# Patient Record
Sex: Female | Born: 1955
Health system: Southern US, Community
[De-identification: ages and names within clinical notes are randomized; demographics above are authoritative.]

## PROBLEM LIST (undated history)

## (undated) DIAGNOSIS — M329 Systemic lupus erythematosus, unspecified: Secondary | ICD-10-CM

## (undated) DIAGNOSIS — K219 Gastro-esophageal reflux disease without esophagitis: Secondary | ICD-10-CM

## (undated) DIAGNOSIS — Z8669 Personal history of other diseases of the nervous system and sense organs: Secondary | ICD-10-CM

## (undated) DIAGNOSIS — T753XXA Motion sickness, initial encounter: Secondary | ICD-10-CM

## (undated) DIAGNOSIS — K754 Autoimmune hepatitis: Secondary | ICD-10-CM

## (undated) DIAGNOSIS — E559 Vitamin D deficiency, unspecified: Secondary | ICD-10-CM

## (undated) DIAGNOSIS — M1990 Unspecified osteoarthritis, unspecified site: Secondary | ICD-10-CM

## (undated) DIAGNOSIS — N938 Other specified abnormal uterine and vaginal bleeding: Secondary | ICD-10-CM

## (undated) DIAGNOSIS — J45909 Unspecified asthma, uncomplicated: Secondary | ICD-10-CM

## (undated) DIAGNOSIS — R001 Bradycardia, unspecified: Secondary | ICD-10-CM

## (undated) DIAGNOSIS — F32A Depression, unspecified: Secondary | ICD-10-CM

## (undated) DIAGNOSIS — E119 Type 2 diabetes mellitus without complications: Secondary | ICD-10-CM

## (undated) DIAGNOSIS — K8020 Calculus of gallbladder without cholecystitis without obstruction: Secondary | ICD-10-CM

## (undated) DIAGNOSIS — Z8619 Personal history of other infectious and parasitic diseases: Secondary | ICD-10-CM

## (undated) DIAGNOSIS — K769 Liver disease, unspecified: Secondary | ICD-10-CM

## (undated) DIAGNOSIS — Z201 Contact with and (suspected) exposure to tuberculosis: Secondary | ICD-10-CM

## (undated) DIAGNOSIS — F329 Major depressive disorder, single episode, unspecified: Secondary | ICD-10-CM

## (undated) DIAGNOSIS — I420 Dilated cardiomyopathy: Secondary | ICD-10-CM

## (undated) DIAGNOSIS — K259 Gastric ulcer, unspecified as acute or chronic, without hemorrhage or perforation: Secondary | ICD-10-CM

## (undated) DIAGNOSIS — I5020 Unspecified systolic (congestive) heart failure: Secondary | ICD-10-CM

## (undated) DIAGNOSIS — I4891 Unspecified atrial fibrillation: Secondary | ICD-10-CM

## (undated) HISTORY — DX: Dilated cardiomyopathy: I42.0

## (undated) HISTORY — DX: Major depressive disorder, single episode, unspecified: F32.9

## (undated) HISTORY — DX: Unspecified asthma, uncomplicated: J45.909

## (undated) HISTORY — DX: Unspecified osteoarthritis, unspecified site: M19.90

## (undated) HISTORY — DX: Unspecified systolic (congestive) heart failure: I50.20

## (undated) HISTORY — DX: Depression, unspecified: F32.A

## (undated) HISTORY — DX: Bradycardia, unspecified: R00.1

## (undated) HISTORY — DX: Gastro-esophageal reflux disease without esophagitis: K21.9

## (undated) HISTORY — DX: Personal history of other infectious and parasitic diseases: Z86.19

## (undated) HISTORY — DX: Type 2 diabetes mellitus without complications: E11.9

## (undated) HISTORY — DX: Liver disease, unspecified: K76.9

## (undated) HISTORY — DX: Calculus of gallbladder without cholecystitis without obstruction: K80.20

## (undated) HISTORY — DX: Unspecified atrial fibrillation: I48.91

## (undated) HISTORY — PX: BACK SURGERY: SHX140

## (undated) HISTORY — DX: Contact with and (suspected) exposure to tuberculosis: Z20.1

## (undated) HISTORY — DX: Personal history of other diseases of the nervous system and sense organs: Z86.69

## (undated) HISTORY — DX: Vitamin D deficiency, unspecified: E55.9

## (undated) HISTORY — DX: Other specified abnormal uterine and vaginal bleeding: N93.8

## (undated) HISTORY — DX: Gastric ulcer, unspecified as acute or chronic, without hemorrhage or perforation: K25.9

## (undated) HISTORY — DX: Autoimmune hepatitis: K75.4

## (undated) HISTORY — DX: Systemic lupus erythematosus, unspecified: M32.9

## (undated) HISTORY — PX: ABDOMINAL HYSTERECTOMY: SHX81

## (undated) HISTORY — PX: LAPAROSCOPIC HYSTERECTOMY: SHX1926

## (undated) HISTORY — PX: TUBAL LIGATION: SHX77

---

## 2003-05-13 DIAGNOSIS — Z201 Contact with and (suspected) exposure to tuberculosis: Secondary | ICD-10-CM

## 2003-05-13 HISTORY — DX: Contact with and (suspected) exposure to tuberculosis: Z20.1

## 2008-10-31 ENCOUNTER — Ambulatory Visit: Payer: Self-pay | Admitting: Gastroenterology

## 2009-05-26 ENCOUNTER — Ambulatory Visit: Payer: Self-pay | Admitting: Internal Medicine

## 2009-06-21 ENCOUNTER — Ambulatory Visit: Payer: Self-pay | Admitting: Internal Medicine

## 2009-11-22 ENCOUNTER — Encounter: Payer: Self-pay | Admitting: Family Medicine

## 2009-12-10 ENCOUNTER — Encounter: Payer: Self-pay | Admitting: Family Medicine

## 2010-03-19 DIAGNOSIS — K754 Autoimmune hepatitis: Secondary | ICD-10-CM | POA: Insufficient documentation

## 2010-03-19 LAB — HM HEPATITIS C SCREENING LAB: HM Hepatitis Screen: NEGATIVE

## 2010-05-06 ENCOUNTER — Emergency Department: Payer: Self-pay | Admitting: Emergency Medicine

## 2010-05-07 DIAGNOSIS — K7682 Hepatic encephalopathy: Secondary | ICD-10-CM | POA: Insufficient documentation

## 2010-05-22 ENCOUNTER — Ambulatory Visit: Admit: 2010-05-22 | Payer: Self-pay | Admitting: Family Medicine

## 2010-05-31 ENCOUNTER — Ambulatory Visit
Admission: RE | Admit: 2010-05-31 | Discharge: 2010-05-31 | Payer: Self-pay | Source: Home / Self Care | Attending: Family Medicine | Admitting: Family Medicine

## 2010-05-31 ENCOUNTER — Encounter: Payer: Self-pay | Admitting: Family Medicine

## 2010-05-31 DIAGNOSIS — K754 Autoimmune hepatitis: Secondary | ICD-10-CM | POA: Insufficient documentation

## 2010-05-31 DIAGNOSIS — E169 Disorder of pancreatic internal secretion, unspecified: Secondary | ICD-10-CM | POA: Insufficient documentation

## 2010-05-31 DIAGNOSIS — M329 Systemic lupus erythematosus, unspecified: Secondary | ICD-10-CM | POA: Insufficient documentation

## 2010-05-31 DIAGNOSIS — E559 Vitamin D deficiency, unspecified: Secondary | ICD-10-CM | POA: Insufficient documentation

## 2010-05-31 DIAGNOSIS — E099 Drug or chemical induced diabetes mellitus without complications: Secondary | ICD-10-CM | POA: Insufficient documentation

## 2010-05-31 DIAGNOSIS — T380X5A Adverse effect of glucocorticoids and synthetic analogues, initial encounter: Secondary | ICD-10-CM

## 2010-05-31 HISTORY — DX: Systemic lupus erythematosus, unspecified: M32.9

## 2010-06-03 LAB — CONVERTED CEMR LAB
ALT: 32 units/L (ref 0–35)
AST: 35 units/L (ref 0–37)
Albumin: 4 g/dL (ref 3.5–5.2)
Alkaline Phosphatase: 59 units/L (ref 39–117)
BUN: 14 mg/dL (ref 6–23)
Bilirubin, Direct: 0.1 mg/dL (ref 0.0–0.3)
CO2: 25 meq/L (ref 19–32)
Calcium: 9.5 mg/dL (ref 8.4–10.5)
Chloride: 103 meq/L (ref 96–112)
Creatinine, Ser: 0.69 mg/dL (ref 0.40–1.20)
Glucose, Bld: 123 mg/dL — ABNORMAL HIGH (ref 70–99)
Hgb A1c MFr Bld: 10.9 % — ABNORMAL HIGH (ref <5.7)
Indirect Bilirubin: 0.4 mg/dL (ref 0.0–0.9)
Potassium: 3.8 meq/L (ref 3.5–5.3)
Sodium: 139 meq/L (ref 135–145)
Total Bilirubin: 0.5 mg/dL (ref 0.3–1.2)
Total Protein: 7.8 g/dL (ref 6.0–8.3)
Vit D, 25-Hydroxy: 27 ng/mL — ABNORMAL LOW (ref 30–89)

## 2010-06-07 ENCOUNTER — Encounter (INDEPENDENT_AMBULATORY_CARE_PROVIDER_SITE_OTHER): Payer: Self-pay | Admitting: *Deleted

## 2010-06-13 NOTE — Miscellaneous (Signed)
Summary: med list update- test strips  Clinical Lists Changes  Medications: Added new medication of BAYER CONTOUR TEST  STRP (GLUCOSE BLOOD) check blood sugar twice a day     Prior Medications: IMURAN 50 MG TABS (AZATHIOPRINE) 200mg  daily ALDACTONE 50 MG TABS (SPIRONOLACTONE) take one tablet by mouth daily NEOMYCIN SULFATE 500 MG TABS (NEOMYCIN SULFATE) take one tablet by mouth daily LACTULOSE 20 GM/30ML SOLN (LACTULOSE) take four times daily NEXIUM 20 MG CPDR (ESOMEPRAZOLE MAGNESIUM) take one tablet by mouth daily LANTUS 100 UNIT/ML SOLN (INSULIN GLARGINE) 22 units every morning sliding scale  150-200     0 units 201-250     2 units 251-300     6 units 301-350     8 units 351-400     10 units 401 and above    Call MD PREDNISONE 10 MG TABS (PREDNISONE) 1 tab by mouth daily. VITAMIN D3 50000 UNIT CAPS (CHOLECALCIFEROL) take one tablet by mouth once a week for six weeks BAYER CONTOUR TEST  STRP (GLUCOSE BLOOD) check blood sugar twice a day Current Allergies: No known allergies

## 2010-06-13 NOTE — Assessment & Plan Note (Signed)
Summary: NEW PT TO BE ESTABLISHED/JRR   Vital Signs:  Patient profile:   55 year old female Height:      66 inches Weight:      194.50 pounds BMI:     31.51 Temp:     99.2 degrees F oral Pulse rate:   78 / minute Pulse rhythm:   regular BP sitting:   110 / 70  (right arm) Cuff size:   regular  Vitals Entered By: Linde Gillis CMA Duncan Dull) (May 31, 2010 2:48 PM) CC: new patient, establish care   History of Present Illness: 55 yo with complicated medical history here to establish care.  1.  Autoimmune hepatitis- diagnosed 12 years ago, followed by [facility] liver clinic.  On transplant list. Per pt, liver function has been deteriorating so her dose of Imuran was recently increased to 200 mg daily, also taking Aldoactone 50 mg daily, Lactulose and now on Prednisone 10 mg daily which has caused steroid induced DM.  2.  Several weeks ago was at work, increased urination, thrush in her mouth and increased confusion.  CBG was greater than 500.  Went to [facility] hospitals and diagnosed with steroid induced DM. Currently on 22 units of Lantus every morning and SSI. CBGs have been under 150 for past several days and she feels much better.  3.  Vitamin d defiency- recently completed one month of high dose Vit D, feels like energy prior to the DM was better on Vitamin D.  Due for labs.  Preventive Screening-Counseling & Management  Alcohol-Tobacco     Smoking Status: current      Drug Use:  no.    Current Medications (verified): 1)  Imuran 50 Mg Tabs (Azathioprine) .... 200mg  Daily 2)  Aldactone 50 Mg Tabs (Spironolactone) .... Take One Tablet By Mouth Daily 3)  Neomycin Sulfate 500 Mg Tabs (Neomycin Sulfate) .... Take One Tablet By Mouth Daily 4)  Lactulose 20 Gm/109ml Soln (Lactulose) .... Take Four Times Daily 5)  Nexium 20 Mg Cpdr (Esomeprazole Magnesium) .... Take One Tablet By Mouth Daily 6)  Lantus 100 Unit/ml Soln (Insulin Glargine) .... 22 Units Every Morning Sliding Scale  150-200      0 Units 201-250     2 Units 251-300     6 Units 301-350     8 Units 351-400     10 Units 401 and Above    Call Md 7)  Prednisone 10 Mg Tabs (Prednisone) .Marland Kitchen.. 1 Tab By Mouth Daily.  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 Brooke Dare at [facility] is hepatologist)- on transplant list. 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 Eyes:  Denies blurring. ENT:  Denies difficulty swallowing. CV:  Denies chest pain or discomfort. Resp:  Denies shortness of breath. GI:  Denies abdominal pain and change in bowel habits. GU:  Complains of urinary frequency. MS:  Complains of joint pain. Derm:  Denies rash. Neuro:  Denies headaches and seizures. Psych:  Denies anxiety and depression. Endo:  Denies cold intolerance and heat intolerance. Heme:  Denies abnormal bruising and bleeding.   Impression & Recommendations:  Problem # 1:  DIABETES MELLITUS, STEROID-INDUCED (ICD-251.8) Assessment New CBGs appear under better control.  Will check an a1c today. Orders: T- Hemoglobin A1C ([id number])  Problem # 2:  AUTOIMMUNE HEPATITIS (ICD-571.42) Assessment: Deteriorated Very complicated situation.  We would ultimately like to decrease her IMuran and get her off the prednisone so she can reverse her DM. Will order labs per pt request and send to her hepatologist.  Also awaiting records from  them. Orders: Venipuncture (21308) Specimen Handling (65784) T-Comprehensive Metabolic Panel ([id number]) T-Hepatic Function (32440-10272)  Problem # 3:  VITAMIN D DEFICIENCY (ICD-268.9) Assessment: Unchanged recheck Vit D today. Orders: T- * Misc. Laboratory test ([phone])  Complete Medication List: 1)  Imuran 50 Mg Tabs (Azathioprine) .... 200mg  daily 2)  Aldactone 50 Mg Tabs (Spironolactone) .... Take one tablet by mouth daily 3)  Neomycin Sulfate 500 Mg Tabs (Neomycin sulfate) .... Take one tablet by mouth daily 4)  Lactulose 20 Gm/12ml Soln (Lactulose) .... Take four times daily 5)  Nexium 20 Mg Cpdr (Esomeprazole magnesium) .... Take one tablet by mouth daily 6)  Lantus 100 Unit/ml Soln (Insulin glargine) .... 22 units every morning sliding scale  150-200     0 units 201-250     2 units 251-300     6 units 301-350     8 units 351-400     10 units 401 and above    call md 7)  Prednisone 10 Mg Tabs (Prednisone) .Marland Kitchen.. 1 tab by mouth daily.   Orders Added: 1)  Venipuncture [40347] 2)  Specimen Handling [99000] 3)  T-Comprehensive Metabolic Panel [[id number]] 4)  T-Hepatic Function [80076-22960] 5)  T- Hemoglobin A1C [83036-23375] 6)  T- * Misc. Laboratory test [phone] 7)  New Patient Level IV [99204]    Current Allergies (reviewed today): No known allergies   TD Result Date:  05/16/2003 TD Result:  historical Flex Sig Next Due:  Not Indicated Colonoscopy Result Date:  05/23/2008 Colonoscopy Result:  historical Hemoccult Next Due:  Not Indicated

## 2010-07-03 ENCOUNTER — Ambulatory Visit (INDEPENDENT_AMBULATORY_CARE_PROVIDER_SITE_OTHER): Payer: PRIVATE HEALTH INSURANCE | Admitting: Family Medicine

## 2010-07-03 ENCOUNTER — Encounter: Payer: Self-pay | Admitting: Family Medicine

## 2010-07-03 DIAGNOSIS — J0110 Acute frontal sinusitis, unspecified: Secondary | ICD-10-CM | POA: Insufficient documentation

## 2010-07-09 NOTE — Assessment & Plan Note (Signed)
Summary: ? SINUS INFECTION   Vital Signs:  Patient profile:   55 year old female Height:      66 inches Weight:      196.75 pounds BMI:     31.87 Temp:     98.9 degrees F oral Pulse rate:   85 / minute Pulse rhythm:   regular BP sitting:   120 / 80  (right arm) Cuff size:   regular  Vitals Entered By: Linde Gillis CMA Duncan Dull) (July 03, 2010 11:37 AM) CC: ? sinus infection   History of Present Illness: 55 yo with complicated medical history here for ?sinus infection.  H/o Autoimmune hepatitis- diagnosed 12 years ago, followed by [facility] liver clinic.  On transplant list. Per pt, liver function has been deteriorating so her dose of Imuran was recently increased to 200 mg daily, also taking Aldoactone 50 mg daily, Lactulose and prednisone recently decreased to 7.5 mg daily.    Steroid induced DM, has not need insulin in 5 days.  For past week, increasing URI symptoms.  Started with runny nose, dry cough.  Taking OTC decongestants.  Now has sinus pressure, chills, malaise. No fever.  Current Medications (verified): 1)  Imuran 50 Mg Tabs (Azathioprine) .... 200mg  Daily 2)  Aldactone 50 Mg Tabs (Spironolactone) .... Take One Tablet By Mouth Daily 3)  Neomycin Sulfate 500 Mg Tabs (Neomycin Sulfate) .... Take One Tablet By Mouth Daily 4)  Lactulose 20 Gm/75ml Soln (Lactulose) .... Take Four Times Daily 5)  Lantus 100 Unit/ml Soln (Insulin Glargine) .... 22 Units Every Morning Sliding Scale  150-200     0 Units 201-250     2 Units 251-300     6 Units 301-350     8 Units 351-400     10 Units 401 and Above    Call Md 6)  Prednisone 5 Mg Tabs (Prednisone) .... Take 7.5mg  Daily 7)  Vitamin D3 50000 Unit Caps (Cholecalciferol) .... Take One Tablet By Mouth Once A Week For Six Weeks 8)  Bayer Contour Test  Strp (Glucose Blood) .... Check Blood Sugar Twice A Day 9)  Prednisone 2.5 Mg Tabs (Prednisone) .... Take 7.5mg  Daily 10)  Ativan 0.5 Mg Tabs (Lorazepam) .... 1/2 Tab By Mouth Three Times  A Day As Needed Anxiety 11)  Azithromycin 250 Mg  Tabs (Azithromycin) .... 2 By  Mouth Today and Then 1 Daily For 4 Days  Allergies (verified): No Known Drug Allergies  Past History:  Past Medical History: Last updated: 05/31/2010 autoimmune hepatitis ( Dr. Brooke [facility]are at [facility] is hepatologist)- on transplant list. 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 Eyes:  Denies blurring. ENT:  Complains of nasal congestion, sinus pressure, and sore throat. CV:  Denies chest pain or discomfort. Resp:  Complains of cough; denies shortness of breath, sputum productive, and wheezing.  Physical Exam  General:  alert, well-developed, and well-nourished.   VSS non toxic appearing Nose:  mucosal erythema.    Mouth:  pharyngeal erythema.   +PND Lungs:  normal respiratory effort, no intercostal retractions, no accessory muscle use, and normal breath sounds.   Heart:  normal rate and regular  rhythm.   Psych:  Cognition and judgment appear intact. Alert and cooperative with normal attention span and concentration. No apparent delusions, illusions, hallucinations   Impression & Recommendations:  Problem # 1:  ACUTE FRONTAL SINUSITIS (ICD-461.1) Assessment New Given duration and progression of symptoms, will treat with Zpack for bacterial sinusitis. Discussed supportive care, limited by her liver disease. Pt knows to avoid tylenol. Her updated medication list for this problem includes:    Neomycin Sulfate 500 Mg Tabs (Neomycin sulfate) .Marland Kitchen... Take one tablet by mouth daily    Azithromycin 250 Mg  Tabs (Azithromycin) .Marland Kitchen... 2 by  mouth today and then 1 daily for 4 days  Complete Medication List: 1)  Imuran 50 Mg Tabs (Azathioprine) .... 200mg  daily 2)  Aldactone 50 Mg Tabs (Spironolactone) .... Take one tablet by mouth daily 3)  Neomycin Sulfate 500 Mg Tabs (Neomycin sulfate) .... Take one tablet by mouth daily 4)  Lactulose 20 Gm/55ml Soln (Lactulose) .... Take four times daily 5)  Lantus 100 Unit/ml Soln (Insulin glargine) .... 22 units every morning sliding scale  150-200     0 units 201-250     2 units 251-300     6 units 301-350     8 units 351-400     10 units 401 and above    call md 6)  Prednisone 5 Mg Tabs (Prednisone) .... Take 7.5mg  daily 7)  Vitamin D3 50000 Unit Caps (Cholecalciferol) .... Take one tablet by mouth once a week for six weeks 8)  Bayer Contour Test Strp (Glucose blood) .... Check blood sugar twice a day 9)  Prednisone 2.5 Mg Tabs (Prednisone) .... Take 7.5mg  daily 10)  Ativan 0.5 Mg Tabs (Lorazepam) .... 1/2 tab by mouth three times a day as needed anxiety 11)  Azithromycin 250 Mg Tabs (Azithromycin) .... 2 by  mouth today and then 1 daily for 4 days Prescriptions: AZITHROMYCIN 250 MG  TABS (AZITHROMYCIN) 2 by  mouth today and then 1 daily for 4 days  #6 x 0   Entered and Authorized by:   Ruthe Mannan MD   Signed by:   Ruthe Mannan MD on 07/03/2010   Method used:   Print then Give to Patient   RxID:   [id number] ATIVAN 0.5 MG TABS (LORAZEPAM) 1/2 tab by mouth three times a day as needed anxiety  #60 x 0   Entered and Authorized by:   Ruthe Mannan MD   Signed by:   Ruthe Mannan MD on 07/03/2010   Method used:   Print then Give to Patient   RxID:   [id number]    Orders Added: 1)  Est. Patient Level IV [01027]    Current Allergies (reviewed today): No known allergies

## 2010-08-14 ENCOUNTER — Encounter: Payer: Self-pay | Admitting: Family Medicine

## 2010-11-04 ENCOUNTER — Ambulatory Visit (INDEPENDENT_AMBULATORY_CARE_PROVIDER_SITE_OTHER): Payer: Medicaid Other | Admitting: Family Medicine

## 2010-11-04 VITALS — BP 110/72 | HR 67 | Temp 98.5°F | Wt 203.5 lb

## 2010-11-04 DIAGNOSIS — L819 Disorder of pigmentation, unspecified: Secondary | ICD-10-CM

## 2010-11-04 DIAGNOSIS — F4320 Adjustment disorder, unspecified: Secondary | ICD-10-CM | POA: Insufficient documentation

## 2010-11-04 DIAGNOSIS — K754 Autoimmune hepatitis: Secondary | ICD-10-CM

## 2010-11-04 DIAGNOSIS — Z23 Encounter for immunization: Secondary | ICD-10-CM

## 2010-11-04 HISTORY — DX: Adjustment disorder, unspecified: F43.20

## 2010-11-04 LAB — HEPATIC FUNCTION PANEL
ALT: 23 U/L (ref 0–35)
AST: 27 U/L (ref 0–37)
Albumin: 4 g/dL (ref 3.5–5.2)
Alkaline Phosphatase: 65 U/L (ref 39–117)
Bilirubin, Direct: 0.1 mg/dL (ref 0.0–0.3)
Total Bilirubin: 0.4 mg/dL (ref 0.3–1.2)
Total Protein: 7.4 g/dL (ref 6.0–8.3)

## 2010-11-04 MED ORDER — NORTRIPTYLINE HCL 10 MG PO CAPS: ORAL_CAPSULE | ORAL | Status: DC

## 2010-11-04 MED ORDER — PANTOPRAZOLE SODIUM 40 MG PO TBEC: 40.0000 mg | DELAYED_RELEASE_TABLET | Freq: Every day | ORAL | Status: DC

## 2010-11-04 MED ORDER — NORTRIPTYLINE HCL 10 MG PO CAPS: 50.0000 mg | ORAL_CAPSULE | Freq: Every day | ORAL | Status: DC

## 2010-11-04 NOTE — Progress Notes (Signed)
55 yo with complicated medical history here for:  1.  Depression- since she quit her job, she has been a little more depressed.  No SI or HI but feels low. Having problems sleeping, going through menopause. Can fall asleep but wakes up multiple times at night. Years ago, took nortriptyline, and felt it helped tremendously.  2.  Autoimmune hepatitis- diagnosed 12 years ago, followed by [facility] liver clinic.  On transplant list. Per pt, liver function has been deteriorating so her dose of Imuran was recently increased to 200 mg daily, also taking Aldoactone 50 mg daily, Lactulose and now on Prednisone 10 mg daily which has caused steroid induced DM. Now taking Prednisone 5 mg daily instead of 10 mg and liver function has been improving. Notes reviewed from [facility] hepatology clinic.   Review of Systems       See HPI Patient Active Problem List  Diagnoses  . DIABETES MELLITUS, STEROID-INDUCED  . VITAMIN D DEFICIENCY  . AUTOIMMUNE HEPATITIS  . SLE  . ACUTE FRONTAL SINUSITIS  . Hyperpigmentation   Physical exam: BP 110/72  Pulse 67  Temp(Src) 98.5 F (36.9 C) (Oral)  Wt 203 lb 8 oz (92.307 kg) Gen:  Alert, very pleasant, NAD Psych:  Good eye contact, not outward signs of anxiety or depression.  1. Autoimmune hepatitis   Stable, followed by [facility] hepatology clinic. Recheck LFTs today  2. Adjustment disorder  Discussed treatment options. She would prefer to retry nortriptyline again.  Advised that we need to watch her LFTs. The patient indicates understanding of these issues and agrees with the plan.

## 2010-11-04 NOTE — Patient Instructions (Signed)
Great to see you. Please stop by to see Cynthia on your way out.   

## 2011-01-20 ENCOUNTER — Telehealth: Payer: Self-pay | Admitting: *Deleted

## 2011-01-20 MED ORDER — AZITHROMYCIN 250 MG PO TABS: ORAL_TABLET | ORAL | Status: AC

## 2011-01-20 NOTE — Telephone Encounter (Signed)
Pt is requesting that you call her.  She says it's a personal matter and wouldn't give me any details.

## 2011-01-20 NOTE — Telephone Encounter (Signed)
URI- fever tmax 101.9. 10 days of symptoms. On pred and Imuran for autoimmune liver disease. Will start Zpack immediately. If symptoms do not improve or worsen, pt to come see me. 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

## 2011-01-31 ENCOUNTER — Ambulatory Visit: Payer: Self-pay | Admitting: Internal Medicine

## 2011-03-11 ENCOUNTER — Encounter: Payer: Self-pay | Admitting: Family Medicine

## 2011-03-11 ENCOUNTER — Ambulatory Visit (INDEPENDENT_AMBULATORY_CARE_PROVIDER_SITE_OTHER): Payer: Medicare Other | Admitting: Family Medicine

## 2011-03-11 VITALS — BP 120/82 | HR 74 | Temp 98.5°F | Ht 66.0 in | Wt 204.8 lb

## 2011-03-11 DIAGNOSIS — E168 Other specified disorders of pancreatic internal secretion: Secondary | ICD-10-CM

## 2011-03-11 DIAGNOSIS — K754 Autoimmune hepatitis: Secondary | ICD-10-CM

## 2011-03-11 DIAGNOSIS — M329 Systemic lupus erythematosus, unspecified: Secondary | ICD-10-CM

## 2011-03-11 DIAGNOSIS — Z23 Encounter for immunization: Secondary | ICD-10-CM

## 2011-03-11 DIAGNOSIS — E559 Vitamin D deficiency, unspecified: Secondary | ICD-10-CM

## 2011-03-11 LAB — BASIC METABOLIC PANEL
BUN: 12 mg/dL (ref 6–23)
CO2: 27 mEq/L (ref 19–32)
Calcium: 9.5 mg/dL (ref 8.4–10.5)
Chloride: 106 mEq/L (ref 96–112)
Creatinine, Ser: 0.8 mg/dL (ref 0.4–1.2)
GFR: 100.88 mL/min (ref >60.00)
Glucose, Bld: 95 mg/dL (ref 70–99)
Potassium: 3.8 mEq/L (ref 3.5–5.1)
Sodium: 140 mEq/L (ref 135–145)

## 2011-03-11 MED ORDER — IBUPROFEN 800 MG PO TABS: 800.0000 mg | ORAL_TABLET | Freq: Three times a day (TID) | ORAL | Status: AC | PRN

## 2011-03-11 NOTE — Progress Notes (Signed)
55 yo with complicated medical history here for:  1.  Vit D deficiency- h/o vit d deficiency in past requiring higher dose supplementation. Has been more fatigued lately with increased joint/muscle aches.  2.  Autoimmune hepatitis- diagnosed 12 years ago, followed by [facility] liver clinic.  On transplant list.  Liver function has been stable.  On Imuran  200 mg daily, also taking Aldoactone 50 mg daily, Lactulose and now on Prednisone 10 mg daily which has caused steroid induced DM. Now taking Prednisone 5 mg daily instead of 10 mg and liver function has been improving. She feels however that her confusion and encephalopathy are worsening.  Very spiritual person- ok with God's plan.  ROS: See HPI  Patient Active Problem List  Diagnoses  . DIABETES MELLITUS, STEROID-INDUCED  . VITAMIN D DEFICIENCY  . AUTOIMMUNE HEPATITIS  . SLE  . ACUTE FRONTAL SINUSITIS  . Hyperpigmentation  . Adjustment disorder   No past medical history on file. No past surgical history on file. History  Substance Use Topics  . Smoking status: Current Everyday Smoker  . Smokeless tobacco: Not on file  . Alcohol Use: Not on file   No family history on file. No Known Allergies Current Outpatient Prescriptions on File Prior to Visit  Medication Sig Dispense Refill  . azaTHIOprine (IMURAN) 50 MG tablet Take four tablets by mouth daily, 200mg        . LORazepam (ATIVAN) 0.5 MG tablet Take 1/2 tablet by mouth three times a day as needed       . neomycin (MYCIFRADIN) 500 MG tablet Take 500 mg by mouth 2 (two) times daily.        . nortriptyline (PAMELOR) 10 MG capsule Take 1 capsule at bedtime  30 capsule  3  . pantoprazole (PROTONIX) 40 MG tablet Take 1 tablet (40 mg total) by mouth daily.  30 tablet  1  . spironolactone (ALDACTONE) 50 MG tablet Take 50 mg by mouth daily.         The PMH, PSH, Social History, Family History, Medications, and allergies have been reviewed in [facility], and have been updated if  relevant.  Physical exam: BP 120/82  Pulse 74  Temp(Src) 98.5 F (36.9 C) (Oral)  Ht 5\' 6"  (1.676 m)  Wt 204 lb 12 oz (92.874 kg)  BMI 33.05 kg/m2  General:  Well-developed,well-nourished,in no acute distress; alert,appropriate and cooperative throughout examination Head:  normocephalic and atraumatic.   Eyes:  vision grossly intact, pupils equal, pupils round, and pupils reactive to light.   Ears:  R ear normal and L ear normal.   Nose:  no external deformity.   Mouth:  good dentition.   Neck:  No deformities, masses, or tenderness noted. Lungs:  Normal respiratory effort, chest expands symmetrically. Lungs are clear to auscultation, no crackles or wheezes. Heart:  Normal rate and regular rhythm. S1 and S2 normal without gallop, murmur, click, rub or other extra sounds. Abdomen:  Bowel sounds positive,abdomen soft and non-tender without masses, organomegaly or hernias noted. Msk:  No deformity or scoliosis noted of thoracic or lumbar spine.   Extremities:  No clubbing, cyanosis, edema, or deformity noted with normal full range of motion of all joints.   Neurologic:  alert & oriented X3 and gait normal.   Skin:  Intact without suspicious lesions or rashes Psych:  Cognition and judgment appear intact. Alert and cooperative with normal attention span and concentration. No apparent delusions, illusions, hallucinations  Assessment and Plan: 1. VITAMIN D DEFICIENCY  Recheck labs today. Vitamin D (25 hydroxy), Basic Metabolic Panel (BMET)  2. Autoimmune hepatitis  Liver function stable but per pt, feels encephalopathy worsening. Continue lactulose, on transplant list.    3. DIABETES MELLITUS, STEROID-INDUCED  Improved.

## 2011-03-11 NOTE — Patient Instructions (Signed)
Good to see you. Please come back for your pneumovax (nurse visit)

## 2011-03-12 LAB — VITAMIN D 25 HYDROXY (VIT D DEFICIENCY, FRACTURES): Vit D, 25-Hydroxy: 32 ng/mL (ref 30–89)

## 2011-04-12 ENCOUNTER — Other Ambulatory Visit: Payer: Self-pay | Admitting: Family Medicine

## 2011-10-08 ENCOUNTER — Ambulatory Visit (INDEPENDENT_AMBULATORY_CARE_PROVIDER_SITE_OTHER): Payer: Medicare Other | Admitting: Family Medicine

## 2011-10-08 ENCOUNTER — Encounter: Payer: Self-pay | Admitting: Family Medicine

## 2011-10-08 VITALS — BP 102/70 | HR 72 | Temp 98.1°F | Wt 207.0 lb

## 2011-10-08 DIAGNOSIS — E559 Vitamin D deficiency, unspecified: Secondary | ICD-10-CM

## 2011-10-08 DIAGNOSIS — B379 Candidiasis, unspecified: Secondary | ICD-10-CM | POA: Insufficient documentation

## 2011-10-08 DIAGNOSIS — B370 Candidal stomatitis: Secondary | ICD-10-CM

## 2011-10-08 DIAGNOSIS — K754 Autoimmune hepatitis: Secondary | ICD-10-CM

## 2011-10-08 LAB — COMPREHENSIVE METABOLIC PANEL
ALT: 23 U/L (ref 0–35)
AST: 31 U/L (ref 0–37)
Albumin: 3.9 g/dL (ref 3.5–5.2)
Alkaline Phosphatase: 66 U/L (ref 39–117)
BUN: 14 mg/dL (ref 6–23)
CO2: 29 mEq/L (ref 19–32)
Calcium: 9.5 mg/dL (ref 8.4–10.5)
Chloride: 105 mEq/L (ref 96–112)
Creatinine, Ser: 0.7 mg/dL (ref 0.4–1.2)
GFR: 120.26 mL/min (ref >60.00)
Glucose, Bld: 97 mg/dL (ref 70–99)
Potassium: 4.1 mEq/L (ref 3.5–5.1)
Sodium: 141 mEq/L (ref 135–145)
Total Bilirubin: 0.3 mg/dL (ref 0.3–1.2)
Total Protein: 7.8 g/dL (ref 6.0–8.3)

## 2011-10-08 MED ORDER — CELECOXIB 50 MG PO CAPS: 50.0000 mg | ORAL_CAPSULE | Freq: Two times a day (BID) | ORAL | Status: DC

## 2011-10-08 MED ORDER — NYSTATIN 100000 UNIT/ML MT SUSP: 500000.0000 [IU] | Freq: Four times a day (QID) | OROMUCOSAL | Status: AC

## 2011-10-08 MED ORDER — CLINDAMYCIN PHOS-BENZOYL PEROX 1-5 % EX GEL
Freq: Two times a day (BID) | CUTANEOUS | Status: DC
Start: 2011-10-08 — End: 2012-03-26

## 2011-10-08 NOTE — Patient Instructions (Addendum)
Good to see you. Use suspension as directed. Keep me posted with your symptoms. We are adding Celebrex- use with caution, given liver disease.

## 2011-10-08 NOTE — Progress Notes (Signed)
56 yo with h/o autoimmune hepatitis here for:  Sore throat. Finishing course of Zpack for bronchitis.  URI symptoms improving but now mouth and throat very sore, hurts to swallow.  No fevers or chills. No runny nose.  Otherwise, feels she is doing well.  ROS: See HPI  Patient Active Problem List  Diagnoses  . DIABETES MELLITUS, STEROID-INDUCED  . VITAMIN D DEFICIENCY  . AUTOIMMUNE HEPATITIS  . SLE  . ACUTE FRONTAL SINUSITIS  . Hyperpigmentation  . Adjustment disorder   No past medical history on file. No past surgical history on file. History  Substance Use Topics  . Smoking status: Current Everyday Smoker  . Smokeless tobacco: Not on file  . Alcohol Use: Not on file   No family history on file. No Known Allergies Current Outpatient Prescriptions on File Prior to Visit  Medication Sig Dispense Refill  . azaTHIOprine (IMURAN) 50 MG tablet Take four tablets by mouth daily, 200mg        . lactulose (CHRONULAC) 10 GM/15ML solution Take 30cc three to four times daily      . LORazepam (ATIVAN) 0.5 MG tablet Take 1/2 tablet by mouth three times a day as needed       . neomycin (MYCIFRADIN) 500 MG tablet Take 500 mg by mouth 2 (two) times daily.        . nortriptyline (PAMELOR) 10 MG capsule Take 1 capsule at bedtime  30 capsule  3  . pantoprazole (PROTONIX) 40 MG tablet TAKE 1 TABLET BY MOUTH DAILY  30 tablet  11  . predniSONE (DELTASONE) 2.5 MG tablet Take 2.5 mg by mouth daily.        Marland Kitchen spironolactone (ALDACTONE) 50 MG tablet Take 50 mg by mouth daily.         The PMH, PSH, Social History, Family History, Medications, and allergies have been reviewed in [facility], and have been updated if relevant.  Physical exam: BP 102/70  Pulse 72  Temp 98.1 F (36.7 C)  Wt 207 lb (93.895 kg)  General:  Well-developed,well-nourished,in no acute distress; alert,appropriate and cooperative throughout examination Head:  normocephalic and atraumatic.   Eyes:  vision grossly intact, pupils  equal, pupils round, and pupils reactive to light.   Ears:  R ear normal and L ear normal.   Nose:  no external deformity.   Mouth:  good dentition, white patches on tongue and bucal mucosa Neck:  No deformities, masses, or tenderness noted. Lungs:  Normal respiratory effort, chest expands symmetrically. Lungs are clear to auscultation, no crackles or wheezes. Heart:  Normal rate and regular rhythm. S1 and S2 normal without gallop, murmur, click, rub or other extra sounds. Abdomen:  Bowel sounds positive,abdomen soft and non-tender without masses, organomegaly or hernias noted. Msk:  No deformity or scoliosis noted of thoracic or lumbar spine.   Extremities:  No clubbing, cyanosis, edema, or deformity noted with normal full range of motion of all joints.   Neurologic:  alert & oriented X3 and gait normal.   Skin:  Intact without suspicious lesions or rashes Psych:  Cognition and judgment appear intact. Alert and cooperative with normal attention span and concentration. No apparent delusions, illusions, hallucinations  Assessment and Plan: 1. Thrush     New- s/p abx. Will treat with oral nystatin rinse since she does have hepatitis. The patient indicates understanding of these issues and agrees with the plan.

## 2011-10-09 ENCOUNTER — Telehealth: Payer: Self-pay | Admitting: Family Medicine

## 2011-10-09 LAB — VITAMIN D 25 HYDROXY (VIT D DEFICIENCY, FRACTURES): Vit D, 25-Hydroxy: 27 ng/mL — ABNORMAL LOW (ref 30–89)

## 2011-10-09 NOTE — Telephone Encounter (Signed)
Caller: Naveya/Patient; PCP: Ruthe Mannan (Nestor Ramp); Call regarding Returning Call To Dr. Clifton Custard s RN @ 1005-10/09/11; PLEASE GIVE HER A CALL BACK  CB#: [phone];

## 2011-10-09 NOTE — Telephone Encounter (Signed)
Called pt back, advised her regarding vitamin D intake.

## 2011-10-09 NOTE — Telephone Encounter (Signed)
Laurie, please see below.

## 2011-10-20 ENCOUNTER — Telehealth: Payer: Self-pay

## 2011-10-20 MED ORDER — CLOTRIMAZOLE 10 MG MT TROC: 10.0000 mg | Freq: Every day | OROMUCOSAL | Status: AC

## 2011-10-20 MED ORDER — NYSTATIN 100000 UNIT/ML MT SUSP: 500000.0000 [IU] | Freq: Four times a day (QID) | OROMUCOSAL | Status: AC

## 2011-10-20 NOTE — Telephone Encounter (Signed)
Clotrimazole lozenges sent to her pharmacy. These are first line, along with nystatin rinse for topical therapy of thrush.

## 2011-10-20 NOTE — Telephone Encounter (Signed)
Pt does want a refill on the nystatin.

## 2011-10-20 NOTE — Telephone Encounter (Signed)
We can refill the nystatin swish if she would like.  I am unaware of anything else as effective that can be swished. Topical lozenges actually work quite well. There are oral solutions and tablets that are swallowed.

## 2011-10-20 NOTE — Telephone Encounter (Signed)
Rx sent 

## 2011-10-20 NOTE — Telephone Encounter (Signed)
Advised pt.  She is asking if she can also have something that she can swish and swallow or spit out.  She says the amount of nystatin last time was only about 2 ounces, so, she says, it wasn't enough to do any good.

## 2011-10-20 NOTE — Telephone Encounter (Signed)
Pt seen 10/08/11, still lots of fluid in throat, S/T and spots in mouth, no fever. Pt finished oral Nystatin; while on med symptoms improved but after finished med symptoms returned. Pt wants different med to swish and spit out. [facility] [place].

## 2011-11-28 ENCOUNTER — Telehealth: Payer: Self-pay

## 2011-11-28 DIAGNOSIS — M549 Dorsalgia, unspecified: Secondary | ICD-10-CM

## 2011-11-28 NOTE — Telephone Encounter (Signed)
Pt left v/m pt had seen doctor [facility] in past for steroid injections for back problem; pt needs to go again and was told needed referral from PCP. Wants referral to [facility] practice Dr Lenora Boys fax # for referral [phone].Please advise.

## 2011-11-28 NOTE — Telephone Encounter (Signed)
Shirlee Limerick, what type of referral would this be? Sports med?

## 2011-12-01 NOTE — Telephone Encounter (Signed)
Dr Dayton Martes, Yes it will be a Sports Medicine referral. They need the diagnosis on your referral for what you are referring her for. We fax the form to Dr West Carbo at [phone], I will then call the patient and then she can call herself to schedule the appt.

## 2011-12-02 NOTE — Telephone Encounter (Signed)
Referral placed.

## 2011-12-21 ENCOUNTER — Other Ambulatory Visit: Payer: Self-pay | Admitting: Family Medicine

## 2011-12-22 IMAGING — CR DG CHEST 2V
1 series · 2 of 2 positions shown · non-contrast
Comparison: none

REASON FOR EXAM: cough
COMMENTS:   LMP: Post Hysterectomy

PROCEDURE:     MDR - MDR CHEST PA(OR AP) AND LATERAL  - January 31, 2011  [DATE]
RESULT:     The lungs are clear. The heart and pulmonary vessels are normal.
The bony and mediastinal structures are unremarkable. There is no effusion.
There is no pneumothorax or evidence of congestive failure.

[Series 1: view not recorded · 0.17mm/px · 2 of 2 slices shown]
[im 1/2]
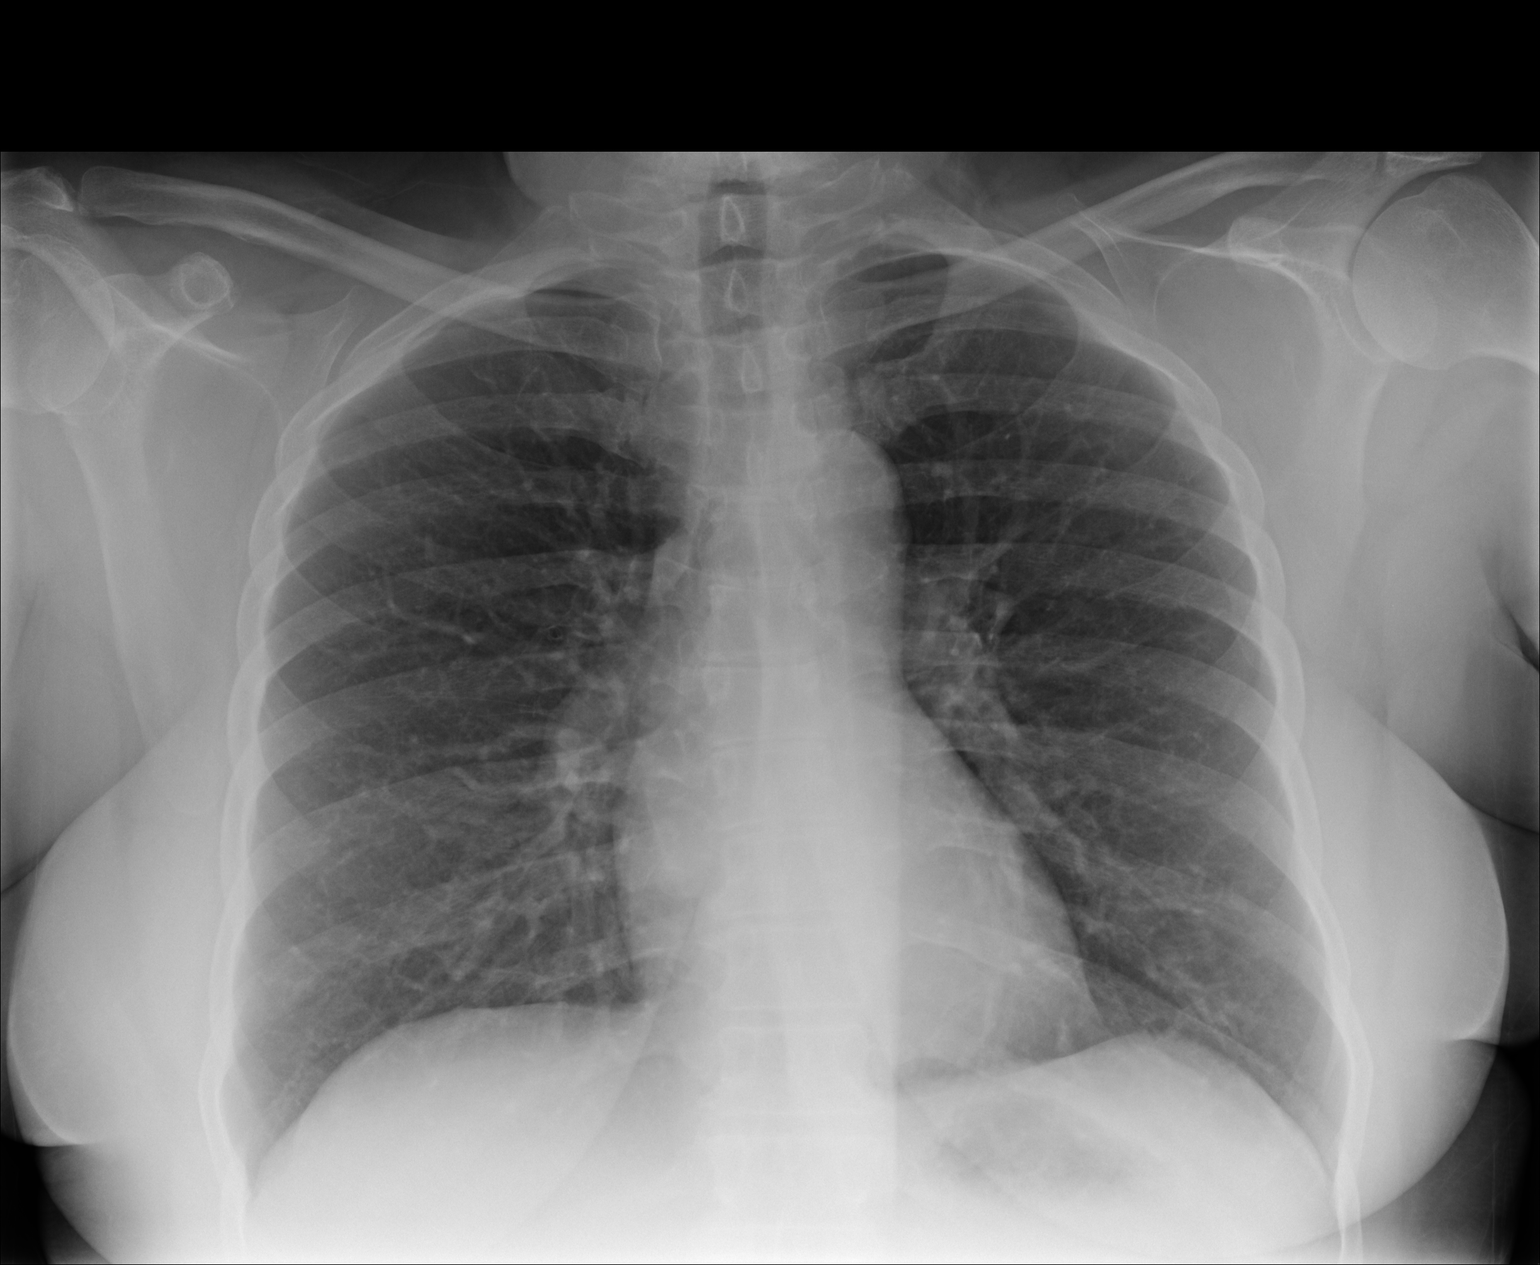
[im 2/2]
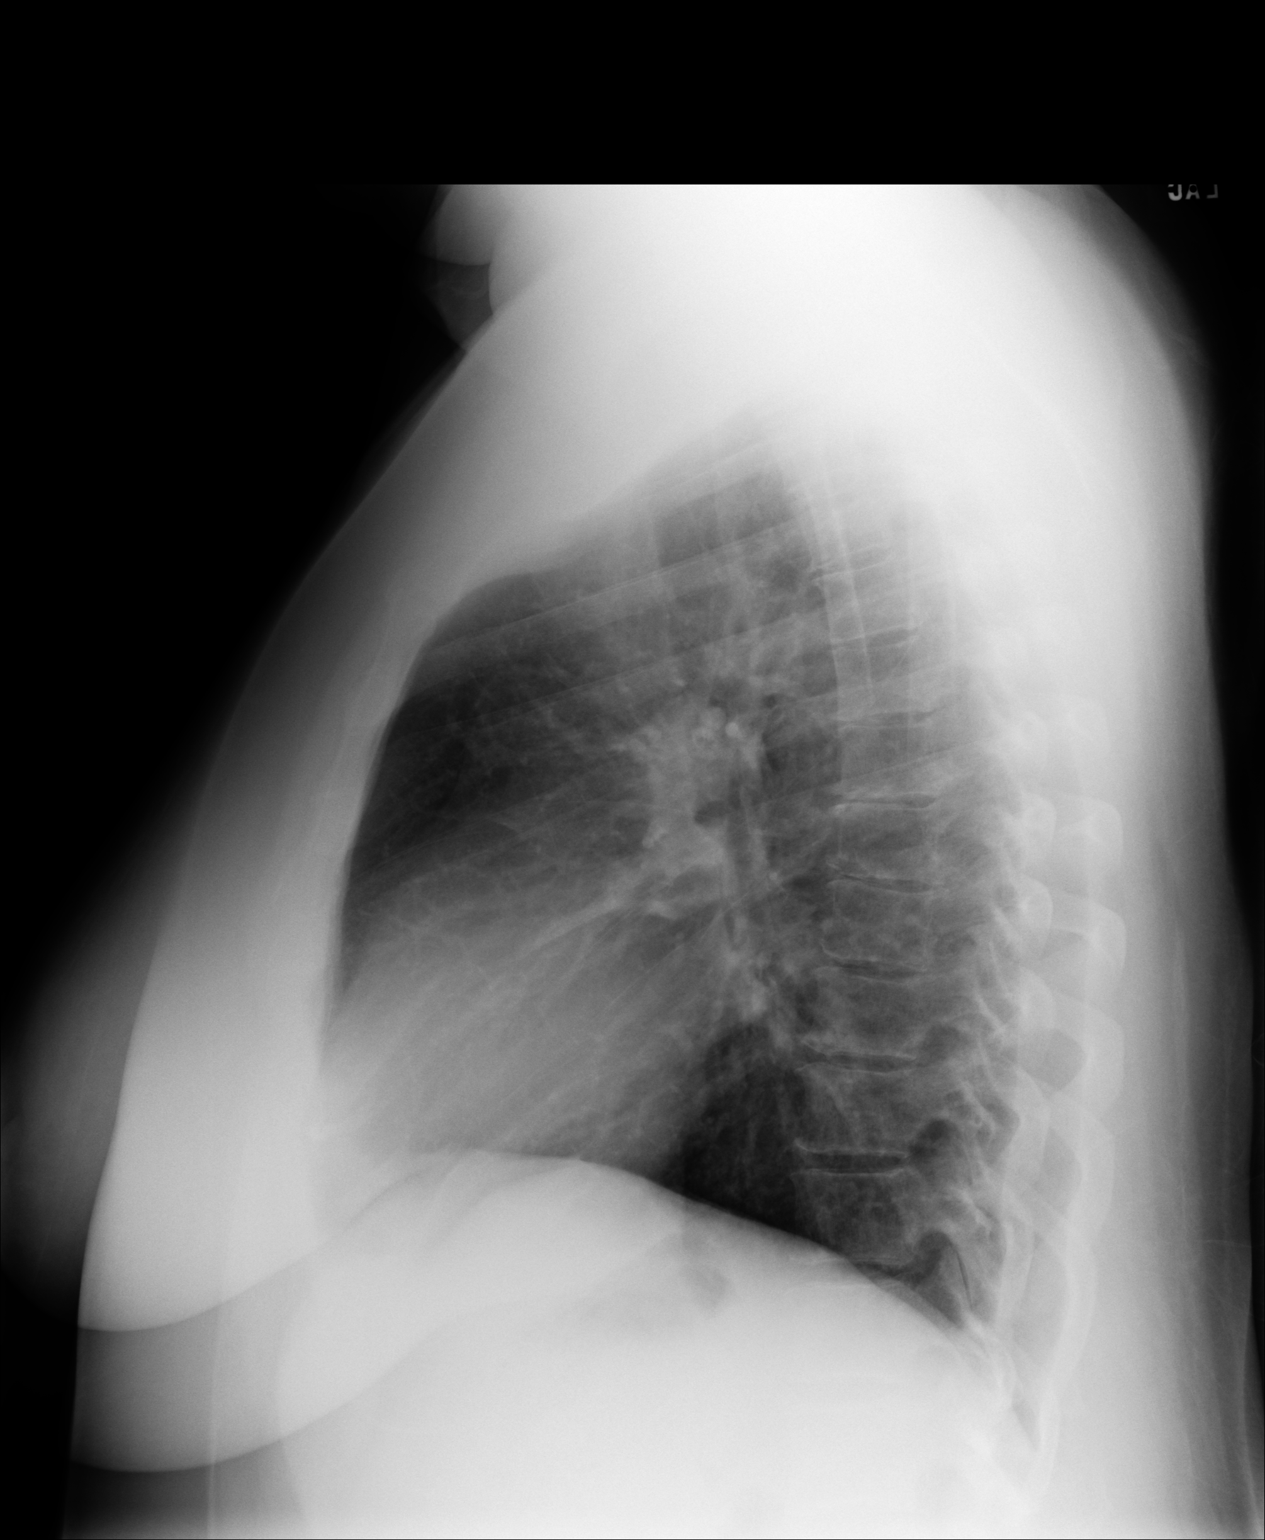

[2 of 2 positions shown; findings below may reference images not displayed]

IMPRESSION: No acute cardiopulmonary disease.

## 2012-03-05 ENCOUNTER — Other Ambulatory Visit: Payer: Self-pay | Admitting: Family Medicine

## 2012-03-05 DIAGNOSIS — M5414 Radiculopathy, thoracic region: Secondary | ICD-10-CM | POA: Insufficient documentation

## 2012-03-05 HISTORY — DX: Radiculopathy, thoracic region: M54.14

## 2012-03-26 ENCOUNTER — Ambulatory Visit (INDEPENDENT_AMBULATORY_CARE_PROVIDER_SITE_OTHER): Payer: Medicare Other | Admitting: Family Medicine

## 2012-03-26 ENCOUNTER — Encounter: Payer: Self-pay | Admitting: Family Medicine

## 2012-03-26 VITALS — BP 102/88 | HR 76 | Temp 98.1°F | Wt 208.0 lb

## 2012-03-26 DIAGNOSIS — M5417 Radiculopathy, lumbosacral region: Secondary | ICD-10-CM | POA: Insufficient documentation

## 2012-03-26 DIAGNOSIS — Z23 Encounter for immunization: Secondary | ICD-10-CM

## 2012-03-26 DIAGNOSIS — E559 Vitamin D deficiency, unspecified: Secondary | ICD-10-CM

## 2012-03-26 DIAGNOSIS — IMO0002 Reserved for concepts with insufficient information to code with codable children: Secondary | ICD-10-CM

## 2012-03-26 DIAGNOSIS — K754 Autoimmune hepatitis: Secondary | ICD-10-CM

## 2012-03-26 DIAGNOSIS — M1710 Unilateral primary osteoarthritis, unspecified knee: Secondary | ICD-10-CM

## 2012-03-26 DIAGNOSIS — M179 Osteoarthritis of knee, unspecified: Secondary | ICD-10-CM | POA: Insufficient documentation

## 2012-03-26 MED ORDER — CLINDAMYCIN PHOS-BENZOYL PEROX 1-5 % EX GEL: Freq: Two times a day (BID) | CUTANEOUS | Status: DC

## 2012-03-26 MED ORDER — IBUPROFEN 800 MG PO TABS: 800.0000 mg | ORAL_TABLET | Freq: Every day | ORAL | Status: DC

## 2012-03-26 NOTE — Progress Notes (Signed)
56 yo very pleasant female with a h/o autoimmune hepatitis followed by hepatic clinic here to discuss several issues.  Was seen at [facility] for L5 radicular pain.  Note from 03/10/12 reviewed.  Continues working with PT.  Epidural injection on 10/31- only mild improvement.  End stage OA in left knee- TKR was recommended.  She wants to avoid this for as long as possible.  Has tried injections.  Wears a knee brace.  Also has bursitis in her left hip.  Celebrex no longer helping.  Low dose Ibuprofen (800 mg per day) has been more helpful. She cannot take chronic pain medications due to her liver failure, although LFTs have been quite good.   Lab Results  Component Value Date   ALT 23 10/08/2011   AST 31 10/08/2011   ALKPHOS 66 10/08/2011   BILITOT 0.3 10/08/2011   Follow up scheduled with [facility] sports medicine on Monday but she has been disappointed with her results.  Patient Active Problem List  Diagnosis  . DIABETES MELLITUS, STEROID-INDUCED  . VITAMIN D DEFICIENCY  . AUTOIMMUNE HEPATITIS  . SLE  . ACUTE FRONTAL SINUSITIS  . Hyperpigmentation  . Adjustment disorder  . Thrush  . Lumbosacral radiculopathy at L5   No past medical history on file. No past surgical history on file. History  Substance Use Topics  . Smoking status: Current Every Day Smoker  . Smokeless tobacco: Not on file  . Alcohol Use: Not on file   No family history on file. No Known Allergies Current Outpatient Prescriptions on File Prior to Visit  Medication Sig Dispense Refill  . azaTHIOprine (IMURAN) 50 MG tablet Take four tablets by mouth daily, 200mg        . CELEBREX 50 MG capsule TAKE 1 TO 2 CAPSULES BY MOUTH ONCE DAILY  60 capsule  0  . clindamycin-benzoyl peroxide (BENZACLIN) gel Apply topically 2 (two) times daily.  25 g  0  . lactulose (CHRONULAC) 10 GM/15ML solution Take 30cc three to four times daily      . LORazepam (ATIVAN) 0.5 MG tablet Take 1/2 tablet by mouth three times a day as needed       .  neomycin (MYCIFRADIN) 500 MG tablet Take 500 mg by mouth 2 (two) times daily.        . nortriptyline (PAMELOR) 10 MG capsule Take 1 capsule at bedtime  30 capsule  3  . pantoprazole (PROTONIX) 40 MG tablet TAKE 1 TABLET BY MOUTH DAILY  30 tablet  11  . predniSONE (DELTASONE) 2.5 MG tablet Take 2.5 mg by mouth daily.        Marland Kitchen PROVENTIL HFA 108 (90 BASE) MCG/ACT inhaler INHALE 2 PUFFS AS DIRECTED 60 SECONDS APART  6.7 each  6  . spironolactone (ALDACTONE) 50 MG tablet Take 50 mg by mouth daily.         The PMH, PSH, Social History, Family History, Medications, and allergies have been reviewed in [facility], and have been updated if relevant.  Physical exam: BP 102/88  Pulse 76  Temp 98.1 F (36.7 C)  Wt 208 lb (94.348 kg)  General:  Well-developed,well-nourished,in no acute distress; alert,appropriate and cooperative throughout examination Head:  normocephalic and atraumatic.   Eyes:  vision grossly intact, pupils equal, pupils round, and pupils reactive to light.   Ears:  R ear normal and L ear normal.   Nose:  no external deformity.   Mouth:  good dentition.   Neck:  No deformities, masses, or tenderness noted. Lungs:  Normal respiratory effort, chest expands symmetrically. Lungs are clear to auscultation, no crackles or wheezes. Heart:  Normal rate and regular rhythm. S1 and S2 normal without gallop, murmur, click, rub or other extra sounds. Abdomen:  Bowel sounds positive,abdomen soft and non-tender without masses, organomegaly or hernias noted. Msk:  Knee brace on left knee, ataxic gait, very TTP over left trochanteric bursa and SI joint. Extremities:  No clubbing, cyanosis, edema, or deformity noted with normal full range of motion of all joints.   Neurologic:  alert & oriented X3 and gait normal.   Skin:  Intact without suspicious lesions or rashes Psych:  Cognition and judgment appear intact. Alert and cooperative with normal attention span and concentration. No apparent delusions,  illusions, hallucinations  Assessment and Plan:  1. Lumbosacral radiculopathy at L5   Has significant pain in hip, back and knee that is moderately relieved by Ibuprofen. I did recommend that she come see Dr. Patsy Lager for evaluation and treatment, particularly of her hip and knee.  The patient indicates understanding of these issues and agrees with the plan. She will continue to see the phyisican she has been seeing for epidural injections.   2. Autoimmune hepatitis  Has been stable. Recheck LFTs today. Hepatic function panel, Basic Metabolic Panel  3. Vitamin D deficiency  Vitamin D, 25-hydroxy  4. OA (osteoarthritis) of knee  See above.

## 2012-03-26 NOTE — Patient Instructions (Addendum)
Great to see you. Hang in there. Please make an appointment with Dr. Patsy Lager on your way out.

## 2012-03-26 NOTE — Addendum Note (Signed)
Addended by: Eliezer Bottom on: 03/26/2012 04:25 PM   Modules accepted: Orders

## 2012-03-27 LAB — HEPATIC FUNCTION PANEL
ALT: 18 U/L (ref 0–35)
AST: 26 U/L (ref 0–37)
Albumin: 4.3 g/dL (ref 3.5–5.2)
Alkaline Phosphatase: 72 U/L (ref 39–117)
Bilirubin, Direct: 0.1 mg/dL (ref 0.0–0.3)
Indirect Bilirubin: 0.3 mg/dL (ref 0.0–0.9)
Total Bilirubin: 0.4 mg/dL (ref 0.3–1.2)
Total Protein: 7.5 g/dL (ref 6.0–8.3)

## 2012-03-27 LAB — VITAMIN D 25 HYDROXY (VIT D DEFICIENCY, FRACTURES): Vit D, 25-Hydroxy: 34 ng/mL (ref 30–89)

## 2012-03-27 LAB — BASIC METABOLIC PANEL
BUN: 11 mg/dL (ref 6–23)
CO2: 26 mEq/L (ref 19–32)
Calcium: 9.7 mg/dL (ref 8.4–10.5)
Chloride: 105 mEq/L (ref 96–112)
Creat: 0.83 mg/dL (ref 0.50–1.10)
Glucose, Bld: 81 mg/dL (ref 70–99)
Potassium: 4.2 mEq/L (ref 3.5–5.3)
Sodium: 140 mEq/L (ref 135–145)

## 2012-03-29 ENCOUNTER — Encounter: Payer: Self-pay | Admitting: *Deleted

## 2012-03-31 ENCOUNTER — Ambulatory Visit (INDEPENDENT_AMBULATORY_CARE_PROVIDER_SITE_OTHER): Payer: Medicare Other | Admitting: Family Medicine

## 2012-03-31 ENCOUNTER — Encounter: Payer: Self-pay | Admitting: Family Medicine

## 2012-03-31 VITALS — BP 126/80 | HR 85 | Temp 98.2°F | Ht 66.0 in | Wt 210.8 lb

## 2012-03-31 DIAGNOSIS — M5416 Radiculopathy, lumbar region: Secondary | ICD-10-CM

## 2012-03-31 DIAGNOSIS — IMO0002 Reserved for concepts with insufficient information to code with codable children: Secondary | ICD-10-CM

## 2012-03-31 DIAGNOSIS — M5126 Other intervertebral disc displacement, lumbar region: Secondary | ICD-10-CM

## 2012-03-31 DIAGNOSIS — M76899 Other specified enthesopathies of unspecified lower limb, excluding foot: Secondary | ICD-10-CM

## 2012-03-31 DIAGNOSIS — M25569 Pain in unspecified knee: Secondary | ICD-10-CM

## 2012-03-31 DIAGNOSIS — M5116 Intervertebral disc disorders with radiculopathy, lumbar region: Secondary | ICD-10-CM

## 2012-03-31 DIAGNOSIS — M2390 Unspecified internal derangement of unspecified knee: Secondary | ICD-10-CM

## 2012-03-31 DIAGNOSIS — M7062 Trochanteric bursitis, left hip: Secondary | ICD-10-CM

## 2012-03-31 NOTE — Patient Instructions (Signed)
REFERRAL: GO THE THE FRONT ROOM AT THE ENTRANCE OF OUR CLINIC, NEAR CHECK IN. ASK FOR MARION. SHE WILL HELP YOU SET UP YOUR REFERRAL. DATE: TIME:    F/u 4-6 weeks

## 2012-03-31 NOTE — Progress Notes (Signed)
Nature conservation officer at [facility] [address] [place] [address] Phone: [phone] Fax: [phone]  Date:  03/31/2012   Name:  Shannon [name]   DOB:  1956/04/04   MRN:  [id number] Gender: female Age: 56 y.o.  PCP:  Ruthe Mannan, MD  Evaluating MD: Hannah Beat, MD   Chief Complaint: Knee Pain, Back Pain and Hip Pain   History of Present Illness:  Shannon [name] is a 56 y.o. pleasant patient who presents with the following:  Complex medical patient with a history of autoimmune hepatitis and liver failure on the transplant list at [facility], followed by [facility] hepatology. Most recent liver function studies are normal and the patient does not have altered coagulopathy.   She has been seeing [facility] orth[facility]ics for approximately 18 months to 2 years, and initially saw Dr. Scotty Court from [facility] orthopedics and sports medicine, and subsequently has seen some of the orthopedic surgical attendings as well as interventional spine practitioners for evaluation of chronic back pain and significant left-sided radicular symptoms. She also recently was given Synvisc 3, but it sounds as if the timing of those injections was off,  And she had her 2nd injection 3 weeks after her initial injection, and she was lost to followup for her 3rd injection. She had about 90% improved and after her 1st Synvisc injection.  Had an epidural injection 2 1/2 weeks ago. Now pain running down the back of leg. H/o L5-S1 decompression 20 years ago. Multiple bilging disks. Spondyloarthropathy. She has had an MRI of her lumbar spine with reportedly multiple bulging disc, but this is not available for my review. Records are pending I reviewed from [facility].  She also had a MRI of her LEFT knee. She tells me that she had some bursitis and some ligamentous disruption, but I do not have this for my own review.  Recent epidural steroid injection was effectively not helpful. She reports several years ago having what sounds  to be a combination of epidural steroid injection as well as facet injections which provided relief for approaching 2 years.   LEFT knee is having some intermittent swelling as well as restriction of motion and pain with with walking. Pain with rotational movements and bending. No mechanical buckling.  LEFT lateral hip at the trochanter is quite tender to palpation. She is not have any groin pain.  We will obtain records from the patient's prior physicians.   She has been diligent with PT and HEP  Patient Active Problem List  Diagnosis  . DIABETES MELLITUS, STEROID-INDUCED  . VITAMIN D DEFICIENCY  . AUTOIMMUNE HEPATITIS  . SLE  . ACUTE FRONTAL SINUSITIS  . Hyperpigmentation  . Adjustment disorder  . Thrush  . Lumbosacral radiculopathy at L5  . OA (osteoarthritis) of knee    No past medical history on file.  No past surgical history on file.  History  Substance Use Topics  . Smoking status: Current Every Day Smoker  . Smokeless tobacco: Not on file  . Alcohol Use: Not on file    No family history on file.  No Known Allergies  Medication list has been reviewed and updated.  Outpatient Prescriptions Prior to Visit  Medication Sig Dispense Refill  . azaTHIOprine (IMURAN) 50 MG tablet Take four tablets by mouth daily, 200mg        . clindamycin-benzoyl peroxide (BENZACLIN) gel Apply topically 2 (two) times daily.  25 g  0  . ibuprofen (ADVIL,MOTRIN) 800 MG tablet Take 1 tablet (800 mg  total) by mouth daily.  30 tablet  6  . lactulose (CHRONULAC) 10 GM/15ML solution Take 30cc three to four times daily      . LORazepam (ATIVAN) 0.5 MG tablet Take 1/2 tablet by mouth three times a day as needed       . neomycin (MYCIFRADIN) 500 MG tablet Take 500 mg by mouth 2 (two) times daily.        . pantoprazole (PROTONIX) 40 MG tablet TAKE 1 TABLET BY MOUTH DAILY  30 tablet  11  . spironolactone (ALDACTONE) 50 MG tablet Take 50 mg by mouth daily.        . predniSONE (DELTASONE) 2.5 MG  tablet Take 2.5 mg by mouth daily.         Last reviewed on 03/31/2012 11:07 AM by Consuello Masse, CMA  Review of Systems:   GEN: No fevers, chills. Nontoxic. Primarily MSK c/o today. MSK: Detailed in the HPI GI: tolerating PO intake without difficulty Neuro: as above Otherwise the pertinent positives of the ROS are noted above.    Physical Examination: Filed Vitals:   03/31/12 1107  BP: 126/80  Pulse: 85  Temp: 98.2 F (36.8 C)  TempSrc: Oral  Height: 5\' 6"  (1.676 m)  Weight: 210 lb 12.8 oz (95.618 kg)  SpO2: 99%    Body mass index is 34.02 kg/(m^2). Ideal Body Weight: Weight in (lb) to have BMI = 25: 154.6    GEN: WDWN, NAD, Non-toxic, Alert & Oriented x 3 HEENT: Atraumatic, Normocephalic.  Ears and Nose: No external deformity. EXTR: No clubbing/cyanosis/edema NEURO: antalgic gait  PSYCH: Normally interactive. Conversant. Not depressed or anxious appearing.  Calm demeanor.   Knee:  L ROM: 0-115 Effusion: mild Echymosis or edema: none Patellar tendon NT Painful PLICA: neg Patellar grind: pos Medial and lateral patellar facet loading: mildly TTP medial and lateral joint lines: TTP Mcmurray's pain Flexion-pinch pos Varus and valgus stress: stable Lachman: neg Ant and Post drawer: neg Hip abduction, IR, ER: WNL Hip flexion str: 5/5 Hip abd: 5/5 Quad: 4+/5 VMO atrophy: mod Hamstring concentric and eccentric: 5/5   HIP EXAM: SIDE: L ROM: Abduction, Flexion, Internal and External range of motion: full Pain with terminal IROM and EROM: none GTB: Marked L GTB SLR: NEG FABER: back pain REVERSE FABER: NT, neg Str: flexion: 5/5 abduction: 5/5 adduction: 5/5 Strength testing non-tender   Xrays, L knee: Not available for my independent review, but with review of you and see orthopedics notes, they noticed a mild tricompartmental changes only.  Assessment and Plan:  1. Trochanteric bursitis of left hip    2. Lumbar radiculopathy  Ambulatory referral  to Physical Medicine Rehab  3. Lumbar disc disease with radiculopathy  Ambulatory referral to Physical Medicine Rehab  4. Knee pain    5. Internal derangement of knee     1st, we need to obtain full records from [facility], and records from all imaging studies.  On my examination, all ligaments are stable. Clinical concern for potential meniscal tear. If she does have a meniscal tear, she has failed conservative management. Her liver function is stable, and I think that she would be a good arthroscopy candidate. Will review the MRI.  For now, to assist with acute pain management, will inject the knee with corticosteroid as well as trochanteric bursa injection. Hip strength is actually quite good. Suspect pain from the hip and back are all interrelated with gait abnormality playing some role.  Suspect true nerve impingement with failure of  one epidural steroid injection. Good response from what sounds to be prior epidural steroids and facet injections. The patient would like another opinion, and I think that that is a very reasonable good idea. We are going to ask Dr. Maurice Small his opinion about her back.  Recheck 4-6 weeks  Knee Injection, L Patient verbally consented to procedure. Risks (including potential rare risk of infection), benefits, and alternatives explained. Sterilely prepped with Chloraprep. Ethyl cholride used for anesthesia. 8 cc Lidocaine 1% mixed with 2 cc of Depo-Medrol 40 mg injected using the anterolateral approach without difficulty. No complications with procedure and tolerated well. Patient had decreased pain post-injection.   Trochanteric Bursitis Injection, L Verbal consent obtained. Risks (including infection, potential atrophy), benefits, and alternatives reviewed. Greater trochanter sterilely prepped with Chloraprep. Ethyl Chloride used for anesthesia. 8 cc of Lidocaine 1% injected with 2 cc of 40 mg Depo-Medrol into trochanteric bursa at area of maximal tenderness at greater  trochanter. Needle taken to bone to troch bursa, flows easily. Bursa massaged. No bleeding and no complications. Decreased pain after injection. Needle: 22 gauge spinal needle   Orders Today:  Orders Placed This Encounter  Procedures  . Ambulatory referral to Physical Medicine Rehab    Referral Priority:  Routine    Referral Type:  Rehabilitation    Referral Reason:  Specialty Services Required    Referred to Provider:  Romero Belling, MD    Requested Specialty:  Physical Medicine and Rehabilitation    Number of Visits Requested:  1    Updated Medication List: (Includes new medications, updates to list, dose adjustments) No orders of the defined types were placed in this encounter.    Medications Discontinued: There are no discontinued medications.   Hannah Beat, MD

## 2012-05-17 ENCOUNTER — Ambulatory Visit (INDEPENDENT_AMBULATORY_CARE_PROVIDER_SITE_OTHER): Payer: Medicare Other | Admitting: Family Medicine

## 2012-05-17 ENCOUNTER — Encounter: Payer: Self-pay | Admitting: Family Medicine

## 2012-05-17 VITALS — BP 130/80 | HR 88 | Temp 98.1°F | Wt 208.0 lb

## 2012-05-17 DIAGNOSIS — K754 Autoimmune hepatitis: Secondary | ICD-10-CM

## 2012-05-17 DIAGNOSIS — M1710 Unilateral primary osteoarthritis, unspecified knee: Secondary | ICD-10-CM

## 2012-05-17 DIAGNOSIS — M5417 Radiculopathy, lumbosacral region: Secondary | ICD-10-CM

## 2012-05-17 DIAGNOSIS — IMO0002 Reserved for concepts with insufficient information to code with codable children: Secondary | ICD-10-CM

## 2012-05-17 DIAGNOSIS — M2550 Pain in unspecified joint: Secondary | ICD-10-CM

## 2012-05-17 LAB — SEDIMENTATION RATE: Sed Rate: 37 mm/hr — ABNORMAL HIGH (ref 0–22)

## 2012-05-17 LAB — C-REACTIVE PROTEIN: CRP: <0.5 mg/dL (ref 0.5–20.0)

## 2012-05-17 MED ORDER — TRAMADOL HCL 50 MG PO TABS: 50.0000 mg | ORAL_TABLET | Freq: Three times a day (TID) | ORAL | Status: DC | PRN

## 2012-05-17 NOTE — Patient Instructions (Signed)
Good to see you. Let's check some blood work today. I will call you with your results.

## 2012-05-17 NOTE — Progress Notes (Signed)
57 yo very pleasant female with a h/o autoimmune hepatitis followed by hepatic clinic on transplant list here to discuss her chronic pain and disability paperwork.  Has chronic pain- knee back, now feels like "all of her joints are swollen," mainly bilateral wrists, elbows knees, hips.  Sister had RA.  Mainly describes joint pain but upon further questioning, having some myalgias as well. Hard to sit for long periods of time- makes pain much worse.  Although her liver function has been stable, she does still have some symptoms of encephalopathy- confusion, difficulty concentrating.  Taking lactulose now but could not take it while working.  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  She is concerned about administering medications to patients in her current state of health.  She would like me to include these findings today in her disability paperwork.    Patient Active Problem List  Diagnosis  . DIABETES MELLITUS, STEROID-INDUCED  . VITAMIN D DEFICIENCY  . AUTOIMMUNE HEPATITIS  . SLE  . ACUTE FRONTAL SINUSITIS  . Hyperpigmentation  . Adjustment disorder  . Thrush  . Lumbosacral radiculopathy at L5  . OA (osteoarthritis) of knee   No past medical history on file. No past surgical history on file. History  Substance Use Topics  . Smoking status: Current Every Day Smoker  . Smokeless tobacco: Not on file  . Alcohol Use: Not on file   No family history on file. No Known Allergies Current Outpatient Prescriptions on File Prior to Visit  Medication Sig Dispense Refill  . azaTHIOprine (IMURAN) 50 MG tablet Take four tablets by mouth daily, 200mg        . clindamycin-benzoyl peroxide (BENZACLIN) gel Apply topically 2 (two) times daily.  25 g  0  . ibuprofen (ADVIL,MOTRIN) 800 MG tablet Take 1 tablet (800 mg total) by mouth daily.  30 tablet  6  . lactulose (CHRONULAC) 10 GM/15ML solution Take 30cc three to four  times daily      . LORazepam (ATIVAN) 0.5 MG tablet Take 1/2 tablet by mouth three times a day as needed       . neomycin (MYCIFRADIN) 500 MG tablet Take 1,000 mg by mouth 2 (two) times daily.       Marland Kitchen spironolactone (ALDACTONE) 50 MG tablet Take 50 mg by mouth daily.         The PMH, PSH, Social History, Family History, Medications, and allergies have been reviewed in [facility], and have been updated if relevant.  Physical exam: BP 130/80  Pulse 88  Temp 98.1 F (36.7 C)  Wt 208 lb (94.348 kg)  General:  Well-developed,well-nourished,in no acute distress; alert,appropriate and cooperative throughout examination Head:  normocephalic and atraumatic.   Eyes:  vision grossly intact, pupils equal, pupils round, and pupils reactive to light.   Ears:  R ear normal and L ear normal.   Nose:  no external deformity.   Mouth:  good dentition.   Neck:  No deformities, masses, or tenderness noted. Lungs:  Normal respiratory effort, chest expands symmetrically. Lungs are clear to auscultation, no crackles or wheezes. Heart:  Normal rate and regular rhythm. S1 and S2 normal without gallop, murmur, click, rub or other extra sounds. Abdomen:  Bowel sounds positive,abdomen soft and non-tender without masses, organomegaly or hernias noted. Neurologic:  alert & oriented X3 and gait normal.   Skin:  Intact without suspicious lesions or rashes MSK:  TTP over all four quadrants,  no deformities in hands or fingers. Psych:  Cognition and judgment appear intact. Alert and cooperative with normal attention span and concentration. No apparent delusions, illusions, hallucinations  Assessment and Plan: 1. Joint pain  I suspect OA with ? Fibromyalgia but given her h/o autoimmune disease, will check labs to rule out other rheumatological issues. The patient indicates understanding of these issues and agrees with the plan.  Rheumatoid Factor, C-reactive protein, Sedimentation Rate, ANA, Vitamin D 25 hydroxy  2.  Autoimmune hepatitis  >25 min spent with face to face with patient, >50% counseling and/or coordinating care Forms filled out and returned to patient stating what we talked about in HPI.

## 2012-05-18 LAB — ANA: Anti Nuclear Antibody(ANA): POSITIVE — AB

## 2012-05-18 LAB — RHEUMATOID FACTOR: Rhuematoid fact SerPl-aCnc: <10 IU/mL (ref <=14)

## 2012-05-18 LAB — ANTI-NUCLEAR AB-TITER (ANA TITER): ANA Titer 1: 1:40 {titer} — ABNORMAL HIGH

## 2012-05-18 LAB — VITAMIN D 25 HYDROXY (VIT D DEFICIENCY, FRACTURES): Vit D, 25-Hydroxy: 26 ng/mL — ABNORMAL LOW (ref 30–89)

## 2012-05-19 ENCOUNTER — Other Ambulatory Visit: Payer: Self-pay | Admitting: *Deleted

## 2012-05-19 ENCOUNTER — Telehealth: Payer: Self-pay | Admitting: Family Medicine

## 2012-05-19 ENCOUNTER — Other Ambulatory Visit: Payer: Self-pay | Admitting: Family Medicine

## 2012-05-19 DIAGNOSIS — R7689 Other specified abnormal immunological findings in serum: Secondary | ICD-10-CM

## 2012-05-19 DIAGNOSIS — K754 Autoimmune hepatitis: Secondary | ICD-10-CM

## 2012-05-19 DIAGNOSIS — M2550 Pain in unspecified joint: Secondary | ICD-10-CM

## 2012-05-19 DIAGNOSIS — R700 Elevated erythrocyte sedimentation rate: Secondary | ICD-10-CM

## 2012-05-19 DIAGNOSIS — R768 Other specified abnormal immunological findings in serum: Secondary | ICD-10-CM

## 2012-05-19 MED ORDER — ERGOCALCIFEROL 1.25 MG (50000 UT) PO CAPS: ORAL_CAPSULE | ORAL | Status: DC

## 2012-05-19 NOTE — Telephone Encounter (Signed)
I have never done those evaluations and would not feel comfortable doing so.  I typically defer to PT or her Sports Med doctor.

## 2012-05-19 NOTE — Telephone Encounter (Signed)
Patient called and said Disability wants a Functional Capacity Evaluation form filled out.  I let her know you don't do the Functional Capacity Evaluation and usually those are done by physical therapy.  She had spoken to her physical therapist Albin Felling at [facility] in [place] and she told patient that she doesn't do the FCE form, but the doctor does the form.  Patient is asking if you can order a Functional Capacity Evaluation.  The phone number for [facility] is [phone].

## 2012-05-21 NOTE — Telephone Encounter (Signed)
Left message for physical therapy dept asking that they call me back.

## 2012-05-25 ENCOUNTER — Telehealth: Payer: Self-pay | Admitting: Family Medicine

## 2012-05-25 NOTE — Telephone Encounter (Signed)
Caller: Wilmoth/Patient; Phone: [phone]; Reason for Call: Regarding disability forms: Was told by [employer] that the PCP should fill out the disability forms with generalized information; will save money by having it done by PCP.  Provided with office fax number.  Will fax forms to Dr. Dayton Martes on 05/26/12 for completion.  Please call patient when forms are completed.

## 2012-05-27 NOTE — Telephone Encounter (Signed)
Form faxed back to [facility], after Dr Dayton Martes completed.

## 2012-07-21 ENCOUNTER — Encounter: Payer: Self-pay | Admitting: Family Medicine

## 2012-07-21 ENCOUNTER — Ambulatory Visit: Payer: Medicare Other | Admitting: Family Medicine

## 2012-07-21 ENCOUNTER — Other Ambulatory Visit (INDEPENDENT_AMBULATORY_CARE_PROVIDER_SITE_OTHER): Payer: Medicare Other

## 2012-07-21 ENCOUNTER — Ambulatory Visit (INDEPENDENT_AMBULATORY_CARE_PROVIDER_SITE_OTHER): Payer: Medicare Other | Admitting: Family Medicine

## 2012-07-21 VITALS — BP 120/72 | HR 75 | Temp 98.0°F | Ht 66.0 in | Wt 207.0 lb

## 2012-07-21 DIAGNOSIS — K754 Autoimmune hepatitis: Secondary | ICD-10-CM

## 2012-07-21 DIAGNOSIS — E559 Vitamin D deficiency, unspecified: Secondary | ICD-10-CM

## 2012-07-21 DIAGNOSIS — IMO0002 Reserved for concepts with insufficient information to code with codable children: Secondary | ICD-10-CM

## 2012-07-21 DIAGNOSIS — M329 Systemic lupus erythematosus, unspecified: Secondary | ICD-10-CM

## 2012-07-21 DIAGNOSIS — M5417 Radiculopathy, lumbosacral region: Secondary | ICD-10-CM

## 2012-07-21 DIAGNOSIS — M25559 Pain in unspecified hip: Secondary | ICD-10-CM

## 2012-07-21 LAB — COMPREHENSIVE METABOLIC PANEL
ALT: 30 U/L (ref 0–35)
AST: 29 U/L (ref 0–37)
Albumin: 4 g/dL (ref 3.5–5.2)
Alkaline Phosphatase: 73 U/L (ref 39–117)
BUN: 11 mg/dL (ref 6–23)
CO2: 26 mEq/L (ref 19–32)
Calcium: 9.6 mg/dL (ref 8.4–10.5)
Chloride: 106 mEq/L (ref 96–112)
Creatinine, Ser: 0.9 mg/dL (ref 0.4–1.2)
GFR: 89.54 mL/min (ref >60.00)
Glucose, Bld: 123 mg/dL — ABNORMAL HIGH (ref 70–99)
Potassium: 3.7 mEq/L (ref 3.5–5.1)
Sodium: 139 mEq/L (ref 135–145)
Total Bilirubin: 0.5 mg/dL (ref 0.3–1.2)
Total Protein: 7.9 g/dL (ref 6.0–8.3)

## 2012-07-21 MED ORDER — NORTRIPTYLINE HCL 10 MG PO CAPS: 10.0000 mg | ORAL_CAPSULE | Freq: Every day | ORAL | Status: DC

## 2012-07-21 MED ORDER — OMEPRAZOLE 20 MG PO CPDR: 20.0000 mg | DELAYED_RELEASE_CAPSULE | Freq: Every day | ORAL | Status: DC

## 2012-07-21 MED ORDER — NORTRIPTYLINE HCL 10 MG PO CAPS: ORAL_CAPSULE | ORAL | Status: DC

## 2012-07-21 NOTE — Progress Notes (Signed)
Nature conservation officer at [facility] [address] [place] [address] Phone: [phone] Fax: [phone]  Date:  07/21/2012   Name:  Shannon [name]   DOB:  Feb 08, 1956   MRN:  [id number] Gender: female Age: 57 y.o.  Primary Physician:  Ruthe Mannan, MD  Evaluating MD: Hannah Beat, MD   Chief Complaint: Pain   History of Present Illness:  Shannon [name] is a 57 y.o. pleasant patient who presents with the following:  Complex patient with autoimmune hepatitis on Imuran, lactulose, also potentially with SLE or other connective tissue disorder, significant lumbar radiculopathy and had an ESI 8-9 days ago.   Pain, SLE?, elevated ESR and ANA: Recently saw Dr. Gavin Potters, notes reviewed. Low titer ANA 1:40 + and mildly elevated ESR at our office. Patient was told she had "lupus AB" at her recent OV, but we don't have those notes, labs, and unsure which of the various lupus labs this refers to.  She has also seen Dr. Kelby Aline from [facility], a Lupus scholar, but I do not have his notes, and she does not remember this consult. [facility] references this.)  Dr. Maurice Small, pain a lot on the right, difficulty with 2-3 minutes  She now has some continued pain laterally, but not focally at the GTB. No groin pain. She has had radiculopathy continued but had some success from [facility].  Patient Active Problem List  Diagnosis  . DIABETES MELLITUS, STEROID-INDUCED  . VITAMIN D DEFICIENCY  . AUTOIMMUNE HEPATITIS  . SLE  . ACUTE FRONTAL SINUSITIS  . Hyperpigmentation  . Adjustment disorder  . Thrush  . Lumbosacral radiculopathy at L5  . OA (osteoarthritis) of knee    No past medical history on file.  No past surgical history on file.  History   Social History  . Marital Status: Legally Separated    Spouse Name: N/A    Number of Children: N/A  . Years of Education: N/A   Occupational History  . Not on file.   Social History Main Topics  . Smoking status: Current  Every Day Smoker  . Smokeless tobacco: Not on file  . Alcohol Use: Not on file  . Drug Use: Not on file  . Sexually Active: Not on file   Other Topics Concern  . Not on file   Social History Narrative  . No narrative on file    No family history on file.  No Known Allergies  Medication list has been reviewed and updated.  Outpatient Prescriptions Prior to Visit  Medication Sig Dispense Refill  . azaTHIOprine (IMURAN) 50 MG tablet Take four tablets by mouth daily, 200mg        . ibuprofen (ADVIL,MOTRIN) 800 MG tablet Take 1 tablet (800 mg total) by mouth daily.  30 tablet  6  . lactulose (CHRONULAC) 10 GM/15ML solution Take 30cc three to four times daily      . neomycin (MYCIFRADIN) 500 MG tablet Take 1,000 mg by mouth 2 (two) times daily.       . nortriptyline (PAMELOR) 10 MG capsule Take 1 capsule at bedtime  30 capsule  3  . spironolactone (ALDACTONE) 50 MG tablet Take 50 mg by mouth daily.        . traMADol (ULTRAM) 50 MG tablet Take 1 tablet (50 mg total) by mouth every 8 (eight) hours as needed for pain.  30 tablet  0  . LORazepam (ATIVAN) 0.5 MG tablet Take 1/2 tablet by mouth three times a day as  needed       . clindamycin-benzoyl peroxide (BENZACLIN) gel Apply topically 2 (two) times daily.  25 g  0  . ergocalciferol (VITAMIN D2) 50000 UNITS capsule Take one by mouth once a week x 6 weeks.  6 capsule  0   No facility-administered medications prior to visit.    Review of Systems:   GEN: No fevers, chills. Nontoxic. Primarily MSK c/o today. MSK: Detailed in the HPI GI: tolerating PO intake without difficulty Neuro: No numbness, parasthesias, or tingling associated. Otherwise the pertinent positives of the ROS are noted above.    Physical Examination: BP 120/72  Pulse 75  Temp(Src) 98 F (36.7 C) (Oral)  Ht 5\' 6"  (1.676 m)  Wt 207 lb (93.895 kg)  BMI 33.43 kg/m2  SpO2 99%  Ideal Body Weight: Weight in (lb) to have BMI = 25: 154.6   GEN: WDWN, NAD,  Non-toxic, Alert & Oriented x 3 HEENT: Atraumatic, Normocephalic.  Ears and Nose: No external deformity. EXTR: No clubbing/cyanosis/edema NEURO: Normal gait.  PSYCH: Normally interactive. Conversant. Not depressed or anxious appearing.  Calm demeanor.   HIP EXAM: SIDE: B ROM: Abduction, Flexion, Internal and External range of motion: preserved Pain with terminal IROM and EROM: some with EROM only GTB: NT TTP posterior to this, near glut medius inferior insertion SLR: NEG Knees: No effusion FABER: NT REVERSE FABER: NT, neg Piriformis: NT at direct palpation Str: flexion: 5/5 abduction: 4+/5 adduction: 5/5 Strength testing non-tender     Assessment and Plan:  SLE  Autoimmune hepatitis  Lumbosacral radiculopathy at L5  Hip pain, unspecified laterality  Globally challenging case and certainly SLE and any autoimmune disease can contribute to chronic joint pains. (Potentially Autoimmune hepatitis?)   Hip pain is not intraarticular. Suspect secondary from spine with glute medius tendinopathy. Not GTB, but may have an accessory bursa at femoral neck that is painful. Nevertheless, I have advised rehab program from [place].  Also suggested started Pamelor, which could help with neuropathic / radicular symptoms. Typically, I titrate this up, but I would proceed cautiously with this patient.  Orders Today:  No orders of the defined types were placed in this encounter.    Updated Medication List: (Includes new medications, updates to list, dose adjustments) Meds ordered this encounter  Medications  . predniSONE (DELTASONE) 2.5 MG tablet    Sig: Take 2.5 mg by mouth daily.  Marland Kitchen omeprazole (PRILOSEC) 20 MG capsule    Sig: Take 1 capsule (20 mg total) by mouth daily.    Dispense:  30 capsule    Refill:  11  . nortriptyline (PAMELOR) 10 MG capsule    Sig: Take 1 capsule (10 mg total) by mouth at bedtime.    Dispense:  30 capsule    Refill:  2    Medications  Discontinued: Medications Discontinued During This Encounter  Medication Reason  . ergocalciferol (VITAMIN D2) 50000 UNITS capsule Error  . clindamycin-benzoyl peroxide (BENZACLIN) gel Error      Signed, Spencer T. Copland, MD 07/21/2012 11:08 AM

## 2012-07-22 ENCOUNTER — Encounter: Payer: Self-pay | Admitting: *Deleted

## 2012-07-22 LAB — VITAMIN D 25 HYDROXY (VIT D DEFICIENCY, FRACTURES): Vit D, 25-Hydroxy: 31 ng/mL (ref 30–89)

## 2012-08-05 ENCOUNTER — Other Ambulatory Visit: Payer: Medicare Other

## 2012-08-16 ENCOUNTER — Telehealth: Payer: Self-pay

## 2012-08-16 MED ORDER — AMOXICILLIN 500 MG PO CAPS: ORAL_CAPSULE | ORAL | Status: DC

## 2012-08-16 NOTE — Telephone Encounter (Signed)
Pt said has taken penicillin prior to every dental appt except dental cleaning appts. Pt said when had tooth pulled was given antibiotic to take prior to appt.; pt does not remember exact antibiotic or mg or how many pills she took prior to appt. Pt said the liver dr has never been asked about antibiotics prior to dental appt and she needs med for 08/17/12 and would not get answer that quickly from liver dr.[facility].

## 2012-08-16 NOTE — Telephone Encounter (Signed)
Spoke with pt.  No h/o heart valve replacement or heart disease.  No prothetitic devices.  Will send in rx for Amoxicillin to take prior but will request records from her previous doctor for further details about prophylaxis.  The patient indicates understanding of these issues and agrees with the plan.

## 2012-08-16 NOTE — Telephone Encounter (Signed)
Please call pt to ask her if she has needed antibiotics for dental procedure in past.  I don't know of any indication that she would need to take them unless her liver doctor has recommended this.

## 2012-08-16 NOTE — Telephone Encounter (Signed)
Shannon [name] called back requesting order info faxed to him for his instructor and their records. Pt will need to sign record release and fax to our office to release info in writing. Shannon [name] voiced understanding.

## 2012-08-16 NOTE — Telephone Encounter (Signed)
Shon Hale 3rd year dental student left v/m; pt is scheduled for root canal on front teeth 08/17/12; does pt need prophylactic antibiotic prior to appt.Please advise.

## 2012-09-22 ENCOUNTER — Encounter: Payer: Self-pay | Admitting: Family Medicine

## 2012-09-22 ENCOUNTER — Ambulatory Visit (INDEPENDENT_AMBULATORY_CARE_PROVIDER_SITE_OTHER): Payer: Medicare Other | Admitting: Family Medicine

## 2012-09-22 VITALS — BP 120/78 | HR 98 | Temp 98.5°F | Ht 66.0 in | Wt 207.0 lb

## 2012-09-22 DIAGNOSIS — IMO0002 Reserved for concepts with insufficient information to code with codable children: Secondary | ICD-10-CM

## 2012-09-22 DIAGNOSIS — K754 Autoimmune hepatitis: Secondary | ICD-10-CM

## 2012-09-22 DIAGNOSIS — M25562 Pain in left knee: Secondary | ICD-10-CM

## 2012-09-22 DIAGNOSIS — M1710 Unilateral primary osteoarthritis, unspecified knee: Secondary | ICD-10-CM

## 2012-09-22 DIAGNOSIS — M25569 Pain in unspecified knee: Secondary | ICD-10-CM

## 2012-09-22 DIAGNOSIS — M5417 Radiculopathy, lumbosacral region: Secondary | ICD-10-CM

## 2012-09-22 MED ORDER — LIDOCAINE 5 % EX PTCH: 1.0000 | MEDICATED_PATCH | CUTANEOUS | Status: AC

## 2012-09-22 NOTE — Progress Notes (Signed)
Nature conservation officer at [facility] [address] [place] [address] Phone: [phone] Fax: [phone]  Date:  09/22/2012   Name:  Shannon [name]   DOB:  1956-02-21   MRN:  [id number] Gender: female Age: 57 y.o.  Primary Physician:  Ruthe Mannan, MD  Evaluating MD: Hannah Beat, MD   Chief Complaint: Knee Pain   History of Present Illness:  Shannon [name] is a 57 y.o. pleasant patient who presents with the following:  Left knee is bothering her a lot. "feels like something cutting her." I remember patient with history of auto-immune hepatitis. She feels like she "hurts everywhere."   elevated ESR and ANA: Recently saw Dr. Gavin Potters, notes reviewed. Low titer ANA 1:40 + and mildly elevated ESR at our office. Patient was told she had "lupus AB" at her recent OV, but we don't have those notes, labs, and unsure which of the various lupus labs this refers to.  She has also seen Dr. Kelby Aline from [facility], a Lupus scholar, but I do not have his notes, and she does not remember this consult. [facility] Orth[facility] references this.)  She feels frustrated. I started her on Pamelor, and her mood has improved, but no improvement with pain.  Patient Active Problem List   Diagnosis Date Noted  . Lumbosacral radiculopathy at L5 03/26/2012  . OA (osteoarthritis) of knee 03/26/2012  . Thrush 10/08/2011  . Hyperpigmentation 11/04/2010  . Adjustment disorder 11/04/2010  . ACUTE FRONTAL SINUSITIS 07/03/2010  . DIABETES MELLITUS, STEROID-INDUCED 05/31/2010  . VITAMIN D DEFICIENCY 05/31/2010  . AUTOIMMUNE HEPATITIS 05/31/2010  . SLE 05/31/2010    No past medical history on file.  No past surgical history on file.  History   Social History  . Marital Status: Legally Separated    Spouse Name: N/A    Number of Children: N/A  . Years of Education: N/A   Occupational History  . Not on file.   Social History Main Topics  . Smoking status: Current Every Day Smoker  .  Smokeless tobacco: Not on file  . Alcohol Use: Not on file  . Drug Use: Not on file  . Sexually Active: Not on file   Other Topics Concern  . Not on file   Social History Narrative  . No narrative on file    No family history on file.  No Known Allergies  Medication list has been reviewed and updated.  Outpatient Prescriptions Prior to Visit  Medication Sig Dispense Refill  . azaTHIOprine (IMURAN) 50 MG tablet Take four tablets by mouth daily, 200mg        . ibuprofen (ADVIL,MOTRIN) 800 MG tablet Take 1 tablet (800 mg total) by mouth daily.  30 tablet  6  . lactulose (CHRONULAC) 10 GM/15ML solution Take 30cc three to four times daily      . nortriptyline (PAMELOR) 10 MG capsule Take 1 capsule at bedtime  30 capsule  3  . omeprazole (PRILOSEC) 20 MG capsule Take 1 capsule (20 mg total) by mouth daily.  30 capsule  11  . predniSONE (DELTASONE) 2.5 MG tablet Take 2.5 mg by mouth daily.      Marland Kitchen spironolactone (ALDACTONE) 50 MG tablet Take 50 mg by mouth daily.        Marland Kitchen amoxicillin (AMOXIL) 500 MG capsule 4 tablets po x 1 30-60 minutes prior to dental procedure  4 capsule  0  . LORazepam (ATIVAN) 0.5 MG tablet Take 1/2 tablet by mouth three times a day  as needed       . traMADol (ULTRAM) 50 MG tablet Take 1 tablet (50 mg total) by mouth every 8 (eight) hours as needed for pain.  30 tablet  0  . neomycin (MYCIFRADIN) 500 MG tablet Take 1,000 mg by mouth 2 (two) times daily.       . nortriptyline (PAMELOR) 10 MG capsule Take 1 capsule (10 mg total) by mouth at bedtime.  30 capsule  2   No facility-administered medications prior to visit.    Review of Systems:   GEN: No fevers, chills. Nontoxic. Primarily MSK c/o today. MSK: Detailed in the HPI GI: tolerating PO intake without difficulty Neuro: detailed above Otherwise the pertinent positives of the ROS are noted above.    Physical Examination: BP 120/78  Pulse 98  Temp(Src) 98.5 F (36.9 C) (Oral)  Ht 5\' 6"  (1.676 m)  Wt 207  lb (93.895 kg)  BMI 33.43 kg/m2  SpO2 98%  Ideal Body Weight: Weight in (lb) to have BMI = 25: 154.6   GEN: WDWN, NAD, Non-toxic, Alert & Oriented x 3 HEENT: Atraumatic, Normocephalic.  Ears and Nose: No external deformity. EXTR: No clubbing/cyanosis/edema NEURO: Normal gait.  PSYCH: Normally interactive. Conversant. Not depressed or anxious appearing.  Calm demeanor.   Knee:  L Gait: Normal heel toe pattern ROM: 0-120 Effusion: minimal Echymosis or edema: none Patellar tendon NT Painful PLICA: neg Patellar grind: negative Medial and lateral patellar facet loading: mild pain medial and lateral joint lines: mild pain Mcmurray's neg Flexion-pinch neg Varus and valgus stress: stable Lachman: neg Ant and Post drawer: neg Hip abduction, IR, ER: WNL Hip flexion str: 5/5 Hip abd: 5/5 Quad: 5/5 VMO atrophy:No Hamstring concentric and eccentric: 5/5   Assessment and Plan:  Knee pain, acute, left  Lumbosacral radiculopathy at L5  OA (osteoarthritis) of knee  Autoimmune hepatitis  I do not have an answer for her manifold joint complaints. I would fully explore possibility of systemic rheumatological disease, which seems like this fits this picture.   Knee Injection, L Patient verbally consented to procedure. Risks (including potential rare risk of infection), benefits, and alternatives explained. Sterilely prepped with Chloraprep. Ethyl cholride used for anesthesia. 8 cc Lidocaine 1% mixed with 2 cc of Depo-Medrol 40 mg injected using the anterolateral approach without difficulty. No complications with procedure and tolerated well. Patient had decreased pain post-injection.   Orders Today:  No orders of the defined types were placed in this encounter.    Updated Medication List: (Includes new medications, updates to list, dose adjustments) Meds ordered this encounter  Medications  . lidocaine (LIDODERM) 5 %    Sig: Place 1 patch onto the skin daily. Remove & Discard  patch within 12 hours or as directed by MD    Dispense:  30 patch    Refill:  5    Medications Discontinued: Medications Discontinued During This Encounter  Medication Reason  . neomycin (MYCIFRADIN) 500 MG tablet Error  . nortriptyline (PAMELOR) 10 MG capsule Error      Signed, Aleni Andrus T. Jorge Amparo, MD 09/22/2012 11:33 AM

## 2012-11-05 ENCOUNTER — Telehealth: Payer: Self-pay | Admitting: Family Medicine

## 2012-11-05 NOTE — Telephone Encounter (Signed)
Lupita Leash from [employer] requesting information on patient to determine if she can return to any occupation in a sedentary capacity.  She can be reached at [phone].

## 2012-11-05 NOTE — Telephone Encounter (Signed)
Spoke with Lupita Leash at [employer].  Pt is actually out of work for memory loss and confusion and Lupita Leash is asking if pt is able to work in any capacity- a sedentary job with minimal lifting, etc.  I asked her to fax the paperwork for you to review and complete and advised  Lupita Leash that patient may need follow up visit to complete forms.  She said ok, but that will delay them from making a decision regarding her disability payments.  Please advise.

## 2012-11-05 NOTE — Telephone Encounter (Signed)
Left message advising Shannon [name].

## 2012-11-05 NOTE — Telephone Encounter (Signed)
Yes I know she has not been working due to memory loss but the issue is I am not sure if she could sit for long hours or lift due to her ortho issues.  I am happy to fill out what I can.

## 2012-11-05 NOTE — Telephone Encounter (Signed)
She has been managed by ortho for this and would need their recommendations.

## 2012-11-09 ENCOUNTER — Telehealth: Payer: Self-pay | Admitting: *Deleted

## 2012-11-09 NOTE — Telephone Encounter (Signed)
For regarding pt's disability claim is on your desk.  I have told them that they need to send one to pt's ortho as well.

## 2012-11-11 NOTE — Telephone Encounter (Signed)
On my desk.  We either need to call pt to get her to answer these questions or have her come in to fill out.

## 2012-11-11 NOTE — Telephone Encounter (Signed)
Appt scheduled for 7/7. 

## 2012-11-11 NOTE — Telephone Encounter (Signed)
Left message asking patient to call back

## 2012-11-15 ENCOUNTER — Encounter: Payer: Self-pay | Admitting: Family Medicine

## 2012-11-15 ENCOUNTER — Ambulatory Visit (INDEPENDENT_AMBULATORY_CARE_PROVIDER_SITE_OTHER): Payer: Medicare Other | Admitting: Family Medicine

## 2012-11-15 VITALS — BP 118/70 | HR 87 | Temp 98.7°F | Wt 207.0 lb

## 2012-11-15 DIAGNOSIS — M329 Systemic lupus erythematosus, unspecified: Secondary | ICD-10-CM

## 2012-11-15 DIAGNOSIS — K754 Autoimmune hepatitis: Secondary | ICD-10-CM

## 2012-11-15 NOTE — Progress Notes (Signed)
57 yo very pleasant female with a h/o autoimmune hepatitis followed by hepatic clinic on transplant list here to discuss her chronic pain and disability paperwork.  Has chronic pain- knee back, now feels like "all of her joints are swollen," mainly bilateral wrists, elbows knees, hips.  Sister had RA.  Mainly describes joint pain but upon further questioning, having some myalgias as well. Hard to sit for long periods of time- makes pain much worse.  ? Of SLE.  Followed by Dr. Gavin Potters. + SLE antibody.  Has also seen Dr. Patsy Lager ( last knee injection in 08/2012),  Dr. Kelby Aline from [place], and [facility] ortho.  Although her liver function has been stable, she does still have some symptoms of encephalopathy- confusion, difficulty concentrating.  Taking lactulose now but could not take it while working.  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  She is concerned about administering medications to patients in her current state of health.  She would like me to include these findings today in her disability paperwork.  Antidepressant has helped with mood but not with pain.  She is very frustrated.  Has not returned to ortho or rheum.    Patient Active Problem List   Diagnosis Date Noted  . Lumbosacral radiculopathy at L5 03/26/2012  . OA (osteoarthritis) of knee 03/26/2012  . Thrush 10/08/2011  . Hyperpigmentation 11/04/2010  . Adjustment disorder 11/04/2010  . ACUTE FRONTAL SINUSITIS 07/03/2010  . DIABETES MELLITUS, STEROID-INDUCED 05/31/2010  . VITAMIN D DEFICIENCY 05/31/2010  . AUTOIMMUNE HEPATITIS 05/31/2010  . SLE 05/31/2010   No past medical history on file. No past surgical history on file. History  Substance Use Topics  . Smoking status: Current Every Day Smoker  . Smokeless tobacco: Not on file  . Alcohol Use: Not on file   No family history on file. No Known Allergies Current Outpatient Prescriptions on File  Prior to Visit  Medication Sig Dispense Refill  . amoxicillin (AMOXIL) 500 MG capsule 4 tablets po x 1 30-60 minutes prior to dental procedure  4 capsule  0  . azaTHIOprine (IMURAN) 50 MG tablet Take four tablets by mouth daily, 200mg        . ibuprofen (ADVIL,MOTRIN) 800 MG tablet Take 1 tablet (800 mg total) by mouth daily.  30 tablet  6  . lactulose (CHRONULAC) 10 GM/15ML solution Take 30cc three to four times daily      . lidocaine (LIDODERM) 5 % Place 1 patch onto the skin daily. Remove & Discard patch within 12 hours or as directed by MD  30 patch  5  . LORazepam (ATIVAN) 0.5 MG tablet Take 1/2 tablet by mouth three times a day as needed       . nortriptyline (PAMELOR) 10 MG capsule Take 1 capsule at bedtime  30 capsule  3  . omeprazole (PRILOSEC) 20 MG capsule Take 1 capsule (20 mg total) by mouth daily.  30 capsule  11  . predniSONE (DELTASONE) 2.5 MG tablet Take 2.5 mg by mouth daily.      Marland Kitchen spironolactone (ALDACTONE) 50 MG tablet Take 50 mg by mouth daily.        . traMADol (ULTRAM) 50 MG tablet Take 1 tablet (50 mg total) by mouth every 8 (eight) hours as needed for pain.  30 tablet  0   No current facility-administered medications on file prior to visit.   The PMH, PSH, Social History, Family History,  Medications, and allergies have been reviewed in [facility], and have been updated if relevant.  Physical exam: BP 118/70  Pulse 87  Temp(Src) 98.7 F (37.1 C)  Wt 207 lb (93.895 kg)  BMI 33.43 kg/m2  SpO2 98%  General:  Well-developed,well-nourished,in no acute distress; alert,appropriate and cooperative throughout examination Head:  normocephalic and atraumatic.   Eyes:  vision grossly intact, pupils equal, pupils round, and pupils reactive to light.   Ears:  R ear normal and L ear normal.   Nose:  no external deformity.   Mouth:  good dentition.   Neck:  No deformities, masses, or tenderness noted. Lungs:  Normal respiratory effort, chest expands symmetrically. Lungs are clear  to auscultation, no crackles or wheezes. Heart:  Normal rate and regular rhythm. S1 and S2 normal without gallop, murmur, click, rub or other extra sounds. Abdomen:  Bowel sounds positive,abdomen soft and non-tender without masses, organomegaly or hernias noted. Neurologic:  alert & oriented X3 and gait normal.   Skin:  Intact without suspicious lesions or rashes Psych:  Cognition and judgment appear intact. Alert and cooperative with normal attention span and concentration. No apparent delusions, illusions, hallucinations  Assessment and Plan:   1. Autoimmune hepatitis Has been stable.  Follows up with Dr. Ian Malkin regularly.  2. Poly arthralgias- Really does present like a rheum issue.  I have strongly encouraged her to get a second opinion from rheumatologist at [facility]. Consider increasing prednisone for some improved quality of life. Paperwork filled out with pt and faxed.

## 2012-11-15 NOTE — Patient Instructions (Addendum)
It was good to see you. Please set up an appointment with [facility] rheumatology.  Hang in there.

## 2012-11-22 ENCOUNTER — Other Ambulatory Visit: Payer: Self-pay | Admitting: *Deleted

## 2012-11-22 MED ORDER — NORTRIPTYLINE HCL 10 MG PO CAPS: ORAL_CAPSULE | ORAL | Status: DC

## 2012-11-22 MED ORDER — OMEPRAZOLE 20 MG PO CPDR: 20.0000 mg | DELAYED_RELEASE_CAPSULE | Freq: Every day | ORAL | Status: DC

## 2012-11-22 MED ORDER — IBUPROFEN 800 MG PO TABS: 800.0000 mg | ORAL_TABLET | Freq: Every day | ORAL | Status: DC

## 2012-11-23 ENCOUNTER — Telehealth: Payer: Self-pay

## 2012-11-23 DIAGNOSIS — M2550 Pain in unspecified joint: Secondary | ICD-10-CM | POA: Insufficient documentation

## 2012-11-23 NOTE — Telephone Encounter (Signed)
Referral placed.  Please send my note and Dr. Cyndie Chime last note.

## 2012-11-23 NOTE — Telephone Encounter (Signed)
Pt left v/m pt has contacted [facility] rheumatology about appt; but needs referral with history sent from PCP before appt can be made to [facility] rheumatology dept fax # [phone] and Rheumatology phone # [phone]. Pt request cb.

## 2012-12-20 ENCOUNTER — Other Ambulatory Visit: Payer: Self-pay | Admitting: Family Medicine

## 2012-12-20 NOTE — Telephone Encounter (Signed)
Filled by Dr Georgina Quint in the past, pt told pharmacy to send request here.

## 2012-12-20 NOTE — Telephone Encounter (Signed)
Has this been filled by [place] in the past, or her liver doctor?

## 2012-12-20 NOTE — Telephone Encounter (Signed)
Advised patient

## 2012-12-20 NOTE — Telephone Encounter (Signed)
I will fill one month w/ 1 refill, but subsequent prednisone refills may need to come from GI specialist in the future per Dr. Elmer Sow preference.  Routed to Dr. Dayton Martes as Lorain Childes.

## 2012-12-23 ENCOUNTER — Ambulatory Visit (INDEPENDENT_AMBULATORY_CARE_PROVIDER_SITE_OTHER): Payer: Medicare Other | Admitting: Family Medicine

## 2012-12-23 ENCOUNTER — Ambulatory Visit (INDEPENDENT_AMBULATORY_CARE_PROVIDER_SITE_OTHER)
Admission: RE | Admit: 2012-12-23 | Discharge: 2012-12-23 | Disposition: A | Discharge status: Home / Self Care | Payer: Medicare Other | Source: Ambulatory Visit | Attending: Family Medicine | Admitting: Family Medicine

## 2012-12-23 ENCOUNTER — Encounter: Payer: Self-pay | Admitting: Family Medicine

## 2012-12-23 VITALS — BP 110/70 | HR 63 | Temp 98.3°F | Wt 208.0 lb

## 2012-12-23 DIAGNOSIS — R051 Acute cough: Secondary | ICD-10-CM | POA: Insufficient documentation

## 2012-12-23 DIAGNOSIS — R042 Hemoptysis: Secondary | ICD-10-CM

## 2012-12-23 DIAGNOSIS — R05 Cough: Secondary | ICD-10-CM

## 2012-12-23 DIAGNOSIS — B370 Candidal stomatitis: Secondary | ICD-10-CM

## 2012-12-23 DIAGNOSIS — E168 Other specified disorders of pancreatic internal secretion: Secondary | ICD-10-CM

## 2012-12-23 LAB — GLUCOSE, RANDOM: Glucose, Bld: 107 mg/dL — ABNORMAL HIGH (ref 70–99)

## 2012-12-23 LAB — HEMOGLOBIN A1C: Hgb A1c MFr Bld: 7.7 % — ABNORMAL HIGH (ref 4.6–6.5)

## 2012-12-23 MED ORDER — NYSTATIN 100000 UNIT/ML MT SUSP: 500000.0000 [IU] | Freq: Four times a day (QID) | OROMUCOSAL | Status: DC

## 2012-12-23 MED ORDER — AZITHROMYCIN 250 MG PO TABS: ORAL_TABLET | ORAL | Status: DC

## 2012-12-23 NOTE — Assessment & Plan Note (Addendum)
Encouraged exercise, weight loss, healthy eating habits.  Elevation may be secondary to infection. Will eval with A1C and fasting CBG.

## 2012-12-23 NOTE — Assessment & Plan Note (Signed)
Hx of this with antibiotics. Pt self treated but we will refill nystatin to use if symptoms return on antibiotics.

## 2012-12-23 NOTE — Progress Notes (Signed)
  Subjective:    Patient ID: Shannon [name], female    DOB: 09/23/1955, 57 y.o.   MRN: [id number]  HPI  57 year old female pt of Dr. Elmer Sow with history of  SLE, autoimmune hepatitis and steroid induced diabetes ( 2 years ago) presents with   She has been having fever, chest congestion, fatigue, sore throat cough ongoing for 11 days  Took old levaquin 750 mg x 5 days (finished this 4 days ago).  Minimal improvement. She felt she was getting thrush in throat.. Took nystatin swish, clotriamazole but not much.  She noted some improvement.  She has been having productive cough with bloody sputum yesterday.  No SOB, no wheeze.  Fluid in B ears. No sinus pressure, occ headache.  No recent steroid but 3 days ago random CBG 188, fasting 144. She has been more sleepy lately. She has had some increase in thirst and urination.   No recent steroids other than prednisolone maintenance that she has been on in last 6 months.     Review of Systems  Constitutional: Negative for fever and fatigue.  HENT: Positive for ear pain, congestion and postnasal drip. Negative for sneezing and sinus pressure.   Eyes: Negative for pain.  Respiratory: Negative for chest tightness and shortness of breath.   Cardiovascular: Positive for chest pain. Negative for palpitations and leg swelling.  Gastrointestinal: Negative for abdominal pain.  Genitourinary: Negative for dysuria.       Objective:   Physical Exam  Constitutional: Vital signs are normal. She appears well-developed and well-nourished. She is cooperative.  Non-toxic appearance. She does not appear ill. No distress.  HENT:  Head: Normocephalic.  Right Ear: Hearing, tympanic membrane, external ear and ear canal normal. Tympanic membrane is not erythematous, not retracted and not bulging. No middle ear effusion.  Left Ear: Hearing, tympanic membrane, external ear and ear canal normal. Tympanic membrane is not erythematous, not retracted and not  bulging.  No middle ear effusion.  Nose: Mucosal edema and rhinorrhea present. Right sinus exhibits no maxillary sinus tenderness and no frontal sinus tenderness. Left sinus exhibits no maxillary sinus tenderness and no frontal sinus tenderness.  Mouth/Throat: Uvula is midline and mucous membranes are normal. Posterior oropharyngeal erythema present. No oropharyngeal exudate or posterior oropharyngeal edema.  No thrush in mouth  Eyes: Conjunctivae, EOM and lids are normal. Pupils are equal, round, and reactive to light. Lids are everted and swept, no foreign bodies found.  Neck: Trachea normal and normal range of motion. Neck supple. Carotid bruit is not present. No mass and no thyromegaly present.  Cardiovascular: Normal rate, regular rhythm, S1 normal, S2 normal, normal heart sounds, intact distal pulses and normal pulses.  Exam reveals no gallop and no friction rub.   No murmur heard. Pulmonary/Chest: Effort normal and breath sounds normal. Not tachypneic. No respiratory distress. She has no decreased breath sounds. She has no wheezes. She has no rhonchi. She has no rales.  Constant cough  Neurological: She is alert.  Skin: Skin is warm, dry and intact. No rash noted.  Psychiatric: Her speech is normal and behavior is normal. Judgment normal. Her mood appears not anxious. Cognition and memory are normal. She does not exhibit a depressed mood.          Assessment & Plan:

## 2012-12-23 NOTE — Assessment & Plan Note (Signed)
Given minimal improvement with levaquin... Likely viral infection or atypical pneumonia.  Will eval with CXR given hemoptysis and immunocompromised status.  Pt reports she always trespond better to azithromycin, so we will have her start this for acute bronchitis but discussed how it may be viralk and just require time for resolution.

## 2012-12-23 NOTE — Addendum Note (Signed)
Addended by: Alvina Chou on: 12/23/2012 10:55 AM   Modules accepted: Orders

## 2012-12-23 NOTE — Patient Instructions (Addendum)
Stop by X-ray on way out.  Stop by lab. Start azithromycin. May use nystatin swish and swallow if needed.  Work on low carb, low sugar, no sweetened beverages diet, regular exercise and weight loss.

## 2012-12-27 ENCOUNTER — Other Ambulatory Visit: Payer: Self-pay | Admitting: Family Medicine

## 2012-12-27 DIAGNOSIS — E168 Other specified disorders of pancreatic internal secretion: Secondary | ICD-10-CM

## 2012-12-28 ENCOUNTER — Other Ambulatory Visit: Payer: Self-pay | Admitting: Family Medicine

## 2012-12-28 MED ORDER — METFORMIN HCL 500 MG PO TABS: 500.0000 mg | ORAL_TABLET | Freq: Every day | ORAL | Status: DC

## 2013-01-05 ENCOUNTER — Telehealth: Payer: Self-pay | Admitting: Family Medicine

## 2013-01-05 NOTE — Telephone Encounter (Signed)
Called the patient about [facility], she says she will go. Also she started the metformin 6 days ago and she has lost 8 lbs and she has cut out cabs in her diet. She wanted you to know these things.

## 2013-01-05 NOTE — Telephone Encounter (Signed)
Noted! Thank you

## 2013-01-25 ENCOUNTER — Ambulatory Visit: Payer: Self-pay | Admitting: Family Medicine

## 2013-02-09 ENCOUNTER — Ambulatory Visit: Payer: Self-pay | Admitting: Family Medicine

## 2013-02-20 ENCOUNTER — Other Ambulatory Visit: Payer: Self-pay | Admitting: Family Medicine

## 2013-02-20 NOTE — Telephone Encounter (Signed)
To pcp

## 2013-02-23 ENCOUNTER — Other Ambulatory Visit: Payer: Self-pay | Admitting: Family Medicine

## 2013-02-23 NOTE — Telephone Encounter (Signed)
To pcp

## 2013-03-03 ENCOUNTER — Other Ambulatory Visit: Payer: Self-pay | Admitting: Family Medicine

## 2013-03-24 ENCOUNTER — Other Ambulatory Visit: Payer: Self-pay | Admitting: Family Medicine

## 2013-03-24 DIAGNOSIS — E119 Type 2 diabetes mellitus without complications: Secondary | ICD-10-CM

## 2013-03-31 ENCOUNTER — Other Ambulatory Visit (INDEPENDENT_AMBULATORY_CARE_PROVIDER_SITE_OTHER): Payer: Medicare Other

## 2013-03-31 DIAGNOSIS — E119 Type 2 diabetes mellitus without complications: Secondary | ICD-10-CM

## 2013-03-31 LAB — BASIC METABOLIC PANEL
BUN: 15 mg/dL (ref 6–23)
CO2: 26 mEq/L (ref 19–32)
Calcium: 9.5 mg/dL (ref 8.4–10.5)
Chloride: 105 mEq/L (ref 96–112)
Creatinine, Ser: 0.9 mg/dL (ref 0.4–1.2)
GFR: 89.31 mL/min (ref >60.00)
Glucose, Bld: 93 mg/dL (ref 70–99)
Potassium: 4 mEq/L (ref 3.5–5.1)
Sodium: 138 mEq/L (ref 135–145)

## 2013-03-31 LAB — HEMOGLOBIN A1C: Hgb A1c MFr Bld: 6.8 % — ABNORMAL HIGH (ref 4.6–6.5)

## 2013-04-01 ENCOUNTER — Encounter: Payer: Self-pay | Admitting: Family Medicine

## 2013-04-12 ENCOUNTER — Telehealth: Payer: Self-pay

## 2013-04-12 NOTE — Telephone Encounter (Signed)
This was for [facility] closure for 2014. Noted on project list.

## 2013-04-12 NOTE — Telephone Encounter (Signed)
Pt returned Kim's call;pt has never had a mammogram and pt does not want to schedule a mammogram.

## 2013-05-10 ENCOUNTER — Other Ambulatory Visit: Payer: Self-pay | Admitting: Family Medicine

## 2013-05-10 NOTE — Telephone Encounter (Signed)
Pt requesting medication refill of abx gel. Last ov 12/23/2012. pls advise

## 2013-05-25 ENCOUNTER — Other Ambulatory Visit: Payer: Self-pay | Admitting: Family Medicine

## 2013-05-25 NOTE — Telephone Encounter (Signed)
Pt requesting medication refill. Last ov 12/2012 with no future appts scheduled. pls advise

## 2013-07-09 ENCOUNTER — Other Ambulatory Visit: Payer: Self-pay | Admitting: Family Medicine

## 2013-07-24 ENCOUNTER — Other Ambulatory Visit: Payer: Self-pay | Admitting: Family Medicine

## 2013-07-25 NOTE — Telephone Encounter (Signed)
Pt requesting medication refill. Last ov 11/2012 with no future appts scheduled. pls advise 

## 2013-08-09 ENCOUNTER — Other Ambulatory Visit: Payer: Self-pay | Admitting: Family Medicine

## 2013-08-10 ENCOUNTER — Other Ambulatory Visit: Payer: Self-pay | Admitting: Family Medicine

## 2013-08-10 NOTE — Telephone Encounter (Signed)
Her liver doctor should be prescribing this.

## 2013-08-10 NOTE — Telephone Encounter (Signed)
Pt requesting medication refill. Last ov 11/2012 with no future appts scheduled. pls advise 

## 2013-08-16 ENCOUNTER — Telehealth: Payer: Self-pay | Admitting: Family Medicine

## 2013-08-16 ENCOUNTER — Ambulatory Visit (INDEPENDENT_AMBULATORY_CARE_PROVIDER_SITE_OTHER): Payer: Medicare Other | Admitting: Family Medicine

## 2013-08-16 ENCOUNTER — Encounter: Payer: Self-pay | Admitting: Family Medicine

## 2013-08-16 VITALS — BP 120/76 | HR 59 | Temp 98.0°F | Ht 65.0 in | Wt 201.5 lb

## 2013-08-16 DIAGNOSIS — E139 Other specified diabetes mellitus without complications: Secondary | ICD-10-CM

## 2013-08-16 DIAGNOSIS — R143 Flatulence: Secondary | ICD-10-CM

## 2013-08-16 DIAGNOSIS — R140 Abdominal distension (gaseous): Secondary | ICD-10-CM

## 2013-08-16 DIAGNOSIS — R141 Gas pain: Secondary | ICD-10-CM

## 2013-08-16 DIAGNOSIS — M797 Fibromyalgia: Secondary | ICD-10-CM

## 2013-08-16 DIAGNOSIS — E559 Vitamin D deficiency, unspecified: Secondary | ICD-10-CM

## 2013-08-16 DIAGNOSIS — E168 Other specified disorders of pancreatic internal secretion: Secondary | ICD-10-CM

## 2013-08-16 DIAGNOSIS — R142 Eructation: Secondary | ICD-10-CM

## 2013-08-16 DIAGNOSIS — IMO0001 Reserved for inherently not codable concepts without codable children: Secondary | ICD-10-CM

## 2013-08-16 DIAGNOSIS — K754 Autoimmune hepatitis: Secondary | ICD-10-CM

## 2013-08-16 DIAGNOSIS — Z136 Encounter for screening for cardiovascular disorders: Secondary | ICD-10-CM

## 2013-08-16 DIAGNOSIS — M2550 Pain in unspecified joint: Secondary | ICD-10-CM

## 2013-08-16 HISTORY — DX: Abdominal distension (gaseous): R14.0

## 2013-08-16 LAB — CBC WITH DIFFERENTIAL/PLATELET
Basophils Absolute: 0 10*3/uL (ref 0.0–0.1)
Basophils Relative: 0.6 % (ref 0.0–3.0)
Eosinophils Absolute: 0.1 10*3/uL (ref 0.0–0.7)
Eosinophils Relative: 2.7 % (ref 0.0–5.0)
HCT: 42.6 % (ref 36.0–46.0)
Hemoglobin: 14.3 g/dL (ref 12.0–15.0)
Lymphocytes Relative: 40.2 % (ref 12.0–46.0)
Lymphs Abs: 2 10*3/uL (ref 0.7–4.0)
MCHC: 33.5 g/dL (ref 30.0–36.0)
MCV: 104.6 fl — ABNORMAL HIGH (ref 78.0–100.0)
Monocytes Absolute: 0.4 10*3/uL (ref 0.1–1.0)
Monocytes Relative: 8.4 % (ref 3.0–12.0)
Neutro Abs: 2.3 10*3/uL (ref 1.4–7.7)
Neutrophils Relative %: 48.1 % (ref 43.0–77.0)
Platelets: 178 10*3/uL (ref 150.0–400.0)
RBC: 4.07 Mil/uL (ref 3.87–5.11)
RDW: 15.6 % — ABNORMAL HIGH (ref 11.5–14.6)
WBC: 4.9 10*3/uL (ref 4.5–10.5)

## 2013-08-16 LAB — COMPREHENSIVE METABOLIC PANEL
ALT: 26 U/L (ref 0–35)
AST: 26 U/L (ref 0–37)
Albumin: 4.1 g/dL (ref 3.5–5.2)
Alkaline Phosphatase: 54 U/L (ref 39–117)
BUN: 13 mg/dL (ref 6–23)
CO2: 27 mEq/L (ref 19–32)
Calcium: 9.8 mg/dL (ref 8.4–10.5)
Chloride: 105 mEq/L (ref 96–112)
Creatinine, Ser: 0.8 mg/dL (ref 0.4–1.2)
GFR: 98.49 mL/min (ref >60.00)
Glucose, Bld: 91 mg/dL (ref 70–99)
Potassium: 3.7 mEq/L (ref 3.5–5.1)
Sodium: 139 mEq/L (ref 135–145)
Total Bilirubin: 0.5 mg/dL (ref 0.3–1.2)
Total Protein: 8.2 g/dL (ref 6.0–8.3)

## 2013-08-16 LAB — LIPID PANEL
Cholesterol: 196 mg/dL (ref 0–200)
HDL: 38.1 mg/dL — ABNORMAL LOW (ref >39.00)
LDL Cholesterol: 146 mg/dL — ABNORMAL HIGH (ref 0–99)
Total CHOL/HDL Ratio: 5
Triglycerides: 62 mg/dL (ref 0.0–149.0)
VLDL: 12.4 mg/dL (ref 0.0–40.0)

## 2013-08-16 LAB — TSH: TSH: 0.5 u[IU]/mL (ref 0.35–5.50)

## 2013-08-16 LAB — HEMOGLOBIN A1C: Hgb A1c MFr Bld: 7.1 % — ABNORMAL HIGH (ref 4.6–6.5)

## 2013-08-16 MED ORDER — METFORMIN HCL 500 MG PO TABS: ORAL_TABLET | ORAL | Status: DC

## 2013-08-16 MED ORDER — TRAMADOL HCL 50 MG PO TABS: 50.0000 mg | ORAL_TABLET | Freq: Three times a day (TID) | ORAL | Status: DC | PRN

## 2013-08-16 NOTE — Assessment & Plan Note (Signed)
?  fibromyalgia. Advised taking tramadol more frequently if possible. The patient indicates understanding of these issues and agrees with the plan.

## 2013-08-16 NOTE — Assessment & Plan Note (Signed)
Due for labs today. Orders Placed This Encounter  Procedures  . CBC with Differential  . Comprehensive metabolic panel  . Lipid panel  . TSH  . Vitamin D, 25-hydroxy  . Hemoglobin A1c

## 2013-08-16 NOTE — Progress Notes (Signed)
Pre visit review using our clinic review tool, if applicable. No additional management support is needed unless otherwise documented below in the visit note. 

## 2013-08-16 NOTE — Patient Instructions (Addendum)
Try Beano or gas x for gas discomfort. We may want to consider a probiotic like Align.  We will call you with your lab results.

## 2013-08-16 NOTE — Assessment & Plan Note (Signed)
a1c today 

## 2013-08-16 NOTE — Progress Notes (Signed)
58 yo very pleasant female with a h/o autoimmune hepatitis followed by hepatic clinic on transplant list here for "med refills."  Has chronic pain- went to [facility] rheumatology.  Last note reviewed from 03/04/13- recommended sleep study to rule out OSA and questioned if this is fibromyalgia.  She had sleep study and does NOT have significant OSA per pt. She has rx for tramadol but does not take regularly because it makes her sleepy.  She tries not to take many medications due in order to protect her liver.  Has had more abdominal bloating, gasiness and constipation recently as well. Denies nausea, vomiting or fevers.  Mainly post prandial but can occur anytime.    Patient Active Problem List   Diagnosis Date Noted  . Cough 12/23/2012  . Hemoptysis 12/23/2012  . Polyarthralgia 11/23/2012  . Lumbosacral radiculopathy at L5 03/26/2012  . OA (osteoarthritis) of knee 03/26/2012  . Thrush 10/08/2011  . Hyperpigmentation 11/04/2010  . Adjustment disorder 11/04/2010  . ACUTE FRONTAL SINUSITIS 07/03/2010  . DIABETES MELLITUS, STEROID-INDUCED 05/31/2010  . VITAMIN D DEFICIENCY 05/31/2010  . AUTOIMMUNE HEPATITIS 05/31/2010  . SLE 05/31/2010   No past medical history on file. No past surgical history on file. History  Substance Use Topics  . Smoking status: Current Every Day Smoker  . Smokeless tobacco: Never Used  . Alcohol Use: No   No family history on file. No Known Allergies Current Outpatient Prescriptions on File Prior to Visit  Medication Sig Dispense Refill  . azaTHIOprine (IMURAN) 50 MG tablet TAKE 4 TABLETS BY MOUTH EVERY DAY  120 tablet  5  . clindamycin-benzoyl peroxide (BENZACLIN) gel APPLY TOPICALLY 2 (TWO) TIMES DAILY.  25 g  0  . ibuprofen (ADVIL,MOTRIN) 800 MG tablet TAKE 1 TABLET (800 MG TOTAL) BY MOUTH DAILY.  30 tablet  0  . lactulose (CHRONULAC) 10 GM/15ML solution Take 30cc three to four times daily      . lidocaine (LIDODERM) 5 % Place 1 patch onto the skin daily.  Remove & Discard patch within 12 hours or as directed by MD  30 patch  5  . neomycin (MYCIFRADIN) 500 MG tablet Take 1,000 mg by mouth 2 (two) times daily.      Marland Kitchen omeprazole (PRILOSEC) 20 MG capsule TAKE 1 CAPSULE (20 MG TOTAL) BY MOUTH DAILY.  90 capsule  1  . prednisoLONE 5 MG TABS tablet Take 2.5 mg by mouth daily.       Marland Kitchen spironolactone (ALDACTONE) 50 MG tablet TAKE 1 TABLET BY MOUTH EVERY DAY  90 tablet  1  . traMADol (ULTRAM) 50 MG tablet Take 1 tablet (50 mg total) by mouth every 8 (eight) hours as needed for pain.  30 tablet  0   No current facility-administered medications on file prior to visit.   The PMH, PSH, Social History, Family History, Medications, and allergies have been reviewed in [facility], and have been updated if relevant.  Physical exam: BP 120/76  Pulse 59  Temp(Src) 98 F (36.7 C) (Oral)  Ht 5\' 5"  (1.651 m)  Wt 201 lb 8 oz (91.4 kg)  BMI 33.53 kg/m2  SpO2 99%  General:  Well-developed,well-nourished,in no acute distress; alert,appropriate and cooperative throughout examination Head:  normocephalic and atraumatic.   Eyes:  vision grossly intact, pupils equal, pupils round, and pupils reactive to light.   Ears:  R ear normal and L ear normal.   Nose:  no external deformity.   Mouth:  good dentition.   Neck:  No deformities,  masses, or tenderness noted. Lungs:  Normal respiratory effort, chest expands symmetrically. Lungs are clear to auscultation, no crackles or wheezes. Heart:  Normal rate and regular rhythm. S1 and S2 normal without gallop, murmur, click, rub or other extra sounds. Abdomen:  Bowel sounds positive,abdomen soft and non-tender without masses, organomegaly or hernias noted. Neurologic:  alert & oriented X3 and gait normal.   Skin:  Intact without suspicious lesions or rashes Psych:  Cognition and judgment appear intact. Alert and cooperative with normal attention span and concentration. No apparent delusions, illusions, hallucinations  Assessment  and Plan:

## 2013-08-16 NOTE — Assessment & Plan Note (Signed)
Advised beano, consider probiotic like align. Exam benign and reassuring. Call or return to clinic prn if these symptoms worsen or fail to improve as anticipated. The patient indicates understanding of these issues and agrees with the plan.

## 2013-08-16 NOTE — Telephone Encounter (Signed)
Relevant patient education assigned to patient using Emmi. ° °

## 2013-08-17 LAB — VITAMIN D 25 HYDROXY (VIT D DEFICIENCY, FRACTURES): Vit D, 25-Hydroxy: 42 ng/mL (ref 30–89)

## 2013-08-22 ENCOUNTER — Other Ambulatory Visit: Payer: Self-pay | Admitting: Family Medicine

## 2013-08-23 ENCOUNTER — Other Ambulatory Visit: Payer: Self-pay | Admitting: Family Medicine

## 2013-09-10 ENCOUNTER — Other Ambulatory Visit: Payer: Self-pay | Admitting: Family Medicine

## 2013-09-12 NOTE — Telephone Encounter (Signed)
Last office visit 08/26/2013.  Ok to refill?

## 2013-09-22 ENCOUNTER — Other Ambulatory Visit: Payer: Self-pay | Admitting: Family Medicine

## 2013-09-22 NOTE — Telephone Encounter (Signed)
Last office visit 08/16/2013.  Ok to refill?

## 2013-11-13 IMAGING — CR DG CHEST 2V
2 series · 2 of 2 positions shown · non-contrast
Comparison: None

CHEST - 2 VIEW
CLINICAL DATA: Hemoptysis, cough, fever, immunocompromised

[view not recorded (1 of 2)]
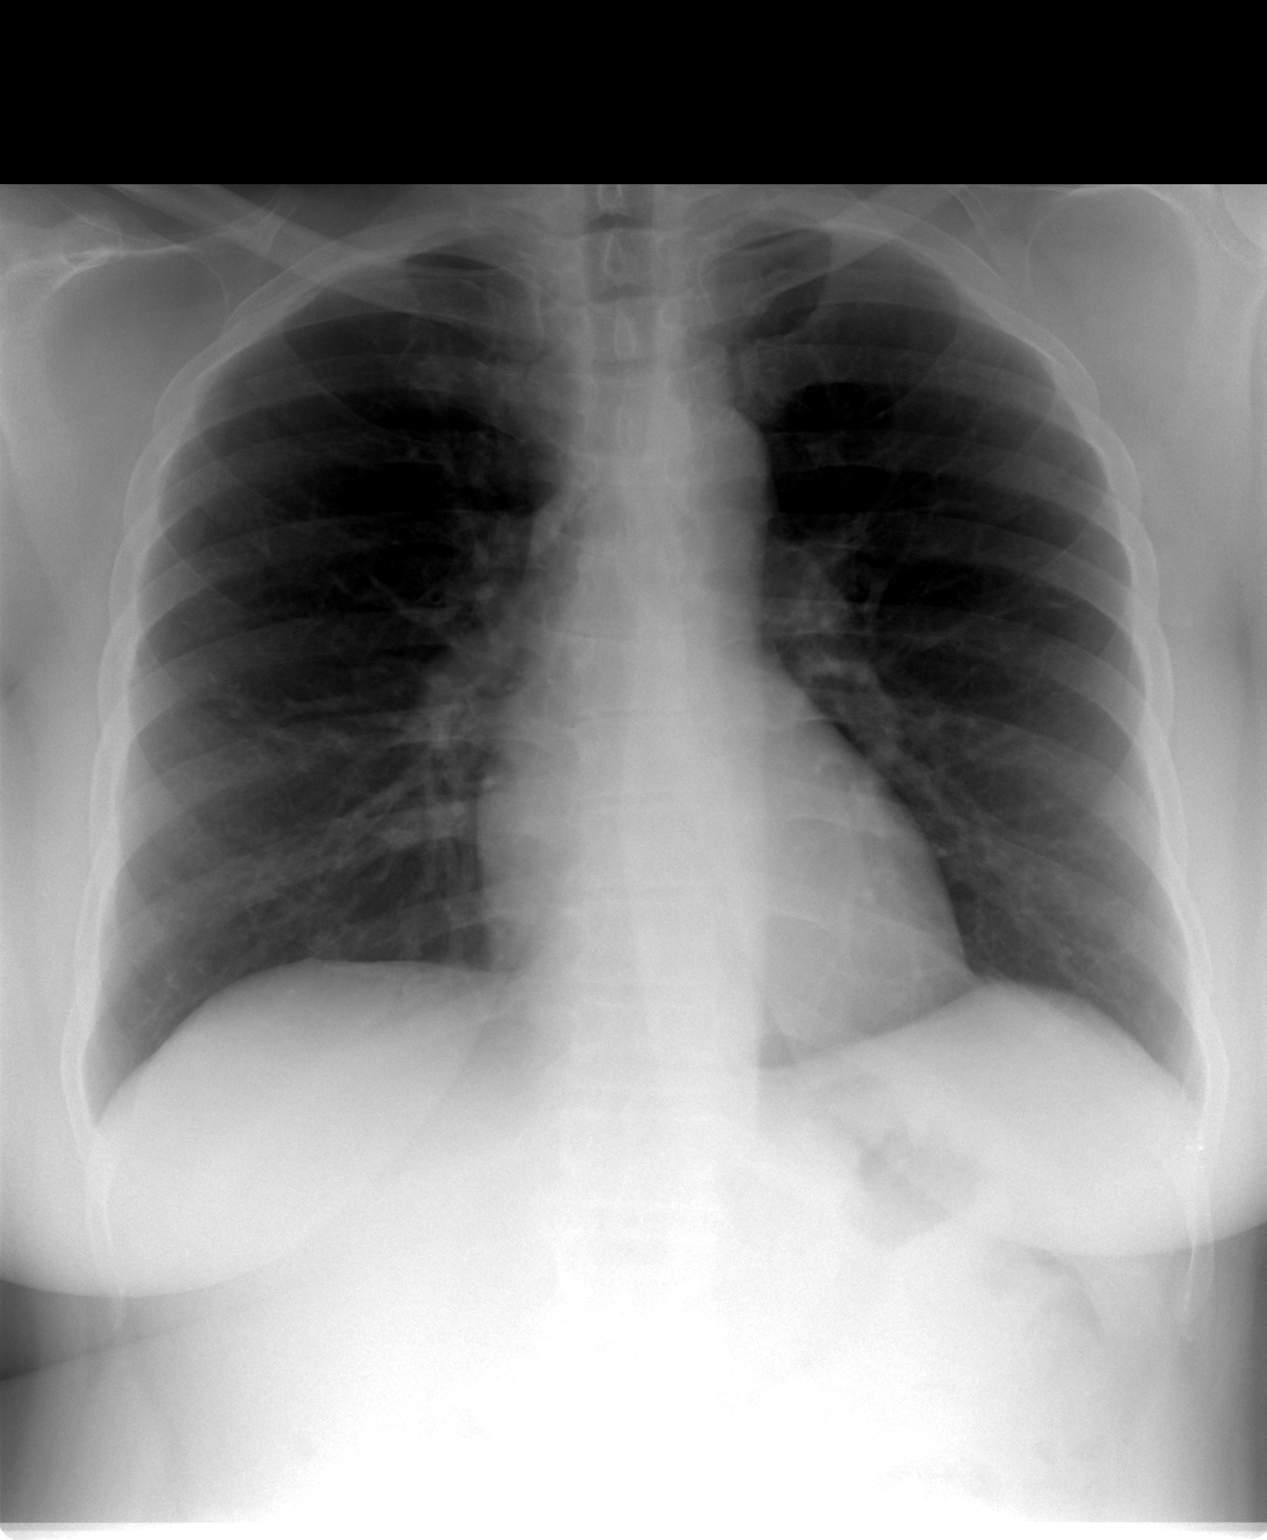

[view not recorded (2 of 2)]
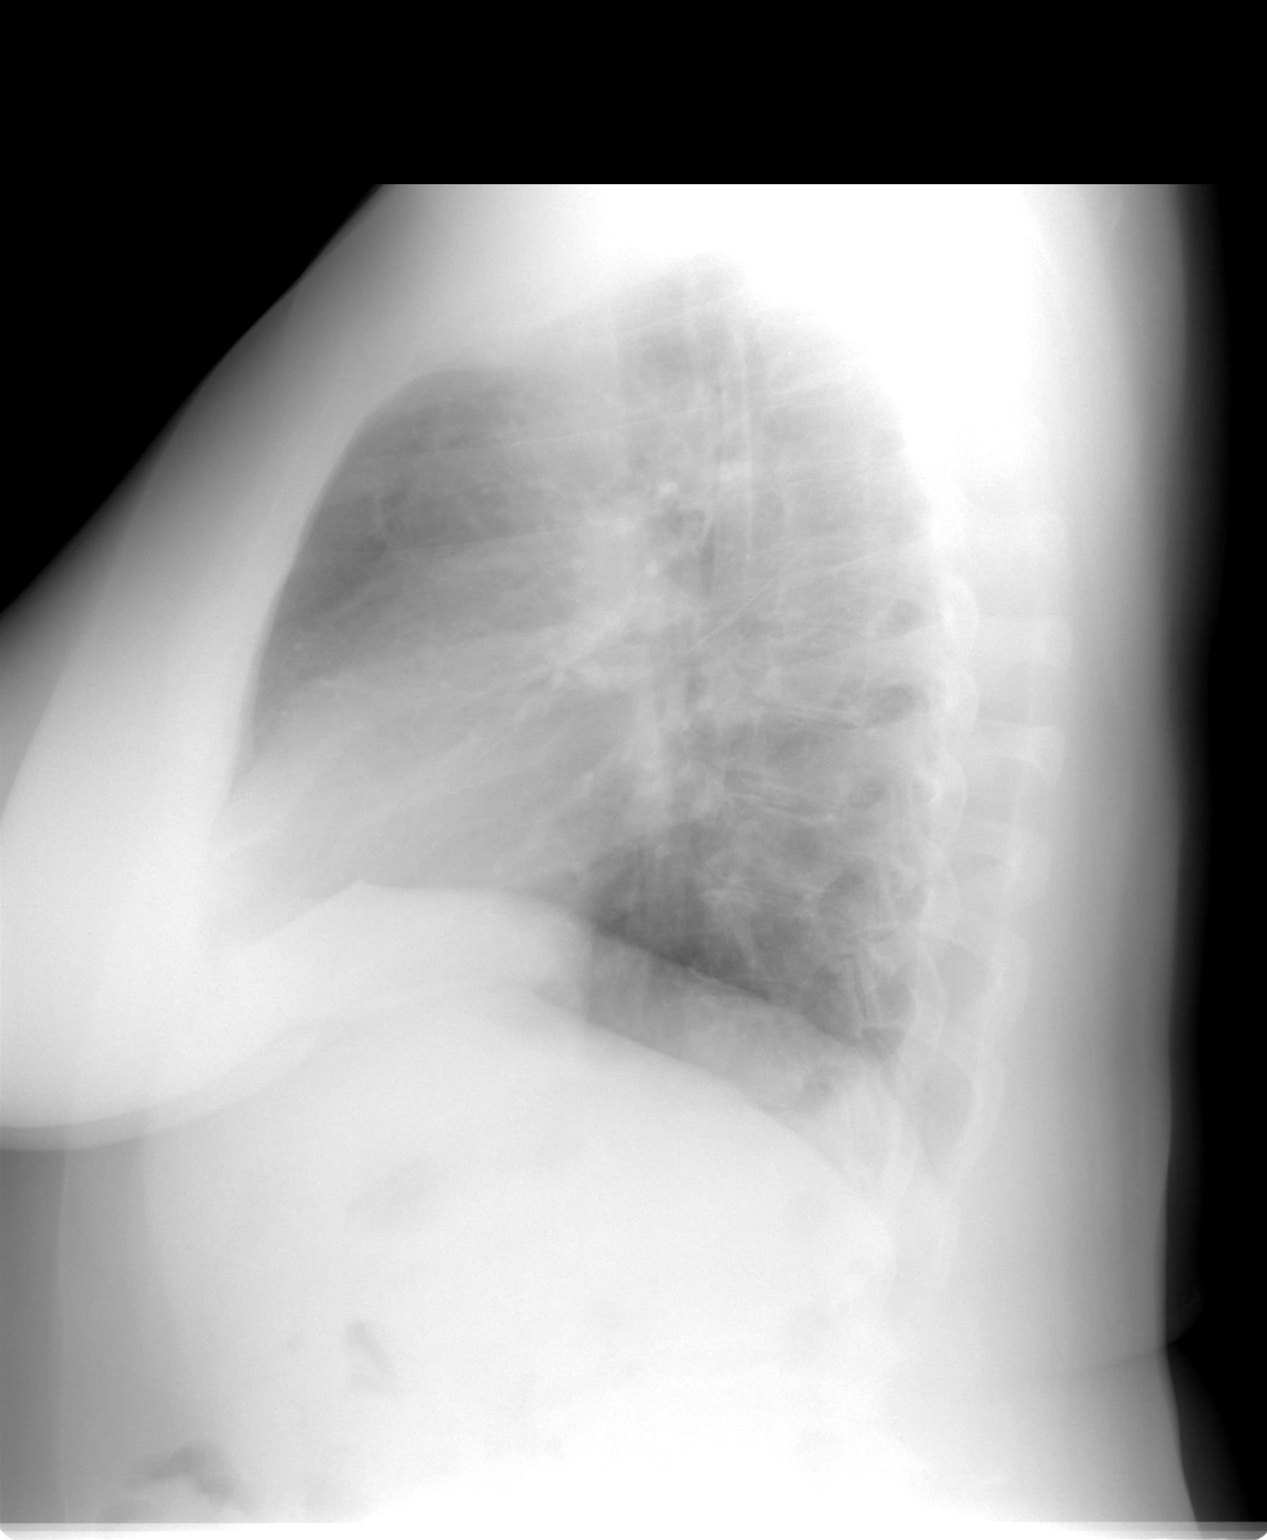

[2 of 2 positions shown; findings below may reference images not displayed]

FINDINGS: The heart, mediastinal, and hilar contours are normal.
The lungs are well-expanded and clear. Negative for pleural
abnormality or pneumothorax. No acute bony abnormality.
IMPRESSION: No acute cardiopulmonary disease.

## 2013-11-18 ENCOUNTER — Other Ambulatory Visit: Payer: Self-pay | Admitting: Family Medicine

## 2013-11-18 NOTE — Telephone Encounter (Signed)
Spoke to pt and informed her Rx has been called in to requested pharmacy 

## 2013-11-18 NOTE — Telephone Encounter (Signed)
Pt requesting medication refill. Last f/u appt 08/2013 with no future appts scheduled. pls advise

## 2013-11-23 ENCOUNTER — Ambulatory Visit (INDEPENDENT_AMBULATORY_CARE_PROVIDER_SITE_OTHER)
Admission: RE | Admit: 2013-11-23 | Discharge: 2013-11-23 | Disposition: A | Discharge status: Home / Self Care | Payer: Medicare Other | Source: Ambulatory Visit | Attending: Family Medicine | Admitting: Family Medicine

## 2013-11-23 ENCOUNTER — Encounter: Payer: Self-pay | Admitting: Family Medicine

## 2013-11-23 ENCOUNTER — Ambulatory Visit (INDEPENDENT_AMBULATORY_CARE_PROVIDER_SITE_OTHER): Payer: Medicare Other | Admitting: Family Medicine

## 2013-11-23 VITALS — BP 138/88 | HR 79 | Temp 98.2°F | Wt 195.5 lb

## 2013-11-23 DIAGNOSIS — R079 Chest pain, unspecified: Secondary | ICD-10-CM

## 2013-11-23 DIAGNOSIS — E168 Other specified disorders of pancreatic internal secretion: Secondary | ICD-10-CM

## 2013-11-23 DIAGNOSIS — F17200 Nicotine dependence, unspecified, uncomplicated: Secondary | ICD-10-CM

## 2013-11-23 DIAGNOSIS — Z720 Tobacco use: Secondary | ICD-10-CM | POA: Insufficient documentation

## 2013-11-23 DIAGNOSIS — E785 Hyperlipidemia, unspecified: Secondary | ICD-10-CM

## 2013-11-23 DIAGNOSIS — K754 Autoimmune hepatitis: Secondary | ICD-10-CM

## 2013-11-23 LAB — COMPREHENSIVE METABOLIC PANEL
ALT: 33 U/L (ref 0–35)
AST: 32 U/L (ref 0–37)
Albumin: 4.1 g/dL (ref 3.5–5.2)
Alkaline Phosphatase: 62 U/L (ref 39–117)
BUN: 12 mg/dL (ref 6–23)
CO2: 28 mEq/L (ref 19–32)
Calcium: 9.7 mg/dL (ref 8.4–10.5)
Chloride: 105 mEq/L (ref 96–112)
Creatinine, Ser: 0.8 mg/dL (ref 0.4–1.2)
GFR: 90.33 mL/min (ref >60.00)
Glucose, Bld: 93 mg/dL (ref 70–99)
Potassium: 3.6 mEq/L (ref 3.5–5.1)
Sodium: 139 mEq/L (ref 135–145)
Total Bilirubin: 0.8 mg/dL (ref 0.2–1.2)
Total Protein: 8.3 g/dL (ref 6.0–8.3)

## 2013-11-23 LAB — HEMOGLOBIN A1C: Hgb A1c MFr Bld: 6.7 % — ABNORMAL HIGH (ref 4.6–6.5)

## 2013-11-23 LAB — MICROALBUMIN / CREATININE URINE RATIO
Creatinine,U: 186.4 mg/dL
Microalb Creat Ratio: 1.2 mg/g (ref 0.0–30.0)
Microalb, Ur: 2.2 mg/dL — ABNORMAL HIGH (ref 0.0–1.9)

## 2013-11-23 NOTE — Progress Notes (Signed)
58 yo very pleasant female with a h/o autoimmune hepatitis followed by hepatic clinic on transplant list here for follow up with complaint of chest pain.  Chest pain- constant left sided chest pain for months. On 11/12/13- very sharp left sided chest pain-was doing house work.  That severe pain has resolved but still having constant dull left sided pain.  Not worsened by exertion or improved by rest.  She is a smoker.  Having more of a morning cough with thick sputum.  No hemoptysis.  Has chronic pain- has been to [facility] rheumatology. She has rx for tramadol but does not take regularly because it makes her sleepy.  She tries not to take many medications due in order to protect her liver.  Lab Results  Component Value Date   ALT 26 08/16/2013   AST 26 08/16/2013   ALKPHOS 54 08/16/2013   BILITOT 0.5 08/16/2013   Lab Results  Component Value Date   CREATININE 0.8 08/16/2013   Steroid induced DM- she is taking Metformin 500 mg daily.  Lab Results  Component Value Date   HGBA1C 7.1* 08/16/2013   Lab Results  Component Value Date   CHOL 196 08/16/2013   HDL 38.10* 08/16/2013   LDLCALC 146* 08/16/2013   TRIG 62.0 08/16/2013   CHOLHDL 5 08/16/2013      Patient Active Problem List   Diagnosis Date Noted  . Chest pain, unspecified 11/23/2013  . Fibromyalgia 08/16/2013  . Abdominal bloating 08/16/2013  . Cough 12/23/2012  . Hemoptysis 12/23/2012  . Polyarthralgia 11/23/2012  . Lumbosacral radiculopathy at L5 03/26/2012  . OA (osteoarthritis) of knee 03/26/2012  . Thrush 10/08/2011  . Hyperpigmentation 11/04/2010  . Adjustment disorder 11/04/2010  . DIABETES MELLITUS, STEROID-INDUCED 05/31/2010  . VITAMIN D DEFICIENCY 05/31/2010  . AUTOIMMUNE HEPATITIS 05/31/2010  . SLE 05/31/2010   No past medical history on file. No past surgical history on file. History  Substance Use Topics  . Smoking status: Current Every Day Smoker  . Smokeless tobacco: Never Used  . Alcohol Use: No   No family  history on file. No Known Allergies Current Outpatient Prescriptions on File Prior to Visit  Medication Sig Dispense Refill  . azaTHIOprine (IMURAN) 50 MG tablet TAKE 4 TABLETS BY MOUTH EVERY DAY  120 tablet  5  . calcium-vitamin D (OSCAL WITH D) 500-200 MG-UNIT per tablet Take 1 tablet by mouth.      . clindamycin-benzoyl peroxide (BENZACLIN) gel APPLY TOPICALLY 2 (TWO) TIMES DAILY.  25 g  0  . gabapentin (NEURONTIN) 300 MG capsule Take 300 mg by mouth 3 (three) times daily.      Marland Kitchen ibuprofen (ADVIL,MOTRIN) 800 MG tablet TAKE 1 TABLET (800 MG TOTAL) BY MOUTH DAILY.  30 tablet  0  . lactulose (CHRONULAC) 10 GM/15ML solution TAKE 30 ML BY MOUTH 4 TIMES DAILY AS NEEDED  42683 mL  4  . lidocaine (LIDODERM) 5 % Place 1 patch onto the skin daily. Remove & Discard patch within 12 hours or as directed by MD  30 patch  5  . metFORMIN (GLUCOPHAGE) 500 MG tablet Take 1 tablet (500 mg total) by mouth daily with breakfast.  90 tablet  0  . neomycin (MYCIFRADIN) 500 MG tablet Take 1,000 mg by mouth 2 (two) times daily.      Marland Kitchen omeprazole (PRILOSEC) 20 MG capsule TAKE 1 CAPSULE (20 MG TOTAL) BY MOUTH DAILY.  90 capsule  1  . predniSONE (DELTASONE) 5 MG tablet TAKE 1/2 TABLET ONCE DAILY  15 tablet  1  . PROAIR HFA 108 (90 BASE) MCG/ACT inhaler INHALE 2 PUFFS AS DIRECTED 60 SECONDS APART  8.5 each  2  . spironolactone (ALDACTONE) 50 MG tablet TAKE 1 TABLET BY MOUTH EVERY DAY  90 tablet  1  . traMADol (ULTRAM) 50 MG tablet TAKE 1 TABLET BY MOUTH EVERY 8 HOURS AS NEEDED  60 tablet  0   No current facility-administered medications on file prior to visit.   The PMH, PSH, Social History, Family History, Medications, and allergies have been reviewed in [facility], and have been updated if relevant.  Physical exam: BP 138/88  Pulse 79  Temp(Src) 98.2 F (36.8 C) (Oral)  Wt 195 lb 8 oz (88.678 kg)  SpO2 98%  General:  Well-developed,well-nourished,in no acute distress; alert,appropriate and cooperative throughout  examination Head:  normocephalic and atraumatic.   Eyes:  vision grossly intact, pupils equal, pupils round, and pupils reactive to light.   Ears:  R ear normal and L ear normal.   Nose:  no external deformity.   Mouth:  good dentition.   Neck:  No deformities, masses, or tenderness noted. Lungs:  Normal respiratory effort +prolonged exp phases, faint wheezes Heart:  Normal rate and regular rhythm. S1 and S2 normal without gallop, murmur, click, rub or other extra sounds. Abdomen:  Bowel sounds positive,abdomen soft and non-tender without masses, organomegaly or hernias noted. Neurologic:  alert & oriented X3 and gait normal.   Skin:  Intact without suspicious lesions or rashes Psych:  Cognition and judgment appear intact. Alert and cooperative with normal attention span and concentration. No apparent delusions, illusions, hallucinations  Assessment and Plan:

## 2013-11-23 NOTE — Patient Instructions (Addendum)
Good to see you. We will call you with your appointment.

## 2013-11-23 NOTE — Assessment & Plan Note (Signed)
Recheck a1c, urine micro today. She is NOT taking ACEI.

## 2013-11-23 NOTE — Assessment & Plan Note (Signed)
Atypical. Likely respiratory- she likely has some component of COPD. She is not ready to quit. Will get CXR. EKG reassuring- NSR with occasional ectopic ventricular beats. Given duration and severity of symptoms, will refer to cards for stress test/further evaluation. The patient indicates understanding of these issues and agrees with the plan.

## 2013-11-23 NOTE — Assessment & Plan Note (Signed)
Liver function has been stable. Will recheck labs today.

## 2013-11-23 NOTE — Progress Notes (Signed)
Pre visit review using our clinic review tool, if applicable. No additional management support is needed unless otherwise documented below in the visit note. 

## 2013-11-23 NOTE — Assessment & Plan Note (Signed)
Not at goal for diabetic. She is unwilling to take statin due to her liver disease. She will continue to work on diet.

## 2013-11-25 ENCOUNTER — Encounter: Payer: Self-pay | Admitting: *Deleted

## 2013-11-26 ENCOUNTER — Other Ambulatory Visit: Payer: Self-pay | Admitting: Family Medicine

## 2013-12-06 ENCOUNTER — Encounter: Payer: Self-pay | Admitting: Cardiovascular Disease

## 2013-12-06 ENCOUNTER — Ambulatory Visit (INDEPENDENT_AMBULATORY_CARE_PROVIDER_SITE_OTHER): Payer: Medicare Other | Admitting: Cardiovascular Disease

## 2013-12-06 VITALS — BP 118/88 | HR 64 | Ht 66.0 in | Wt 199.0 lb

## 2013-12-06 DIAGNOSIS — F17200 Nicotine dependence, unspecified, uncomplicated: Secondary | ICD-10-CM

## 2013-12-06 DIAGNOSIS — R079 Chest pain, unspecified: Secondary | ICD-10-CM

## 2013-12-06 NOTE — Patient Instructions (Signed)
Call or return to clinic prn if these symptoms worsen or fail to improve as anticipated.

## 2013-12-06 NOTE — Assessment & Plan Note (Signed)
I have recommended that she stop smoking

## 2013-12-06 NOTE — Assessment & Plan Note (Signed)
Patient presents with atypical CP.  She had a very brief episode of CP that lasted only 2-3 seconds, perhaps 5 seconds.  Sudden onset, abrupt resolution.  She has chest wall tenderness.    At this point I do not think that she has any symptoms that are consistent with unstable angina. Chest pain is very atypical. I've recommended that she stop smoking. I've recommended she try exercising on a regular basis. She has severe arthritis and perhaps cycling or using a stationary bike would be beneficial.  I will see  Her on an as needed basis.

## 2013-12-06 NOTE — Progress Notes (Signed)
Shannon [name] Date of Birth  04/30/1956       [facility] Office [address], [address], [place], [place]  [address]   [place], [place]  [address] [place]   Fax  [phone]     Fax [phone]  Problem List: 1. Chest pain 2. Autoimmune hepatitis. 3. dibetes mellitis ( primarily steroid induced)  4.   History of Present Illness:  Shannon [name] presents for evaluation of an episode of sudden, sharp chest pain.    She was cleaning her house.  She had a quick shooting CP , mid sternal cp.  Lasted for 3-5 seconds.  Has not recurred.   She is active around the house - she has severe osteoarthritis.  Does not exercise.   Current Outpatient Prescriptions on File Prior to Visit  Medication Sig Dispense Refill  . azaTHIOprine (IMURAN) 50 MG tablet TAKE 4 TABLETS BY MOUTH EVERY DAY  120 tablet  5  . calcium-vitamin D (OSCAL WITH D) 500-200 MG-UNIT per tablet Take 1 tablet by mouth.      . clindamycin-benzoyl peroxide (BENZACLIN) gel APPLY TOPICALLY 2 (TWO) TIMES DAILY.  25 g  0  . gabapentin (NEURONTIN) 300 MG capsule Take 300 mg by mouth 3 (three) times daily as needed.       . lactulose (CHRONULAC) 10 GM/15ML solution TAKE 30 ML BY MOUTH 4 TIMES DAILY AS NEEDED  01027 mL  4  . lidocaine (LIDODERM) 5 % Place 1 patch onto the skin daily. Remove & Discard patch within 12 hours or as directed by MD  30 patch  5  . metFORMIN (GLUCOPHAGE) 500 MG tablet Take 1 tablet (500 mg total) by mouth daily with breakfast.  90 tablet  0  . neomycin (MYCIFRADIN) 500 MG tablet Take 1,000 mg by mouth 2 (two) times daily.      Marland Kitchen omeprazole (PRILOSEC) 20 MG capsule TAKE 1 CAPSULE (20 MG TOTAL) BY MOUTH DAILY.  90 capsule  1  . predniSONE (DELTASONE) 5 MG tablet TAKE 1/2 TABLET ONCE DAILY  15 tablet  2  . PROAIR HFA 108 (90 BASE) MCG/ACT inhaler INHALE 2 PUFFS AS DIRECTED 60 SECONDS APART  8.5 each  2  . spironolactone (ALDACTONE)  50 MG tablet TAKE 1 TABLET BY MOUTH EVERY DAY  90 tablet  1  . traMADol (ULTRAM) 50 MG tablet TAKE 1 TABLET BY MOUTH EVERY 8 HOURS AS NEEDED  60 tablet  0   No current facility-administered medications on file prior to visit.    No Known Allergies  Past Medical History  Diagnosis Date  . Diabetes mellitus without complication   . GERD (gastroesophageal reflux disease)   . Liver disease   . Autoimmune hepatitis   . Osteoarthritis     Past Surgical History  Procedure Laterality Date  . Laparoscopic hysterectomy    . Tubal ligation    . Back surgery      History  Smoking status  . Current Every Day Smoker -- 0.25 packs/day for 20 years  . Types: Cigarettes  Smokeless tobacco  . Never Used    History  Alcohol Use No    Family History  Problem Relation Age of Onset  . Heart disease Father   . Hypertension Father   . Hypertension Sister     Reviw of Systems:  Reviewed in the HPI.  All other systems are negative.  Physical  Exam: Blood pressure 118/88, pulse 64, height 5\' 6"  (1.676 m), weight 199 lb (90.266 kg). Wt Readings from Last 3 Encounters:  12/06/13 199 lb (90.266 kg)  11/23/13 195 lb 8 oz (88.678 kg)  08/16/13 201 lb 8 oz (91.4 kg)     General: Well developed, well nourished, in no acute distress.  Head: Normocephalic, atraumatic, sclera non-icteric, mucus membranes are moist,   Neck: Supple. Carotids are 2 + without bruits. No JVD   Lungs: Clear   Heart: RR, normal S1S2  Abdomen: Soft, non-tender, non-distended with normal bowel sounds.  Msk:  Strength and tone are normal   Extremities: No clubbing or cyanosis. No edema.  Distal pedal pulses are 2+ and equal    Neuro: CN II - XII intact.  Alert and oriented X 3.   Psych:  Normal   ECG: NSR at 80,  Rare PVCs, no ECG changes.   Assessment / Plan:

## 2013-12-11 ENCOUNTER — Other Ambulatory Visit: Payer: Self-pay | Admitting: Family Medicine

## 2014-01-13 ENCOUNTER — Other Ambulatory Visit: Payer: Self-pay | Admitting: *Deleted

## 2014-01-13 MED ORDER — PREDNISONE 5 MG PO TABS: ORAL_TABLET | ORAL | Status: DC

## 2014-01-13 NOTE — Telephone Encounter (Signed)
Pharmacy request quantity increased to a 90 day supply. Is it ok to refill?

## 2014-02-15 ENCOUNTER — Other Ambulatory Visit: Payer: Self-pay | Admitting: *Deleted

## 2014-02-15 MED ORDER — METFORMIN HCL 500 MG PO TABS: 500.0000 mg | ORAL_TABLET | Freq: Every day | ORAL | Status: DC

## 2014-02-21 ENCOUNTER — Ambulatory Visit: Payer: Self-pay | Admitting: Emergency Medicine

## 2014-03-09 ENCOUNTER — Other Ambulatory Visit: Payer: Self-pay | Admitting: *Deleted

## 2014-03-09 MED ORDER — SPIRONOLACTONE 50 MG PO TABS: ORAL_TABLET | ORAL | Status: DC

## 2014-03-10 ENCOUNTER — Encounter: Payer: Self-pay | Admitting: Internal Medicine

## 2014-03-10 ENCOUNTER — Ambulatory Visit (INDEPENDENT_AMBULATORY_CARE_PROVIDER_SITE_OTHER): Payer: Medicare Other | Admitting: Internal Medicine

## 2014-03-10 VITALS — BP 116/78 | HR 72 | Temp 98.1°F | Wt 198.5 lb

## 2014-03-10 DIAGNOSIS — B370 Candidal stomatitis: Secondary | ICD-10-CM

## 2014-03-10 DIAGNOSIS — R05 Cough: Secondary | ICD-10-CM

## 2014-03-10 DIAGNOSIS — R059 Cough, unspecified: Secondary | ICD-10-CM

## 2014-03-10 MED ORDER — HYDROCODONE-HOMATROPINE 5-1.5 MG/5ML PO SYRP: 5.0000 mL | ORAL_SOLUTION | Freq: Three times a day (TID) | ORAL | Status: DC | PRN

## 2014-03-10 MED ORDER — NYSTATIN 100000 UNIT/ML MT SUSP: 5.0000 mL | Freq: Four times a day (QID) | OROMUCOSAL | Status: DC

## 2014-03-10 NOTE — Patient Instructions (Addendum)
Thrush, Adult  Thrush, also called oral candidiasis, is a fungal infection that develops in the mouth and throat and on the tongue. It causes white patches to form on the mouth and tongue. Thrush is most common in older adults, but it can occur at any age.  Many cases of thrush are mild, but this infection can also be more serious. Thrush can be a recurring problem for people who have chronic illnesses or who take medicines that limit the body's ability to fight infection. Because these people have difficulty fighting infections, the fungus that causes thrush can spread throughout the body. This can cause life-threatening blood or organ infections. CAUSES  Thrush is usually caused by a yeast called Candida albicans. This fungus is normally present in small amounts in the mouth and on other mucous membranes. It usually causes no harm. However, when conditions are present that allow the fungus to grow uncontrolled, it invades surrounding tissues and becomes an infection. Less often, other Candida species can also lead to thrush.  RISK FACTORS Thrush is more likely to develop in the following people:  People with an impaired ability to fight infection (weakened immune system).   Older adults.   People with HIV.   People with diabetes.   People with dry mouth (xerostomia).   Pregnant women.   People with poor dental care, especially those who have false teeth.   People who use antibiotic medicines.  SIGNS AND SYMPTOMS  Thrush can be a mild infection that causes no symptoms. If symptoms develop, they may include:   A burning feeling in the mouth and throat. This can occur at the start of a thrush infection.   White patches that adhere to the mouth and tongue. The tissue around the patches may be red, raw, and painful. If rubbed (during tooth brushing, for example), the patches and the tissue of the mouth may bleed easily.   A bad taste in the mouth or difficulty tasting foods.    Cottony feeling in the mouth.   Pain during eating and swallowing. DIAGNOSIS  Your health care provider can usually diagnose thrush by looking in your mouth and asking you questions about your health.  TREATMENT  Medicines that help prevent the growth of fungi (antifungals) are the standard treatment for thrush. These medicines are either applied directly to the affected area (topical) or swallowed (oral). The treatment will depend on the severity of the condition.  Mild Thrush Mild cases of thrush may clear up with the use of an antifungal mouth rinse or lozenges. Treatment usually lasts about 14 days.  Moderate to Severe Thrush  More severe thrush infections that have spread to the esophagus are treated with an oral antifungal medicine. A topical antifungal medicine may also be used.   For some severe infections, a treatment period longer than 14 days may be needed.   Oral antifungal medicines are almost never used during pregnancy because the fetus may be harmed. However, if a pregnant woman has a rare, severe thrush infection that has spread to her blood, oral antifungal medicines may be used. In this case, the risk of harm to the mother and fetus from the severe thrush infection may be greater than the risk posed by the use of antifungal medicines.  Persistent or Recurrent Thrush For cases of thrush that do not go away or keep coming back, treatment may involve the following:   Treatment may be needed twice as long as the symptoms last.   Treatment will   include both oral and topical antifungal medicines.   People with weakened immune systems can take an antifungal medicine on a continuous basis to prevent thrush infections.  It is important to treat conditions that make you more likely to get thrush, such as diabetes or HIV.  HOME CARE INSTRUCTIONS   Only take over-the-counter or prescription medicine as directed by your health care provider. Talk to your health care  provider about an over-the-counter medicine called gentian violet, which kills bacteria and fungi.   Eat plain, unflavored yogurt as directed by your health care provider. Check the label to make sure the yogurt contains live cultures. This yogurt can help healthy bacteria grow in the mouth that can stop the growth of the fungus that causes thrush.   Try these measures to help reduce the discomfort of thrush:   Drink cold liquids such as water or iced tea.   Try flavored ice treats or frozen juices.   Eat foods that are easy to swallow, such as gelatin, ice cream, or custard.   If the patches in your mouth are painful, try drinking from a straw.   Rinse your mouth several times a day with a warm saltwater rinse. You can make the saltwater mixture with 1 tsp (6 g) of salt in 8 fl oz (0.2 L) of warm water.   If you wear dentures, remove the dentures before going to bed, brush them vigorously, and soak them in a cleaning solution as directed by your health care provider.   Women who are breastfeeding should clean their nipples with an antifungal medicine as directed by their health care provider. Dry the nipples after breastfeeding. Applying lanolin-containing body lotion may help relieve nipple soreness.  SEEK MEDICAL CARE IF:  Your symptoms are getting worse or are not improving within 7 days of starting treatment.   You have symptoms of spreading infection, such as white patches on the skin outside of the mouth.   You are nursing and you have redness, burning, or pain in the nipples that is not relieved with treatment.  MAKE SURE YOU:  Understand these instructions.  Will watch your condition.  Will get help right away if you are not doing well or get worse. Document Released: 01/22/2004 Document Revised: 02/16/2013 Document Reviewed: 11/29/2012 Exit[employer] Patient Information 2015 [employer]. This information is not intended to replace advice given to you by your  health care provider. Make sure you discuss any questions you have with your health care provider.  

## 2014-03-10 NOTE — Progress Notes (Signed)
Pre visit review using our clinic review tool, if applicable. No additional management support is needed unless otherwise documented below in the visit note. 

## 2014-03-10 NOTE — Progress Notes (Signed)
Subjective:    Patient ID: Shannon [name], female    DOB: 09-Aug-1955, 58 y.o.   MRN: [id number]  HPI  Pt presents to the clinic today with c/o thrush in her mouth. She reports that she 02/27/14 at [facility] and was placed on antibiotics at that time for the treatment of bronchitis. She developed thrush about 10 days ago. She has had a cough as well productive of white mucous. She denies fever, chills or body aches. The cough seems to be worse when she lays down.  She has had thrush in the past and reports this is the same. She denies sick contacts. She does smoke daily.  Review of Systems      Past Medical History  Diagnosis Date  . Diabetes mellitus without complication   . GERD (gastroesophageal reflux disease)   . Liver disease   . Autoimmune hepatitis   . Osteoarthritis     Current Outpatient Prescriptions  Medication Sig Dispense Refill  . azaTHIOprine (IMURAN) 50 MG tablet TAKE 4 TABLETS BY MOUTH EVERY DAY  120 tablet  5  . calcium-vitamin D (OSCAL WITH D) 500-200 MG-UNIT per tablet Take 1 tablet by mouth.      . clindamycin-benzoyl peroxide (BENZACLIN) gel APPLY TOPICALLY 2 (TWO) TIMES DAILY.  25 g  0  . gabapentin (NEURONTIN) 300 MG capsule Take 300 mg by mouth 3 (three) times daily as needed.       Marland Kitchen ibuprofen (ADVIL,MOTRIN) 800 MG tablet TAKE 1 TABLET (800 MG TOTAL) BY MOUTH DAILY as needed      . lactulose (CHRONULAC) 10 GM/15ML solution TAKE 30 ML BY MOUTH 4 TIMES DAILY AS NEEDED  11572 mL  4  . lidocaine (LIDODERM) 5 % Place 1 patch onto the skin daily. Remove & Discard patch within 12 hours or as directed by MD  30 patch  5  . metFORMIN (GLUCOPHAGE) 500 MG tablet Take 1 tablet (500 mg total) by mouth daily with breakfast.  90 tablet  0  . neomycin (MYCIFRADIN) 500 MG tablet Take 1,000 mg by mouth 2 (two) times daily.      Marland Kitchen omeprazole (PRILOSEC) 20 MG capsule TAKE 1 CAPSULE (20 MG TOTAL) BY MOUTH DAILY.  90 capsule  1  . predniSONE (DELTASONE) 5 MG tablet TAKE 1/2  TABLET ONCE DAILY  30 tablet  0  . PROAIR HFA 108 (90 BASE) MCG/ACT inhaler INHALE 2 PUFFS AS DIRECTED 60 SECONDS APART  8.5 each  2  . spironolactone (ALDACTONE) 50 MG tablet TAKE 1 TABLET BY MOUTH EVERY DAY  90 tablet  1  . traMADol (ULTRAM) 50 MG tablet TAKE 1 TABLET BY MOUTH EVERY 8 HOURS AS NEEDED  60 tablet  0  . vitamin B-12 (CYANOCOBALAMIN) 100 MCG tablet Take 100 mcg by mouth daily.       No current facility-administered medications for this visit.    No Known Allergies  Family History  Problem Relation Age of Onset  . Heart disease Father   . Hypertension Father   . Hypertension Sister     History   Social History  . Marital Status: Legally Separated    Spouse Name: N/A    Number of Children: N/A  . Years of Education: N/A   Occupational History  . Not on file.   Social History Main Topics  . Smoking status: Current Every Day Smoker -- 0.25 packs/day for 20 years    Types: Cigarettes  . Smokeless tobacco: Never Used  . Alcohol  Use: No  . Drug Use: No  . Sexual Activity: Not on file   Other Topics Concern  . Not on file   Social History Narrative  . No narrative on file     Constitutional: Denies fever, malaise, fatigue, headache or abrupt weight changes.  HEENT: Pt reports coating on tongue. Denies eye pain, eye redness, ear pain, ringing in the ears, wax buildup, runny nose, nasal congestion, bloody nose, or sore throat. Respiratory: Pt reports cough. Denies difficulty breathing, shortness of breath,  or sputum production.   Cardiovascular: Denies chest pain, chest tightness, palpitations or swelling in the hands or feet.    No other specific complaints in a complete review of systems (except as listed in HPI above).   Objective:   Physical Exam  BP 116/78  Pulse 72  Temp(Src) 98.1 F (36.7 C) (Oral)  Wt 198 lb 8 oz (90.039 kg)  SpO2 97% Wt Readings from Last 3 Encounters:  03/10/14 198 lb 8 oz (90.039 kg)  12/06/13 199 lb (90.266 kg)    11/23/13 195 lb 8 oz (88.678 kg)    General: Appears her stated age, obese but well developed, well nourished in NAD. Skin: Warm, dry and intact. No rashes, lesions or ulcerations noted. HEENT: Head: normal shape and size; Ears: Tm's gray and intact, normal light reflex; Throat/Mouth: Teeth present, mucosa pink and moist, no exudate, lesions or ulcerations noted. White coating noted on tongue. Cardiovascular: Normal rate and rhythm. S1,S2 noted.  No murmur, rubs or gallops noted.  Pulmonary/Chest: Normal effort and positive vesicular breath sounds. No respiratory distress. No wheezes, rales or ronchi noted.    BMET    Component Value Date/Time   NA 139 11/23/2013 1408   K 3.6 11/23/2013 1408   CL 105 11/23/2013 1408   CO2 28 11/23/2013 1408   GLUCOSE 93 11/23/2013 1408   BUN 12 11/23/2013 1408   CREATININE 0.8 11/23/2013 1408   CREATININE 0.83 03/26/2012 1351   CALCIUM 9.7 11/23/2013 1408    Lipid Panel     Component Value Date/Time   CHOL 196 08/16/2013 1057   TRIG 62.0 08/16/2013 1057   HDL 38.10* 08/16/2013 1057   CHOLHDL 5 08/16/2013 1057   VLDL 12.4 08/16/2013 1057   LDLCALC 146* 08/16/2013 1057    CBC    Component Value Date/Time   WBC 4.9 08/16/2013 1057   RBC 4.07 08/16/2013 1057   HGB 14.3 08/16/2013 1057   HCT 42.6 08/16/2013 1057   PLT 178.0 08/16/2013 1057   MCV 104.6* 08/16/2013 1057   MCHC 33.5 08/16/2013 1057   RDW 15.6* 08/16/2013 1057   LYMPHSABS 2.0 08/16/2013 1057   MONOABS 0.4 08/16/2013 1057   EOSABS 0.1 08/16/2013 1057   BASOSABS 0.0 08/16/2013 1057    Hgb A1C Lab Results  Component Value Date   HGBA1C 6.7* 11/23/2013         Assessment & Plan:   Thrush due to antibiotic use:  eRx for nystatin swish and swallow 4 x day until resolved  Cough, secondary to resolving bronchitis:  Advised her to quit smoking! Lingering likely due to chronic smoker Will give RX for hycodan for cough suppressant  RTC as needed or if symptoms persist or worsen

## 2014-04-24 ENCOUNTER — Encounter: Payer: Self-pay | Admitting: Family Medicine

## 2014-04-24 ENCOUNTER — Ambulatory Visit (INDEPENDENT_AMBULATORY_CARE_PROVIDER_SITE_OTHER): Payer: Medicare Other | Admitting: Family Medicine

## 2014-04-24 VITALS — BP 124/82 | HR 82 | Temp 98.6°F | Wt 193.0 lb

## 2014-04-24 DIAGNOSIS — T380X5A Adverse effect of glucocorticoids and synthetic analogues, initial encounter: Secondary | ICD-10-CM

## 2014-04-24 DIAGNOSIS — Z23 Encounter for immunization: Secondary | ICD-10-CM

## 2014-04-24 DIAGNOSIS — E785 Hyperlipidemia, unspecified: Secondary | ICD-10-CM

## 2014-04-24 DIAGNOSIS — K754 Autoimmune hepatitis: Secondary | ICD-10-CM

## 2014-04-24 DIAGNOSIS — E099 Drug or chemical induced diabetes mellitus without complications: Secondary | ICD-10-CM

## 2014-04-24 LAB — COMPREHENSIVE METABOLIC PANEL
ALT: 34 U/L (ref 0–35)
AST: 27 U/L (ref 0–37)
Albumin: 4.2 g/dL (ref 3.5–5.2)
Alkaline Phosphatase: 73 U/L (ref 39–117)
BUN: 15 mg/dL (ref 6–23)
CO2: 25 mEq/L (ref 19–32)
Calcium: 9.6 mg/dL (ref 8.4–10.5)
Chloride: 105 mEq/L (ref 96–112)
Creatinine, Ser: 0.8 mg/dL (ref 0.4–1.2)
GFR: 99.72 mL/min (ref >60.00)
Glucose, Bld: 99 mg/dL (ref 70–99)
Potassium: 3.7 mEq/L (ref 3.5–5.1)
Sodium: 137 mEq/L (ref 135–145)
Total Bilirubin: 0.8 mg/dL (ref 0.2–1.2)
Total Protein: 8.3 g/dL (ref 6.0–8.3)

## 2014-04-24 LAB — LIPID PANEL
Cholesterol: 202 mg/dL — ABNORMAL HIGH (ref 0–200)
HDL: 42.3 mg/dL (ref >39.00)
LDL Cholesterol: 147 mg/dL — ABNORMAL HIGH (ref 0–99)
NonHDL: 159.7
Total CHOL/HDL Ratio: 5
Triglycerides: 62 mg/dL (ref 0.0–149.0)
VLDL: 12.4 mg/dL (ref 0.0–40.0)

## 2014-04-24 LAB — HEMOGLOBIN A1C: Hgb A1c MFr Bld: 6.7 % — ABNORMAL HIGH (ref 4.6–6.5)

## 2014-04-24 MED ORDER — GLUCOSE BLOOD VI STRP: ORAL_STRIP | Status: DC

## 2014-04-24 NOTE — Assessment & Plan Note (Signed)
Due for labs today.  Declining any further preventative measures other than prevnar 13.

## 2014-04-24 NOTE — Assessment & Plan Note (Signed)
Has not been to hepatologist in awhile- now on every 6 month schedule. Check labs today, route results to [facility]. Will give prevnar 13 today. The patient indicates understanding of these issues and agrees with the plan.

## 2014-04-24 NOTE — Patient Instructions (Signed)
Good to see you. I am so sorry for your family's loss.  I will call you with your lab results.

## 2014-04-24 NOTE — Progress Notes (Signed)
58 yo very pleasant female with a h/o autoimmune hepatitis followed by hepatic clinic here for follow up.   She tries not to take many medications due in order to protect her liver.  Lab Results  Component Value Date   ALT 33 11/23/2013   AST 32 11/23/2013   ALKPHOS 62 11/23/2013   BILITOT 0.8 11/23/2013   Lab Results  Component Value Date   CREATININE 0.8 11/23/2013   Steroid induced DM- she is taking Metformin 500 mg daily.  Needs new glucometer- hers broke and she is unsure how her sugars have been.  Wt Readings from Last 3 Encounters:  04/24/14 193 lb (87.544 kg)  03/10/14 198 lb 8 oz (90.039 kg)  12/06/13 199 lb (90.266 kg)      Lab Results  Component Value Date   HGBA1C 6.7* 11/23/2013   Lab Results  Component Value Date   CHOL 196 08/16/2013   HDL 38.10* 08/16/2013   LDLCALC 146* 08/16/2013   TRIG 62.0 08/16/2013   CHOLHDL 5 08/16/2013      Patient Active Problem List   Diagnosis Date Noted  . Chest pain, unspecified 11/23/2013  . HLD (hyperlipidemia) 11/23/2013  . Smoker 11/23/2013  . Fibromyalgia 08/16/2013  . Abdominal bloating 08/16/2013  . Cough 12/23/2012  . Hemoptysis 12/23/2012  . Polyarthralgia 11/23/2012  . Lumbosacral radiculopathy at L5 03/26/2012  . OA (osteoarthritis) of knee 03/26/2012  . Thrush 10/08/2011  . Hyperpigmentation 11/04/2010  . Adjustment disorder 11/04/2010  . DIABETES MELLITUS, STEROID-INDUCED 05/31/2010  . VITAMIN D DEFICIENCY 05/31/2010  . AUTOIMMUNE HEPATITIS 05/31/2010  . SLE 05/31/2010   Past Medical History  Diagnosis Date  . Diabetes mellitus without complication   . GERD (gastroesophageal reflux disease)   . Liver disease   . Autoimmune hepatitis   . Osteoarthritis    Past Surgical History  Procedure Laterality Date  . Laparoscopic hysterectomy    . Tubal ligation    . Back surgery     History  Substance Use Topics  . Smoking status: Current Every Day Smoker -- 0.25 packs/day for 20 years   Types: Cigarettes  . Smokeless tobacco: Never Used  . Alcohol Use: No   Family History  Problem Relation Age of Onset  . Heart disease Father   . Hypertension Father   . Hypertension Sister    No Known Allergies Current Outpatient Prescriptions on File Prior to Visit  Medication Sig Dispense Refill  . azaTHIOprine (IMURAN) 50 MG tablet TAKE 4 TABLETS BY MOUTH EVERY DAY 120 tablet 5  . calcium-vitamin D (OSCAL WITH D) 500-200 MG-UNIT per tablet Take 1 tablet by mouth.    . clindamycin-benzoyl peroxide (BENZACLIN) gel APPLY TOPICALLY 2 (TWO) TIMES DAILY. 25 g 0  . gabapentin (NEURONTIN) 300 MG capsule Take 300 mg by mouth 3 (three) times daily as needed.     Marland Kitchen HYDROcodone-homatropine (HYCODAN) 5-1.5 MG/5ML syrup Take 5 mLs by mouth every 8 (eight) hours as needed for cough. 120 mL 0  . ibuprofen (ADVIL,MOTRIN) 800 MG tablet TAKE 1 TABLET (800 MG TOTAL) BY MOUTH DAILY as needed    . lactulose (CHRONULAC) 10 GM/15ML solution TAKE 30 ML BY MOUTH 4 TIMES DAILY AS NEEDED 17616 mL 4  . lidocaine (LIDODERM) 5 % Place 1 patch onto the skin daily. Remove & Discard patch within 12 hours or as directed by MD 30 patch 5  . metFORMIN (GLUCOPHAGE) 500 MG tablet Take 1 tablet (500 mg total) by mouth daily with breakfast. 90 tablet  0  . neomycin (MYCIFRADIN) 500 MG tablet Take 1,000 mg by mouth 2 (two) times daily.    Marland Kitchen nystatin (MYCOSTATIN) 100000 UNIT/ML suspension Take 5 mLs (500,000 Units total) by mouth 4 (four) times daily. 180 mL 0  . omeprazole (PRILOSEC) 20 MG capsule TAKE 1 CAPSULE (20 MG TOTAL) BY MOUTH DAILY. 90 capsule 1  . PROAIR HFA 108 (90 BASE) MCG/ACT inhaler INHALE 2 PUFFS AS DIRECTED 60 SECONDS APART 8.5 each 2  . spironolactone (ALDACTONE) 50 MG tablet TAKE 1 TABLET BY MOUTH EVERY DAY 90 tablet 1  . traMADol (ULTRAM) 50 MG tablet TAKE 1 TABLET BY MOUTH EVERY 8 HOURS AS NEEDED 60 tablet 0  . vitamin B-12 (CYANOCOBALAMIN) 100 MCG tablet Take 100 mcg by mouth daily.     No current  facility-administered medications on file prior to visit.   The PMH, PSH, Social History, Family History, Medications, and allergies have been reviewed in [facility], and have been updated if relevant.  Physical exam: BP 124/82 mmHg  Pulse 82  Temp(Src) 98.6 F (37 C) (Oral)  Wt 193 lb (87.544 kg)  SpO2 97%  General:  Well-developed,well-nourished,in no acute distress; alert,appropriate and cooperative throughout examination Head:  normocephalic and atraumatic.   Eyes:  vision grossly intact, pupils equal, pupils round, and pupils reactive to light.   Ears:  R ear normal and L ear normal.   Nose:  no external deformity.   Mouth:  good dentition.   Neck:  No deformities, masses, or tenderness noted. Lungs:  Normal respiratory effort Heart:  Normal rate and regular rhythm. S1 and S2 normal without gallop, murmur, click, rub or other extra sounds. Abdomen:  Bowel sounds positive,abdomen soft and non-tender without masses, organomegaly or hernias noted. Neurologic:  alert & oriented X3 and gait normal.   Skin:  Intact without suspicious lesions or rashes Psych:  Cognition and judgment appear intact. Alert and cooperative with normal attention span and concentration. No apparent delusions, illusions, hallucinations

## 2014-04-24 NOTE — Addendum Note (Signed)
Addended by: Modena Nunnery on: 04/24/2014 09:40 AM   Modules accepted: Orders

## 2014-04-24 NOTE — Progress Notes (Signed)
Pre visit review using our clinic review tool, if applicable. No additional management support is needed unless otherwise documented below in the visit note. 

## 2014-04-25 ENCOUNTER — Encounter: Payer: Self-pay | Admitting: *Deleted

## 2014-04-28 ENCOUNTER — Telehealth: Payer: Self-pay

## 2014-04-28 MED ORDER — ONETOUCH ULTRA SYSTEM W/DEVICE KIT: PACK | Status: AC

## 2014-04-28 NOTE — Telephone Encounter (Signed)
Pt said test strips were sent to [facility] but not meter; spoke with [facility] and pt need one touch ultra meter; advised pt done.

## 2014-05-16 ENCOUNTER — Other Ambulatory Visit: Payer: Self-pay | Admitting: *Deleted

## 2014-05-16 MED ORDER — METFORMIN HCL 500 MG PO TABS: 500.0000 mg | ORAL_TABLET | Freq: Every day | ORAL | Status: DC

## 2014-05-18 ENCOUNTER — Other Ambulatory Visit: Payer: Self-pay | Admitting: *Deleted

## 2014-05-18 MED ORDER — OMEPRAZOLE 20 MG PO CPDR: DELAYED_RELEASE_CAPSULE | ORAL | Status: DC

## 2014-08-14 ENCOUNTER — Other Ambulatory Visit: Payer: Self-pay | Admitting: Family Medicine

## 2014-08-18 ENCOUNTER — Other Ambulatory Visit: Payer: Self-pay | Admitting: Family Medicine

## 2014-08-23 DIAGNOSIS — K754 Autoimmune hepatitis: Secondary | ICD-10-CM | POA: Diagnosis not present

## 2014-08-23 DIAGNOSIS — K7460 Unspecified cirrhosis of liver: Secondary | ICD-10-CM | POA: Diagnosis not present

## 2014-09-03 ENCOUNTER — Other Ambulatory Visit: Payer: Self-pay | Admitting: Family Medicine

## 2014-09-26 ENCOUNTER — Other Ambulatory Visit: Payer: Self-pay | Admitting: Family Medicine

## 2014-10-06 DIAGNOSIS — K8020 Calculus of gallbladder without cholecystitis without obstruction: Secondary | ICD-10-CM | POA: Diagnosis not present

## 2014-10-06 DIAGNOSIS — K754 Autoimmune hepatitis: Secondary | ICD-10-CM | POA: Diagnosis not present

## 2014-10-06 DIAGNOSIS — K7460 Unspecified cirrhosis of liver: Secondary | ICD-10-CM | POA: Diagnosis not present

## 2014-10-17 DIAGNOSIS — K3189 Other diseases of stomach and duodenum: Secondary | ICD-10-CM | POA: Diagnosis not present

## 2014-10-17 DIAGNOSIS — K754 Autoimmune hepatitis: Secondary | ICD-10-CM | POA: Diagnosis not present

## 2014-10-17 DIAGNOSIS — F17200 Nicotine dependence, unspecified, uncomplicated: Secondary | ICD-10-CM | POA: Diagnosis not present

## 2014-10-17 DIAGNOSIS — K8020 Calculus of gallbladder without cholecystitis without obstruction: Secondary | ICD-10-CM | POA: Diagnosis not present

## 2014-10-17 DIAGNOSIS — K7460 Unspecified cirrhosis of liver: Secondary | ICD-10-CM | POA: Diagnosis not present

## 2014-10-17 DIAGNOSIS — M1990 Unspecified osteoarthritis, unspecified site: Secondary | ICD-10-CM | POA: Diagnosis not present

## 2014-10-17 DIAGNOSIS — E119 Type 2 diabetes mellitus without complications: Secondary | ICD-10-CM | POA: Diagnosis not present

## 2014-10-17 DIAGNOSIS — K766 Portal hypertension: Secondary | ICD-10-CM | POA: Diagnosis not present

## 2014-10-17 DIAGNOSIS — J45909 Unspecified asthma, uncomplicated: Secondary | ICD-10-CM | POA: Diagnosis not present

## 2014-10-17 DIAGNOSIS — K219 Gastro-esophageal reflux disease without esophagitis: Secondary | ICD-10-CM | POA: Diagnosis not present

## 2014-10-18 ENCOUNTER — Telehealth: Payer: Self-pay | Admitting: Family Medicine

## 2014-10-18 ENCOUNTER — Other Ambulatory Visit: Payer: Self-pay | Admitting: Family Medicine

## 2014-10-18 NOTE — Telephone Encounter (Signed)
Pt had labs drawn yesterday at [facility], wanted you to know. Pt sated that you can access labs on epic system, thank you

## 2014-11-12 ENCOUNTER — Other Ambulatory Visit: Payer: Self-pay | Admitting: Family Medicine

## 2014-11-14 ENCOUNTER — Other Ambulatory Visit: Payer: Self-pay | Admitting: Family Medicine

## 2014-12-04 ENCOUNTER — Telehealth: Payer: Self-pay

## 2014-12-04 NOTE — Telephone Encounter (Signed)
Left a voicemail for patient to return my call, in regards to scheduling a Mammogram.  

## 2014-12-17 ENCOUNTER — Other Ambulatory Visit: Payer: Self-pay | Admitting: Family Medicine

## 2015-01-23 ENCOUNTER — Encounter: Payer: Self-pay | Admitting: Family Medicine

## 2015-01-23 ENCOUNTER — Ambulatory Visit (INDEPENDENT_AMBULATORY_CARE_PROVIDER_SITE_OTHER): Payer: Medicare Other | Admitting: Family Medicine

## 2015-01-23 VITALS — BP 124/82 | HR 80 | Temp 98.0°F | Wt 193.8 lb

## 2015-01-23 DIAGNOSIS — E785 Hyperlipidemia, unspecified: Secondary | ICD-10-CM

## 2015-01-23 DIAGNOSIS — K3189 Other diseases of stomach and duodenum: Secondary | ICD-10-CM

## 2015-01-23 DIAGNOSIS — R5383 Other fatigue: Secondary | ICD-10-CM

## 2015-01-23 DIAGNOSIS — K754 Autoimmune hepatitis: Secondary | ICD-10-CM | POA: Diagnosis not present

## 2015-01-23 DIAGNOSIS — E099 Drug or chemical induced diabetes mellitus without complications: Secondary | ICD-10-CM

## 2015-01-23 DIAGNOSIS — T380X5A Adverse effect of glucocorticoids and synthetic analogues, initial encounter: Secondary | ICD-10-CM | POA: Diagnosis not present

## 2015-01-23 DIAGNOSIS — F17200 Nicotine dependence, unspecified, uncomplicated: Secondary | ICD-10-CM

## 2015-01-23 DIAGNOSIS — Z720 Tobacco use: Secondary | ICD-10-CM

## 2015-01-23 DIAGNOSIS — K766 Portal hypertension: Secondary | ICD-10-CM

## 2015-01-23 HISTORY — DX: Other fatigue: R53.83

## 2015-01-23 LAB — CBC WITH DIFFERENTIAL/PLATELET
Basophils Absolute: 0 10*3/uL (ref 0.0–0.1)
Basophils Relative: 0.8 % (ref 0.0–3.0)
Eosinophils Absolute: 0.2 10*3/uL (ref 0.0–0.7)
Eosinophils Relative: 3.3 % (ref 0.0–5.0)
HCT: 43.1 % (ref 36.0–46.0)
Hemoglobin: 14.3 g/dL (ref 12.0–15.0)
Lymphocytes Relative: 41.8 % (ref 12.0–46.0)
Lymphs Abs: 2.3 10*3/uL (ref 0.7–4.0)
MCHC: 33.1 g/dL (ref 30.0–36.0)
MCV: 101.9 fl — ABNORMAL HIGH (ref 78.0–100.0)
Monocytes Absolute: 0.5 10*3/uL (ref 0.1–1.0)
Monocytes Relative: 9 % (ref 3.0–12.0)
Neutro Abs: 2.5 10*3/uL (ref 1.4–7.7)
Neutrophils Relative %: 45.1 % (ref 43.0–77.0)
Platelets: 198 10*3/uL (ref 150.0–400.0)
RBC: 4.23 Mil/uL (ref 3.87–5.11)
RDW: 15.9 % — ABNORMAL HIGH (ref 11.5–15.5)
WBC: 5.4 10*3/uL (ref 4.0–10.5)

## 2015-01-23 LAB — COMPREHENSIVE METABOLIC PANEL
ALT: 23 U/L (ref 0–35)
AST: 24 U/L (ref 0–37)
Albumin: 4.2 g/dL (ref 3.5–5.2)
Alkaline Phosphatase: 76 U/L (ref 39–117)
BUN: 12 mg/dL (ref 6–23)
CO2: 30 mEq/L (ref 19–32)
Calcium: 9.9 mg/dL (ref 8.4–10.5)
Chloride: 104 mEq/L (ref 96–112)
Creatinine, Ser: 0.8 mg/dL (ref 0.40–1.20)
GFR: 95.16 mL/min (ref >60.00)
Glucose, Bld: 89 mg/dL (ref 70–99)
Potassium: 3.7 mEq/L (ref 3.5–5.1)
Sodium: 140 mEq/L (ref 135–145)
Total Bilirubin: 0.3 mg/dL (ref 0.2–1.2)
Total Protein: 8.2 g/dL (ref 6.0–8.3)

## 2015-01-23 LAB — MICROALBUMIN / CREATININE URINE RATIO
Creatinine,U: 179.3 mg/dL
Microalb Creat Ratio: 0.9 mg/g (ref 0.0–30.0)
Microalb, Ur: 1.6 mg/dL (ref 0.0–1.9)

## 2015-01-23 LAB — LIPID PANEL
Cholesterol: 206 mg/dL — ABNORMAL HIGH (ref 0–200)
HDL: 38.3 mg/dL — ABNORMAL LOW (ref >39.00)
LDL Cholesterol: 142 mg/dL — ABNORMAL HIGH (ref 0–99)
NonHDL: 167.4
Total CHOL/HDL Ratio: 5
Triglycerides: 126 mg/dL (ref 0.0–149.0)
VLDL: 25.2 mg/dL (ref 0.0–40.0)

## 2015-01-23 LAB — TSH: TSH: 1.2 u[IU]/mL (ref 0.35–4.50)

## 2015-01-23 LAB — LIPASE: Lipase: 31 U/L (ref 11.0–59.0)

## 2015-01-23 LAB — VITAMIN B12: Vitamin B-12: 423 pg/mL (ref 211–911)

## 2015-01-23 LAB — HEMOGLOBIN A1C: Hgb A1c MFr Bld: 6.6 % — ABNORMAL HIGH (ref 4.6–6.5)

## 2015-01-23 NOTE — Assessment & Plan Note (Signed)
New diagnosis 

## 2015-01-23 NOTE — Assessment & Plan Note (Signed)
Due for a1c today. Continue current dose of Metformin.

## 2015-01-23 NOTE — Assessment & Plan Note (Signed)
Deteriorated, likely multifactorial and I do question possibility of recurrent encephalopathy.  Very alert and oriented today, appropriate and pleasant. Check labs as initial work up today. The patient indicates understanding of these issues and agrees with the plan.  Orders Placed This Encounter  Procedures  . Comprehensive metabolic panel  . Lipase  . Hemoglobin A1c  . Microalbumin / creatinine urine ratio  . Vitamin B12  . CBC with Differential/Platelet  . TSH  . Lipid panel

## 2015-01-23 NOTE — Assessment & Plan Note (Signed)
Still not ready to quit. Smoking cessation instruction/counseling given:  counseled patient on the dangers of tobacco use, advised patient to stop smoking, and reviewed strategies to maximize success

## 2015-01-23 NOTE — Progress Notes (Signed)
Pre visit review using our clinic review tool, if applicable. No additional management support is needed unless otherwise documented below in the visit note. 

## 2015-01-23 NOTE — Assessment & Plan Note (Signed)
Due for labs today. 

## 2015-01-23 NOTE — Progress Notes (Signed)
Subjective:   Patient ID: Shannon [name], female    DOB: February 15, 1956, 59 y.o.   MRN: [id number]  Shannon [name] is a pleasant 59 y.o. year old female who presents to clinic today with her daughter, how is an Therapist, sports, for  Follow-up; Mass; and Fatigue  on 01/23/2015  HPI:  She feels like her health is getting worse lately.  Saw her hepatologist, Dr. Patsy Baltimore in 10/2014- notes and studies reviewed. Had endoscopy done on 10/17/14- normal esophagus with poral hypertensive gastropathy.  More fatigued- sleeping sometimes 16 hours at a time and still feels tired.  Not wanting to take lactulose like she is supposed to because of her "dignity."  Daughter does not feels she has been more confused, just more lethargic and sleeping more.  Harriett Sine denies feeling depressed.  Has follow up scheduled with Dr. Patsy Baltimore on 02/26/15.  Stools have not been black or bloody.  Steroid induced DM- she is taking Metformin 500 mg daily  Still smoking. Not ready to quit. Lab Results  Component Value Date   HGBA1C 6.7* 04/24/2014   Lab Results  Component Value Date   ALT 34 04/24/2014   AST 27 04/24/2014   ALKPHOS 73 04/24/2014   BILITOT 0.8 04/24/2014   Lab Results  Component Value Date   WBC 4.9 08/16/2013   HGB 14.3 08/16/2013   HCT 42.6 08/16/2013   MCV 104.6* 08/16/2013   PLT 178.0 08/16/2013   Lab Results  Component Value Date   NA 137 04/24/2014   K 3.7 04/24/2014   CL 105 04/24/2014   CO2 25 04/24/2014   Current Outpatient Prescriptions on File Prior to Visit  Medication Sig Dispense Refill  . azaTHIOprine (IMURAN) 50 MG tablet TAKE 4 TABLETS BY MOUTH EVERY DAY 120 tablet 5  . Blood Glucose Monitoring Suppl (ONE TOUCH ULTRA SYSTEM KIT) W/DEVICE KIT Test blood sugar as directed Dx E09.9 1 each 0  . calcium-vitamin D (OSCAL WITH D) 500-200 MG-UNIT per tablet Take 1 tablet by mouth.    . gabapentin (NEURONTIN) 300 MG capsule Take 300 mg by mouth 3 (three) times daily as needed.     Marland Kitchen  glucose blood test strip Use as instructed 100 each 12  . ibuprofen (ADVIL,MOTRIN) 800 MG tablet TAKE 1 TABLET (800 MG TOTAL) BY MOUTH DAILY. 30 tablet 0  . lactulose (CHRONULAC) 10 GM/15ML solution TAKE 30 ML BY MOUTH 4 TIMES DAILY AS NEEDED 83729 mL 3  . lidocaine (LIDODERM) 5 % Place 1 patch onto the skin daily. Remove & Discard patch within 12 hours or as directed by MD 30 patch 5  . metFORMIN (GLUCOPHAGE) 500 MG tablet TAKE 1 TABLET (500 MG TOTAL) BY MOUTH DAILY WITH BREAKFAST. 90 tablet 1  . neomycin (MYCIFRADIN) 500 MG tablet Take 1,000 mg by mouth 2 (two) times daily.    Marland Kitchen omeprazole (PRILOSEC) 20 MG capsule TAKE 1 CAPSULE (20 MG TOTAL) BY MOUTH DAILY. 90 capsule 1  . predniSONE (DELTASONE) 5 MG tablet TAKE 1/2 TABLET ONCE DAILY 30 tablet 2  . PROAIR HFA 108 (90 BASE) MCG/ACT inhaler INHALE 2 PUFFS AS DIRECTED 60 SECONDS APART 8.5 each 2  . spironolactone (ALDACTONE) 50 MG tablet TAKE 1 TABLET BY MOUTH EVERY DAY 90 tablet 1  . traMADol (ULTRAM) 50 MG tablet TAKE 1 TABLET BY MOUTH EVERY 8 HOURS AS NEEDED 60 tablet 0  . vitamin B-12 (CYANOCOBALAMIN) 100 MCG tablet Take 100 mcg by mouth daily.     No current facility-administered  medications on file prior to visit.    No Known Allergies  Past Medical History  Diagnosis Date  . Diabetes mellitus without complication   . GERD (gastroesophageal reflux disease)   . Liver disease   . Autoimmune hepatitis   . Osteoarthritis     Past Surgical History  Procedure Laterality Date  . Laparoscopic hysterectomy    . Tubal ligation    . Back surgery      Family History  Problem Relation Age of Onset  . Heart disease Father   . Hypertension Father   . Hypertension Sister     Social History   Social History  . Marital Status: Legally Separated    Spouse Name: N/A  . Number of Children: N/A  . Years of Education: N/A   Occupational History  . Not on file.   Social History Main Topics  . Smoking status: Current Every Day  Smoker -- 0.25 packs/day for 20 years    Types: Cigarettes  . Smokeless tobacco: Never Used  . Alcohol Use: No  . Drug Use: No  . Sexual Activity: Not on file   Other Topics Concern  . Not on file   Social History Narrative   The PMH, PSH, Social History, Family History, Medications, and allergies have been reviewed in [facility], and have been updated if relevant.   Review of Systems  Constitutional: Positive for fatigue. Negative for fever and unexpected weight change.  HENT: Negative.   Eyes: Negative.   Cardiovascular: Negative.   Gastrointestinal: Positive for nausea and abdominal pain. Negative for vomiting, diarrhea, constipation, blood in stool, abdominal distention, anal bleeding and rectal pain.  Endocrine: Negative.   Genitourinary: Negative.   Musculoskeletal: Positive for back pain.  Skin: Negative.   Allergic/Immunologic: Negative.   Neurological: Negative.   Hematological: Negative.   Psychiatric/Behavioral: Positive for sleep disturbance. Negative for suicidal ideas, hallucinations, behavioral problems, confusion, self-injury, dysphoric mood and decreased concentration. The patient is not nervous/anxious and is not hyperactive.   All other systems reviewed and are negative.      Objective:    BP 124/82 mmHg  Pulse 80  Temp(Src) 98 F (36.7 C) (Oral)  Wt 193 lb 12 oz (87.884 kg)  SpO2 97%  Wt Readings from Last 3 Encounters:  01/23/15 193 lb 12 oz (87.884 kg)  04/24/14 193 lb (87.544 kg)  03/10/14 198 lb 8 oz (90.039 kg)     Physical Exam  Constitutional: She is oriented to person, place, and time. She appears well-developed and well-nourished. No distress.  HENT:  Head: Normocephalic and atraumatic.  Eyes: Conjunctivae are normal.  Neck: Normal range of motion.  Cardiovascular: Normal rate and regular rhythm.   Pulmonary/Chest: Effort normal.  Abdominal: Soft. She exhibits no distension and no mass. There is tenderness. There is no rebound and no  guarding.  Musculoskeletal: Normal range of motion. She exhibits no edema.  Neurological: She is alert and oriented to person, place, and time.  Skin: Skin is warm and dry.  Psychiatric: She has a normal mood and affect. Her behavior is normal. Judgment and thought content normal.  Nursing note and vitals reviewed.         Assessment & Plan:   HLD (hyperlipidemia)  Autoimmune hepatitis  Steroid-induced diabetes  Portal hypertensive gastropathy No Follow-up on file.

## 2015-01-24 ENCOUNTER — Encounter: Payer: Self-pay | Admitting: *Deleted

## 2015-01-30 ENCOUNTER — Other Ambulatory Visit: Payer: Self-pay | Admitting: Family Medicine

## 2015-02-08 ENCOUNTER — Encounter: Payer: Self-pay | Admitting: Family Medicine

## 2015-02-08 ENCOUNTER — Ambulatory Visit (INDEPENDENT_AMBULATORY_CARE_PROVIDER_SITE_OTHER): Payer: Medicare Other | Admitting: Family Medicine

## 2015-02-08 VITALS — BP 125/70 | HR 77 | Temp 99.0°F | Ht 66.0 in | Wt 193.2 lb

## 2015-02-08 DIAGNOSIS — K754 Autoimmune hepatitis: Secondary | ICD-10-CM | POA: Diagnosis not present

## 2015-02-08 DIAGNOSIS — M1712 Unilateral primary osteoarthritis, left knee: Secondary | ICD-10-CM

## 2015-02-08 MED ORDER — METHYLPREDNISOLONE ACETATE 40 MG/ML IJ SUSP
80.0000 mg | Freq: Once | INTRAMUSCULAR | Status: AC
  Administered 2015-02-08: 80 mg via INTRA_ARTICULAR

## 2015-02-08 NOTE — Addendum Note (Signed)
Addended by: Carter Kitten on: 02/08/2015 11:03 AM   Modules accepted: Orders

## 2015-02-08 NOTE — Progress Notes (Signed)
Dr. Frederico Hamman T. Copland, MD, [facility] [place], [address] Phone: [phone] Fax: [phone]  02/08/2015  Patient: Shannon [name], MRN: [id number], DOB: [birth year], 59 y.o.  Primary Physician:  Arnette Norris, MD  Chief Complaint: Knee Pain  Subjective:   Shannon [name] is a 59 y.o. very pleasant female patient who presents with the following:  L knee pain, known meniscal tear and known tricompartmental OA:  Knee bothering her quite a lot.  Still ongoing back issues, too.  She is on prednisone and Imuran for autoimmune hepatitis. Her chart also indicates that the patient has systemic lupus.  She is also complaining of left knee pain, and she previously has an MRI that I reviewed, and she does have a medial meniscal tear, and additionally she also has tricompartmental arthritis. Her knee has been hurting her and getting worse over the last year, she is limping quite a bit. No locking up. No mechanical symptoms.  Motion ok  Past Medical History, Surgical History, Social History, Family History, Problem List, Medications, and Allergies have been reviewed and updated if relevant.  Patient Active Problem List   Diagnosis Date Noted  . Portal hypertensive gastropathy 01/23/2015  . Fatigue 01/23/2015  . HLD (hyperlipidemia) 11/23/2013  . Smoker 11/23/2013  . Fibromyalgia 08/16/2013  . Abdominal bloating 08/16/2013  . Cough 12/23/2012  . Polyarthralgia 11/23/2012  . Lumbosacral radiculopathy at L5 03/26/2012  . OA (osteoarthritis) of knee 03/26/2012  . Thrush 10/08/2011  . Hyperpigmentation 11/04/2010  . Adjustment disorder 11/04/2010  . Steroid-induced diabetes 05/31/2010  . VITAMIN D DEFICIENCY 05/31/2010  . AUTOIMMUNE HEPATITIS 05/31/2010  . SLE 05/31/2010    Past Medical History  Diagnosis Date  . Diabetes mellitus without complication   . GERD (gastroesophageal reflux disease)   . Liver  disease   . Autoimmune hepatitis   . Osteoarthritis     Past Surgical History  Procedure Laterality Date  . Laparoscopic hysterectomy    . Tubal ligation    . Back surgery      Social History   Social History  . Marital Status: Legally Separated    Spouse Name: N/A  . Number of Children: N/A  . Years of Education: N/A   Occupational History  . Not on file.   Social History Main Topics  . Smoking status: Current Every Day Smoker -- 0.25 packs/day for 20 years    Types: Cigarettes  . Smokeless tobacco: Never Used  . Alcohol Use: No  . Drug Use: No  . Sexual Activity: Not on file   Other Topics Concern  . Not on file   Social History Narrative    Family History  Problem Relation Age of Onset  . Heart disease Father   . Hypertension Father   . Hypertension Sister     No Known Allergies  Medication list reviewed and updated in full in [facility].  GEN: No fevers, chills. Nontoxic. Primarily MSK c/o today. MSK: Detailed in the HPI GI: tolerating PO intake without difficulty Neuro: No numbness, parasthesias, or tingling associated. Otherwise the pertinent positives of the ROS are noted above.   Objective:   BP 125/70 mmHg  Pulse 77  Temp(Src) 99 F (37.2 C) (Oral)  Ht _0  (1.676 m)  Wt 193 lb 4 oz (87.658 kg)  BMI 31.21 kg/m2   GEN: WDWN, NAD, Non-toxic, Alert & Oriented x 3 HEENT: Atraumatic, Normocephalic.  Ears and Nose: No  external deformity. EXTR: No clubbing/cyanosis/edema NEURO: Normal gait.  PSYCH: Normally interactive. Conversant. Not depressed or anxious appearing.  Calm demeanor.   Knee:  R Gait: Normal heel toe pattern ROM: 0-125 Effusion: neg Echymosis or edema: none Patellar tendon NT Painful PLICA: neg Patellar grind: negative Medial and lateral patellar facet loading: mild TTP medial and lateral joint lines: medial > lateral pain Mcmurray's neg Flexion-pinch pain Varus and valgus stress: stable Lachman: neg Ant and  Post drawer: neg Hip abduction, IR, ER: WNL Hip flexion str: 5/5 Hip abd: 5/5 Quad: 5/5 VMO atrophy:No Hamstring concentric and eccentric: 5/5   Radiology: Reviewed  Assessment and Plan:   Primary osteoarthritis of left knee  Autoimmune hepatitis  She is doing a good job medically managing her osteoarthritis, and I can think of any other oral or topical things that we could add for her.  Think it is reasonable to give her a intra-articular injection of cortical sterilely for pain management and for pain control.  Knee Injection, L Patient verbally consented to procedure. Risks (including potential rare risk of infection), benefits, and alternatives explained. Sterilely prepped with Chloraprep. Ethyl cholride used for anesthesia. 8 cc Lidocaine 1% mixed with Depo-Medrol 80 mg injected using the anteromedial approach without difficulty. No complications with procedure and tolerated well. Patient had decreased pain post-injection.   Signed,  Maud Deed. Copland, MD   Patient's Medications  New Prescriptions   No medications on file  Previous Medications   AZATHIOPRINE (IMURAN) 50 MG TABLET    TAKE 4 TABLETS BY MOUTH EVERY DAY   BLOOD GLUCOSE MONITORING SUPPL (ONE TOUCH ULTRA SYSTEM KIT) W/DEVICE KIT    Test blood sugar as directed Dx E09.9   CALCIUM-VITAMIN D (OSCAL WITH D) 500-200 MG-UNIT PER TABLET    Take 1 tablet by mouth.   GABAPENTIN (NEURONTIN) 300 MG CAPSULE    Take 300 mg by mouth 3 (three) times daily as needed.    GLUCOSE BLOOD TEST STRIP    Use as instructed   IBUPROFEN (ADVIL,MOTRIN) 800 MG TABLET    TAKE 1 TABLET (800 MG TOTAL) BY MOUTH DAILY.   LACTULOSE (CHRONULAC) 10 GM/15ML SOLUTION    TAKE 30 ML BY MOUTH 4 TIMES DAILY AS NEEDED   LIDOCAINE (LIDODERM) 5 %    Place 1 [name] onto the skin daily. Remove & Discard [name] within 12 hours or as directed by MD   METFORMIN (GLUCOPHAGE) 500 MG TABLET    TAKE 1 TABLET (500 MG TOTAL) BY MOUTH DAILY WITH BREAKFAST.    NEOMYCIN (MYCIFRADIN) 500 MG TABLET    Take 1,000 mg by mouth 2 (two) times daily.   OMEPRAZOLE (PRILOSEC) 20 MG CAPSULE    TAKE 1 CAPSULE (20 MG TOTAL) BY MOUTH DAILY.   PREDNISONE (DELTASONE) 5 MG TABLET    TAKE 1/2 TABLET ONCE DAILY   PROAIR HFA 108 (90 BASE) MCG/ACT INHALER    INHALE 2 PUFFS AS DIRECTED 60 SECONDS APART   SPIRONOLACTONE (ALDACTONE) 50 MG TABLET    TAKE 1 TABLET BY MOUTH EVERY DAY   TRAMADOL (ULTRAM) 50 MG TABLET    TAKE 1 TABLET BY MOUTH EVERY 8 HOURS AS NEEDED   VITAMIN B-12 (CYANOCOBALAMIN) 100 MCG TABLET    Take 100 mcg by mouth daily.  Modified Medications   No medications on file  Discontinued Medications   No medications on file

## 2015-02-08 NOTE — Progress Notes (Signed)
Pre visit review using our clinic review tool, if applicable. No additional management support is needed unless otherwise documented below in the visit note. 

## 2015-02-14 ENCOUNTER — Other Ambulatory Visit: Payer: Self-pay | Admitting: Family Medicine

## 2015-02-20 ENCOUNTER — Other Ambulatory Visit: Payer: Self-pay | Admitting: Family Medicine

## 2015-02-26 DIAGNOSIS — K8020 Calculus of gallbladder without cholecystitis without obstruction: Secondary | ICD-10-CM | POA: Diagnosis not present

## 2015-02-26 DIAGNOSIS — K754 Autoimmune hepatitis: Secondary | ICD-10-CM | POA: Diagnosis not present

## 2015-02-26 DIAGNOSIS — K7469 Other cirrhosis of liver: Secondary | ICD-10-CM | POA: Diagnosis not present

## 2015-02-26 DIAGNOSIS — K7460 Unspecified cirrhosis of liver: Secondary | ICD-10-CM | POA: Diagnosis not present

## 2015-03-06 ENCOUNTER — Other Ambulatory Visit: Payer: Self-pay | Admitting: Family Medicine

## 2015-03-13 DIAGNOSIS — M533 Sacrococcygeal disorders, not elsewhere classified: Secondary | ICD-10-CM | POA: Diagnosis not present

## 2015-03-13 DIAGNOSIS — M545 Low back pain: Secondary | ICD-10-CM | POA: Diagnosis not present

## 2015-03-13 DIAGNOSIS — M5137 Other intervertebral disc degeneration, lumbosacral region: Secondary | ICD-10-CM | POA: Diagnosis not present

## 2015-03-13 DIAGNOSIS — G8929 Other chronic pain: Secondary | ICD-10-CM | POA: Diagnosis not present

## 2015-03-13 DIAGNOSIS — M47817 Spondylosis without myelopathy or radiculopathy, lumbosacral region: Secondary | ICD-10-CM | POA: Diagnosis not present

## 2015-03-28 DIAGNOSIS — K754 Autoimmune hepatitis: Secondary | ICD-10-CM | POA: Diagnosis not present

## 2015-03-28 DIAGNOSIS — Z7952 Long term (current) use of systemic steroids: Secondary | ICD-10-CM | POA: Diagnosis not present

## 2015-03-28 DIAGNOSIS — M25552 Pain in left hip: Secondary | ICD-10-CM | POA: Diagnosis not present

## 2015-03-28 DIAGNOSIS — K219 Gastro-esophageal reflux disease without esophagitis: Secondary | ICD-10-CM | POA: Diagnosis not present

## 2015-03-28 DIAGNOSIS — E119 Type 2 diabetes mellitus without complications: Secondary | ICD-10-CM | POA: Diagnosis not present

## 2015-03-28 DIAGNOSIS — J45909 Unspecified asthma, uncomplicated: Secondary | ICD-10-CM | POA: Diagnosis not present

## 2015-03-28 DIAGNOSIS — M1990 Unspecified osteoarthritis, unspecified site: Secondary | ICD-10-CM | POA: Diagnosis not present

## 2015-03-29 DIAGNOSIS — M533 Sacrococcygeal disorders, not elsewhere classified: Secondary | ICD-10-CM | POA: Diagnosis not present

## 2015-04-24 DIAGNOSIS — K7469 Other cirrhosis of liver: Secondary | ICD-10-CM | POA: Diagnosis not present

## 2015-04-24 DIAGNOSIS — K754 Autoimmune hepatitis: Secondary | ICD-10-CM | POA: Diagnosis not present

## 2015-05-04 ENCOUNTER — Other Ambulatory Visit: Payer: Self-pay | Admitting: Family Medicine

## 2015-05-10 ENCOUNTER — Encounter: Payer: Self-pay | Admitting: Emergency Medicine

## 2015-05-10 ENCOUNTER — Ambulatory Visit
Admission: EM | Admit: 2015-05-10 | Discharge: 2015-05-10 | Disposition: A | Discharge status: Home / Self Care | Payer: Medicare Other | Attending: Family Medicine | Admitting: Family Medicine

## 2015-05-10 DIAGNOSIS — J209 Acute bronchitis, unspecified: Secondary | ICD-10-CM

## 2015-05-10 MED ORDER — AZITHROMYCIN 250 MG PO TABS: 250.0000 mg | ORAL_TABLET | Freq: Every day | ORAL | Status: DC

## 2015-05-10 MED ORDER — NYSTATIN 100000 UNIT/ML MT SUSP: 500000.0000 [IU] | Freq: Four times a day (QID) | OROMUCOSAL | Status: DC

## 2015-05-10 NOTE — ED Provider Notes (Signed)
CSN: [id number]     Arrival date & time 05/10/15  1850 History   First MD Initiated Contact with Patient 05/10/15 2005     Chief Complaint  Patient presents with  . Cough   (Consider location/radiation/quality/duration/timing/severity/associated sxs/prior Treatment) HPI  59 year old female with accommodative past medical history including DM 2, auto immune hepatitis presents for an acute visit with complaints of cough and congestion.  Patient states that she's been sick for nearly 2 weeks. She been experiencing significant nasal congestion and marked cough. Cough is mildly productive. No exacerbating or relieving factors. She reports associated fatigue and not feeling well.    Past Medical History  Diagnosis Date  . Diabetes mellitus without complication ([facility])   . GERD (gastroesophageal reflux disease)   . Liver disease   . Autoimmune hepatitis ([facility])   . Osteoarthritis    Past Surgical History  Procedure Laterality Date  . Laparoscopic hysterectomy    . Tubal ligation    . Back surgery    . Abdominal hysterectomy     Family History  Problem Relation Age of Onset  . Heart disease Father   . Hypertension Father   . Hypertension Sister    Social History  Substance Use Topics  . Smoking status: Current Every Day Smoker -- 0.25 packs/day for 20 years    Types: Cigarettes  . Smokeless tobacco: Never Used  . Alcohol Use: No   OB History    No data available     Review of Systems  HENT: Positive for congestion and sinus pressure.   Respiratory: Positive for cough.    Allergies  Review of patient's allergies indicates no known allergies.  Home Medications   Prior to Admission medications   Medication Sig Start Date End Date Taking? Authorizing Provider  azaTHIOprine (IMURAN) 50 MG tablet TAKE 4 TABLETS BY MOUTH EVERY DAY 03/03/13   Lucille Passy, MD  azithromycin (ZITHROMAX) 250 MG tablet Take 1 tablet (250 mg total) by mouth daily. Take first 2 tablets together, then  1 every day until finished. 05/10/15   Coral Spikes, DO  Blood Glucose Monitoring Suppl (ONE TOUCH ULTRA SYSTEM KIT) W/DEVICE KIT Test blood sugar as directed Dx E09.9 04/28/14   Lucille Passy, MD  calcium-vitamin D (OSCAL WITH D) 500-200 MG-UNIT per tablet Take 1 tablet by mouth.    Historical Provider, MD  gabapentin (NEURONTIN) 300 MG capsule Take 300 mg by mouth 3 (three) times daily as needed.     Historical Provider, MD  glucose blood test strip Use as instructed 04/24/14   Lucille Passy, MD  ibuprofen (ADVIL,MOTRIN) 800 MG tablet TAKE 1 TABLET (800 MG TOTAL) BY MOUTH DAILY. 01/31/15   Lucille Passy, MD  lactulose (CHRONULAC) 10 GM/15ML solution TAKE 30 ML BY MOUTH 4 TIMES DAILY AS NEEDED 09/26/14   Lucille Passy, MD  lidocaine (LIDODERM) 5 % Place 1 patch onto the skin daily. Remove & Discard patch within 12 hours or as directed by MD 09/22/12   Owens Loffler, MD  metFORMIN (GLUCOPHAGE) 500 MG tablet TAKE 1 TABLET (500 MG TOTAL) BY MOUTH DAILY WITH BREAKFAST. 02/14/15   Lucille Passy, MD  neomycin (MYCIFRADIN) 500 MG tablet Take 1,000 mg by mouth 2 (two) times daily.    Historical Provider, MD  nystatin (MYCOSTATIN) 100000 UNIT/ML suspension Take 5 mLs (500,000 Units total) by mouth 4 (four) times daily. 05/10/15   Coral Spikes, DO  omeprazole (PRILOSEC) 20 MG capsule TAKE 1 CAPSULE (  20 MG TOTAL) BY MOUTH DAILY. 05/04/15   Lucille Passy, MD  predniSONE (DELTASONE) 5 MG tablet TAKE 1/2 TABLET ONCE DAILY 02/21/15   Lucille Passy, MD  PROAIR HFA 108 [phone] BASE) MCG/ACT inhaler INHALE 2 PUFFS AS DIRECTED 60 SECONDS APART 08/22/13   Lucille Passy, MD  spironolactone (ALDACTONE) 50 MG tablet TAKE 1 TABLET BY MOUTH EVERY DAY 03/06/15   Lucille Passy, MD  traMADol (ULTRAM) 50 MG tablet TAKE 1 TABLET BY MOUTH EVERY 8 HOURS AS NEEDED 11/18/13   Lucille Passy, MD  vitamin B-12 (CYANOCOBALAMIN) 100 MCG tablet Take 100 mcg by mouth daily.    Historical Provider, MD   Meds Ordered and Administered this Visit   Medications - No data to display  BP 131/92 mmHg  Pulse 76  Temp(Src) 98 F (36.7 C) (Oral)  Resp 16  Ht 5' 6"  (1.676 m)  Wt 192 lb (87.091 kg)  BMI 31.00 kg/m2  SpO2 98% No data found.  Physical Exam  Constitutional: She appears well-developed. No distress.  HENT:  Head: Normocephalic and atraumatic.  Mouth/Throat: No oropharyngeal exudate.  Normal TM's bilaterally.   Neck: Neck supple.  Cardiovascular: Normal rate and regular rhythm.   Pulmonary/Chest: Effort normal.  Scattered wheezing noted.  Lymphadenopathy:    She has no cervical adenopathy.  Neurological: She is alert.  Psychiatric: She has a normal mood and affect.  Vitals reviewed.   ED Course  Procedures (including critical care time)  Labs Review Labs Reviewed - No data to display  Imaging Review No results found.  MDM   1. Acute bronchitis, unspecified organism    59 year old female presents with findings consistent with acute bronchitis. Given immunosuppression, treating with Azithromycin. Patient has prednisone at home. I advised a taper starting at 50 mg (decreasing by 10 mg daily; once at 10 mg daily tapered down to 5 and then to 2.5 which is the patient's normal dose).  Nystatin given as patient is prone to thrush.    Coral Spikes, DO 05/10/15 2044

## 2015-05-10 NOTE — ED Notes (Signed)
Patient c/o cough and chest congestion for almost 2 weeks.

## 2015-05-10 NOTE — Discharge Instructions (Signed)
Take the azithromycin as prescribed.  Use the nystatin if needed.  Taper the prednisone as we discussed. 50, 40, 30, 20, 10, 5, then back to 2.5.  Follow up closely with PCP.  Take care  Dr. Lacinda Axon

## 2015-06-25 DIAGNOSIS — Z79899 Other long term (current) drug therapy: Secondary | ICD-10-CM | POA: Diagnosis not present

## 2015-06-25 DIAGNOSIS — M25842 Other specified joint disorders, left hand: Secondary | ICD-10-CM | POA: Diagnosis not present

## 2015-06-25 DIAGNOSIS — M5137 Other intervertebral disc degeneration, lumbosacral region: Secondary | ICD-10-CM | POA: Diagnosis not present

## 2015-06-25 DIAGNOSIS — M79641 Pain in right hand: Secondary | ICD-10-CM | POA: Diagnosis not present

## 2015-06-25 DIAGNOSIS — K7469 Other cirrhosis of liver: Secondary | ICD-10-CM | POA: Diagnosis not present

## 2015-06-25 DIAGNOSIS — Z7952 Long term (current) use of systemic steroids: Secondary | ICD-10-CM | POA: Diagnosis not present

## 2015-06-25 DIAGNOSIS — E559 Vitamin D deficiency, unspecified: Secondary | ICD-10-CM | POA: Diagnosis not present

## 2015-06-25 DIAGNOSIS — Z7984 Long term (current) use of oral hypoglycemic drugs: Secondary | ICD-10-CM | POA: Diagnosis not present

## 2015-06-25 DIAGNOSIS — M79642 Pain in left hand: Secondary | ICD-10-CM | POA: Diagnosis not present

## 2015-06-25 DIAGNOSIS — M1990 Unspecified osteoarthritis, unspecified site: Secondary | ICD-10-CM | POA: Diagnosis not present

## 2015-06-25 DIAGNOSIS — M47817 Spondylosis without myelopathy or radiculopathy, lumbosacral region: Secondary | ICD-10-CM | POA: Diagnosis not present

## 2015-06-25 DIAGNOSIS — M25841 Other specified joint disorders, right hand: Secondary | ICD-10-CM | POA: Diagnosis not present

## 2015-06-25 DIAGNOSIS — K754 Autoimmune hepatitis: Secondary | ICD-10-CM | POA: Diagnosis not present

## 2015-06-25 DIAGNOSIS — M064 Inflammatory polyarthropathy: Secondary | ICD-10-CM | POA: Diagnosis not present

## 2015-06-26 DIAGNOSIS — B079 Viral wart, unspecified: Secondary | ICD-10-CM | POA: Diagnosis not present

## 2015-07-19 DIAGNOSIS — M461 Sacroiliitis, not elsewhere classified: Secondary | ICD-10-CM | POA: Diagnosis not present

## 2015-07-24 DIAGNOSIS — B079 Viral wart, unspecified: Secondary | ICD-10-CM | POA: Diagnosis not present

## 2015-08-18 ENCOUNTER — Other Ambulatory Visit: Payer: Self-pay | Admitting: Family Medicine

## 2015-08-20 NOTE — Telephone Encounter (Signed)
Last f/u 01/2015 

## 2015-08-28 DIAGNOSIS — F17210 Nicotine dependence, cigarettes, uncomplicated: Secondary | ICD-10-CM | POA: Diagnosis not present

## 2015-08-28 DIAGNOSIS — K7589 Other specified inflammatory liver diseases: Secondary | ICD-10-CM | POA: Diagnosis not present

## 2015-08-28 DIAGNOSIS — E119 Type 2 diabetes mellitus without complications: Secondary | ICD-10-CM | POA: Diagnosis not present

## 2015-08-28 DIAGNOSIS — J069 Acute upper respiratory infection, unspecified: Secondary | ICD-10-CM | POA: Diagnosis not present

## 2015-08-28 DIAGNOSIS — R05 Cough: Secondary | ICD-10-CM | POA: Diagnosis not present

## 2015-08-28 DIAGNOSIS — M064 Inflammatory polyarthropathy: Secondary | ICD-10-CM | POA: Diagnosis not present

## 2015-08-29 DIAGNOSIS — Z7984 Long term (current) use of oral hypoglycemic drugs: Secondary | ICD-10-CM | POA: Diagnosis not present

## 2015-08-29 DIAGNOSIS — M329 Systemic lupus erythematosus, unspecified: Secondary | ICD-10-CM | POA: Diagnosis not present

## 2015-08-29 DIAGNOSIS — E119 Type 2 diabetes mellitus without complications: Secondary | ICD-10-CM | POA: Diagnosis not present

## 2015-08-29 DIAGNOSIS — K838 Other specified diseases of biliary tract: Secondary | ICD-10-CM | POA: Diagnosis not present

## 2015-08-29 DIAGNOSIS — R140 Abdominal distension (gaseous): Secondary | ICD-10-CM | POA: Diagnosis not present

## 2015-08-29 DIAGNOSIS — K7689 Other specified diseases of liver: Secondary | ICD-10-CM | POA: Diagnosis not present

## 2015-08-29 DIAGNOSIS — K7469 Other cirrhosis of liver: Secondary | ICD-10-CM | POA: Diagnosis not present

## 2015-08-29 DIAGNOSIS — K8020 Calculus of gallbladder without cholecystitis without obstruction: Secondary | ICD-10-CM | POA: Diagnosis not present

## 2015-08-29 DIAGNOSIS — Z7952 Long term (current) use of systemic steroids: Secondary | ICD-10-CM | POA: Diagnosis not present

## 2015-08-29 DIAGNOSIS — K754 Autoimmune hepatitis: Secondary | ICD-10-CM | POA: Diagnosis not present

## 2015-08-29 DIAGNOSIS — Z79899 Other long term (current) drug therapy: Secondary | ICD-10-CM | POA: Diagnosis not present

## 2015-09-04 DIAGNOSIS — D485 Neoplasm of uncertain behavior of skin: Secondary | ICD-10-CM | POA: Diagnosis not present

## 2015-09-06 ENCOUNTER — Encounter: Payer: Self-pay | Admitting: Family Medicine

## 2015-09-06 ENCOUNTER — Ambulatory Visit (INDEPENDENT_AMBULATORY_CARE_PROVIDER_SITE_OTHER): Payer: Medicare Other | Admitting: Family Medicine

## 2015-09-06 VITALS — BP 104/66 | HR 80 | Temp 98.4°F | Ht 66.0 in | Wt 199.0 lb

## 2015-09-06 DIAGNOSIS — M329 Systemic lupus erythematosus, unspecified: Secondary | ICD-10-CM | POA: Diagnosis not present

## 2015-09-06 DIAGNOSIS — M25562 Pain in left knee: Secondary | ICD-10-CM

## 2015-09-06 DIAGNOSIS — K754 Autoimmune hepatitis: Secondary | ICD-10-CM | POA: Diagnosis not present

## 2015-09-06 MED ORDER — METHYLPREDNISOLONE ACETATE 40 MG/ML IJ SUSP
80.0000 mg | Freq: Once | INTRAMUSCULAR | Status: AC
  Administered 2015-09-06: 80 mg via INTRA_ARTICULAR

## 2015-09-06 NOTE — Progress Notes (Signed)
Dr. Frederico Hamman T. Mya Suell, MD, [facility] [place], [address] Phone: [phone] Fax: [phone]  09/06/2015  Patient: Shannon [name], MRN: [id number], DOB: Nov 28, 1955, 59 y.o.  Primary Physician:  Arnette Norris, MD  Chief Complaint: Knee Pain  Subjective:   Shannon [name] is a 60 y.o. very pleasant female patient who presents with the following:  Recent SLE dx.   She has autoimmune hepatitis, and she is on Imuran for that. She also has systemic lupus, and she was just started on plaque when L. She has multiple polyarthralgia. Primarily, today she complains of left knee pain, and she has known tricompartmental arthritis as well as a medial meniscal tear seen on prior MRI.  02/08/2015 Last OV with Owens Loffler, MD  L knee pain, known meniscal tear and known tricompartmental OA:  Knee bothering her quite a lot.  Still ongoing back issues, too.  She is on prednisone and Imuran for autoimmune hepatitis. Her chart also indicates that the patient has systemic lupus.  She is also complaining of left knee pain, and she previously has an MRI that I reviewed, and she does have a medial meniscal tear, and additionally she also has tricompartmental arthritis. Her knee has been hurting her and getting worse over the last year, she is limping quite a bit. No locking up. No mechanical symptoms.  Motion ok  Past Medical History, Surgical History, Social History, Family History, Problem List, Medications, and Allergies have been reviewed and updated if relevant.  Patient Active Problem List   Diagnosis Date Noted  . Portal hypertensive gastropathy 01/23/2015  . Fatigue 01/23/2015  . HLD (hyperlipidemia) 11/23/2013  . Smoker 11/23/2013  . Fibromyalgia 08/16/2013  . Abdominal bloating 08/16/2013  . Cough 12/23/2012  . Polyarthralgia 11/23/2012  . Lumbosacral radiculopathy at L5 03/26/2012  . OA (osteoarthritis) of knee  03/26/2012  . Thrush 10/08/2011  . Hyperpigmentation 11/04/2010  . Adjustment disorder 11/04/2010  . Steroid-induced diabetes ([place]) 05/31/2010  . VITAMIN D DEFICIENCY 05/31/2010  . AUTOIMMUNE HEPATITIS 05/31/2010  . SLE 05/31/2010    Past Medical History  Diagnosis Date  . Diabetes mellitus without complication ([place])   . GERD (gastroesophageal reflux disease)   . Liver disease   . Autoimmune hepatitis ([place])   . Osteoarthritis     Past Surgical History  Procedure Laterality Date  . Laparoscopic hysterectomy    . Tubal ligation    . Back surgery    . Abdominal hysterectomy      Social History   Social History  . Marital Status: Legally Separated    Spouse Name: N/A  . Number of Children: N/A  . Years of Education: N/A   Occupational History  . Not on file.   Social History Main Topics  . Smoking status: Current Every Day Smoker -- 0.25 packs/day for 20 years    Types: Cigarettes  . Smokeless tobacco: Never Used  . Alcohol Use: No  . Drug Use: No  . Sexual Activity: Not on file   Other Topics Concern  . Not on file   Social History Narrative    Family History  Problem Relation Age of Onset  . Heart disease Father   . Hypertension Father   . Hypertension Sister     No Known Allergies  Medication list reviewed and updated in full in [facility].  GEN: No fevers, chills. Nontoxic. Primarily MSK c/o today. MSK: Detailed in the HPI GI:  tolerating PO intake without difficulty Neuro: No numbness, parasthesias, or tingling associated. Otherwise the pertinent positives of the ROS are noted above.   Objective:   BP 104/66 mmHg  Pulse 80  Temp(Src) 98.4 F (36.9 C) (Oral)  Ht _0  (1.676 m)  Wt 199 lb (90.266 kg)  BMI 32.13 kg/m2   GEN: WDWN, NAD, Non-toxic, Alert & Oriented x 3 HEENT: Atraumatic, Normocephalic.  Ears and Nose: No external deformity. EXTR: No clubbing/cyanosis/edema NEURO: Normal gait.  PSYCH: Normally interactive.  Conversant. Not depressed or anxious appearing.  Calm demeanor.   Knee:  L Gait: Normal heel toe pattern ROM: 0-125 Effusion: neg Echymosis or edema: none Patellar tendon NT Painful PLICA: neg Patellar grind: negative Medial and lateral patellar facet loading: mild TTP medial and lateral joint lines: medial > lateral pain Mcmurray's neg Flexion-pinch pain Varus and valgus stress: stable Lachman: neg Ant and Post drawer: neg Hip abduction, IR, ER: WNL Hip flexion str: 5/5 Hip abd: 5/5 Quad: 5/5 VMO atrophy:No Hamstring concentric and eccentric: 5/5   Radiology: Reviewed  Assessment and Plan:   Left knee pain - Plan: methylPREDNISolone acetate (DEPO-MEDROL) injection 80 mg  AUTOIMMUNE HEPATITIS  Systemic lupus erythematosus ([place])  Notes from [facility] reviewed.  Knee Injection, L Patient verbally consented to procedure. Risks (including potential rare risk of infection), benefits, and alternatives explained. Sterilely prepped with Chloraprep. Ethyl cholride used for anesthesia. 8 cc Lidocaine 1% mixed with 2 mL Depo-Medrol 40 mg injected using the anteromedial approach without difficulty. No complications with procedure and tolerated well. Patient had decreased pain post-injection.   Follow-up: prn  Signed,  Jaggar Benko T. Derrius Furtick, MD   Patient's Medications  New Prescriptions   No medications on file  Previous Medications   AZATHIOPRINE (IMURAN) 50 MG TABLET    TAKE 4 TABLETS BY MOUTH EVERY DAY   AZITHROMYCIN (ZITHROMAX) 250 MG TABLET    Take 1 tablet (250 mg total) by mouth daily. Take first 2 tablets together, then 1 every day until finished.   BLOOD GLUCOSE MONITORING SUPPL (ONE TOUCH ULTRA SYSTEM KIT) W/DEVICE KIT    Test blood sugar as directed Dx E09.9   CALCIUM-VITAMIN D (OSCAL WITH D) 500-200 MG-UNIT PER TABLET    Take 1 tablet by mouth.   GABAPENTIN (NEURONTIN) 300 MG CAPSULE    Take 300 mg by mouth 3 (three) times daily as needed.    GLUCOSE BLOOD TEST STRIP     Use as instructed   HYDROXYCHLOROQUINE (PLAQUENIL) 200 MG TABLET    TAKE 1 TABLET (200 MG TOTAL) BY MOUTH TWO (2) TIMES A DAY.   IBUPROFEN (ADVIL,MOTRIN) 800 MG TABLET    TAKE 1 TABLET (800 MG TOTAL) BY MOUTH DAILY.   LACTULOSE (CHRONULAC) 10 GM/15ML SOLUTION    TAKE 30 ML BY MOUTH 4 TIMES DAILY AS NEEDED   LIDOCAINE (LIDODERM) 5 %    Place 1 patch onto the skin daily. Remove & Discard patch within 12 hours or as directed by MD   METFORMIN (GLUCOPHAGE) 500 MG TABLET    TAKE 1 TABLET (500 MG TOTAL) BY MOUTH DAILY WITH BREAKFAST.   NEOMYCIN (MYCIFRADIN) 500 MG TABLET    Take 1,000 mg by mouth 2 (two) times daily.   NYSTATIN (MYCOSTATIN) 100000 UNIT/ML SUSPENSION    Take 5 mLs (500,000 Units total) by mouth 4 (four) times daily.   OMEPRAZOLE (PRILOSEC) 20 MG CAPSULE    TAKE 1 CAPSULE (20 MG TOTAL) BY MOUTH DAILY.   PREDNISONE (DELTASONE) 5 MG TABLET  TAKE 1/2 TABLET ONCE DAILY   PROAIR HFA 108 (90 BASE) MCG/ACT INHALER    INHALE 2 PUFFS AS DIRECTED 60 SECONDS APART   SPIRONOLACTONE (ALDACTONE) 50 MG TABLET    TAKE 1 TABLET BY MOUTH EVERY DAY   TRAMADOL (ULTRAM) 50 MG TABLET    TAKE 1 TABLET BY MOUTH EVERY 8 HOURS AS NEEDED   VITAMIN B-12 (CYANOCOBALAMIN) 100 MCG TABLET    Take 100 mcg by mouth daily.  Modified Medications   No medications on file  Discontinued Medications   No medications on file

## 2015-09-06 NOTE — Progress Notes (Signed)
Pre visit review using our clinic review tool, if applicable. No additional management support is needed unless otherwise documented below in the visit note. 

## 2015-09-27 DIAGNOSIS — M064 Inflammatory polyarthropathy: Secondary | ICD-10-CM | POA: Diagnosis not present

## 2015-09-27 DIAGNOSIS — Z79818 Long term (current) use of other agents affecting estrogen receptors and estrogen levels: Secondary | ICD-10-CM | POA: Diagnosis not present

## 2015-09-27 DIAGNOSIS — E119 Type 2 diabetes mellitus without complications: Secondary | ICD-10-CM | POA: Diagnosis not present

## 2015-09-27 DIAGNOSIS — K754 Autoimmune hepatitis: Secondary | ICD-10-CM | POA: Diagnosis not present

## 2015-10-01 DIAGNOSIS — M170 Bilateral primary osteoarthritis of knee: Secondary | ICD-10-CM | POA: Diagnosis not present

## 2015-10-01 DIAGNOSIS — R5383 Other fatigue: Secondary | ICD-10-CM | POA: Diagnosis not present

## 2015-10-01 DIAGNOSIS — Z79899 Other long term (current) drug therapy: Secondary | ICD-10-CM | POA: Diagnosis not present

## 2015-10-01 DIAGNOSIS — M329 Systemic lupus erythematosus, unspecified: Secondary | ICD-10-CM | POA: Diagnosis not present

## 2015-10-01 DIAGNOSIS — M461 Sacroiliitis, not elsewhere classified: Secondary | ICD-10-CM | POA: Diagnosis not present

## 2015-10-05 DIAGNOSIS — E2839 Other primary ovarian failure: Secondary | ICD-10-CM | POA: Diagnosis not present

## 2015-10-19 DIAGNOSIS — M461 Sacroiliitis, not elsewhere classified: Secondary | ICD-10-CM | POA: Diagnosis not present

## 2015-10-24 DIAGNOSIS — M329 Systemic lupus erythematosus, unspecified: Secondary | ICD-10-CM | POA: Diagnosis not present

## 2015-10-24 DIAGNOSIS — Z79899 Other long term (current) drug therapy: Secondary | ICD-10-CM | POA: Diagnosis not present

## 2016-01-12 ENCOUNTER — Other Ambulatory Visit: Payer: Self-pay | Admitting: Family Medicine

## 2016-02-04 DIAGNOSIS — M461 Sacroiliitis, not elsewhere classified: Secondary | ICD-10-CM | POA: Diagnosis not present

## 2016-02-04 DIAGNOSIS — M329 Systemic lupus erythematosus, unspecified: Secondary | ICD-10-CM | POA: Diagnosis not present

## 2016-02-04 DIAGNOSIS — M1712 Unilateral primary osteoarthritis, left knee: Secondary | ICD-10-CM | POA: Diagnosis not present

## 2016-02-04 DIAGNOSIS — M25559 Pain in unspecified hip: Secondary | ICD-10-CM | POA: Diagnosis not present

## 2016-02-04 DIAGNOSIS — M7060 Trochanteric bursitis, unspecified hip: Secondary | ICD-10-CM | POA: Diagnosis not present

## 2016-02-04 DIAGNOSIS — M179 Osteoarthritis of knee, unspecified: Secondary | ICD-10-CM | POA: Diagnosis not present

## 2016-02-04 DIAGNOSIS — K754 Autoimmune hepatitis: Secondary | ICD-10-CM | POA: Diagnosis not present

## 2016-02-04 DIAGNOSIS — M25562 Pain in left knee: Secondary | ICD-10-CM | POA: Diagnosis not present

## 2016-02-04 DIAGNOSIS — Z7984 Long term (current) use of oral hypoglycemic drugs: Secondary | ICD-10-CM | POA: Diagnosis not present

## 2016-02-04 DIAGNOSIS — Z7952 Long term (current) use of systemic steroids: Secondary | ICD-10-CM | POA: Diagnosis not present

## 2016-02-27 DIAGNOSIS — K754 Autoimmune hepatitis: Secondary | ICD-10-CM | POA: Diagnosis not present

## 2016-02-27 DIAGNOSIS — K7469 Other cirrhosis of liver: Secondary | ICD-10-CM | POA: Diagnosis not present

## 2016-03-03 DIAGNOSIS — R160 Hepatomegaly, not elsewhere classified: Secondary | ICD-10-CM | POA: Diagnosis not present

## 2016-03-03 DIAGNOSIS — K754 Autoimmune hepatitis: Secondary | ICD-10-CM | POA: Diagnosis not present

## 2016-03-03 DIAGNOSIS — K8020 Calculus of gallbladder without cholecystitis without obstruction: Secondary | ICD-10-CM | POA: Diagnosis not present

## 2016-03-03 DIAGNOSIS — K7469 Other cirrhosis of liver: Secondary | ICD-10-CM | POA: Diagnosis not present

## 2016-04-24 DIAGNOSIS — K7469 Other cirrhosis of liver: Secondary | ICD-10-CM | POA: Diagnosis not present

## 2016-04-24 DIAGNOSIS — K754 Autoimmune hepatitis: Secondary | ICD-10-CM | POA: Diagnosis not present

## 2016-06-05 DIAGNOSIS — J209 Acute bronchitis, unspecified: Secondary | ICD-10-CM | POA: Diagnosis not present

## 2016-06-05 DIAGNOSIS — R05 Cough: Secondary | ICD-10-CM | POA: Diagnosis not present

## 2016-06-05 DIAGNOSIS — E119 Type 2 diabetes mellitus without complications: Secondary | ICD-10-CM | POA: Diagnosis not present

## 2016-06-05 DIAGNOSIS — N390 Urinary tract infection, site not specified: Secondary | ICD-10-CM | POA: Diagnosis not present

## 2016-07-08 DIAGNOSIS — Z1211 Encounter for screening for malignant neoplasm of colon: Secondary | ICD-10-CM | POA: Diagnosis not present

## 2016-07-08 DIAGNOSIS — K635 Polyp of colon: Secondary | ICD-10-CM | POA: Diagnosis not present

## 2016-07-08 DIAGNOSIS — K219 Gastro-esophageal reflux disease without esophagitis: Secondary | ICD-10-CM | POA: Diagnosis not present

## 2016-07-08 DIAGNOSIS — Z7984 Long term (current) use of oral hypoglycemic drugs: Secondary | ICD-10-CM | POA: Diagnosis not present

## 2016-07-08 DIAGNOSIS — D122 Benign neoplasm of ascending colon: Secondary | ICD-10-CM | POA: Diagnosis not present

## 2016-07-08 DIAGNOSIS — K5730 Diverticulosis of large intestine without perforation or abscess without bleeding: Secondary | ICD-10-CM | POA: Diagnosis not present

## 2016-07-08 DIAGNOSIS — Z79899 Other long term (current) drug therapy: Secondary | ICD-10-CM | POA: Diagnosis not present

## 2016-07-08 DIAGNOSIS — L932 Other local lupus erythematosus: Secondary | ICD-10-CM | POA: Diagnosis not present

## 2016-07-08 DIAGNOSIS — K7460 Unspecified cirrhosis of liver: Secondary | ICD-10-CM | POA: Diagnosis not present

## 2016-07-08 DIAGNOSIS — J45909 Unspecified asthma, uncomplicated: Secondary | ICD-10-CM | POA: Diagnosis not present

## 2016-07-08 DIAGNOSIS — M1990 Unspecified osteoarthritis, unspecified site: Secondary | ICD-10-CM | POA: Diagnosis not present

## 2016-07-08 DIAGNOSIS — K754 Autoimmune hepatitis: Secondary | ICD-10-CM | POA: Diagnosis not present

## 2016-07-08 DIAGNOSIS — K648 Other hemorrhoids: Secondary | ICD-10-CM | POA: Diagnosis not present

## 2016-07-08 DIAGNOSIS — E119 Type 2 diabetes mellitus without complications: Secondary | ICD-10-CM | POA: Diagnosis not present

## 2016-07-08 DIAGNOSIS — D123 Benign neoplasm of transverse colon: Secondary | ICD-10-CM | POA: Diagnosis not present

## 2016-07-08 DIAGNOSIS — Z86010 Personal history of colonic polyps: Secondary | ICD-10-CM | POA: Diagnosis not present

## 2016-07-08 LAB — HM COLONOSCOPY

## 2016-08-20 DIAGNOSIS — M89351 Hypertrophy of bone, right femur: Secondary | ICD-10-CM | POA: Diagnosis not present

## 2016-08-20 DIAGNOSIS — M85851 Other specified disorders of bone density and structure, right thigh: Secondary | ICD-10-CM | POA: Diagnosis not present

## 2016-08-20 DIAGNOSIS — M25551 Pain in right hip: Secondary | ICD-10-CM | POA: Diagnosis not present

## 2016-08-20 DIAGNOSIS — M25851 Other specified joint disorders, right hip: Secondary | ICD-10-CM | POA: Diagnosis not present

## 2016-09-02 DIAGNOSIS — R932 Abnormal findings on diagnostic imaging of liver and biliary tract: Secondary | ICD-10-CM | POA: Diagnosis not present

## 2016-09-02 DIAGNOSIS — K7460 Unspecified cirrhosis of liver: Secondary | ICD-10-CM | POA: Diagnosis not present

## 2016-09-02 DIAGNOSIS — Z7984 Long term (current) use of oral hypoglycemic drugs: Secondary | ICD-10-CM | POA: Diagnosis not present

## 2016-09-02 DIAGNOSIS — Z79899 Other long term (current) drug therapy: Secondary | ICD-10-CM | POA: Diagnosis not present

## 2016-09-02 DIAGNOSIS — K8020 Calculus of gallbladder without cholecystitis without obstruction: Secondary | ICD-10-CM | POA: Diagnosis not present

## 2016-09-02 DIAGNOSIS — Z1289 Encounter for screening for malignant neoplasm of other sites: Secondary | ICD-10-CM | POA: Diagnosis not present

## 2016-09-02 DIAGNOSIS — K7689 Other specified diseases of liver: Secondary | ICD-10-CM | POA: Diagnosis not present

## 2016-09-02 DIAGNOSIS — R600 Localized edema: Secondary | ICD-10-CM | POA: Diagnosis not present

## 2016-09-02 DIAGNOSIS — K754 Autoimmune hepatitis: Secondary | ICD-10-CM | POA: Diagnosis not present

## 2016-09-02 DIAGNOSIS — M329 Systemic lupus erythematosus, unspecified: Secondary | ICD-10-CM | POA: Diagnosis not present

## 2016-09-02 DIAGNOSIS — Z7952 Long term (current) use of systemic steroids: Secondary | ICD-10-CM | POA: Diagnosis not present

## 2016-09-02 DIAGNOSIS — K7290 Hepatic failure, unspecified without coma: Secondary | ICD-10-CM | POA: Diagnosis not present

## 2016-09-02 DIAGNOSIS — E119 Type 2 diabetes mellitus without complications: Secondary | ICD-10-CM | POA: Diagnosis not present

## 2016-10-20 DIAGNOSIS — M545 Low back pain: Secondary | ICD-10-CM | POA: Diagnosis not present

## 2016-10-20 DIAGNOSIS — M3219 Other organ or system involvement in systemic lupus erythematosus: Secondary | ICD-10-CM | POA: Diagnosis not present

## 2016-10-20 DIAGNOSIS — E559 Vitamin D deficiency, unspecified: Secondary | ICD-10-CM | POA: Diagnosis not present

## 2016-10-20 DIAGNOSIS — K754 Autoimmune hepatitis: Secondary | ICD-10-CM | POA: Diagnosis not present

## 2016-11-24 DIAGNOSIS — E119 Type 2 diabetes mellitus without complications: Secondary | ICD-10-CM | POA: Diagnosis not present

## 2016-11-24 DIAGNOSIS — L729 Follicular cyst of the skin and subcutaneous tissue, unspecified: Secondary | ICD-10-CM | POA: Diagnosis not present

## 2016-11-24 DIAGNOSIS — K5900 Constipation, unspecified: Secondary | ICD-10-CM | POA: Diagnosis not present

## 2016-11-24 DIAGNOSIS — K754 Autoimmune hepatitis: Secondary | ICD-10-CM | POA: Diagnosis not present

## 2017-01-14 DIAGNOSIS — K7469 Other cirrhosis of liver: Secondary | ICD-10-CM | POA: Diagnosis not present

## 2017-01-14 DIAGNOSIS — K7460 Unspecified cirrhosis of liver: Secondary | ICD-10-CM | POA: Diagnosis not present

## 2017-01-14 DIAGNOSIS — K754 Autoimmune hepatitis: Secondary | ICD-10-CM | POA: Diagnosis not present

## 2017-03-04 DIAGNOSIS — K754 Autoimmune hepatitis: Secondary | ICD-10-CM | POA: Diagnosis not present

## 2017-03-04 DIAGNOSIS — K8020 Calculus of gallbladder without cholecystitis without obstruction: Secondary | ICD-10-CM | POA: Diagnosis not present

## 2017-03-04 DIAGNOSIS — K7689 Other specified diseases of liver: Secondary | ICD-10-CM | POA: Diagnosis not present

## 2017-03-04 DIAGNOSIS — Z79899 Other long term (current) drug therapy: Secondary | ICD-10-CM | POA: Diagnosis not present

## 2017-03-04 DIAGNOSIS — K7460 Unspecified cirrhosis of liver: Secondary | ICD-10-CM | POA: Diagnosis not present

## 2017-03-04 DIAGNOSIS — R932 Abnormal findings on diagnostic imaging of liver and biliary tract: Secondary | ICD-10-CM | POA: Diagnosis not present

## 2017-03-04 DIAGNOSIS — K7469 Other cirrhosis of liver: Secondary | ICD-10-CM | POA: Diagnosis not present

## 2017-03-09 DIAGNOSIS — K754 Autoimmune hepatitis: Secondary | ICD-10-CM | POA: Diagnosis not present

## 2017-03-09 DIAGNOSIS — Z9181 History of falling: Secondary | ICD-10-CM | POA: Diagnosis not present

## 2017-03-09 DIAGNOSIS — M25511 Pain in right shoulder: Secondary | ICD-10-CM | POA: Diagnosis not present

## 2017-03-09 DIAGNOSIS — M25819 Other specified joint disorders, unspecified shoulder: Secondary | ICD-10-CM | POA: Diagnosis not present

## 2017-03-09 DIAGNOSIS — M461 Sacroiliitis, not elsewhere classified: Secondary | ICD-10-CM | POA: Diagnosis not present

## 2017-03-09 DIAGNOSIS — M3219 Other organ or system involvement in systemic lupus erythematosus: Secondary | ICD-10-CM | POA: Diagnosis not present

## 2017-03-09 DIAGNOSIS — M19011 Primary osteoarthritis, right shoulder: Secondary | ICD-10-CM | POA: Diagnosis not present

## 2017-03-11 DIAGNOSIS — Z79899 Other long term (current) drug therapy: Secondary | ICD-10-CM | POA: Diagnosis not present

## 2017-03-11 DIAGNOSIS — Z23 Encounter for immunization: Secondary | ICD-10-CM | POA: Diagnosis not present

## 2017-03-11 DIAGNOSIS — M7581 Other shoulder lesions, right shoulder: Secondary | ICD-10-CM | POA: Diagnosis not present

## 2017-03-11 DIAGNOSIS — M75101 Unspecified rotator cuff tear or rupture of right shoulder, not specified as traumatic: Secondary | ICD-10-CM | POA: Diagnosis not present

## 2017-03-11 DIAGNOSIS — M25511 Pain in right shoulder: Secondary | ICD-10-CM | POA: Diagnosis not present

## 2017-03-16 DIAGNOSIS — M7581 Other shoulder lesions, right shoulder: Secondary | ICD-10-CM | POA: Diagnosis not present

## 2017-03-16 DIAGNOSIS — M25511 Pain in right shoulder: Secondary | ICD-10-CM | POA: Diagnosis not present

## 2017-04-13 DIAGNOSIS — M5416 Radiculopathy, lumbar region: Secondary | ICD-10-CM | POA: Diagnosis not present

## 2017-04-13 DIAGNOSIS — M461 Sacroiliitis, not elsewhere classified: Secondary | ICD-10-CM | POA: Diagnosis not present

## 2017-07-13 DIAGNOSIS — K7469 Other cirrhosis of liver: Secondary | ICD-10-CM | POA: Diagnosis not present

## 2017-07-13 DIAGNOSIS — K754 Autoimmune hepatitis: Secondary | ICD-10-CM | POA: Diagnosis not present

## 2017-07-13 DIAGNOSIS — M3219 Other organ or system involvement in systemic lupus erythematosus: Secondary | ICD-10-CM | POA: Diagnosis not present

## 2017-07-18 ENCOUNTER — Other Ambulatory Visit: Payer: Self-pay

## 2017-07-18 ENCOUNTER — Ambulatory Visit
Admission: EM | Admit: 2017-07-18 | Discharge: 2017-07-18 | Disposition: A | Discharge status: Home / Self Care | Payer: Medicare Other | Attending: Family Medicine | Admitting: Family Medicine

## 2017-07-18 DIAGNOSIS — H6123 Impacted cerumen, bilateral: Secondary | ICD-10-CM | POA: Diagnosis not present

## 2017-07-18 DIAGNOSIS — J069 Acute upper respiratory infection, unspecified: Secondary | ICD-10-CM

## 2017-07-18 MED ORDER — AZITHROMYCIN 250 MG PO TABS: 250.0000 mg | ORAL_TABLET | Freq: Every day | ORAL | 0 refills | Status: DC

## 2017-07-18 MED ORDER — BENZONATATE 200 MG PO CAPS: ORAL_CAPSULE | ORAL | 0 refills | Status: DC

## 2017-07-18 MED ORDER — HYDROCOD POLST-CPM POLST ER 10-8 MG/5ML PO SUER: 5.0000 mL | Freq: Two times a day (BID) | ORAL | 0 refills | Status: DC

## 2017-07-18 NOTE — ED Provider Notes (Signed)
[facility]    CSN: [id number] Arrival date & time: 07/18/17  0912     History   Chief Complaint Chief Complaint  Patient presents with  . Nasal Congestion    HPI Shannon [name] is a 62 y.o. female.   HPI  62 year old female presents with 3-day history of chills burning sensation in her throat discomfort cough and congestion.  She has numerous comorbidities with immunosuppression.  Was seen last year and treated for bronchitis and she states this is similar.  She was also concerned that this may represent influenza.  When questioned about her temperature she states that it was 98.4 but she always tends to run "low".  He has no shortness of breath.       Past Medical History:  Diagnosis Date  . Autoimmune hepatitis ([place])   . Diabetes mellitus without complication ([place])   . GERD (gastroesophageal reflux disease)   . Liver disease   . Osteoarthritis   . Systemic lupus erythematosus ([place]) 05/31/2010   Qualifier: Diagnosis of  By: Deborra Medina MD, Talia      Patient Active Problem List   Diagnosis Date Noted  . Portal hypertensive gastropathy ([place]) 01/23/2015  . Fatigue 01/23/2015  . HLD (hyperlipidemia) 11/23/2013  . Smoker 11/23/2013  . Fibromyalgia 08/16/2013  . Abdominal bloating 08/16/2013  . Lumbosacral radiculopathy at L5 03/26/2012  . OA (osteoarthritis) of knee 03/26/2012  . Hyperpigmentation 11/04/2010  . Adjustment disorder 11/04/2010  . Steroid-induced diabetes ([place]) 05/31/2010  . VITAMIN D DEFICIENCY 05/31/2010  . Autoimmune hepatitis ([place]) 05/31/2010  . Systemic lupus erythematosus ([place]) 05/31/2010    Past Surgical History:  Procedure Laterality Date  . ABDOMINAL HYSTERECTOMY    . BACK SURGERY    . LAPAROSCOPIC HYSTERECTOMY    . TUBAL LIGATION      OB History    No data available       Home Medications    Prior to Admission medications   Medication Sig Start Date End Date Taking? Authorizing Provider  Blood Glucose Monitoring  Suppl (ONE TOUCH ULTRA SYSTEM KIT) W/DEVICE KIT Test blood sugar as directed Dx E09.9 04/28/14  Yes Lucille Passy, MD  calcium-vitamin D (OSCAL WITH D) 500-200 MG-UNIT per tablet Take 1 tablet by mouth.   Yes [provider]  glucose blood test strip Use as instructed 04/24/14  Yes Lucille Passy, MD  hydroxychloroquine (PLAQUENIL) 200 MG tablet TAKE 1 TABLET (200 MG TOTAL) BY MOUTH TWO (2) TIMES A DAY. 08/02/15  Yes [provider]  ibuprofen (ADVIL,MOTRIN) 800 MG tablet TAKE 1 TABLET (800 MG TOTAL) BY MOUTH DAILY. 01/31/15  Yes Lucille Passy, MD  lactulose (CHRONULAC) 10 GM/15ML solution TAKE 30 ML BY MOUTH 4 TIMES DAILY AS NEEDED 09/26/14  Yes Lucille Passy, MD  metFORMIN (GLUCOPHAGE) 500 MG tablet TAKE 1 TABLET (500 MG TOTAL) BY MOUTH DAILY WITH BREAKFAST. 02/14/15  Yes Lucille Passy, MD  omeprazole (PRILOSEC) 20 MG capsule TAKE 1 CAPSULE (20 MG TOTAL) BY MOUTH DAILY. 01/15/16  Yes Lucille Passy, MD  predniSONE (DELTASONE) 5 MG tablet TAKE 1/2 TABLET ONCE DAILY 08/20/15  Yes Lucille Passy, MD  PROAIR HFA 108 [phone] BASE) MCG/ACT inhaler INHALE 2 PUFFS AS DIRECTED 60 SECONDS APART 08/22/13  Yes Lucille Passy, MD  rifaximin (XIFAXAN) 550 MG TABS tablet Take 550 mg by mouth 2 (two) times daily.   Yes [provider]  spironolactone (ALDACTONE) 50 MG tablet TAKE 1 TABLET BY MOUTH EVERY  DAY Patient taking differently: TAKE 1 TABLET BY MOUTH TWICE A WEEK 03/06/15  Yes Lucille Passy, MD  traMADol (ULTRAM) 50 MG tablet TAKE 1 TABLET BY MOUTH EVERY 8 HOURS AS NEEDED 11/18/13  Yes Lucille Passy, MD  vitamin B-12 (CYANOCOBALAMIN) 100 MCG tablet Take 100 mcg by mouth daily.   Yes [provider]  azithromycin (ZITHROMAX) 250 MG tablet Take 1 tablet (250 mg total) by mouth daily. Take first 2 tablets together, then 1 every day until finished. 07/18/17   Lorin Picket, PA-C  benzonatate (TESSALON) 200 MG capsule Take one cap TID PRN cough 07/18/17   Lorin Picket, PA-C    chlorpheniramine-HYDROcodone [facility] ER) 10-8 MG/5ML SUER Take 5 mLs by mouth 2 (two) times daily. 07/18/17   Lorin Picket, PA-C  gabapentin (NEURONTIN) 300 MG capsule Take 300 mg by mouth 3 (three) times daily as needed.     [provider]  lidocaine (LIDODERM) 5 % Place 1 patch onto the skin daily. Remove & Discard patch within 12 hours or as directed by MD 09/22/12   Copland, Frederico Hamman, MD  nystatin (MYCOSTATIN) 100000 UNIT/ML suspension Take 5 mLs (500,000 Units total) by mouth 4 (four) times daily. 05/10/15   Coral Spikes, DO    Family History Family History  Problem Relation Age of Onset  . Heart disease Father   . Hypertension Father   . Hypertension Sister     Social History Social History   Tobacco Use  . Smoking status: Current Every Day Smoker    Packs/day: 0.25    Years: 20.00    Pack years: 5.00    Types: Cigarettes  . Smokeless tobacco: Never Used  Substance Use Topics  . Alcohol use: No  . Drug use: No     Allergies   Patient has no known allergies.   Review of Systems Review of Systems  Constitutional: Positive for activity change, chills and fatigue. Negative for fever.  HENT: Positive for congestion, sinus pressure and sinus pain.   Respiratory: Positive for cough. Negative for shortness of breath.   All other systems reviewed and are negative.    Physical Exam Triage Vital Signs ED Triage Vitals  Enc Vitals Group     BP 07/18/17 0958 128/75     Pulse Rate 07/18/17 0958 81     Resp --      Temp 07/18/17 0958 98.4 F (36.9 C)     Temp Source 07/18/17 0958 Oral     SpO2 07/18/17 0958 100 %     Weight 07/18/17 0954 192 lb (87.1 kg)     Height 07/18/17 0954 _0  (1.676 m)     Head Circumference --      Peak Flow --      Pain Score 07/18/17 0954 4     Pain Loc --      Pain Edu? --      Excl. in [place]? --    No data found.  Updated Vital Signs BP 128/75 (BP Location: Left Arm)   Pulse 81   Temp 98.4 F (36.9 C)  (Oral)   Ht _1  (1.676 m)   Wt 192 lb (87.1 kg)   SpO2 100%   BMI 30.99 kg/m   Visual Acuity Right Eye Distance:   Left Eye Distance:   Bilateral Distance:    Right Eye Near:   Left Eye Near:    Bilateral Near:     Physical Exam  Constitutional:  She is oriented to person, place, and time. She appears well-developed and well-nourished. No distress.  HENT:  Head: Normocephalic.  Nose: Nose normal.  Mouth/Throat: Oropharynx is clear and moist. No oropharyngeal exudate.  Both ear canals are occluded with cerumen  Eyes: Pupils are equal, round, and reactive to light. Right eye exhibits no discharge. Left eye exhibits no discharge.  Neck: Normal range of motion.  Pulmonary/Chest: Effort normal and breath sounds normal.  Musculoskeletal: Normal range of motion.  Lymphadenopathy:    She has no cervical adenopathy.  Neurological: She is alert and oriented to person, place, and time.  Skin: Skin is warm and dry. She is not diaphoretic.  Psychiatric: She has a normal mood and affect. Her behavior is normal. Judgment and thought content normal.  Nursing note and vitals reviewed.    UC Treatments / Results  Labs (all labs ordered are listed, but only abnormal results are displayed) Labs Reviewed - No data to display  EKG  EKG Interpretation None       Radiology No results found.  Procedures Procedures (including critical care time)  Medications Ordered in UC Medications - No data to display   Initial Impression / Assessment and Plan / UC Course  I have reviewed the triage vital signs and the nursing notes.  Pertinent labs & imaging results that were available during my care of the patient were reviewed by me and considered in my medical decision making (see chart for details).     Plan: 1. Test/x-ray results and diagnosis reviewed with patient 2. rx as per orders; risks, benefits, potential side effects reviewed with patient 3. Recommend supportive treatment  with rest and fluids.  Use Tylenol or Motrin for body aches and fever.Treat  Her cough symptomatically.  I have provided her a prescription for azithromycin but have asked her not to take it for 5-7 days.  I felt that it would be beneficial for her to have the azithromycin because of her immunosuppression issues.  If she worsens or is not improving she should follow-up with a primary care physician.  She may always return to our clinic if necessary 4. F/u prn if symptoms worsen or don't improve   Final Clinical Impressions(s) / UC Diagnoses   Final diagnoses:  Upper respiratory tract infection, unspecified type    ED Discharge Orders        Ordered    benzonatate (TESSALON) 200 MG capsule     07/18/17 1044    chlorpheniramine-HYDROcodone (TUSSIONEX PENNKINETIC ER) 10-8 MG/5ML SUER  2 times daily     07/18/17 1044    azithromycin (ZITHROMAX) 250 MG tablet  Daily     07/18/17 1044       Controlled Substance Prescriptions [place] Controlled Substance Registry consulted? Not Applicable   Lorin Picket, PA-C 07/18/17 1051

## 2017-07-18 NOTE — ED Triage Notes (Signed)
Patient states two days ago she started feeling more cold, she noticed a burning sensation in her throat. Patient is also experiencing fever, chest discomfort, cough and congestion.

## 2017-07-31 ENCOUNTER — Encounter: Payer: Self-pay | Admitting: Nurse Practitioner

## 2017-07-31 ENCOUNTER — Ambulatory Visit: Payer: Medicare Other | Admitting: Nurse Practitioner

## 2017-07-31 ENCOUNTER — Other Ambulatory Visit: Payer: Self-pay

## 2017-07-31 VITALS — BP 132/86 | Temp 98.1°F | Resp 16 | Ht 66.0 in | Wt 202.0 lb

## 2017-07-31 DIAGNOSIS — J209 Acute bronchitis, unspecified: Secondary | ICD-10-CM

## 2017-07-31 DIAGNOSIS — B370 Candidal stomatitis: Secondary | ICD-10-CM | POA: Diagnosis not present

## 2017-07-31 DIAGNOSIS — B379 Candidiasis, unspecified: Secondary | ICD-10-CM | POA: Diagnosis not present

## 2017-07-31 DIAGNOSIS — F17200 Nicotine dependence, unspecified, uncomplicated: Secondary | ICD-10-CM | POA: Diagnosis not present

## 2017-07-31 MED ORDER — NYSTATIN 100000 UNIT/ML MT SUSP: 500000.0000 [IU] | Freq: Four times a day (QID) | OROMUCOSAL | 0 refills | Status: DC

## 2017-07-31 MED ORDER — VARENICLINE TARTRATE 1 MG PO TABS: 1.0000 mg | ORAL_TABLET | Freq: Two times a day (BID) | ORAL | 3 refills | Status: DC

## 2017-07-31 MED ORDER — PREDNISONE 10 MG (21) PO TBPK: ORAL_TABLET | ORAL | 0 refills | Status: DC

## 2017-07-31 MED ORDER — AMOXICILLIN-POT CLAVULANATE 875-125 MG PO TABS
1.0000 | ORAL_TABLET | Freq: Two times a day (BID) | ORAL | 0 refills | Status: DC
Start: 2017-07-31 — End: 2018-02-26

## 2017-07-31 MED ORDER — VARENICLINE TARTRATE 0.5 MG X 11 & 1 MG X 42 PO MISC: ORAL | 0 refills | Status: DC

## 2017-07-31 MED ORDER — FLUCONAZOLE 150 MG PO TABS: ORAL_TABLET | ORAL | 0 refills | Status: DC

## 2017-07-31 NOTE — Progress Notes (Signed)
[facility] [place], [place] [address]  Internal MEDICINE  Office Visit Note  Patient Name: Shannon [name]  [id number]  [id number]  Date of Service: 08/23/2017  Chief Complaint  Patient presents with  . Other    thrush     The patient is here for sick visit. Was recently treated at [facility] for bronchitis/pneumonia. Was put on z-pack and tessalon perls as needed for cough. She forgot to ask for medication to treat thrush, as she frequently develops this while taking antibiotics.  Still coughing and having excess mucus production. Does feel better than when she was seen at Urgent Care.  She does mention that she would like to try Chantix so she can quit smoking altogether. She has tried wellbutrin in the past and it made her extremely irritable and depressed. She has been able to cut her smoking from 1 pack per day to 1 pack every 3 days. Wants to quit completely.   Pt is here for a sick visit.     Current Medication:  Outpatient Encounter Medications as of 07/31/2017  Medication Sig Note  . azaTHIOprine (IMURAN) 50 MG tablet Take 50 mg by mouth daily.   . calcium-vitamin D (OSCAL WITH D) 500-200 MG-UNIT per tablet Take 1 tablet by mouth.   . Cholecalciferol (VITAMIN D3) 10000 units TABS Take by mouth.   Marland Kitchen glucose blood test strip Use as instructed   . hydroxychloroquine (PLAQUENIL) 200 MG tablet TAKE 1 TABLET (200 MG TOTAL) BY MOUTH TWO (2) TIMES A DAY. 09/06/2015: Received from: External Pharmacy  . ibuprofen (ADVIL,MOTRIN) 800 MG tablet TAKE 1 TABLET (800 MG TOTAL) BY MOUTH DAILY.   Marland Kitchen lactulose (CHRONULAC) 10 GM/15ML solution TAKE 30 ML BY MOUTH 4 TIMES DAILY AS NEEDED   . Magnesium Oxide 500 MG TABS Take by mouth.   . metFORMIN (GLUCOPHAGE) 500 MG tablet TAKE 1 TABLET (500 MG TOTAL) BY MOUTH DAILY WITH BREAKFAST.   Marland Kitchen omeprazole (PRILOSEC) 20 MG capsule TAKE 1 CAPSULE (20 MG TOTAL) BY MOUTH DAILY.   Marland Kitchen predniSONE (DELTASONE) 5 MG tablet TAKE 1/2  TABLET ONCE DAILY   . PROAIR HFA 108 (90 BASE) MCG/ACT inhaler INHALE 2 PUFFS AS DIRECTED 60 SECONDS APART   . rifaximin (XIFAXAN) 550 MG TABS tablet Take 550 mg by mouth 2 (two) times daily.   . silver sulfADIAZINE (SILVADENE) 1 % cream Apply 1 application topically daily.   Marland Kitchen spironolactone (ALDACTONE) 50 MG tablet TAKE 1 TABLET BY MOUTH EVERY DAY (Patient taking differently: TAKE 1 TABLET BY MOUTH TWICE A WEEK)   . traMADol (ULTRAM) 50 MG tablet TAKE 1 TABLET BY MOUTH EVERY 8 HOURS AS NEEDED   . vitamin B-12 (CYANOCOBALAMIN) 100 MCG tablet Take 100 mcg by mouth daily.   Marland Kitchen amoxicillin-clavulanate (AUGMENTIN) 875-125 MG tablet Take 1 tablet by mouth 2 (two) times daily.   . Blood Glucose Monitoring Suppl (ONE TOUCH ULTRA SYSTEM KIT) W/DEVICE KIT Test blood sugar as directed Dx E09.9   . chlorpheniramine-HYDROcodone (TUSSIONEX PENNKINETIC ER) 10-8 MG/5ML SUER Take 5 mLs by mouth 2 (two) times daily. (Patient not taking: Reported on 07/31/2017)   . fluconazole (DIFLUCAN) 150 MG tablet Take 1 tablet po once. May repeat dose in 3 days as needed for persistent symptoms.   . lidocaine (LIDODERM) 5 % Place 1 patch onto the skin daily. Remove & Discard patch within 12 hours or as directed by MD   . nystatin (MYCOSTATIN) 100000 UNIT/ML suspension Take 5 mLs (500,000  Units total) by mouth 4 (four) times daily.   . predniSONE (STERAPRED UNI-PAK 21 TAB) 10 MG (21) TBPK tablet 6 day taper - take by mouth as directed for 6 days   . varenicline (CHANTIX CONTINUING MONTH PAK) 1 MG tablet Take 1 tablet (1 mg total) by mouth 2 (two) times daily.   . varenicline (CHANTIX STARTING MONTH PAK) 0.5 MG X 11 & 1 MG X 42 tablet Take by mouth as directed for smoking cessation.   . [DISCONTINUED] azithromycin (ZITHROMAX) 250 MG tablet Take 1 tablet (250 mg total) by mouth daily. Take first 2 tablets together, then 1 every day until finished.   . [DISCONTINUED] benzonatate (TESSALON) 200 MG capsule Take one cap TID PRN cough  (Patient not taking: Reported on 07/31/2017)   . [DISCONTINUED] gabapentin (NEURONTIN) 300 MG capsule Take 300 mg by mouth 3 (three) times daily as needed.    . [DISCONTINUED] nystatin (MYCOSTATIN) 100000 UNIT/ML suspension Take 5 mLs (500,000 Units total) by mouth 4 (four) times daily.    No facility-administered encounter medications on file as of 07/31/2017.       Medical History: Past Medical History:  Diagnosis Date  . Autoimmune hepatitis ([place])   . Diabetes mellitus without complication ([place])   . GERD (gastroesophageal reflux disease)   . Liver disease   . Osteoarthritis   . Systemic lupus erythematosus ([place]) 05/31/2010   Qualifier: Diagnosis of  By: Deborra Medina MD, Talia       Vital Signs: BP 132/86 (BP Location: Right Arm, Patient Position: Sitting, Cuff Size: Large)   Temp 98.1 F (36.7 C)   Resp 16   Ht 5' 6" (1.676 m)   Wt 202 lb (91.6 kg)   SpO2 99%   BMI 32.60 kg/m    Review of Systems  Constitutional: Positive for chills and fatigue. Negative for fever.  HENT: Positive for congestion, mouth sores, postnasal drip, rhinorrhea, sinus pressure, sinus pain and sore throat.   Eyes: Negative.   Respiratory: Positive for cough and wheezing.   Cardiovascular: Negative for chest pain and palpitations.  Gastrointestinal: Negative for constipation, diarrhea, nausea and vomiting.  Endocrine: Negative for cold intolerance, heat intolerance, polydipsia, polyphagia and polyuria.  Genitourinary: Negative.   Musculoskeletal: Negative for arthralgias, back pain and myalgias.  Skin: Negative for rash.  Allergic/Immunologic: Positive for environmental allergies.  Neurological: Positive for headaches.  Hematological: Positive for adenopathy.  Psychiatric/Behavioral: Negative for behavioral problems, dysphoric mood and self-injury. The patient is not nervous/anxious.     Physical Exam  Constitutional: She is oriented to person, place, and time. She appears well-developed and  well-nourished. No distress.  HENT:  Head: Normocephalic and atraumatic.  Right Ear: Tympanic membrane is erythematous and bulging.  Left Ear: Tympanic membrane is erythematous and bulging.  Nose: Rhinorrhea present. Right sinus exhibits maxillary sinus tenderness and frontal sinus tenderness. Left sinus exhibits maxillary sinus tenderness and frontal sinus tenderness.  Mouth/Throat: Oropharyngeal exudate and posterior oropharyngeal erythema present.  Eyes: Pupils are equal, round, and reactive to light. EOM are normal.  Neck: Normal range of motion. Neck supple. No JVD present. No tracheal deviation present. No thyromegaly present.  Cardiovascular: Normal rate, regular rhythm and normal heart sounds. Exam reveals no gallop and no friction rub.  No murmur heard. Pulmonary/Chest: Effort normal. No respiratory distress. She has wheezes. She has no rales. She exhibits no tenderness.  Congested, non-productive cough present.   Abdominal: Soft. Bowel sounds are normal. There is no tenderness.  Musculoskeletal: Normal range of  motion.  Lymphadenopathy:    She has cervical adenopathy.  Neurological: She is alert and oriented to person, place, and time. No cranial nerve deficit.  Skin: Skin is warm and dry. She is not diaphoretic.  Psychiatric: She has a normal mood and affect. Her behavior is normal. Judgment and thought content normal.  Nursing note and vitals reviewed.   Assessment/Plan: 1. Acute bronchitis, unspecified organism Treat with augmentin 823m twice daily for 10 days. Add prednisone dose pack as directed for 6 days.  - amoxicillin-clavulanate (AUGMENTIN) 875-125 MG tablet; Take 1 tablet by mouth 2 (two) times daily.  Dispense: 20 tablet; Refill: 0 - predniSONE (STERAPRED UNI-PAK 21 TAB) 10 MG (21) TBPK tablet; 6 day taper - take by mouth as directed for 6 days  Dispense: 21 tablet; Refill: 0  2. Oral candidiasis - nystatin (MYCOSTATIN) 100000 UNIT/ML suspension; Take 5 mLs  (500,000 Units total) by mouth 4 (four) times daily.  Dispense: 400 mL; Refill: 0  3. Candidiasis - fluconazole (DIFLUCAN) 150 MG tablet; Take 1 tablet po once. May repeat dose in 3 days as needed for persistent symptoms.  Dispense: 10 tablet; Refill: 0  4. Smoker - varenicline (CHANTIX STARTING MONTH PAK) 0.5 MG X 11 & 1 MG X 42 tablet; Take by mouth as directed for smoking cessation.  Dispense: 53 tablet; Refill: 0 - varenicline (CHANTIX CONTINUING MONTH PAK) 1 MG tablet; Take 1 tablet (1 mg total) by mouth 2 (two) times daily.  Dispense: 60 tablet; Refill: 3  General Counseling: Bobbyjo verbalizes understanding of the findings of todays visit and agrees with plan of treatment. I have discussed any further diagnostic evaluation that may be needed or ordered today. We also reviewed her medications today. she has been encouraged to call the office with any questions or concerns that should arise related to todays visit.   Rest and increase fluids. Continue using OTC medication to control symptoms.   This patient was seen by HLeretha Pol FNP- C in Collaboration with Dr FLavera Guiseas a part of collaborative care agreement  Meds ordered this encounter  Medications  . amoxicillin-clavulanate (AUGMENTIN) 875-125 MG tablet    Sig: Take 1 tablet by mouth 2 (two) times daily.    Dispense:  20 tablet    Refill:  0    Order Specific Question:   Supervising Provider    Answer:   KLavera Guise[[[id number]] . predniSONE (STERAPRED UNI-PAK 21 TAB) 10 MG (21) TBPK tablet    Sig: 6 day taper - take by mouth as directed for 6 days    Dispense:  21 tablet    Refill:  0    Order Specific Question:   Supervising Provider    Answer:   KLavera Guise[[place] . fluconazole (DIFLUCAN) 150 MG tablet    Sig: Take 1 tablet po once. May repeat dose in 3 days as needed for persistent symptoms.    Dispense:  10 tablet    Refill:  0    Order Specific Question:   Supervising Provider    Answer:   KLavera Guise [[[id number]] . nystatin (MYCOSTATIN) 100000 UNIT/ML suspension    Sig: Take 5 mLs (500,000 Units total) by mouth 4 (four) times daily.    Dispense:  400 mL    Refill:  0    Order Specific Question:   Supervising Provider    Answer:   KLavera Guise[[[id number]] . varenicline (CHANTIX STARTING MONTH PAK) 0.5  MG X 11 & 1 MG X 42 tablet    Sig: Take by mouth as directed for smoking cessation.    Dispense:  53 tablet    Refill:  0    Order Specific Question:   Supervising Provider    Answer:   Lavera Guise [[id number]]  . varenicline (CHANTIX CONTINUING MONTH PAK) 1 MG tablet    Sig: Take 1 tablet (1 mg total) by mouth 2 (two) times daily.    Dispense:  60 tablet    Refill:  3    Order Specific Question:   Supervising Provider    Answer:   Lavera Guise [[id number]]    Time spent: 20 Minutes

## 2017-08-10 DIAGNOSIS — Z79899 Other long term (current) drug therapy: Secondary | ICD-10-CM | POA: Diagnosis not present

## 2017-08-10 DIAGNOSIS — K754 Autoimmune hepatitis: Secondary | ICD-10-CM | POA: Diagnosis not present

## 2017-08-10 DIAGNOSIS — I422 Other hypertrophic cardiomyopathy: Secondary | ICD-10-CM | POA: Diagnosis not present

## 2017-08-10 DIAGNOSIS — M3219 Other organ or system involvement in systemic lupus erythematosus: Secondary | ICD-10-CM | POA: Diagnosis not present

## 2017-08-10 DIAGNOSIS — M545 Low back pain: Secondary | ICD-10-CM | POA: Diagnosis not present

## 2017-08-10 DIAGNOSIS — M461 Sacroiliitis, not elsewhere classified: Secondary | ICD-10-CM | POA: Diagnosis not present

## 2017-08-10 DIAGNOSIS — E559 Vitamin D deficiency, unspecified: Secondary | ICD-10-CM | POA: Diagnosis not present

## 2017-08-10 DIAGNOSIS — G8911 Acute pain due to trauma: Secondary | ICD-10-CM | POA: Diagnosis not present

## 2017-08-10 DIAGNOSIS — M25511 Pain in right shoulder: Secondary | ICD-10-CM | POA: Diagnosis not present

## 2017-08-10 DIAGNOSIS — M329 Systemic lupus erythematosus, unspecified: Secondary | ICD-10-CM | POA: Diagnosis not present

## 2017-08-23 DIAGNOSIS — J40 Bronchitis, not specified as acute or chronic: Secondary | ICD-10-CM | POA: Insufficient documentation

## 2017-08-23 DIAGNOSIS — J209 Acute bronchitis, unspecified: Secondary | ICD-10-CM | POA: Insufficient documentation

## 2017-09-02 DIAGNOSIS — R609 Edema, unspecified: Secondary | ICD-10-CM | POA: Diagnosis not present

## 2017-09-02 DIAGNOSIS — M329 Systemic lupus erythematosus, unspecified: Secondary | ICD-10-CM | POA: Diagnosis not present

## 2017-09-02 DIAGNOSIS — M1710 Unilateral primary osteoarthritis, unspecified knee: Secondary | ICD-10-CM | POA: Diagnosis not present

## 2017-09-02 DIAGNOSIS — Z7952 Long term (current) use of systemic steroids: Secondary | ICD-10-CM | POA: Diagnosis not present

## 2017-09-02 DIAGNOSIS — K7689 Other specified diseases of liver: Secondary | ICD-10-CM | POA: Diagnosis not present

## 2017-09-02 DIAGNOSIS — K8020 Calculus of gallbladder without cholecystitis without obstruction: Secondary | ICD-10-CM | POA: Diagnosis not present

## 2017-09-02 DIAGNOSIS — E119 Type 2 diabetes mellitus without complications: Secondary | ICD-10-CM | POA: Diagnosis not present

## 2017-09-02 DIAGNOSIS — K7290 Hepatic failure, unspecified without coma: Secondary | ICD-10-CM | POA: Diagnosis not present

## 2017-09-02 DIAGNOSIS — K838 Other specified diseases of biliary tract: Secondary | ICD-10-CM | POA: Diagnosis not present

## 2017-09-02 DIAGNOSIS — K7460 Unspecified cirrhosis of liver: Secondary | ICD-10-CM | POA: Diagnosis not present

## 2017-09-28 DIAGNOSIS — Z7952 Long term (current) use of systemic steroids: Secondary | ICD-10-CM | POA: Diagnosis not present

## 2017-09-28 DIAGNOSIS — K7460 Unspecified cirrhosis of liver: Secondary | ICD-10-CM | POA: Diagnosis not present

## 2017-09-28 DIAGNOSIS — M8588 Other specified disorders of bone density and structure, other site: Secondary | ICD-10-CM | POA: Diagnosis not present

## 2017-09-28 DIAGNOSIS — Z780 Asymptomatic menopausal state: Secondary | ICD-10-CM | POA: Diagnosis not present

## 2017-10-02 ENCOUNTER — Other Ambulatory Visit: Payer: Self-pay | Admitting: Nurse Practitioner

## 2017-10-02 MED ORDER — METFORMIN HCL 500 MG PO TABS: ORAL_TABLET | ORAL | 1 refills | Status: DC

## 2017-10-30 DIAGNOSIS — E119 Type 2 diabetes mellitus without complications: Secondary | ICD-10-CM | POA: Diagnosis not present

## 2017-10-30 DIAGNOSIS — K3189 Other diseases of stomach and duodenum: Secondary | ICD-10-CM | POA: Diagnosis not present

## 2017-10-30 DIAGNOSIS — J45909 Unspecified asthma, uncomplicated: Secondary | ICD-10-CM | POA: Diagnosis not present

## 2017-10-30 DIAGNOSIS — K7460 Unspecified cirrhosis of liver: Secondary | ICD-10-CM | POA: Diagnosis not present

## 2017-10-30 DIAGNOSIS — K219 Gastro-esophageal reflux disease without esophagitis: Secondary | ICD-10-CM | POA: Diagnosis not present

## 2017-10-30 DIAGNOSIS — K766 Portal hypertension: Secondary | ICD-10-CM | POA: Diagnosis not present

## 2017-11-02 ENCOUNTER — Ambulatory Visit: Payer: Self-pay | Admitting: Nurse Practitioner

## 2017-11-16 ENCOUNTER — Encounter: Payer: Self-pay | Admitting: Nurse Practitioner

## 2017-11-24 ENCOUNTER — Telehealth: Payer: Self-pay

## 2017-11-24 ENCOUNTER — Ambulatory Visit: Payer: Self-pay | Admitting: Adult Health

## 2017-11-24 NOTE — Telephone Encounter (Signed)
MAILED [facility] DISCHARGE LETTER FOR NON COMPLIANCE WITH PHYSICIAN ORDERS AND APPT STATUS. BETH

## 2018-01-01 LAB — HM DIABETES EYE EXAM

## 2018-01-04 ENCOUNTER — Encounter: Payer: Self-pay | Admitting: Internal Medicine

## 2018-01-06 ENCOUNTER — Telehealth: Payer: Self-pay | Admitting: Internal Medicine

## 2018-01-06 NOTE — Telephone Encounter (Signed)
NP upcoming  Saw [facility] 01/04/18 rec f/u in 2 years on meds needs eye exam   [place]

## 2018-01-15 DIAGNOSIS — M79641 Pain in right hand: Secondary | ICD-10-CM | POA: Diagnosis not present

## 2018-01-15 DIAGNOSIS — E559 Vitamin D deficiency, unspecified: Secondary | ICD-10-CM | POA: Diagnosis not present

## 2018-01-15 DIAGNOSIS — M545 Low back pain: Secondary | ICD-10-CM | POA: Diagnosis not present

## 2018-01-15 DIAGNOSIS — M79642 Pain in left hand: Secondary | ICD-10-CM | POA: Diagnosis not present

## 2018-01-15 DIAGNOSIS — G8929 Other chronic pain: Secondary | ICD-10-CM | POA: Diagnosis not present

## 2018-01-15 DIAGNOSIS — M329 Systemic lupus erythematosus, unspecified: Secondary | ICD-10-CM | POA: Diagnosis not present

## 2018-01-15 DIAGNOSIS — Z791 Long term (current) use of non-steroidal anti-inflammatories (NSAID): Secondary | ICD-10-CM | POA: Diagnosis not present

## 2018-01-15 DIAGNOSIS — M3219 Other organ or system involvement in systemic lupus erythematosus: Secondary | ICD-10-CM | POA: Diagnosis not present

## 2018-01-15 DIAGNOSIS — M797 Fibromyalgia: Secondary | ICD-10-CM | POA: Diagnosis not present

## 2018-01-15 DIAGNOSIS — M461 Sacroiliitis, not elsewhere classified: Secondary | ICD-10-CM | POA: Diagnosis not present

## 2018-02-22 DIAGNOSIS — M869 Osteomyelitis, unspecified: Secondary | ICD-10-CM | POA: Diagnosis not present

## 2018-02-22 DIAGNOSIS — M3219 Other organ or system involvement in systemic lupus erythematosus: Secondary | ICD-10-CM | POA: Diagnosis not present

## 2018-02-22 DIAGNOSIS — M25551 Pain in right hip: Secondary | ICD-10-CM | POA: Diagnosis not present

## 2018-02-22 DIAGNOSIS — M461 Sacroiliitis, not elsewhere classified: Secondary | ICD-10-CM | POA: Diagnosis not present

## 2018-02-22 DIAGNOSIS — M25462 Effusion, left knee: Secondary | ICD-10-CM | POA: Diagnosis not present

## 2018-02-22 DIAGNOSIS — M89351 Hypertrophy of bone, right femur: Secondary | ICD-10-CM | POA: Diagnosis not present

## 2018-02-22 DIAGNOSIS — M89352 Hypertrophy of bone, left femur: Secondary | ICD-10-CM | POA: Diagnosis not present

## 2018-02-22 DIAGNOSIS — M25561 Pain in right knee: Secondary | ICD-10-CM | POA: Diagnosis not present

## 2018-02-22 DIAGNOSIS — M25461 Effusion, right knee: Secondary | ICD-10-CM | POA: Diagnosis not present

## 2018-02-22 DIAGNOSIS — M329 Systemic lupus erythematosus, unspecified: Secondary | ICD-10-CM | POA: Diagnosis not present

## 2018-02-26 ENCOUNTER — Ambulatory Visit (INDEPENDENT_AMBULATORY_CARE_PROVIDER_SITE_OTHER): Payer: Medicare Other | Admitting: Internal Medicine

## 2018-02-26 ENCOUNTER — Encounter: Payer: Self-pay | Admitting: Internal Medicine

## 2018-02-26 VITALS — BP 120/72 | HR 76 | Temp 98.5°F | Ht 66.0 in | Wt 199.0 lb

## 2018-02-26 DIAGNOSIS — Z8709 Personal history of other diseases of the respiratory system: Secondary | ICD-10-CM

## 2018-02-26 DIAGNOSIS — Z1159 Encounter for screening for other viral diseases: Secondary | ICD-10-CM | POA: Diagnosis not present

## 2018-02-26 DIAGNOSIS — Z1329 Encounter for screening for other suspected endocrine disorder: Secondary | ICD-10-CM

## 2018-02-26 DIAGNOSIS — K754 Autoimmune hepatitis: Secondary | ICD-10-CM

## 2018-02-26 DIAGNOSIS — E559 Vitamin D deficiency, unspecified: Secondary | ICD-10-CM | POA: Diagnosis not present

## 2018-02-26 DIAGNOSIS — G8929 Other chronic pain: Secondary | ICD-10-CM

## 2018-02-26 DIAGNOSIS — E785 Hyperlipidemia, unspecified: Secondary | ICD-10-CM

## 2018-02-26 DIAGNOSIS — R609 Edema, unspecified: Secondary | ICD-10-CM | POA: Diagnosis not present

## 2018-02-26 DIAGNOSIS — K7460 Unspecified cirrhosis of liver: Secondary | ICD-10-CM

## 2018-02-26 DIAGNOSIS — K766 Portal hypertension: Secondary | ICD-10-CM

## 2018-02-26 DIAGNOSIS — E099 Drug or chemical induced diabetes mellitus without complications: Secondary | ICD-10-CM

## 2018-02-26 DIAGNOSIS — G894 Chronic pain syndrome: Secondary | ICD-10-CM | POA: Insufficient documentation

## 2018-02-26 DIAGNOSIS — Z23 Encounter for immunization: Secondary | ICD-10-CM | POA: Diagnosis not present

## 2018-02-26 DIAGNOSIS — F17200 Nicotine dependence, unspecified, uncomplicated: Secondary | ICD-10-CM

## 2018-02-26 DIAGNOSIS — E119 Type 2 diabetes mellitus without complications: Secondary | ICD-10-CM | POA: Diagnosis not present

## 2018-02-26 DIAGNOSIS — M1990 Unspecified osteoarthritis, unspecified site: Secondary | ICD-10-CM

## 2018-02-26 DIAGNOSIS — M533 Sacrococcygeal disorders, not elsewhere classified: Secondary | ICD-10-CM | POA: Insufficient documentation

## 2018-02-26 DIAGNOSIS — M329 Systemic lupus erythematosus, unspecified: Secondary | ICD-10-CM | POA: Diagnosis not present

## 2018-02-26 DIAGNOSIS — M5417 Radiculopathy, lumbosacral region: Secondary | ICD-10-CM

## 2018-02-26 DIAGNOSIS — Z860100 Personal history of colon polyps, unspecified: Secondary | ICD-10-CM

## 2018-02-26 DIAGNOSIS — Z0184 Encounter for antibody response examination: Secondary | ICD-10-CM

## 2018-02-26 DIAGNOSIS — M3219 Other organ or system involvement in systemic lupus erythematosus: Secondary | ICD-10-CM

## 2018-02-26 DIAGNOSIS — M25551 Pain in right hip: Secondary | ICD-10-CM | POA: Diagnosis not present

## 2018-02-26 DIAGNOSIS — T380X5A Adverse effect of glucocorticoids and synthetic analogues, initial encounter: Secondary | ICD-10-CM

## 2018-02-26 DIAGNOSIS — K3189 Other diseases of stomach and duodenum: Secondary | ICD-10-CM

## 2018-02-26 HISTORY — DX: Personal history of other diseases of the respiratory system: Z87.09

## 2018-02-26 NOTE — Patient Instructions (Addendum)
D3 5000 IU daily   rec pap smear and mammogram    Xray right knee  FINDINGS:  No acute fractures. Alignment is normal. Redemonstrated mild joint space narrowing tricompartmentally. Mild tibial spine spurring, unchanged. Soft tissues are preserved. Small joint effusion.  IMPRESSION: No acute osseous abnormalities of the right knee.  Sacroiliac Joint Dysfunction Sacroiliac joint dysfunction is a condition that causes inflammation on one or both sides of the sacroiliac (SI) joint. The SI joint connects the lower part of the spine (sacrum) with the two upper portions of the pelvis (ilium). This condition causes deep aching or burning pain in the low back. In some cases, the pain may also spread into one or both buttocks or hips or spread down the legs. What are the causes? This condition may be caused by:  Pregnancy. During pregnancy, extra stress is put on the SI joints because the pelvis widens.  Injury, such as: ? Car accidents. ? Sport-related injuries. ? Work-related injuries.  Having one leg that is shorter than the other.  Conditions that affect the joints, such as: ? Rheumatoid arthritis. ? Gout. ? Psoriatic arthritis. ? Joint infection (septic arthritis).  Sometimes, the cause of SI joint dysfunction is not known. What are the signs or symptoms? Symptoms of this condition include:  Aching or burning pain in the lower back. The pain may also spread to other areas, such as: ? Buttocks. ? Groin. ? Thighs and legs.  Muscle spasms in or around the painful areas.  Increased pain when standing, walking, running, stair climbing, bending, or lifting.  How is this diagnosed? Your health care provider will do a physical exam and take your medical history. During the exam, the health care provider may move one or both of your legs to different positions to check for pain. Various tests may be done to help verify the diagnosis, including:  Imaging tests to look for other  causes of pain. These may include: ? MRI. ? CT scan. ? Bone scan.  Diagnostic injection. A numbing medicine is injected into the SI joint using a needle. If the pain is temporarily improved or stopped after the injection, this can indicate that SI joint dysfunction is the problem.  How is this treated? Treatment may vary depending on the cause and severity of your condition. Treatment options may include:  Applying ice or heat to the lower back area. This can help to reduce pain and muscle spasms.  Medicines to relieve pain or inflammation or to relax the muscles.  Wearing a back brace (sacroiliac brace) to help support the joint while your back is healing.  Physical therapy to increase muscle strength around the joint and flexibility at the joint. This may also involve learning proper body positions and ways of moving to relieve stress on the joint.  Direct manipulation of the SI joint.  Injections of steroid medicine into the joint in order to reduce pain and swelling.  Radiofrequency ablation to burn away nerves that are carrying pain messages from the joint.  Use of a device that provides electrical stimulation in order to reduce pain at the joint.  Surgery to put in screws and plates that limit or prevent joint motion. This is rare.  Follow these instructions at home:  Rest as needed. Limit your activities as directed by your health care provider.  Take medicines only as directed by your health care provider.  If directed, apply ice to the affected area: ? Put ice in a plastic bag. ?  Place a towel between your skin and the bag. ? Leave the ice on for 20 minutes, 2-3 times per day.  Use a heating pad or a moist heat pack as directed by your health care provider.  Exercise as directed by your health care provider or physical therapist.  Keep all follow-up visits as directed by your health care provider. This is important. Contact a health care provider if:  Your pain  is not controlled with medicine.  You have a fever.  You have increasingly severe pain. Get help right away if:  You have weakness, numbness, or tingling in your legs or feet.  You lose control of your bladder or bowel. This information is not intended to replace advice given to you by your health care provider. Make sure you discuss any questions you have with your health care provider. Document Released: 07/25/2008 Document Revised: 10/04/2015 Document Reviewed: 01/03/2014 Elsevier Interactive Patient Education  2018 [employer].  Systemic Lupus Erythematosus, Adult Systemic lupus erythematosus is a long-term (chronic) disease that can affect many parts of the body. It can damage the skin, joints, blood vessels, brain, kidneys, lungs, heart, and other internal organs. It causes pain, irritation, and inflammation. Systemic lupus erythematosus is an autoimmune disease. With this type of disease, the body's defense system (immune system) mistakenly attacks normal tissues instead of attacking germs or abnormal growths. What are the causes? The cause of this condition is not known. What increases the risk? This condition is more likely to develop in:  Females.  People of Asian descent.  People of African-American descent.  People who have a family history of the condition.  What are the signs or symptoms? General symptoms include:  Joint pain and swelling (common).  Fever.  Fatigue.  Unusual weight loss or weight gain.  Skin rashes, especially over the nose and cheeks (butterfly rash) and after sun exposure.  Sores inside the mouth or nose.  Other symptoms depend on which parts of the body are affected. They can include:  Shortness of breath.  Chest pain.  Frequent urination.  Blood in the urine.  Seizures.  Mental changes.  Hair loss.  Swollen and tender lymph nodes.  Swelling of the hands or feet.  Symptoms can come and go. A period of time when  symptoms get worse or come back is called a flare. A period of time with no symptoms is called a remission. How is this diagnosed? This condition is diagnosed based on symptoms, a medical history, and a physical exam. You may also have tests, including:  Blood tests.  Urine tests.  A chest X-ray.  A skin or kidney biopsy. For this test, a sample of tissue is taken from the skin or kidney and studied under a microscope.  You may be referred to an autoimmune disease specialist (rheumatologist). How is this treated? There is no cure for this condition, but treatment can keep the disease in remission, help to control symptoms, and prevent damage to the heart, lungs, kidneys, and other organs. Treatment may involve taking a combination of medicines over time. Follow these instructions at home: Medicines  Take medicines only as directed by your health care provider.  Do not take any medicines that contain estrogen without first checking with your health care provider. Estrogen can trigger flares and may increase your risk for blood clots. Lifestyle  Eat a heart-healthy diet.  Stay active as directed by your health care provider.  Do not smoke. If you need help quitting, ask  your health care provider.  Protect your skin from the sun by applying sunblock and wearing protective hats and clothing.  Learn as much as you can about your condition and have a good support system in place. Support may come from family, friends, or a lupus support group. General instructions  Keep all follow-up visits as directed by your health care provider. This is important.  Work closely with all of your health care providers to manage your condition.  Let your health care provider know right away if you become pregnant or if you plan to become pregnant. Pregnancy in women with this condition is considered high risk. Contact a health care provider if:  You have a fever.  Your symptoms flare.  You  develop new symptoms.  You develop swollen feet or hands.  You develop puffiness around your eyes.  Your medicines are not working.  You have bloody, foamy, or coffee-colored urine.  There are changes in your urination. For example, you urinate more often at night.  You think that you may be depressed or have anxiety. Get help right away if:  You have chest pain.  You have trouble breathing.  You have a seizure.  You suddenly get a very bad headache.  You suddenly develop facial or body weakness.  You cannot speak.  You cannot understand speech. This information is not intended to replace advice given to you by your health care provider. Make sure you discuss any questions you have with your health care provider. Document Released: 04/18/2002 Document Revised: 12/23/2015 Document Reviewed: 04/05/2014 Elsevier Interactive Patient Education  [employer].

## 2018-03-01 ENCOUNTER — Other Ambulatory Visit (INDEPENDENT_AMBULATORY_CARE_PROVIDER_SITE_OTHER): Payer: Medicare Other

## 2018-03-01 DIAGNOSIS — Z1159 Encounter for screening for other viral diseases: Secondary | ICD-10-CM | POA: Diagnosis not present

## 2018-03-01 DIAGNOSIS — E559 Vitamin D deficiency, unspecified: Secondary | ICD-10-CM

## 2018-03-01 DIAGNOSIS — Z1329 Encounter for screening for other suspected endocrine disorder: Secondary | ICD-10-CM

## 2018-03-01 DIAGNOSIS — Z0184 Encounter for antibody response examination: Secondary | ICD-10-CM

## 2018-03-01 DIAGNOSIS — K7460 Unspecified cirrhosis of liver: Secondary | ICD-10-CM | POA: Diagnosis not present

## 2018-03-01 DIAGNOSIS — E119 Type 2 diabetes mellitus without complications: Secondary | ICD-10-CM | POA: Diagnosis not present

## 2018-03-02 ENCOUNTER — Other Ambulatory Visit: Payer: Self-pay | Admitting: Internal Medicine

## 2018-03-02 DIAGNOSIS — K7460 Unspecified cirrhosis of liver: Secondary | ICD-10-CM

## 2018-03-03 ENCOUNTER — Encounter: Payer: Self-pay | Admitting: Internal Medicine

## 2018-03-03 DIAGNOSIS — Z86010 Personal history of colonic polyps: Secondary | ICD-10-CM | POA: Insufficient documentation

## 2018-03-03 NOTE — Progress Notes (Signed)
Chief Complaint  Patient presents with  . Establish Care    est care   1. DM2 A1C was 6.5 09/06/16. She has been off insulin x 7-8 years and thinks DM was steroid induced since 2000 and HLD. She is not on statin. She is on metformin 500 mg qd  She had eye exam and urine is utd   2. Liver cirrhosis since at least 03/1999 with mild portal HTN with chronic hepatic encephalopathy/coma, with h/o autoimmune hepatitis since early 2000s per pt she periodically has EGDs with [facility]. She is on lactulose though not taking 30 mg qid as they Rx she is taking 50-60 g 1x per day with spironolactone 50 mg qd   3. SLE (dsDNA +01/15/18) x ~2 years per pt, with chronic sacroilitis/chronic pain. F/u [facility] considering cosentyx she is currently on Imuran 100 mg bid and plaquenil 200 mg bid and prednisone 2.5 mg qd.  She has also been Rx Tramadol 100 mg qd prn as 50 mg qd prn did not help with pain She also has lidocaine patches   MRI 02/26/18  No MRI evidence for avascular necrosis.  Degenerative fraying of the superior right acetabular labrum.  Mild bilateral hip chondrosis.  Edema of the L5 pedicles, can be seen in stress reaction. Bilateral sacroiliitis, unchanged from prior MRI.  4. Chronic pain with sacroilitis and arthritis back and right shoulder with h/o calcium deposit per pt, right knee and left knee h/o torn ligament -with Xray low back 03/28/15 multilevel DDD and facet arthrosis L5-S1 worse   Review of Systems  Constitutional: Negative for weight loss.  HENT: Negative for hearing loss.   Eyes: Negative for blurred vision.  Respiratory: Negative for shortness of breath.   Cardiovascular: Negative for chest pain.  Gastrointestinal: Negative for abdominal pain.  Musculoskeletal: Positive for joint pain.  Skin: Negative for rash.  Neurological: Negative for headaches.       Per pt chronic hepatic encephalopathy    Psychiatric/Behavioral: Positive for depression.       Mood varies and  depressed at times due to multiple health issues     Past Medical History:  Diagnosis Date  . Autoimmune hepatitis ([place])   . Diabetes mellitus without complication ([place])   . GERD (gastroesophageal reflux disease)   . Liver disease   . Osteoarthritis   . Systemic lupus erythematosus ([place]) 05/31/2010   Qualifier: Diagnosis of  By: Deborra Medina MD, Talia     Past Surgical History:  Procedure Laterality Date  . ABDOMINAL HYSTERECTOMY    . BACK SURGERY    . LAPAROSCOPIC HYSTERECTOMY    . TUBAL LIGATION     Family History  Problem Relation Age of Onset  . Heart disease Father   . Hypertension Father   . Hypertension Sister    Social History   Socioeconomic History  . Marital status: Legally Separated    Spouse name: Not on file  . Number of children: Not on file  . Years of education: Not on file  . Highest education level: Not on file  Occupational History  . Not on file  Social Needs  . Financial resource strain: Not on file  . Food insecurity:    Worry: Not on file    Inability: Not on file  . Transportation needs:    Medical: Not on file    Non-medical: Not on file  Tobacco Use  . Smoking status: Current Every Day Smoker    Packs/day: 0.25  Years: 20.00    Pack years: 5.00    Types: Cigarettes  . Smokeless tobacco: Never Used  Substance and Sexual Activity  . Alcohol use: No  . Drug use: No  . Sexual activity: Not on file  Lifestyle  . Physical activity:    Days per week: Not on file    Minutes per session: Not on file  . Stress: Not on file  Relationships  . Social connections:    Talks on phone: Not on file    Gets together: Not on file    Attends religious service: Not on file    Active member of club or organization: Not on file    Attends meetings of clubs or organizations: Not on file    Relationship status: Not on file  . Intimate partner violence:    Fear of current or ex partner: Not on file    Emotionally abused: Not on file    Physically  abused: Not on file    Forced sexual activity: Not on file  Other Topics Concern  . Not on file  Social History Narrative  . Not on file   Current Meds  Medication Sig  . azaTHIOprine (IMURAN) 50 MG tablet Take 100 mg by mouth 2 (two) times daily.   . calcium-vitamin D (OSCAL WITH D) 500-200 MG-UNIT per tablet Take 1 tablet by mouth.  . Cholecalciferol (VITAMIN D3) 5000 units TABS Take by mouth.   . diclofenac sodium (VOLTAREN) 1 % GEL Apply topically 4 (four) times daily.  . hydroxychloroquine (PLAQUENIL) 200 MG tablet Take 200 mg by mouth 2 (two) times daily.   Marland Kitchen ibuprofen (ADVIL,MOTRIN) 800 MG tablet TAKE 1 TABLET (800 MG TOTAL) BY MOUTH DAILY.  Marland Kitchen lactulose (CHRONULAC) 10 GM/15ML solution TAKE 30 ML BY MOUTH 4 TIMES DAILY AS NEEDED (Patient taking differently: Take 30 g by mouth. 50-60 g 1x per day not 30 g qid)  . lidocaine (LIDODERM) 5 % Place 1 patch onto the skin daily. Remove & Discard patch within 12 hours or as directed by MD  . metFORMIN (GLUCOPHAGE) 500 MG tablet TAKE 1 TABLET (500 MG TOTAL) BY MOUTH DAILY WITH BREAKFAST.  Marland Kitchen nystatin (MYCOSTATIN) 100000 UNIT/ML suspension Take 5 mLs (500,000 Units total) by mouth 4 (four) times daily.  Marland Kitchen omeprazole (PRILOSEC) 20 MG capsule TAKE 1 CAPSULE (20 MG TOTAL) BY MOUTH DAILY.  Marland Kitchen predniSONE (DELTASONE) 5 MG tablet TAKE 1/2 TABLET ONCE DAILY (Patient taking differently: Take 5 mg by mouth daily with breakfast. )  . PROAIR HFA 108 (90 BASE) MCG/ACT inhaler INHALE 2 PUFFS AS DIRECTED 60 SECONDS APART  . spironolactone (ALDACTONE) 50 MG tablet TAKE 1 TABLET BY MOUTH EVERY DAY (Patient taking differently: Take 50 mg by mouth. Biweekly)  . traMADol (ULTRAM) 50 MG tablet TAKE 1 TABLET BY MOUTH EVERY 8 HOURS AS NEEDED (Patient taking differently: Take 100 mg by mouth daily as needed. )   Allergies  Allergen Reactions  . Hydrocodone Other (See Comments)    Sweating, lethargy  . Oxycodone Other (See Comments)    Sweating, lethargy   Recent  Results (from the past 2160 hour(s))  HM DIABETES EYE EXAM     Status: None   Collection Time: 01/01/18 12:00 AM  Result Value Ref Range   HM Diabetic Eye Exam No Retinopathy No Retinopathy    Comment: [facility]   Hemoglobin A1c     Status: Abnormal   Collection Time: 03/01/18  3:34 PM  Result Value Ref Range  Hgb A1c MFr Bld 6.1 (H) <5.7 % of total Hgb    Comment: For someone without known diabetes, a hemoglobin  A1c value between 5.7% and 6.4% is consistent with prediabetes and should be confirmed with a  follow-up test. . For someone with known diabetes, a value <7% indicates that their diabetes is well controlled. A1c targets should be individualized based on duration of diabetes, age, comorbid conditions, and other considerations. . This assay result is consistent with an increased risk of diabetes. . Currently, no consensus exists regarding use of hemoglobin A1c for diagnosis of diabetes for children. .    Mean Plasma Glucose 128 (calc)   eAG (mmol/L) 7.1 (calc)  Lipid panel     Status: Abnormal   Collection Time: 03/01/18  3:34 PM  Result Value Ref Range   Cholesterol 224 (H) <200 mg/dL   HDL 56 >50 mg/dL   Triglycerides 72 <150 mg/dL   LDL Cholesterol (Calc) 151 (H) mg/dL (calc)    Comment: Reference range: <100 . Desirable range <100 mg/dL for primary prevention;   <70 mg/dL for patients with CHD or diabetic patients  with > or = 2 CHD risk factors. Marland Kitchen LDL-C is now calculated using the Martin-Hopkins  calculation, which is a validated novel method providing  better accuracy than the Friedewald equation in the  estimation of LDL-C.  Cresenciano Genre et al. Annamaria Helling. 4742;595(63): 2061-2068  (http://education.[facility].com/faq/FAQ164)    Total CHOL/HDL Ratio 4.0 <5.0 (calc)   Non-HDL Cholesterol (Calc) 168 (H) <130 mg/dL (calc)    Comment: For patients with diabetes plus 1 major ASCVD risk  factor, treating to a non-HDL-C goal of <100 mg/dL  (LDL-C of <70  mg/dL) is considered a therapeutic  option.   TSH     Status: None   Collection Time: 03/01/18  3:34 PM  Result Value Ref Range   TSH 1.16 0.40 - 4.50 mIU/L  T4, free     Status: None   Collection Time: 03/01/18  3:34 PM  Result Value Ref Range   Free T4 1.0 0.8 - 1.8 ng/dL  Vitamin D (25 hydroxy)     Status: None   Collection Time: 03/01/18  3:34 PM  Result Value Ref Range   Vit D, 25-Hydroxy 38 30 - 100 ng/mL    Comment: Vitamin D Status         25-OH Vitamin D: . Deficiency:                    <20 ng/mL Insufficiency:             20 - 29 ng/mL Optimal:                 > or = 30 ng/mL . For 25-OH Vitamin D testing on patients on  D2-supplementation and patients for whom quantitation  of D2 and D3 fractions is required, the QuestAssureD(TM) 25-OH VIT D, (D2,D3), LC/MS/MS is recommended: order  code [id number] (patients >84yrs). . For more information on this test, go to: http://education.[facility].com/faq/FAQ163 (This link is being provided for  informational/educational purposes only.)   Magnesium     Status: Abnormal   Collection Time: 03/01/18  3:34 PM  Result Value Ref Range   Magnesium 1.3 (L) 1.5 - 2.5 mg/dL  Hepatitis B surface antibody,quantitative     Status: None   Collection Time: 03/01/18  3:34 PM  Result Value Ref Range   Hepatitis B-Post 102 > OR = 10 mIU/mL    Comment: .  Patient has immunity to hepatitis B virus. . For additional information, please refer to http://education.[facility].com/faq/FAQ105 (This link is being provided for informational/ educational purposes only).   TEST AUTHORIZATION     Status: None   Collection Time: 03/01/18  3:34 PM  Result Value Ref Range   TEST NAME: HEPATITIS A AB, TOTAL    TEST CODE: [id number]    CLIENT CONTACT: LATOYA WRIGHT    REPORT ALWAYS MESSAGE SIGNATURE      Comment: . The laboratory testing on this patient was verbally requested or confirmed by the ordering physician or his or her  authorized representative after contact with an employee of [employer]. Federal regulations require that we maintain on file written authorization for all laboratory testing.  Accordingly we are asking that the ordering physician or his or her authorized representative sign a copy of this report and promptly return it to the client service representative. . . Signature:____________________________________________________ . Please fax this signed page to [phone] or return it via your [employer] courier.    Objective  Body mass index is 32.12 kg/m. Wt Readings from Last 3 Encounters:  02/26/18 199 lb (90.3 kg)  07/31/17 202 lb (91.6 kg)  07/18/17 192 lb (87.1 kg)   Temp Readings from Last 3 Encounters:  02/26/18 98.5 F (36.9 C) (Oral)  07/31/17 98.1 F (36.7 C)  07/18/17 98.4 F (36.9 C) (Oral)   BP Readings from Last 3 Encounters:  02/26/18 120/72  07/31/17 132/86  07/18/17 128/75   Pulse Readings from Last 3 Encounters:  02/26/18 76  07/18/17 81  09/06/15 80    Physical Exam  Constitutional: She is oriented to person, place, and time. Vital signs are normal. She appears well-developed and well-nourished. She is cooperative.  HENT:  Head: Normocephalic and atraumatic.  Mouth/Throat: Oropharynx is clear and moist and mucous membranes are normal.  Eyes: Pupils are equal, round, and reactive to light. Conjunctivae are normal.  Cardiovascular: Normal rate, regular rhythm and normal heart sounds.  Pulmonary/Chest: Effort normal and breath sounds normal.  Neurological: She is alert and oriented to person, place, and time. Gait normal.  Skin: Skin is warm, dry and intact.  Psychiatric: She has a normal mood and affect. Her speech is normal and behavior is normal. Judgment and thought content normal. Cognition and memory are normal.  Nursing note and vitals reviewed.   Assessment   1. DM2 A1C was 6.5 09/06/16. She has been off insulin x 7-8 years and  thinks DM was steroid induced since 2000 and HLD  2. Liver cirrhosis since at least 03/1999 with mild portal HTN with chronic hepatic encephalopathy/coma, with h/o autoimmune hepatitis since early 2000s per pt   3. SLE (dsDNA +01/15/18) x ~2 years per pt, with chronic sacroilitis MRI 02/26/18  No MRI evidence for avascular necrosis.  Degenerative fraying of the superior right acetabular labrum.  Mild bilateral hip chondrosis.  Edema of the L5 pedicles, can be seen in stress reaction. Bilateral sacroiliitis, unchanged from prior MRI.  4. Chronic pain with sacroilitis and arthritis back and right shoulder with h/o calcium deposit per pt, right knee and left knee h/o torn ligament -with Xray low back 03/28/15 multilevel DDD and facet arthrosis L5-S1 worse   5. HM Plan   1. On metformin 500 mg qd  Last eye exam 12/2017 [facility] neg retinopathy need to get records  Urine protein neg 01/15/18  Do foot exam at f/u  2.  F/u [facility] US liver sch 03/11/18  Check hep B sAb quant and hep A total  Will ask if ok for statin due to HLD, DM2  On lactulose 50-60 g 1x per daily and spironolactone 50 mg qd   3 and 4. Cont meds f/u [facility]ology Considering Cosentyx for sacroilitis noted chronically on imaging and causing pain  Also established with Dr. Jetty Peeks ortho  on Imuran 100 mg bid and plaquenil 200 mg bid and prednisone 2.5 mg qd.  She has also been Rx Tramadol 100 mg qd prn as 50 mg qd prn did not help with pain She also has lidocaine patches   5. Check fasting labs had some labs 01/15/18 care everywhere CMET, CBC, UA, urine protein. sch A1C lipid, vit D, TSH, T4, hep Band A labs   Given high dose flu shot today Consider shingrix in future  prevnar had 04/24/14, consider pna 23 in future  Tdap had 11/04/10   Pap had 03/31/07 cant exclude HSIL s/p hysterectomy for DUB in 2008 given history rec pt to think about it  Mammogram pt declines today will see if she will consider in  future  DEXA 10/05/15 normal, 10/06/17 lumbar and femur neck osteopenia Vitamin D 22.8 07/13/17-rec D3 5000 IU qd   Colonoscopy 07/08/16 diverticulosis, polpys x 3 hyperplastic, IH [facility] q 10 years  EGD 10/30/17 mild portal hypertensive gastropathy in gastric body  HCV neg 03/19/10, hep B sAg neg 03/19/10  Consider HIV check in future will disc with pt   Smoking x 40 years 1 pk lasts 2 days max 1 ppd FH mom lung was smoker -rec cessation    "I spent >45 minutes face-to face with patient with greater than 50% of time spent counseling and/or in coordination of care and chart review update chart appropriately for this complicated patient   Provider: Dr. Olivia Mackie McLean-Scocuzza-Internal Medicine

## 2018-03-04 LAB — HEPATITIS B SURFACE ANTIBODY, QUANTITATIVE: Hepatitis B-Post: 102 m[IU]/mL (ref >=10)

## 2018-03-04 LAB — HEMOGLOBIN A1C
Hgb A1c MFr Bld: 6.1 % of total Hgb — ABNORMAL HIGH (ref <5.7)
Mean Plasma Glucose: 128 (calc)
eAG (mmol/L): 7.1 (calc)

## 2018-03-04 LAB — TEST AUTHORIZATION

## 2018-03-04 LAB — MAGNESIUM: Magnesium: 1.3 mg/dL — ABNORMAL LOW (ref 1.5–2.5)

## 2018-03-04 LAB — TSH: TSH: 1.16 mIU/L (ref 0.40–4.50)

## 2018-03-04 LAB — LIPID PANEL
Cholesterol: 224 mg/dL — ABNORMAL HIGH (ref <200)
HDL: 56 mg/dL (ref >50)
LDL Cholesterol (Calc): 151 mg/dL (calc) — ABNORMAL HIGH
Non-HDL Cholesterol (Calc): 168 mg/dL (calc) — ABNORMAL HIGH (ref <130)
Total CHOL/HDL Ratio: 4 (calc) (ref <5.0)
Triglycerides: 72 mg/dL (ref <150)

## 2018-03-04 LAB — VITAMIN D 25 HYDROXY (VIT D DEFICIENCY, FRACTURES): Vit D, 25-Hydroxy: 38 ng/mL (ref 30–100)

## 2018-03-04 LAB — T4, FREE: Free T4: 1 ng/dL (ref 0.8–1.8)

## 2018-03-04 LAB — HEPATITIS A ANTIBODY, TOTAL: Hepatitis A AB,Total: REACTIVE — AB

## 2018-03-08 DIAGNOSIS — M461 Sacroiliitis, not elsewhere classified: Secondary | ICD-10-CM | POA: Diagnosis not present

## 2018-03-10 DIAGNOSIS — K7290 Hepatic failure, unspecified without coma: Secondary | ICD-10-CM | POA: Diagnosis not present

## 2018-03-10 DIAGNOSIS — Z79899 Other long term (current) drug therapy: Secondary | ICD-10-CM | POA: Diagnosis not present

## 2018-03-10 DIAGNOSIS — Z7952 Long term (current) use of systemic steroids: Secondary | ICD-10-CM | POA: Diagnosis not present

## 2018-03-10 DIAGNOSIS — M4690 Unspecified inflammatory spondylopathy, site unspecified: Secondary | ICD-10-CM | POA: Insufficient documentation

## 2018-03-10 DIAGNOSIS — Z7984 Long term (current) use of oral hypoglycemic drugs: Secondary | ICD-10-CM | POA: Diagnosis not present

## 2018-03-10 DIAGNOSIS — M8580 Other specified disorders of bone density and structure, unspecified site: Secondary | ICD-10-CM | POA: Diagnosis not present

## 2018-03-10 DIAGNOSIS — K8020 Calculus of gallbladder without cholecystitis without obstruction: Secondary | ICD-10-CM | POA: Diagnosis not present

## 2018-03-10 DIAGNOSIS — M329 Systemic lupus erythematosus, unspecified: Secondary | ICD-10-CM | POA: Diagnosis not present

## 2018-03-10 DIAGNOSIS — K754 Autoimmune hepatitis: Secondary | ICD-10-CM | POA: Diagnosis not present

## 2018-03-10 DIAGNOSIS — K7469 Other cirrhosis of liver: Secondary | ICD-10-CM | POA: Diagnosis not present

## 2018-03-10 DIAGNOSIS — K7460 Unspecified cirrhosis of liver: Secondary | ICD-10-CM | POA: Diagnosis not present

## 2018-03-10 DIAGNOSIS — Z885 Allergy status to narcotic agent status: Secondary | ICD-10-CM | POA: Diagnosis not present

## 2018-04-07 ENCOUNTER — Encounter: Payer: Self-pay | Admitting: Internal Medicine

## 2018-04-07 ENCOUNTER — Ambulatory Visit: Payer: Medicare Other | Admitting: Internal Medicine

## 2018-04-07 VITALS — BP 134/80 | HR 85 | Temp 98.3°F | Ht 66.0 in | Wt 196.8 lb

## 2018-04-07 DIAGNOSIS — J4520 Mild intermittent asthma, uncomplicated: Secondary | ICD-10-CM | POA: Diagnosis not present

## 2018-04-07 DIAGNOSIS — E785 Hyperlipidemia, unspecified: Secondary | ICD-10-CM

## 2018-04-07 DIAGNOSIS — J40 Bronchitis, not specified as acute or chronic: Secondary | ICD-10-CM

## 2018-04-07 DIAGNOSIS — Z720 Tobacco use: Secondary | ICD-10-CM | POA: Diagnosis not present

## 2018-04-07 DIAGNOSIS — J4521 Mild intermittent asthma with (acute) exacerbation: Secondary | ICD-10-CM | POA: Insufficient documentation

## 2018-04-07 DIAGNOSIS — G8929 Other chronic pain: Secondary | ICD-10-CM | POA: Diagnosis not present

## 2018-04-07 MED ORDER — IPRATROPIUM BROMIDE 0.02 % IN SOLN
0.5000 mg | Freq: Once | RESPIRATORY_TRACT | Status: AC
  Administered 2018-04-07: 0.5 mg via RESPIRATORY_TRACT

## 2018-04-07 MED ORDER — ALBUTEROL SULFATE (2.5 MG/3ML) 0.083% IN NEBU
2.5000 mg | INHALATION_SOLUTION | Freq: Once | RESPIRATORY_TRACT | Status: AC
  Administered 2018-04-07: 2.5 mg via RESPIRATORY_TRACT

## 2018-04-07 MED ORDER — ALBUTEROL SULFATE HFA 108 (90 BASE) MCG/ACT IN AERS: 1.0000 | INHALATION_SPRAY | Freq: Four times a day (QID) | RESPIRATORY_TRACT | 12 refills | Status: DC | PRN

## 2018-04-07 MED ORDER — METHYLPREDNISOLONE ACETATE 40 MG/ML IJ SUSP
40.0000 mg | Freq: Once | INTRAMUSCULAR | Status: AC
  Administered 2018-04-07: 40 mg via INTRAMUSCULAR

## 2018-04-07 MED ORDER — AMOXICILLIN-POT CLAVULANATE 875-125 MG PO TABS: 1.0000 | ORAL_TABLET | Freq: Two times a day (BID) | ORAL | 0 refills | Status: DC

## 2018-04-07 MED ORDER — CLOTRIMAZOLE 10 MG MT TROC: 10.0000 mg | Freq: Every day | OROMUCOSAL | 0 refills | Status: DC

## 2018-04-07 NOTE — Patient Instructions (Addendum)
Consider MRI right shoulder if pain continues tell [facility]    Bronchospasm, Adult Bronchospasm is a tightening of the airways going into the lungs. During an episode, it may be harder to breathe. You may cough, and you may make a whistling sound when you breathe (wheeze). This condition often affects people with asthma. What are the causes? This condition is caused by swelling and irritation in the airways. It can be triggered by:  An infection (common).  Seasonal allergies.  An allergic reaction.  Exercise.  Irritants. These include pollution, cigarette smoke, strong odors, aerosol sprays, and paint fumes.  Weather changes. Winds increase molds and pollens in the air. Cold air may cause swelling.  Stress and emotional upset.  What are the signs or symptoms? Symptoms of this condition include:  Wheezing. If the episode was triggered by an allergy, wheezing may start right away or hours later.  Nighttime coughing.  Frequent or severe coughing with a simple cold.  Chest tightness.  Shortness of breath.  Decreased ability to exercise.  How is this diagnosed? This condition is usually diagnosed with a review of your medical history and a physical exam. Tests, such as lung function tests, are sometimes done to look for other conditions. The need for a chest X-ray depends on where the wheezing occurs and whether it is the first time you have wheezed. How is this treated? This condition may be treated with:  Inhaled medicines. These open up the airways and help you breathe. They can be taken with an inhaler or a nebulizer device.  Corticosteroid medicines. These may be given for severe bronchospasm, usually when it is associated with asthma.  Avoiding triggers, such as irritants, infection, or allergies.  Follow these instructions at home: Medicines  Take over-the-counter and prescription medicines only as told by your health care provider.  If you need to use an  inhaler or nebulizer to take your medicine, ask your health care provider to explain how to use it correctly. If you were given a spacer, always use it with your inhaler. Lifestyle  Reduce the number of triggers in your home. To do this: ? Change your heating and air conditioning filter at least once a month. ? Limit your use of fireplaces and wood stoves. ? Do not smoke. Do not allow smoking in your home. ? Avoid using perfumes and fragrances. ? Get rid of pests, such as roaches and mice, and their droppings. ? Remove any mold from your home. ? Keep your house clean and dust free. Use unscented cleaning products. ? Replace carpet with wood, tile, or vinyl flooring. Carpet can trap dander and dust. ? Use allergy-proof pillows, mattress covers, and box spring covers. ? Wash bed sheets and blankets every week in hot water. Dry them in a dryer. ? Use blankets that are made of polyester or cotton. ? Wash your hands often. ? Do not allow pets in your bedroom.  Avoid breathing in cold air when you exercise. General instructions  Have a plan for seeking medical care. Know when to call your health care provider and local emergency services, and where to get emergency care.  Stay up to date on your immunizations.  When you have an episode of bronchospasm, stay calm. Try to relax and breathe more slowly.  If you have asthma, make sure you have an asthma action plan.  Keep all follow-up visits as told by your health care provider. This is important. Contact a health care provider if:  You  have muscle aches.  You have chest pain.  The mucus that you cough up (sputum) changes from clear or white to yellow, green, gray, or bloody.  You have a fever.  Your sputum gets thicker. Get help right away if:  Your wheezing and coughing get worse, even after you take your prescribed medicines.  It gets even harder to breathe.  You develop severe chest pain. Summary  Bronchospasm is a  tightening of the airways going into the lungs.  During an episode of bronchospasm, you may have a harder time breathing. You may cough and make a whistling sound when you breathe (wheeze).  Avoid exposure to triggers such as smoke, dust, mold, animal dander, and fragrances.  When you have an episode of bronchospasm, stay calm. Try to relax and breathe more slowly. This information is not intended to replace advice given to you by your health care provider. Make sure you discuss any questions you have with your health care provider. Document Released: 05/01/2003 Document Revised: 04/24/2016 Document Reviewed: 04/24/2016 Elsevier Interactive Patient Education  2017 [employer]  Acute Bronchitis, Adult Acute bronchitis is sudden (acute) swelling of the air tubes (bronchi) in the lungs. Acute bronchitis causes these tubes to fill with mucus, which can make it hard to breathe. It can also cause coughing or wheezing. In adults, acute bronchitis usually goes away within 2 weeks. A cough caused by bronchitis may last up to 3 weeks. Smoking, allergies, and asthma can make the condition worse. Repeated episodes of bronchitis may cause further lung problems, such as chronic obstructive pulmonary disease (COPD). What are the causes? This condition can be caused by germs and by substances that irritate the lungs, including:  Cold and flu viruses. This condition is most often caused by the same virus that causes a cold.  Bacteria.  Exposure to tobacco smoke, dust, fumes, and air pollution.  What increases the risk? This condition is more likely to develop in people who:  Have close contact with someone with acute bronchitis.  Are exposed to lung irritants, such as tobacco smoke, dust, fumes, and vapors.  Have a weak immune system.  Have a respiratory condition such as asthma.  What are the signs or symptoms? Symptoms of this condition include:  A cough.  Coughing up clear, yellow, or  green mucus.  Wheezing.  Chest congestion.  Shortness of breath.  A fever.  Body aches.  Chills.  A sore throat.  How is this diagnosed? This condition is usually diagnosed with a physical exam. During the exam, your health care provider may order tests, such as chest X-rays, to rule out other conditions. He or she may also:  Test a sample of your mucus for bacterial infection.  Check the level of oxygen in your blood. This is done to check for pneumonia.  Do a chest X-ray or lung function testing to rule out pneumonia and other conditions.  Perform blood tests.  Your health care provider will also ask about your symptoms and medical history. How is this treated? Most cases of acute bronchitis clear up over time without treatment. Your health care provider may recommend:  Drinking more fluids. Drinking more makes your mucus thinner, which may make it easier to breathe.  Taking a medicine for a fever or cough.  Taking an antibiotic medicine.  Using an inhaler to help improve shortness of breath and to control a cough.  Using a cool mist vaporizer or humidifier to make it easier to breathe.  Follow  these instructions at home: Medicines  Take over-the-counter and prescription medicines only as told by your health care provider.  If you were prescribed an antibiotic, take it as told by your health care provider. Do not stop taking the antibiotic even if you start to feel better. General instructions  Get plenty of rest.  Drink enough fluids to keep your urine clear or pale yellow.  Avoid smoking and secondhand smoke. Exposure to cigarette smoke or irritating chemicals will make bronchitis worse. If you smoke and you need help quitting, ask your health care provider. Quitting smoking will help your lungs heal faster.  Use an inhaler, cool mist vaporizer, or humidifier as told by your health care provider.  Keep all follow-up visits as told by your health care  provider. This is important. How is this prevented? To lower your risk of getting this condition again:  Wash your hands often with soap and water. If soap and water are not available, use hand sanitizer.  Avoid contact with people who have cold symptoms.  Try not to touch your hands to your mouth, nose, or eyes.  Make sure to get the flu shot every year.  Contact a health care provider if:  Your symptoms do not improve in 2 weeks of treatment. Get help right away if:  You cough up blood.  You have chest pain.  You have severe shortness of breath.  You become dehydrated.  You faint or keep feeling like you are going to faint.  You keep vomiting.  You have a severe headache.  Your fever or chills gets worse. This information is not intended to replace advice given to you by your health care provider. Make sure you discuss any questions you have with your health care provider. Document Released: 06/05/2004 Document Revised: 11/21/2015 Document Reviewed: 10/17/2015 Elsevier Interactive Patient Education  2018 [place] is a term that is commonly used to refer to joint pain or joint disease. There are more than 100 types of arthritis. What are the causes? The most common cause of this condition is wear and tear of a joint. Other causes include:  Gout.  Inflammation of a joint.  An infection of a joint.  Sprains and other injuries near the joint.  A drug reaction or allergic reaction.  In some cases, the cause may not be known. What are the signs or symptoms? The main symptom of this condition is pain in the joint with movement. Other symptoms include:  Redness, swelling, or stiffness at a joint.  Warmth coming from the joint.  Fever.  Overall feeling of illness.  How is this diagnosed? This condition may be diagnosed with a physical exam and tests, including:  Blood tests.  Urine tests.  Imaging tests, such as MRI, X-rays, or  a CT scan.  Sometimes, fluid is removed from a joint for testing. How is this treated? Treatment for this condition may involve:  Treatment of the cause, if it is known.  Rest.  Raising (elevating) the joint.  Applying cold or hot packs to the joint.  Medicines to improve symptoms and reduce inflammation.  Injections of a steroid such as cortisone into the joint to help reduce pain and inflammation.  Depending on the cause of your arthritis, you may need to make lifestyle changes to reduce stress on your joint. These changes may include exercising more and losing weight. Follow these instructions at home: Medicines  Take over-the-counter and prescription medicines only as told by your  health care provider.  Do not take aspirin to relieve pain if gout is suspected. Activity  Rest your joint if told by your health care provider. Rest is important when your disease is active and your joint feels painful, swollen, or stiff.  Avoid activities that make the pain worse. It is important to balance activity with rest.  Exercise your joint regularly with range-of-motion exercises as told by your health care provider. Try doing low-impact exercise, such as: ? Swimming. ? Water aerobics. ? Biking. ? Walking. Joint Care   If your joint is swollen, keep it elevated if told by your health care provider.  If your joint feels stiff in the morning, try taking a warm shower.  If directed, apply heat to the joint. If you have diabetes, do not apply heat without permission from your health care provider. ? Put a towel between the joint and the hot pack or heating pad. ? Leave the heat on the area for 20-30 minutes.  If directed, apply ice to the joint: ? Put ice in a plastic bag. ? Place a towel between your skin and the bag. ? Leave the ice on for 20 minutes, 2-3 times per day.  Keep all follow-up visits as told by your health care provider. This is important. Contact a health care  provider if:  The pain gets worse.  You have a fever. Get help right away if:  You develop severe joint pain, swelling, or redness.  Many joints become painful and swollen.  You develop severe back pain.  You develop severe weakness in your leg.  You cannot control your bladder or bowels. This information is not intended to replace advice given to you by your health care provider. Make sure you discuss any questions you have with your health care provider. Document Released: 06/05/2004 Document Revised: 10/04/2015 Document Reviewed: 07/24/2014 Elsevier Interactive Patient Education  [employer].

## 2018-04-07 NOTE — Progress Notes (Signed)
Pre visit review using our clinic review tool, if applicable. No additional management support is needed unless otherwise documented below in the visit note. 

## 2018-04-07 NOTE — Progress Notes (Addendum)
Chief Complaint  Patient presents with  . Follow-up   F/u 1. Sick c/o productive cough, nasal right drainage white grandaughter sick recently tried zinc and vitamin C otc but also c/o wheezing x 8 days. Out of albuterol inhaler. She is smoking cigarettes as well.  2. HLD will check with GI to see if she can tolerate statin with liver history  3. Chronic pain low back and knee pain was  9/10 now 4/10 s/p back injection. She also has right shoulder pain Xray 03/09/17 w moderate OA calcified tuberosity disc with pt consider MRI in future with [facility] if continues   Review of Systems  Constitutional: Negative for fever and weight loss.  HENT:       +runny nose    Eyes: Negative for blurred vision.  Respiratory: Positive for cough, sputum production and wheezing.   Cardiovascular: Negative for chest pain.  Gastrointestinal: Negative for heartburn.  Musculoskeletal: Positive for back pain and joint pain.  Skin: Negative for rash.  Neurological: Negative for headaches.  Psychiatric/Behavioral: Positive for depression.   Past Medical History:  Diagnosis Date  . Asthma   . Autoimmune hepatitis ([place])   . Depression   . Diabetes mellitus without complication ([place])   . DUB (dysfunctional uterine bleeding)    2008 s/p hysterectomy  . Exposure to TB    tx'ed x 6 months  . Gallstones   . Gastric ulcer   . GERD (gastroesophageal reflux disease)   . History of chicken pox   . Hx of migraines   . Liver disease   . Osteoarthritis   . Systemic lupus erythematosus ([place]) 05/31/2010   Qualifier: Diagnosis of  By: Deborra Medina MD, Tanja Port    . Vitamin D deficiency    Past Surgical History:  Procedure Laterality Date  . ABDOMINAL HYSTERECTOMY    . BACK SURGERY     L4/5 herniated disc repair 1992   . LAPAROSCOPIC HYSTERECTOMY    . TUBAL LIGATION     Family History  Problem Relation Age of Onset  . Cancer Mother        lung cancer smoker  . Heart disease Father   . Hypertension Father   .  Hypertension Sister   . Sarcoidosis Sister        lung transplant failure  . Heart disease Brother   . Alcohol abuse Brother   . Diabetes Sister   . Diabetes Sister   . Kidney disease Sister   . Hypertension Sister   . Rheum arthritis Sister    Social History   Socioeconomic History  . Marital status: Legally Separated    Spouse name: Not on file  . Number of children: Not on file  . Years of education: Not on file  . Highest education level: Not on file  Occupational History  . Not on file  Social Needs  . Financial resource strain: Not on file  . Food insecurity:    Worry: Not on file    Inability: Not on file  . Transportation needs:    Medical: Not on file    Non-medical: Not on file  Tobacco Use  . Smoking status: Current Every Day Smoker    Packs/day: 0.25    Years: 20.00    Pack years: 5.00    Types: Cigarettes  . Smokeless tobacco: Never Used  Substance and Sexual Activity  . Alcohol use: No  . Drug use: No  . Sexual activity: Not Currently  Lifestyle  . Physical activity:  Days per week: Not on file    Minutes per session: Not on file  . Stress: Not on file  Relationships  . Social connections:    Talks on phone: Not on file    Gets together: Not on file    Attends religious service: Not on file    Active member of club or organization: Not on file    Attends meetings of clubs or organizations: Not on file    Relationship status: Not on file  . Intimate partner violence:    Fear of current or ex partner: Not on file    Emotionally abused: Not on file    Physically abused: Not on file    Forced sexual activity: Not on file  Other Topics Concern  . Not on file  Social History Narrative   4 pregnancies, 3 kids    Smoker    Technical sales engineer ed former LPN   Current Meds  Medication Sig  . albuterol (PROAIR HFA) 108 (90 Base) MCG/ACT inhaler Inhale 1-2 puffs into the lungs every 6 (six) hours as needed for wheezing or shortness of breath.   . azaTHIOprine (IMURAN) 50 MG tablet Take 100 mg by mouth 2 (two) times daily.   . Blood Glucose Monitoring Suppl (ONE TOUCH ULTRA SYSTEM KIT) W/DEVICE KIT Test blood sugar as directed Dx E09.9  . calcium-vitamin D (OSCAL WITH D) 500-200 MG-UNIT per tablet Take 1 tablet by mouth.  . Cholecalciferol (VITAMIN D3) 5000 units TABS Take by mouth.   . diclofenac sodium (VOLTAREN) 1 % GEL Apply topically 4 (four) times daily.  Marland Kitchen glucose blood test strip Use as instructed  . hydroxychloroquine (PLAQUENIL) 200 MG tablet Take 200 mg by mouth 2 (two) times daily.   Marland Kitchen ibuprofen (ADVIL,MOTRIN) 800 MG tablet TAKE 1 TABLET (800 MG TOTAL) BY MOUTH DAILY.  Marland Kitchen lactulose (CHRONULAC) 10 GM/15ML solution TAKE 30 ML BY MOUTH 4 TIMES DAILY AS NEEDED (Patient taking differently: Take 30 g by mouth. 50-60 g 1x per day not 30 g qid)  . lidocaine (LIDODERM) 5 % Place 1 patch onto the skin daily. Remove & Discard patch within 12 hours or as directed by MD  . Magnesium Oxide 500 MG TABS Take by mouth.  . metFORMIN (GLUCOPHAGE) 500 MG tablet TAKE 1 TABLET (500 MG TOTAL) BY MOUTH DAILY WITH BREAKFAST.  Marland Kitchen nystatin (MYCOSTATIN) 100000 UNIT/ML suspension Take 5 mLs (500,000 Units total) by mouth 4 (four) times daily.  Marland Kitchen omeprazole (PRILOSEC) 20 MG capsule TAKE 1 CAPSULE (20 MG TOTAL) BY MOUTH DAILY.  Marland Kitchen predniSONE (DELTASONE) 5 MG tablet TAKE 1/2 TABLET ONCE DAILY (Patient taking differently: Take 5 mg by mouth daily with breakfast. )  . spironolactone (ALDACTONE) 50 MG tablet TAKE 1 TABLET BY MOUTH EVERY DAY (Patient taking differently: Take 50 mg by mouth. Biweekly)  . traMADol (ULTRAM) 50 MG tablet TAKE 1 TABLET BY MOUTH EVERY 8 HOURS AS NEEDED (Patient taking differently: Take 100 mg by mouth daily as needed. )  . vitamin B-12 (CYANOCOBALAMIN) 100 MCG tablet Take 100 mcg by mouth daily.  . [DISCONTINUED] PROAIR HFA 108 (90 BASE) MCG/ACT inhaler INHALE 2 PUFFS AS DIRECTED 60 SECONDS APART  . [DISCONTINUED] silver sulfADIAZINE  (SILVADENE) 1 % cream Apply 1 application topically daily.   Allergies  Allergen Reactions  . Hydrocodone Other (See Comments)    Sweating, lethargy, dizzy  . Oxycodone Other (See Comments)    Sweating, lethargy   Recent Results (from the past 2160  hour(s))  Hemoglobin A1c     Status: Abnormal   Collection Time: 03/01/18  3:34 PM  Result Value Ref Range   Hgb A1c MFr Bld 6.1 (H) <5.7 % of total Hgb    Comment: For someone without known diabetes, a hemoglobin  A1c value between 5.7% and 6.4% is consistent with prediabetes and should be confirmed with a  follow-up test. . For someone with known diabetes, a value <7% indicates that their diabetes is well controlled. A1c targets should be individualized based on duration of diabetes, age, comorbid conditions, and other considerations. . This assay result is consistent with an increased risk of diabetes. . Currently, no consensus exists regarding use of hemoglobin A1c for diagnosis of diabetes for children. .    Mean Plasma Glucose 128 (calc)   eAG (mmol/L) 7.1 (calc)  Lipid panel     Status: Abnormal   Collection Time: 03/01/18  3:34 PM  Result Value Ref Range   Cholesterol 224 (H) <200 mg/dL   HDL 56 >50 mg/dL   Triglycerides 72 <150 mg/dL   LDL Cholesterol (Calc) 151 (H) mg/dL (calc)    Comment: Reference range: <100 . Desirable range <100 mg/dL for primary prevention;   <70 mg/dL for patients with CHD or diabetic patients  with > or = 2 CHD risk factors. Marland Kitchen LDL-C is now calculated using the Martin-Hopkins  calculation, which is a validated novel method providing  better accuracy than the Friedewald equation in the  estimation of LDL-C.  Cresenciano Genre et al. Annamaria Helling. 6962;952(84): 2061-2068  (http://education.[facility].com/faq/FAQ164)    Total CHOL/HDL Ratio 4.0 <5.0 (calc)   Non-HDL Cholesterol (Calc) 168 (H) <130 mg/dL (calc)    Comment: For patients with diabetes plus 1 major ASCVD risk  factor, treating to a  non-HDL-C goal of <100 mg/dL  (LDL-C of <70 mg/dL) is considered a therapeutic  option.   TSH     Status: None   Collection Time: 03/01/18  3:34 PM  Result Value Ref Range   TSH 1.16 0.40 - 4.50 mIU/L  T4, free     Status: None   Collection Time: 03/01/18  3:34 PM  Result Value Ref Range   Free T4 1.0 0.8 - 1.8 ng/dL  Vitamin D (25 hydroxy)     Status: None   Collection Time: 03/01/18  3:34 PM  Result Value Ref Range   Vit D, 25-Hydroxy 38 30 - 100 ng/mL    Comment: Vitamin D Status         25-OH Vitamin D: . Deficiency:                    <20 ng/mL Insufficiency:             20 - 29 ng/mL Optimal:                 > or = 30 ng/mL . For 25-OH Vitamin D testing on patients on  D2-supplementation and patients for whom quantitation  of D2 and D3 fractions is required, the [facility]ureD(TM) 25-OH VIT D, (D2,D3), LC/MS/MS is recommended: order  code [phone] (patients >88yr). . For more information on this test, go to: http://education.[facility].com/faq/FAQ163 (This link is being provided for  informational/educational purposes only.)   Magnesium     Status: Abnormal   Collection Time: 03/01/18  3:34 PM  Result Value Ref Range   Magnesium 1.3 (L) 1.5 - 2.5 mg/dL  Hepatitis B surface antibody,quantitative     Status: None   Collection  Time: 03/01/18  3:34 PM  Result Value Ref Range   Hepatitis B-Post 102 > OR = 10 mIU/mL    Comment: . Patient has immunity to hepatitis B virus. . For additional information, please refer to http://education.[facility].com/faq/FAQ105 (This link is being provided for informational/ educational purposes only).   Hepatitis A antibody, total     Status: Abnormal   Collection Time: 03/01/18  3:34 PM  Result Value Ref Range   Hepatitis A AB,Total REACTIVE (A) NON-REACTI    Comment: For additional information, please refer to  http://education.[facility].com/faq/FAQ202  (This link is being provided for informational/ educational  purposes only.)   TEST AUTHORIZATION     Status: None   Collection Time: 03/01/18  3:34 PM  Result Value Ref Range   TEST NAME: HEPATITIS A AB, TOTAL    TEST CODE: [id number]    CLIENT CONTACT: LATOYA WRIGHT    REPORT ALWAYS MESSAGE SIGNATURE      Comment: . The laboratory testing on this patient was verbally requested or confirmed by the ordering physician or his or her authorized representative after contact with an employee of [employer]. Federal regulations require that we maintain on file written authorization for all laboratory testing.  Accordingly we are asking that the ordering physician or his or her authorized representative sign a copy of this report and promptly return it to the client service representative. . . Signature:____________________________________________________ . Please fax this signed page to [phone] or return it via your [employer] courier.    Objective  Body mass index is 31.76 kg/m. Wt Readings from Last 3 Encounters:  04/07/18 196 lb 12.8 oz (89.3 kg)  02/26/18 199 lb (90.3 kg)  07/31/17 202 lb (91.6 kg)   Temp Readings from Last 3 Encounters:  04/07/18 98.3 F (36.8 C) (Oral)  02/26/18 98.5 F (36.9 C) (Oral)  07/31/17 98.1 F (36.7 C)   BP Readings from Last 3 Encounters:  04/07/18 134/80  02/26/18 120/72  07/31/17 132/86   Pulse Readings from Last 3 Encounters:  04/07/18 85  02/26/18 76  07/18/17 81    Physical Exam  Constitutional: She is oriented to person, place, and time. Vital signs are normal. She appears well-developed and well-nourished. She is cooperative.  HENT:  Head: Normocephalic and atraumatic.  Mouth/Throat: Oropharynx is clear and moist and mucous membranes are normal.  Cardiovascular: Normal rate, regular rhythm and normal heart sounds.  Pulmonary/Chest: Effort normal. She has wheezes.  Neurological: She is alert and oriented to person, place, and time. Gait normal.  Skin: Skin is warm,  dry and intact.  Psychiatric: She has a normal mood and affect. Her speech is normal and behavior is normal. Judgment and thought content normal. Cognition and memory are normal.  Nursing note and vitals reviewed.   Assessment   1. Likely asthma exacerbation with bronchitis due to sick contact and tobacco abuse  2. HLD  3. Chronic pain back, knee and right shoulder  4. HM Plan   1. depomedrol 40 x 1, duoneb x 1  Prn albuterol inhaler, augmentin with clotrimazole lozengers  rec smoking cessation  Given cold handout disc claritin 10 mg qod prn given liver impairment 2. Ask GI about statin 3. F/u [facility] and rheum  Consider MRI right shoulder  4.  Flu shot utd  Consider shingrix in future  prevnar had 04/24/14, consider pna 23 in future  Tdap had 11/04/10  Hep A/B immune   Pap had 03/31/07 cant exclude HSIL s/p hysterectomy for DUB  in 2008 given history rec pt to think about it  -she is agreeable will do at f/u 4 months  Mammogram pt declines today again  DEXA 10/05/15 normal, 10/06/17 lumbar and femur neck osteopenia Vitamin D 22.8 07/13/17-rec D3 5000 IU qd   Colonoscopy 07/08/16 diverticulosis, polpys x 3 hyperplastic, IH [facility] q 10 years  EGD 10/30/17 mild portal hypertensive gastropathy in gastric body  HCV neg 03/19/10, hep B sAg neg 03/19/10  Consider HIV check in future will disc with pt   Smoking x 40 years 1 pk lasts 2 days max 1 ppd FH mom lung was smoker -rec cessation   Provider: Dr. Olivia Mackie McLean-Scocuzza-Internal Medicine

## 2018-04-14 ENCOUNTER — Other Ambulatory Visit: Payer: Self-pay | Admitting: Internal Medicine

## 2018-04-14 DIAGNOSIS — J40 Bronchitis, not specified as acute or chronic: Secondary | ICD-10-CM

## 2018-04-14 DIAGNOSIS — J4521 Mild intermittent asthma with (acute) exacerbation: Secondary | ICD-10-CM

## 2018-04-14 DIAGNOSIS — Z720 Tobacco use: Secondary | ICD-10-CM

## 2018-04-14 NOTE — Telephone Encounter (Signed)
Patient returned call and asked about why she's requesting the Z-Pack and Mycelex Troche. She says that she is on her last day of Augmentin and it has not cleared her up. She says she gets this problem about twice a year and usually Z-pack is prescribed and it works. She says she was just wanting to have that sent in and the Mycelex to prevent yeast in the mouth. I advised I will send over to Dr. Terese Door and someone from the office will call with her recommendation, patient verbalized understanding.

## 2018-04-14 NOTE — Telephone Encounter (Signed)
Called pt and left message to ask about symptoms for the medication request.

## 2018-04-14 NOTE — Telephone Encounter (Unsigned)
Copied from [place] [phone]. Topic: Quick Communication - Rx Refill/Question >> Apr 14, 2018 11:07 AM Judyann Munson wrote: Medication: Z-pack, clotrimazole (MYCELEX) 10 MG troche  Has the patient contacted their pharmacy? No   Preferred Pharmacy (with phone number or street name): [id number] - [place], [address] [place] [address] Phone: [phone] Fax: [phone] Not a 24 hour pharmacy; exact hours not known.    Agent: Please be advised that RX refills may take up to 3 business days. We ask that you follow-up with your pharmacy.

## 2018-04-15 ENCOUNTER — Other Ambulatory Visit: Payer: Self-pay | Admitting: Internal Medicine

## 2018-04-15 DIAGNOSIS — J4521 Mild intermittent asthma with (acute) exacerbation: Secondary | ICD-10-CM

## 2018-04-15 DIAGNOSIS — Z720 Tobacco use: Secondary | ICD-10-CM

## 2018-04-15 DIAGNOSIS — J40 Bronchitis, not specified as acute or chronic: Secondary | ICD-10-CM

## 2018-04-15 MED ORDER — CLOTRIMAZOLE 10 MG MT TROC: 10.0000 mg | Freq: Every day | OROMUCOSAL | 0 refills | Status: DC

## 2018-04-15 MED ORDER — AZITHROMYCIN 250 MG PO TABS: ORAL_TABLET | ORAL | 0 refills | Status: DC

## 2018-04-28 ENCOUNTER — Other Ambulatory Visit: Payer: Self-pay | Admitting: Internal Medicine

## 2018-04-28 ENCOUNTER — Telehealth: Payer: Self-pay | Admitting: Internal Medicine

## 2018-04-28 DIAGNOSIS — E785 Hyperlipidemia, unspecified: Secondary | ICD-10-CM

## 2018-04-28 MED ORDER — ATORVASTATIN CALCIUM 10 MG PO TABS: 10.0000 mg | ORAL_TABLET | Freq: Every day | ORAL | 3 refills | Status: DC

## 2018-04-28 NOTE — Telephone Encounter (Signed)
Heard from GI please start low dose lipitor 10 mg at night  sch lab visit in 1 month they are ok with this as long as we monitor liver enzymes closely  Schedule lab visit in 1 month   [place]

## 2018-04-29 NOTE — Telephone Encounter (Signed)
Left message for patient to return call back. P[facility] may give and obtain information.  

## 2018-04-29 NOTE — Telephone Encounter (Signed)
Patient returned call, advised of note below by Dr. Terese Door on 04/28/18, patient verbalized understanding. She says she will be having her lab work done at [facility] around the same time, so it will be done there.

## 2018-04-29 NOTE — Telephone Encounter (Signed)
Patient called, left VM to return call to the office for message from Dr. Terese Door.

## 2018-05-19 ENCOUNTER — Other Ambulatory Visit: Payer: Self-pay | Admitting: Internal Medicine

## 2018-05-19 DIAGNOSIS — E119 Type 2 diabetes mellitus without complications: Secondary | ICD-10-CM

## 2018-05-19 MED ORDER — METFORMIN HCL 500 MG PO TABS: ORAL_TABLET | ORAL | 3 refills | Status: DC

## 2018-07-30 DIAGNOSIS — K7469 Other cirrhosis of liver: Secondary | ICD-10-CM | POA: Diagnosis not present

## 2018-07-30 DIAGNOSIS — K754 Autoimmune hepatitis: Secondary | ICD-10-CM | POA: Diagnosis not present

## 2018-07-30 DIAGNOSIS — Z79899 Other long term (current) drug therapy: Secondary | ICD-10-CM | POA: Diagnosis not present

## 2018-07-30 DIAGNOSIS — M3211 Endocarditis in systemic lupus erythematosus: Secondary | ICD-10-CM | POA: Diagnosis not present

## 2018-08-06 ENCOUNTER — Ambulatory Visit: Payer: Medicare Other | Admitting: Internal Medicine

## 2018-08-31 ENCOUNTER — Ambulatory Visit (INDEPENDENT_AMBULATORY_CARE_PROVIDER_SITE_OTHER): Payer: Medicare Other

## 2018-08-31 ENCOUNTER — Other Ambulatory Visit: Payer: Self-pay

## 2018-08-31 DIAGNOSIS — Z0000 Encounter for general adult medical examination without abnormal findings: Secondary | ICD-10-CM

## 2018-08-31 NOTE — Patient Instructions (Addendum)
  Shannon [name] , Thank you for taking time to come for your Medicare Wellness Visit. I appreciate your ongoing commitment to your health goals. Please review the following plan we discussed and let me know if I can assist you in the future.   These are the goals we discussed: Goals      Patient Stated   . Client plans to start chantix and stop smoking (pt-stated)       This is a list of the screening recommended for you and due dates:  Health Maintenance  Topic Date Due  . Pneumococcal vaccine  04/14/1960  . Complete foot exam   04/14/1968  . HIV Screening  04/14/1973  . Pap Smear  04/15/1979  . Mammogram  04/14/2008  . Hemoglobin A1C  08/31/2018  . Flu Shot  12/11/2018  . Eye exam for diabetics  01/02/2019  . Urine Protein Check  01/16/2019  . Tetanus Vaccine  11/03/2020  . Colon Cancer Screening  07/08/2026  .  Hepatitis C: One time screening is recommended by Center for Disease Control  (CDC) for  adults born from [birth year] through 1963.   Completed

## 2018-08-31 NOTE — Progress Notes (Signed)
Subjective:   Shannon [name] is a 63 y.o. female who presents for an Initial Medicare Annual Wellness Visit.  Review of Systems    No ROS.  Medicare Wellness Visit. Additional risk factors are reflected in the social history.  Cardiac Risk Factors include: advanced age (>30mn, >>54women);hypertension;smoking/ tobacco exposure     Objective:    Today's Vitals   There is no height or weight on file to calculate BMI.  UTA vitals, virtual visit.  Advanced Directives 08/31/2018 07/18/2017  Does Patient Have a Medical Advance Directive? No No  Would patient like information on creating a medical advance directive? No - Patient declined -    Current Medications (verified) Outpatient Encounter Medications as of 08/31/2018  Medication Sig  . albuterol (PROAIR HFA) 108 (90 Base) MCG/ACT inhaler Inhale 1-2 puffs into the lungs every 6 (six) hours as needed for wheezing or shortness of breath.  . azaTHIOprine (IMURAN) 50 MG tablet Take 100 mg by mouth 2 (two) times daily.   . Blood Glucose Monitoring Suppl (ONE TOUCH ULTRA SYSTEM KIT) W/DEVICE KIT Test blood sugar as directed Dx E09.9  . calcium-vitamin D (OSCAL WITH D) 500-200 MG-UNIT per tablet Take 1 tablet by mouth.  . Cholecalciferol (VITAMIN D3) 5000 units TABS Take by mouth.   . clotrimazole (MYCELEX) 10 MG troche Take 1 tablet (10 mg total) by mouth 5 (five) times daily.  . diclofenac sodium (VOLTAREN) 1 % GEL Apply topically 4 (four) times daily.  .Marland Kitchenglucose blood test strip Use as instructed  . hydroxychloroquine (PLAQUENIL) 200 MG tablet Take 200 mg by mouth 2 (two) times daily.   .Marland Kitchenibuprofen (ADVIL,MOTRIN) 800 MG tablet TAKE 1 TABLET (800 MG TOTAL) BY MOUTH DAILY.  .Marland Kitchenlactulose (CHRONULAC) 10 GM/15ML solution TAKE 30 ML BY MOUTH 4 TIMES DAILY AS NEEDED (Patient taking differently: Take 30 g by mouth. 50-60 g 1x per day not 30 g qid)  . lidocaine (LIDODERM) 5 % Place 1 patch onto the skin daily. Remove & Discard patch within 12  hours or as directed by MD  . loratadine (CLARITIN) 10 MG tablet Take 10 mg by mouth every other day as needed for allergies.  . Magnesium Oxide 500 MG TABS Take by mouth.  . metFORMIN (GLUCOPHAGE) 500 MG tablet TAKE 1 TABLET (500 MG TOTAL) BY MOUTH DAILY WITH BREAKFAST.  .Marland Kitchennystatin (MYCOSTATIN) 100000 UNIT/ML suspension Take 5 mLs (500,000 Units total) by mouth 4 (four) times daily.  .Marland Kitchenomeprazole (PRILOSEC) 20 MG capsule TAKE 1 CAPSULE (20 MG TOTAL) BY MOUTH DAILY.  .Marland KitchenpredniSONE (DELTASONE) 5 MG tablet TAKE 1/2 TABLET ONCE DAILY (Patient taking differently: Take 5 mg by mouth daily with breakfast. )  . spironolactone (ALDACTONE) 50 MG tablet TAKE 1 TABLET BY MOUTH EVERY DAY (Patient taking differently: Take 50 mg by mouth. Biweekly)  . traMADol (ULTRAM) 50 MG tablet TAKE 1 TABLET BY MOUTH EVERY 8 HOURS AS NEEDED (Patient taking differently: Take 100 mg by mouth daily as needed. )  . vitamin B-12 (CYANOCOBALAMIN) 100 MCG tablet Take 100 mcg by mouth daily.  . [DISCONTINUED] amoxicillin-clavulanate (AUGMENTIN) 875-125 MG tablet Take 1 tablet by mouth 2 (two) times daily. With food  . [DISCONTINUED] azithromycin (ZITHROMAX) 250 MG tablet 2 pills day 1 and 1 pill day 2-5  . atorvastatin (LIPITOR) 10 MG tablet Take 1 tablet (10 mg total) by mouth daily. (Patient not taking: Reported on 08/31/2018)   No facility-administered encounter medications on file as of 08/31/2018.  Allergies (verified) Hydrocodone and Oxycodone   History: Past Medical History:  Diagnosis Date  . Asthma   . Autoimmune hepatitis ([place])   . Depression   . Diabetes mellitus without complication ([place])   . DUB (dysfunctional uterine bleeding)    2008 s/p hysterectomy  . Exposure to TB    tx'ed x 6 months  . Gallstones   . Gastric ulcer   . GERD (gastroesophageal reflux disease)   . History of chicken pox   . Hx of migraines   . Liver disease   . Osteoarthritis   . Systemic lupus erythematosus ([place]) 05/31/2010    Qualifier: Diagnosis of  By: Deborra Medina MD, Tanja Port    . Vitamin D deficiency    Past Surgical History:  Procedure Laterality Date  . ABDOMINAL HYSTERECTOMY    . BACK SURGERY     L4/5 herniated disc repair 1992   . LAPAROSCOPIC HYSTERECTOMY    . TUBAL LIGATION     Family History  Problem Relation Age of Onset  . Cancer Mother        lung cancer smoker  . Heart disease Father   . Hypertension Father   . Hypertension Sister   . Sarcoidosis Sister        lung transplant failure  . Heart disease Brother   . Alcohol abuse Brother   . Diabetes Sister   . Diabetes Sister   . Kidney disease Sister   . Hypertension Sister   . Rheum arthritis Sister   . Diabetes Son   . Hypertension Son    Social History   Socioeconomic History  . Marital status: Legally Separated    Spouse name: Not on file  . Number of children: Not on file  . Years of education: Not on file  . Highest education level: Not on file  Occupational History  . Not on file  Social Needs  . Financial resource strain: Not hard at all  . Food insecurity:    Worry: Never true    Inability: Never true  . Transportation needs:    Medical: No    Non-medical: No  Tobacco Use  . Smoking status: Current Every Day Smoker    Packs/day: 0.25    Years: 20.00    Pack years: 5.00    Types: Cigarettes  . Smokeless tobacco: Never Used  Substance and Sexual Activity  . Alcohol use: No  . Drug use: No  . Sexual activity: Not Currently  Lifestyle  . Physical activity:    Days per week: 5 days    Minutes per session: 30 min  . Stress: Not at all  Relationships  . Social connections:    Talks on phone: Not on file    Gets together: Not on file    Attends religious service: Not on file    Active member of club or organization: Not on file    Attends meetings of clubs or organizations: Not on file    Relationship status: Not on file  Other Topics Concern  . Not on file  Social History Narrative   4 pregnancies, 3 kids     Smoker    Technical sales engineer ed former LPN    Tobacco Counseling Ready to quit: Not Answered Counseling given: Not Answered   Clinical Intake:  Pre-visit preparation completed: Yes           How often do you need to have someone help you when you read instructions, pamphlets, or other  written materials from your doctor or pharmacy?: 1 - Never  Interpreter Needed?: No      Activities of Daily Living In your present state of health, do you have any difficulty performing the following activities: 08/31/2018  Hearing? N  Vision? N  Difficulty concentrating or making decisions? N  Walking or climbing stairs? Y  Comment Notes unsteady gate when ambulating; paces herself.  Dressing or bathing? N  Doing errands, shopping? N  Preparing Food and eating ? N  Using the Toilet? N  In the past six months, have you accidently leaked urine? N  Do you have problems with loss of bowel control? N  Managing your Medications? N  Managing your Finances? N  Housekeeping or managing your Housekeeping? N  Some recent data might be hidden     Immunizations and Health Maintenance Immunization History  Administered Date(s) Administered  . Influenza Split 03/11/2011, 03/26/2012  . Influenza, High Dose Seasonal PF 02/26/2018  . Influenza,inj,Quad PF,6+ Mos 02/14/2013, 03/11/2017  . Influenza-Unspecified 02/09/2013  . PPD Test 06/18/2009  . Pneumococcal Conjugate-13 04/24/2014  . Td 05/16/2003  . Tdap 11/04/2010   Health Maintenance Due  Topic Date Due  . PNEUMOCOCCAL POLYSACCHARIDE VACCINE AGE 8-64 HIGH RISK  04/14/1960  . FOOT EXAM  04/14/1968  . HIV Screening  04/14/1973  . PAP SMEAR-Modifier  04/15/1979  . MAMMOGRAM  04/14/2008  . HEMOGLOBIN A1C  08/31/2018    Patient Care Team: McLean-Scocuzza, Nino Glow, MD as PCP - General (Internal Medicine)  Indicate any recent Medical Services you may have received from other than [facility] providers in the past year (date may be  approximate).     Assessment:   This is a routine wellness examination for Hala.  I connected with patient 08/31/18 at 10:00 AM EDT by a video enabled telemedicine application and verified that I am speaking with the correct person using two identifiers. Patient stated full name and DOB. Patient gave permission to continue with virtual visit. Patient's location was at home and Nurse's location was at [place] office.   Medication- no longer taking atorvastatin due to increased muscle/joint pain. Taking all other medications as directed. Notes she is monitoring her diet.  Health Screenings  Pap Smear -due. She plans to schedule with pcp as soon as possible.  Colonoscopy -07/08/16 Bone Density -09/30/10 Glaucoma -none reported. Hearing -demonstrates normal hearing during conversation. Hemoglobin A1C -03/01/18 (6.1) Cholesterol -03/01/18 (224) Dental- scheduled wisdom tooth extraction. Annual visits.   Social  Alcohol intake -none Smoking history- current; plans to start Chantix. Smokers in home? Yes, self Illicit drug use? none Exercise -core strengthening exercises, walking every other day. Diet -low cholesterol Sexually Active -no  Safety  Patient feels safe at home.  Patient does have smoke detectors at home. Patient does wear sunscreen or protective clothing when in direct sunlight. Patient does wear seat belt when driving or riding with others.   Activities of Daily Living Patient can do their own household chores. Denies needing assistance with: driving, feeding themselves, getting from bed to chair, getting to the toilet, bathing/showering, dressing, managing money, climbing flight of stairs, or preparing meals. She has a foldable cane and 4 wheel walker to use as needed.   Depression Screen Patient denies losing interest in daily life, feeling hopeless, or crying easily over simple problems. Reports medication is working well.  Fall Screen Unsteady gait. 1 fall in the  house in the last 12 months, no injury.    Memory Screen Patient denies problems  with memory, misplacing items, and is able to balance checkbook/bank accounts.  Patient is alert, normal appearance, oriented to person/place/and time. Correctly identified the president of the [place], recall of 2/3 objects, and performing simple calculations.  Patient displays appropriate judgement and can read correct time from watch face.  She reads, completes sodoku and word puzzles for brain exercise/engagement.  Immunizations The following Immunizations were discussed: Influenza, shingles, pneumonia, and tetanus.   Other Providers Patient Care Team: McLean-Scocuzza, Nino Glow, MD as PCP - General (Internal Medicine)  Hearing/Vision screen Hearing Screening Comments: Patient is able to hear conversational tones without difficulty.  No issues reported.   Vision Screening Comments: Followed by [facility] Wears corrective lenses Visual acuity not assessed due to virtual visit. She has seen her ophthalmologist in the last 12 months.  Dietary issues and exercise activities discussed: Current Exercise Habits: Home exercise routine, Type of exercise: walking;stretching, Time (Minutes): 30, Frequency (Times/Week): 3, Weekly Exercise (Minutes/Week): 90, Intensity: Mild  Goals      Patient Stated   . Client plans to start chantix and stop smoking (pt-stated)      Depression Screen PHQ 2/9 Scores 08/31/2018 04/07/2018 07/31/2017  PHQ - 2 Score 0 0 4  PHQ- 9 Score - - 16    Fall Risk Fall Risk  08/31/2018 04/07/2018 07/31/2017  Falls in the past year? 1 0 Yes  Number falls in past yr: 0 - 1  Injury with Fall? 0 - Yes  Risk for fall due to : Impaired balance/gait - -   Cognitive Function:     6CIT Screen 08/31/2018  What Year? 0 points  What month? 0 points  What time? 0 points  Count back from 20 0 points  Months in reverse 0 points  Repeat phrase 0 points  Total Score 0    Screening  Tests Health Maintenance  Topic Date Due  . PNEUMOCOCCAL POLYSACCHARIDE VACCINE AGE 35-64 HIGH RISK  04/14/1960  . FOOT EXAM  04/14/1968  . HIV Screening  04/14/1973  . PAP SMEAR-Modifier  04/15/1979  . MAMMOGRAM  04/14/2008  . HEMOGLOBIN A1C  08/31/2018  . INFLUENZA VACCINE  12/11/2018  . OPHTHALMOLOGY EXAM  01/02/2019  . URINE MICROALBUMIN  01/16/2019  . TETANUS/TDAP  11/03/2020  . COLONOSCOPY  07/08/2026  . Hepatitis C Screening  Completed     Plan:    End of life planning; Advance aging; Advanced directives discussed. Copy of current HCPOA/Living Will requested upon completion.    I have personally reviewed and noted the following in the patient's chart:   . Medical and social history . Use of alcohol, tobacco or illicit drugs  . Current medications and supplements . Functional ability and status . Nutritional status . Physical activity . Advanced directives . List of other physicians . Hospitalizations, surgeries, and ER visits in previous 12 months . Vitals . Screenings to include cognitive, depression, and falls . Referrals and appointments  In addition, I have reviewed and discussed with patient certain preventive protocols, quality metrics, and best practice recommendations. A written personalized care plan for preventive services as well as general preventive health recommendations were provided to patient.     Varney Biles, LPN   [id number]

## 2018-09-07 DIAGNOSIS — K7460 Unspecified cirrhosis of liver: Secondary | ICD-10-CM | POA: Diagnosis not present

## 2018-09-08 NOTE — Progress Notes (Signed)
Agree with note   TMS 

## 2018-09-22 DIAGNOSIS — K7460 Unspecified cirrhosis of liver: Secondary | ICD-10-CM | POA: Diagnosis not present

## 2018-09-22 DIAGNOSIS — M3211 Endocarditis in systemic lupus erythematosus: Secondary | ICD-10-CM | POA: Diagnosis not present

## 2018-09-22 DIAGNOSIS — K7469 Other cirrhosis of liver: Secondary | ICD-10-CM | POA: Diagnosis not present

## 2018-09-22 DIAGNOSIS — Z79899 Other long term (current) drug therapy: Secondary | ICD-10-CM | POA: Diagnosis not present

## 2018-09-22 DIAGNOSIS — M3219 Other organ or system involvement in systemic lupus erythematosus: Secondary | ICD-10-CM | POA: Diagnosis not present

## 2018-09-22 DIAGNOSIS — K754 Autoimmune hepatitis: Secondary | ICD-10-CM | POA: Diagnosis not present

## 2018-10-05 ENCOUNTER — Encounter: Payer: Self-pay | Admitting: Internal Medicine

## 2018-10-05 ENCOUNTER — Ambulatory Visit (INDEPENDENT_AMBULATORY_CARE_PROVIDER_SITE_OTHER): Payer: Medicare Other | Admitting: Internal Medicine

## 2018-10-05 ENCOUNTER — Other Ambulatory Visit: Payer: Self-pay

## 2018-10-05 DIAGNOSIS — M3219 Other organ or system involvement in systemic lupus erythematosus: Secondary | ICD-10-CM

## 2018-10-05 DIAGNOSIS — E099 Drug or chemical induced diabetes mellitus without complications: Secondary | ICD-10-CM

## 2018-10-05 DIAGNOSIS — K8020 Calculus of gallbladder without cholecystitis without obstruction: Secondary | ICD-10-CM | POA: Insufficient documentation

## 2018-10-05 DIAGNOSIS — R3915 Urgency of urination: Secondary | ICD-10-CM

## 2018-10-05 DIAGNOSIS — K754 Autoimmune hepatitis: Secondary | ICD-10-CM

## 2018-10-05 DIAGNOSIS — E785 Hyperlipidemia, unspecified: Secondary | ICD-10-CM | POA: Diagnosis not present

## 2018-10-05 DIAGNOSIS — T380X5A Adverse effect of glucocorticoids and synthetic analogues, initial encounter: Secondary | ICD-10-CM

## 2018-10-05 DIAGNOSIS — M797 Fibromyalgia: Secondary | ICD-10-CM | POA: Diagnosis not present

## 2018-10-05 DIAGNOSIS — R350 Frequency of micturition: Secondary | ICD-10-CM

## 2018-10-05 DIAGNOSIS — K7460 Unspecified cirrhosis of liver: Secondary | ICD-10-CM

## 2018-10-05 DIAGNOSIS — D72819 Decreased white blood cell count, unspecified: Secondary | ICD-10-CM

## 2018-10-05 DIAGNOSIS — G8929 Other chronic pain: Secondary | ICD-10-CM

## 2018-10-05 NOTE — Progress Notes (Signed)
Virtual Visit via Video Note  I connected with Shannon [name]  on 10/05/18 at  9:30 AM EDT by a video enabled telemedicine application and verified that I am speaking with the correct person using two identifiers.  Location patient: home Location provider:work  Persons participating in the virtual visit: patient, provider  I discussed the limitations of evaluation and management by telemedicine and the availability of in person appointments. The patient expressed understanding and agreed to proceed.   HPI: 1. Chronic medical issues AIH, cirrhosis with portal HTN, fibromyalgia, lupus with chronic pain she is now on Cosyntx 300 mg 1x per month was on lower dose per [facility] but after 2 weeks had increased pain so the increased her dose and last dose was on 5/8 or 09/18/2018. She feels her body wants to shut down.  Reviewed labs 07/30/18 increased lfts in the 40s ast and alt repeat normal 09/22/2018, CBC 09/22/2018 WBC 4.0, 14.2/44.9/plts 172 inr 1.09 AFT 4 normal ggt 28 normal cmet 09/22/2018 normal  UA with trace blood and bacteria  2. DM 2 A1C 6.1 03/01/18 she is taking metformin unable to take lipitor 5 mg caused increased muscle aches and pains  3. Asthma controlled  4. Urine frequency and urgency x 1 month reviewed urine 09/22/2018 she declines to check urine culture for now and will get back to me if she has sx's  ROS: See pertinent positives and negatives per HPI. General: denies fever  GU: hot/cold flashes/+urine freq and urgency  MSK: chronic pain   Past Medical History:  Diagnosis Date  . Asthma   . Autoimmune hepatitis ([place])   . Depression   . Diabetes mellitus without complication ([place])   . DUB (dysfunctional uterine bleeding)    2008 s/p hysterectomy  . Exposure to TB    tx'ed x 6 months  . Gallstones   . Gastric ulcer   . GERD (gastroesophageal reflux disease)   . History of chicken pox   . Hx of migraines   . Liver disease   . Osteoarthritis   . Systemic lupus  erythematosus ([place]) 05/31/2010   Qualifier: Diagnosis of  By: Deborra Medina MD, Tanja Port    . Vitamin D deficiency     Past Surgical History:  Procedure Laterality Date  . ABDOMINAL HYSTERECTOMY    . BACK SURGERY     L4/5 herniated disc repair 1992   . LAPAROSCOPIC HYSTERECTOMY    . TUBAL LIGATION      Family History  Problem Relation Age of Onset  . Cancer Mother        lung cancer smoker  . Heart disease Father   . Hypertension Father   . Hypertension Sister   . Sarcoidosis Sister        lung transplant failure  . Heart disease Brother   . Alcohol abuse Brother   . Diabetes Sister   . Diabetes Sister   . Kidney disease Sister   . Hypertension Sister   . Rheum arthritis Sister   . Diabetes Son   . Hypertension Son     SOCIAL HX:  4 pregnancies, 3 kids  Smoker  Designer, jewellery ed former LPN Not currently sexually active    Current Outpatient Medications:  .  albuterol (PROAIR HFA) 108 (90 Base) MCG/ACT inhaler, Inhale 1-2 puffs into the lungs every 6 (six) hours as needed for wheezing or shortness of breath., Disp: 1 Inhaler, Rfl: 12 .  azaTHIOprine (IMURAN) 50 MG tablet, Take 100 mg by mouth 2 (  two) times daily. , Disp: , Rfl:  .  Blood Glucose Monitoring Suppl (ONE TOUCH ULTRA SYSTEM KIT) W/DEVICE KIT, Test blood sugar as directed Dx E09.9, Disp: 1 each, Rfl: 0 .  calcium-vitamin D (OSCAL WITH D) 500-200 MG-UNIT per tablet, Take 1 tablet by mouth., Disp: , Rfl:  .  Cholecalciferol (VITAMIN D3) 5000 units TABS, Take by mouth. , Disp: , Rfl:  .  diclofenac sodium (VOLTAREN) 1 % GEL, Apply topically 4 (four) times daily., Disp: , Rfl:  .  glucose blood test strip, Use as instructed, Disp: 100 each, Rfl: 12 .  hydroxychloroquine (PLAQUENIL) 200 MG tablet, Take 200 mg by mouth 2 (two) times daily. , Disp: , Rfl: 11 .  ibuprofen (ADVIL,MOTRIN) 800 MG tablet, TAKE 1 TABLET (800 MG TOTAL) BY MOUTH DAILY., Disp: 30 tablet, Rfl: 5 .  lactulose (CHRONULAC) 10 GM/15ML solution, TAKE  30 ML BY MOUTH 4 TIMES DAILY AS NEEDED (Patient taking differently: Take 30 g by mouth. 50-60 g 1x per day not 30 g qid), Disp: 10800 mL, Rfl: 3 .  lidocaine (LIDODERM) 5 %, Place 1 patch onto the skin daily. Remove & Discard patch within 12 hours or as directed by MD, Disp: 30 patch, Rfl: 5 .  loratadine (CLARITIN) 10 MG tablet, Take 10 mg by mouth every other day as needed for allergies., Disp: , Rfl:  .  Magnesium Oxide 500 MG TABS, Take by mouth., Disp: , Rfl:  .  metFORMIN (GLUCOPHAGE) 500 MG tablet, TAKE 1 TABLET (500 MG TOTAL) BY MOUTH DAILY WITH BREAKFAST., Disp: 90 tablet, Rfl: 3 .  omeprazole (PRILOSEC) 20 MG capsule, TAKE 1 CAPSULE (20 MG TOTAL) BY MOUTH DAILY., Disp: 90 capsule, Rfl: 1 .  predniSONE (DELTASONE) 5 MG tablet, TAKE 1/2 TABLET ONCE DAILY (Patient taking differently: Take 5 mg by mouth daily with breakfast. ), Disp: 30 tablet, Rfl: 2 .  Secukinumab, 300 MG Dose, (COSENTYX SENSOREADY, 300 MG,) 150 MG/ML SOAJ, Inject into the skin., Disp: , Rfl:  .  spironolactone (ALDACTONE) 50 MG tablet, TAKE 1 TABLET BY MOUTH EVERY DAY (Patient taking differently: Take 50 mg by mouth. Biweekly), Disp: 90 tablet, Rfl: 1 .  traMADol (ULTRAM) 50 MG tablet, TAKE 1 TABLET BY MOUTH EVERY 8 HOURS AS NEEDED (Patient taking differently: Take 100 mg by mouth daily as needed. ), Disp: 60 tablet, Rfl: 0 .  vitamin B-12 (CYANOCOBALAMIN) 100 MCG tablet, Take 100 mcg by mouth daily., Disp: , Rfl:   EXAM:  VITALS per patient if applicable:  GENERAL: alert, oriented, appears well and in no acute distress  HEENT: atraumatic, conjunttiva clear, no obvious abnormalities on inspection of external nose and ears  NECK: normal movements of the head and neck  LUNGS: on inspection no signs of respiratory distress, breathing rate appears normal, no obvious gross SOB, gasping or wheezing  CV: no obvious cyanosis  MS: moves all visible extremities without noticeable abnormality  PSYCH/NEURO: pleasant and  cooperative, no obvious depression or anxiety, speech and thought processing grossly intact  ASSESSMENT AND PLAN:  Discussed the following assessment and plan:  Autoimmune hepatitis  Systemic lupus erythematosus with other organ involvement, unspecified SLE type  Fibromyalgia Other chronic pain -rec get tramadol from [facility]  -cont chronic meds imuran 100 mg bid, plaquenil 200 mg qd, cosentyx 300 mg 1x per month   Steroid-induced diabetes A1C 6.1 03/01/18  -will need to check lipid, A1C, in future pt wants to get [facility] lab  -consider pna 23 vaccine in  future  -will do foot exam in future  -did not tolerate statin   Hyperlipidemia, unspecified hyperlipidemia type-could not tolerate lipitor 5 mg qhs   Hepatic cirrhosis with portal HTN Gallstones -f/u [facility] EGD due 10/2019  -labs 09/22/2018 normal   Urine freq and urgency 09/22/2018 ua with trace blood and bacteria  -declines urine culture for now will call back if getting worse   HM Flu shot utd  Consider shingrix in future  prevnar had 04/24/14, consider pna 23 in future  Tdap had 11/04/10  Hep A/B immune   Pap had 03/31/07 cant exclude HSIL s/p hysterectomy for DUB in 2008 given history rec pt to think about it  -she is agreeable will do this at f/u   Mammogram pt declined on multiple occasions   DEXA 10/05/15 normal, 10/06/17 lumbar and femur neck osteopenia Vitamin D 22.8 07/13/17-rec D3 5000 IU qd  Colonoscopy 07/08/16 diverticulosis, polpys x 3 hyperplastic, IH [facility] q 10 years  EGD 10/30/17 mild portal hypertensive gastropathy in gastric body  HCV neg 03/19/10, hep B sAg neg 03/19/10  Consider HIV check in future will disc with pt   Smoking x 40 years 1 pk lasts 2 days max 1 ppd FH mom lung was smoker -rec cessation  I discussed the assessment and treatment plan with the patient. The patient was provided an opportunity to ask questions and all were answered. The patient agreed with the plan and demonstrated an  understanding of the instructions.   The patient was advised to call back or seek an in-person evaluation if the symptoms worsen or if the condition fails to improve as anticipated.  Time spent 15 minutes  Delorise Jackson, MD

## 2018-10-20 DIAGNOSIS — M797 Fibromyalgia: Secondary | ICD-10-CM | POA: Diagnosis not present

## 2018-10-20 DIAGNOSIS — M7531 Calcific tendinitis of right shoulder: Secondary | ICD-10-CM | POA: Diagnosis not present

## 2018-10-20 DIAGNOSIS — M25512 Pain in left shoulder: Secondary | ICD-10-CM | POA: Diagnosis not present

## 2018-10-20 DIAGNOSIS — M7542 Impingement syndrome of left shoulder: Secondary | ICD-10-CM | POA: Diagnosis not present

## 2018-10-20 DIAGNOSIS — M67912 Unspecified disorder of synovium and tendon, left shoulder: Secondary | ICD-10-CM | POA: Diagnosis not present

## 2018-10-25 DIAGNOSIS — K7689 Other specified diseases of liver: Secondary | ICD-10-CM | POA: Diagnosis not present

## 2018-10-25 DIAGNOSIS — K754 Autoimmune hepatitis: Secondary | ICD-10-CM | POA: Diagnosis not present

## 2018-10-25 DIAGNOSIS — K7469 Other cirrhosis of liver: Secondary | ICD-10-CM | POA: Diagnosis not present

## 2018-10-25 DIAGNOSIS — K8020 Calculus of gallbladder without cholecystitis without obstruction: Secondary | ICD-10-CM | POA: Diagnosis not present

## 2018-11-22 DIAGNOSIS — M2012 Hallux valgus (acquired), left foot: Secondary | ICD-10-CM | POA: Diagnosis not present

## 2018-11-22 DIAGNOSIS — M2042 Other hammer toe(s) (acquired), left foot: Secondary | ICD-10-CM | POA: Diagnosis not present

## 2018-11-22 DIAGNOSIS — M79672 Pain in left foot: Secondary | ICD-10-CM | POA: Diagnosis not present

## 2018-12-23 ENCOUNTER — Other Ambulatory Visit: Payer: Self-pay

## 2018-12-23 ENCOUNTER — Encounter: Payer: Self-pay | Admitting: Emergency Medicine

## 2018-12-23 ENCOUNTER — Ambulatory Visit
Admission: EM | Admit: 2018-12-23 | Discharge: 2018-12-23 | Disposition: A | Discharge status: Home / Self Care | Payer: Medicare Other | Attending: Family Medicine | Admitting: Family Medicine

## 2018-12-23 DIAGNOSIS — F17210 Nicotine dependence, cigarettes, uncomplicated: Secondary | ICD-10-CM | POA: Diagnosis not present

## 2018-12-23 DIAGNOSIS — J988 Other specified respiratory disorders: Secondary | ICD-10-CM

## 2018-12-23 MED ORDER — AMOXICILLIN-POT CLAVULANATE 875-125 MG PO TABS: 1.0000 | ORAL_TABLET | Freq: Two times a day (BID) | ORAL | 0 refills | Status: DC

## 2018-12-23 MED ORDER — FLUCONAZOLE 200 MG PO TABS: 200.0000 mg | ORAL_TABLET | Freq: Every day | ORAL | 0 refills | Status: AC

## 2018-12-23 NOTE — ED Triage Notes (Signed)
Pt c/o cough, nasal congestion, bilateral ear pain right worse than left, and headache started about 3 days ago. Denies fever or shortness of breath.

## 2018-12-23 NOTE — ED Provider Notes (Signed)
[facility]    CSN: [id number] Arrival date & time: 12/23/18  0950  History   Chief Complaint Chief Complaint  Patient presents with  . Cough    APPT  . Nasal Congestion  . Otalgia    bilateral   HPI  63 year old female presents with the above complaints.  Patient reports that she has had ongoing symptoms for the past few weeks.  Slowly have been worsening.  She reports worsening productive cough, nasal congestion and sinus pain/pressure.  She also notes bilateral ear pain.  Denies fever.  Denies shortness of breath.  Patient is concerned that she has sinusitis and bronchitis.  Patient states that she is particularly concerned given the fact that she is immunosuppressed.  She is requesting a COVID test today.  No medications or interventions tried.  No other associated symptoms.   PMH, Surgical Hx, Family Hx, Social History reviewed and updated as below.  Past Medical History:  Diagnosis Date  . Asthma   . Autoimmune hepatitis ([place])   . Depression   . Diabetes mellitus without complication ([place])   . DUB (dysfunctional uterine bleeding)    2008 s/p hysterectomy  . Exposure to TB    tx'ed x 6 months  . Gallstones   . Gastric ulcer   . GERD (gastroesophageal reflux disease)   . History of chicken pox   . Hx of migraines   . Liver disease   . Osteoarthritis   . Systemic lupus erythematosus ([place]) 05/31/2010   Qualifier: Diagnosis of  By: Deborra Medina MD, Tanja Port    . Vitamin D deficiency     Patient Active Problem List   Diagnosis Date Noted  . Gallstones 10/05/2018  . Mild intermittent asthma with exacerbation 04/07/2018  . History of colon polyps 03/03/2018  . Cirrhosis of liver ([place]) 02/26/2018  . Sacroiliac joint pain 02/26/2018  . Arthritis 02/26/2018  . Chronic pain 02/26/2018  . History of asthma 02/26/2018  . Portal hypertensive gastropathy ([place]) 01/23/2015  . Fatigue 01/23/2015  . HLD (hyperlipidemia) 11/23/2013  . Tobacco abuse 11/23/2013  .  Fibromyalgia 08/16/2013  . Abdominal bloating 08/16/2013  . Lumbosacral radiculopathy at L5 03/26/2012  . OA (osteoarthritis) of knee 03/26/2012  . Candidiasis 10/08/2011  . Hyperpigmentation 11/04/2010  . Adjustment disorder 11/04/2010  . Steroid-induced diabetes ([place]) 05/31/2010  . Vitamin D deficiency 05/31/2010  . Autoimmune hepatitis ([place]) 05/31/2010  . Systemic lupus erythematosus ([place]) 05/31/2010    Past Surgical History:  Procedure Laterality Date  . ABDOMINAL HYSTERECTOMY    . BACK SURGERY     L4/5 herniated disc repair 1992   . LAPAROSCOPIC HYSTERECTOMY    . TUBAL LIGATION      OB History   No obstetric history on file.      Home Medications    Prior to Admission medications   Medication Sig Start Date End Date Taking? Authorizing Provider  albuterol (PROAIR HFA) 108 (90 Base) MCG/ACT inhaler Inhale 1-2 puffs into the lungs every 6 (six) hours as needed for wheezing or shortness of breath. 04/07/18  Yes McLean-Scocuzza, Nino Glow, MD  azaTHIOprine (IMURAN) 50 MG tablet Take 100 mg by mouth 2 (two) times daily.    Yes [provider]  calcium-vitamin D (OSCAL WITH D) 500-200 MG-UNIT per tablet Take 1 tablet by mouth.   Yes [provider]  Cholecalciferol (VITAMIN D3) 5000 units TABS Take by mouth.    Yes [provider]  diclofenac sodium (VOLTAREN) 1 % GEL Apply topically 4 (  four) times daily.   Yes [provider]  hydroxychloroquine (PLAQUENIL) 200 MG tablet Take 200 mg by mouth 2 (two) times daily.  08/02/15  Yes [provider]  lidocaine (LIDODERM) 5 % Place 1 patch onto the skin daily. Remove & Discard patch within 12 hours or as directed by MD 09/22/12  Yes Copland, Frederico Hamman, MD  loratadine (CLARITIN) 10 MG tablet Take 10 mg by mouth every other day as needed for allergies.   Yes [provider]  Magnesium Oxide 500 MG TABS Take by mouth.   Yes [provider]  metFORMIN (GLUCOPHAGE) 500 MG tablet  TAKE 1 TABLET (500 MG TOTAL) BY MOUTH DAILY WITH BREAKFAST. 05/19/18  Yes McLean-Scocuzza, Nino Glow, MD  omeprazole (PRILOSEC) 20 MG capsule TAKE 1 CAPSULE (20 MG TOTAL) BY MOUTH DAILY. 01/15/16  Yes Lucille Passy, MD  predniSONE (DELTASONE) 5 MG tablet TAKE 1/2 TABLET ONCE DAILY Patient taking differently: Take 5 mg by mouth daily with breakfast.  08/20/15  Yes Lucille Passy, MD  Secukinumab, 300 MG Dose, (COSENTYX SENSOREADY, 300 MG,) 150 MG/ML SOAJ Inject into the skin. 08/25/18  Yes [provider]  spironolactone (ALDACTONE) 50 MG tablet TAKE 1 TABLET BY MOUTH EVERY DAY Patient taking differently: Take 50 mg by mouth. Biweekly 03/06/15  Yes Lucille Passy, MD  traMADol (ULTRAM) 50 MG tablet TAKE 1 TABLET BY MOUTH EVERY 8 HOURS AS NEEDED Patient taking differently: Take 100 mg by mouth daily as needed.  11/18/13  Yes Lucille Passy, MD  amoxicillin-clavulanate (AUGMENTIN) 875-125 MG tablet Take 1 tablet by mouth every 12 (twelve) hours. 12/23/18   Coral Spikes, DO  Blood Glucose Monitoring Suppl (ONE TOUCH ULTRA SYSTEM KIT) W/DEVICE KIT Test blood sugar as directed Dx E09.9 04/28/14   Lucille Passy, MD  fluconazole (DIFLUCAN) 200 MG tablet Take 1 tablet (200 mg total) by mouth daily for 7 days. 12/23/18 12/30/18  Thersa Salt G, DO  glucose blood test strip Use as instructed 04/24/14   Lucille Passy, MD  ibuprofen (ADVIL,MOTRIN) 800 MG tablet TAKE 1 TABLET (800 MG TOTAL) BY MOUTH DAILY. 01/31/15   Lucille Passy, MD  lactulose (CHRONULAC) 10 GM/15ML solution TAKE 30 ML BY MOUTH 4 TIMES DAILY AS NEEDED Patient taking differently: Take 30 g by mouth. 50-60 g 1x per day not 30 g qid 09/26/14   Lucille Passy, MD  vitamin B-12 (CYANOCOBALAMIN) 100 MCG tablet Take 100 mcg by mouth daily.    [provider]    Family History Family History  Problem Relation Age of Onset  . Cancer Mother        lung cancer smoker  . Heart disease Father   . Hypertension Father   . Hypertension Sister   .  Sarcoidosis Sister        lung transplant failure  . Heart disease Brother   . Alcohol abuse Brother   . Diabetes Sister   . Diabetes Sister   . Kidney disease Sister   . Hypertension Sister   . Rheum arthritis Sister   . Diabetes Son   . Hypertension Son     Social History Social History   Tobacco Use  . Smoking status: Current Every Day Smoker    Packs/day: 0.50    Years: 20.00    Pack years: 10.00    Types: Cigarettes  . Smokeless tobacco: Never Used  Substance Use Topics  . Alcohol use: No  . Drug use: No  Allergies   Hydrocodone, Lipitor [atorvastatin calcium], and Oxycodone   Review of Systems Review of Systems  Constitutional: Negative for fever.  HENT: Positive for congestion, ear pain, sinus pressure and sinus pain.   Respiratory: Positive for cough.    Physical Exam Triage Vital Signs ED Triage Vitals  Enc Vitals Group     BP 12/23/18 1024 (!) 126/104     Pulse Rate 12/23/18 1024 79     Resp 12/23/18 1024 18     Temp 12/23/18 1024 98.3 F (36.8 C)     Temp Source 12/23/18 1024 Oral     SpO2 12/23/18 1024 100 %     Weight 12/23/18 1017 194 lb (88 kg)     Height 12/23/18 1017 5' 6"  (1.676 m)     Head Circumference --      Peak Flow --      Pain Score 12/23/18 1017 7     Pain Loc --      Pain Edu? --      Excl. in [place]? --    Updated Vital Signs BP (!) 126/104 (BP Location: Left Arm)   Pulse 79   Temp 98.3 F (36.8 C) (Oral)   Resp 18   Ht 5' 6"  (1.676 m)   Wt 88 kg   SpO2 100%   BMI 31.31 kg/m   Visual Acuity Right Eye Distance:   Left Eye Distance:   Bilateral Distance:    Right Eye Near:   Left Eye Near:    Bilateral Near:     Physical Exam Constitutional:      General: She is not in acute distress.    Appearance: Normal appearance.  HENT:     Head: Normocephalic and atraumatic.     Right Ear: Tympanic membrane normal.     Left Ear: Tympanic membrane normal.     Nose: Congestion present.     Comments: Maxillary sinus  tenderness to palpation. Eyes:     General:        Right eye: No discharge.        Left eye: No discharge.     Conjunctiva/sclera: Conjunctivae normal.  Cardiovascular:     Rate and Rhythm: Normal rate and regular rhythm.  Pulmonary:     Effort: Pulmonary effort is normal.     Breath sounds: No wheezing or rales.  Neurological:     Mental Status: She is alert.  Psychiatric:        Mood and Affect: Mood normal.        Behavior: Behavior normal.    UC Treatments / Results  Labs (all labs ordered are listed, but only abnormal results are displayed) Labs Reviewed  NOVEL CORONAVIRUS, NAA (HOSPITAL ORDER, SEND-OUT TO REF LAB)    EKG   Radiology No results found.  Procedures Procedures (including critical care time)  Medications Ordered in UC Medications - No data to display  Initial Impression / Assessment and Plan / UC Course  I have reviewed the triage vital signs and the nursing notes.  Pertinent labs & imaging results that were available during my care of the patient were reviewed by me and considered in my medical decision making (see chart for details).    63 year old female presents with a respiratory infection.  Awaiting testing results from COVID-19.  Placing on Augmentin empirically.  Patient states thrush with antibiotic therapy and is requesting medication for this as well.  Final Clinical Impressions(s) / UC Diagnoses   Final diagnoses:  Respiratory  infection     Discharge Instructions     Medication as prescribed.  Take care  Dr. Lacinda Axon    ED Prescriptions    Medication Sig Dispense Auth. Provider   amoxicillin-clavulanate (AUGMENTIN) 875-125 MG tablet Take 1 tablet by mouth every 12 (twelve) hours. 14 tablet Layloni Fahrner G, DO   fluconazole (DIFLUCAN) 200 MG tablet Take 1 tablet (200 mg total) by mouth daily for 7 days. 7 tablet Coral Spikes, DO     Controlled Substance Prescriptions [place] Controlled Substance Registry consulted? Not Applicable    Coral Spikes, DO 12/23/18 1127

## 2018-12-23 NOTE — Discharge Instructions (Signed)
Medication as prescribed.  Take care  Dr. Samentha Perham  

## 2018-12-24 LAB — NOVEL CORONAVIRUS, NAA (HOSP ORDER, SEND-OUT TO REF LAB; TAT 18-24 HRS): SARS-CoV-2, NAA: NOT DETECTED

## 2019-01-03 DIAGNOSIS — M2042 Other hammer toe(s) (acquired), left foot: Secondary | ICD-10-CM | POA: Diagnosis not present

## 2019-01-03 DIAGNOSIS — M2012 Hallux valgus (acquired), left foot: Secondary | ICD-10-CM | POA: Diagnosis not present

## 2019-01-03 DIAGNOSIS — M79672 Pain in left foot: Secondary | ICD-10-CM | POA: Diagnosis not present

## 2019-01-04 ENCOUNTER — Other Ambulatory Visit: Payer: Self-pay | Admitting: Podiatry

## 2019-01-26 ENCOUNTER — Telehealth: Payer: Self-pay

## 2019-01-26 NOTE — Telephone Encounter (Signed)
Copied from [place] [id number]. Topic: General - Other >> Jan 26, 2019 11:23 AM Shannon [name] wrote: Reason for CRM: Pt will have labs drawn at [facility] in [place] and will want her A1C added to the order if it is not already / please advise

## 2019-01-27 ENCOUNTER — Telehealth: Payer: Self-pay | Admitting: Internal Medicine

## 2019-01-27 NOTE — Telephone Encounter (Signed)
Patient wanted to inform the provider that she is having foot surgery on Feb 09, 2019.

## 2019-01-27 NOTE — Telephone Encounter (Signed)
Patient is calling checking on the status of her request to have the A1C added to her lab orders for [facility] in [place].

## 2019-01-31 NOTE — Addendum Note (Signed)
Addended by: Orland Mustard on: 01/31/2019 12:37 PM   Modules accepted: Orders

## 2019-01-31 NOTE — Telephone Encounter (Signed)
Call patient   Labs on printer she can pick up and take to [facility] will need to be fasting 8-12 hours before labs appt   [place]

## 2019-02-01 NOTE — Telephone Encounter (Signed)
Patient has been informed.

## 2019-02-02 ENCOUNTER — Other Ambulatory Visit: Payer: Self-pay

## 2019-02-02 ENCOUNTER — Encounter: Payer: Self-pay | Admitting: *Deleted

## 2019-02-03 NOTE — Discharge Instructions (Signed)
[facility] °[facility] ° °POST OPERATIVE INSTRUCTIONS FOR DR. TROXLER, DR. FOWLER, AND DR. BAKER °[facility] ° ° °1. Take your medication as prescribed.  Pain medication should be taken only as needed. ° °2. Keep the dressing clean, dry and intact. ° °3. Keep your foot elevated above the heart level for the first 48 hours. ° °4. Walking to the bathroom and brief periods of walking are acceptable, unless we have instructed you to be non-weight bearing. ° °5. Always wear your post-op shoe when walking.  Always use your crutches if you are to be non-weight bearing. ° °6. Do not take a shower. Baths are permissible as long as the foot is kept out of the water.  ° °7. Every hour you are awake:  °- Bend your knee 15 times. °- Flex foot 15 times °- Massage calf 15 times ° °8. Call [facility] ([phone]) if any of the following problems occur: °- You develop a temperature or fever. °- The bandage becomes saturated with blood. °- Medication does not stop your pain. °- Injury of the foot occurs. °- Any symptoms of infection including redness, odor, or red streaks running from wound. ° °General Anesthesia, Adult, Care After °This sheet gives you information about how to care for yourself after your procedure. Your health care provider may also give you more specific instructions. If you have problems or questions, contact your health care provider. °What can I expect after the procedure? °After the procedure, the following side effects are common: °· Pain or discomfort at the IV site. °· Nausea. °· Vomiting. °· Sore throat. °· Trouble concentrating. °· Feeling cold or chills. °· Weak or tired. °· Sleepiness and fatigue. °· Soreness and body aches. These side effects can affect parts of the body that were not involved in surgery. °Follow these instructions at home: ° °For at least 24 hours after the procedure: °· Have a responsible adult stay with you. It is  important to have someone help care for you until you are awake and alert. °· Rest as needed. °· Do not: °? Participate in activities in which you could fall or become injured. °? Drive. °? Use heavy machinery. °? Drink alcohol. °? Take sleeping pills or medicines that cause drowsiness. °? Make important decisions or sign legal documents. °? Take care of children on your own. °Eating and drinking °· Follow any instructions from your health care provider about eating or drinking restrictions. °· When you feel hungry, start by eating small amounts of foods that are soft and easy to digest (bland), such as toast. Gradually return to your regular diet. °· Drink enough fluid to keep your urine pale yellow. °· If you vomit, rehydrate by drinking water, juice, or clear broth. °General instructions °· If you have sleep apnea, surgery and certain medicines can increase your risk for breathing problems. Follow instructions from your health care provider about wearing your sleep device: °? Anytime you are sleeping, including during daytime naps. °? While taking prescription pain medicines, sleeping medicines, or medicines that make you drowsy. °· Return to your normal activities as told by your health care provider. Ask your health care provider what activities are safe for you. °· Take over-the-counter and prescription medicines only as told by your health care provider. °· If you smoke, do not smoke without supervision. °· Keep all follow-up visits as told by your health care provider. This is important. °Contact a health care provider if: °· You   have nausea or vomiting that does not get better with medicine. °· You cannot eat or drink without vomiting. °· You have pain that does not get better with medicine. °· You are unable to pass urine. °· You develop a skin rash. °· You have a fever. °· You have redness around your IV site that gets worse. °Get help right away if: °· You have difficulty breathing. °· You have chest  pain. °· You have blood in your urine or stool, or you vomit blood. °Summary °· After the procedure, it is common to have a sore throat or nausea. It is also common to feel tired. °· Have a responsible adult stay with you for the first 24 hours after general anesthesia. It is important to have someone help care for you until you are awake and alert. °· When you feel hungry, start by eating small amounts of foods that are soft and easy to digest (bland), such as toast. Gradually return to your regular diet. °· Drink enough fluid to keep your urine pale yellow. °· Return to your normal activities as told by your health care provider. Ask your health care provider what activities are safe for you. °This information is not intended to replace advice given to you by your health care provider. Make sure you discuss any questions you have with your health care provider. °Document Released: 08/04/2000 Document Revised: 05/01/2017 Document Reviewed: 12/12/2016 °Elsevier Patient Education © 2020 [employer] Inc[employer] ° °

## 2019-02-04 ENCOUNTER — Other Ambulatory Visit
Admission: RE | Admit: 2019-02-04 | Discharge: 2019-02-04 | Disposition: A | Discharge status: Home / Self Care | Payer: Medicare Other | Source: Ambulatory Visit | Attending: Podiatry | Admitting: Podiatry

## 2019-02-04 DIAGNOSIS — K7469 Other cirrhosis of liver: Secondary | ICD-10-CM | POA: Diagnosis not present

## 2019-02-04 DIAGNOSIS — M3211 Endocarditis in systemic lupus erythematosus: Secondary | ICD-10-CM | POA: Diagnosis not present

## 2019-02-04 DIAGNOSIS — Z01812 Encounter for preprocedural laboratory examination: Secondary | ICD-10-CM | POA: Diagnosis not present

## 2019-02-04 DIAGNOSIS — Z79899 Other long term (current) drug therapy: Secondary | ICD-10-CM | POA: Diagnosis not present

## 2019-02-04 DIAGNOSIS — Z20828 Contact with and (suspected) exposure to other viral communicable diseases: Secondary | ICD-10-CM | POA: Insufficient documentation

## 2019-02-04 DIAGNOSIS — K754 Autoimmune hepatitis: Secondary | ICD-10-CM | POA: Diagnosis not present

## 2019-02-05 LAB — SARS CORONAVIRUS 2 (TAT 6-24 HRS): SARS Coronavirus 2: NEGATIVE

## 2019-02-09 ENCOUNTER — Ambulatory Visit
Admission: RE | Admit: 2019-02-09 | Discharge: 2019-02-09 | Disposition: A | Discharge status: Home / Self Care | Payer: Medicare Other | Attending: Podiatry | Admitting: Podiatry

## 2019-02-09 ENCOUNTER — Ambulatory Visit: Payer: Medicare Other | Admitting: Anesthesiology

## 2019-02-09 ENCOUNTER — Other Ambulatory Visit: Payer: Self-pay

## 2019-02-09 ENCOUNTER — Encounter
Admission: RE | Disposition: A | Discharge status: Home / Self Care | Payer: Self-pay | Source: Home / Self Care | Attending: Podiatry

## 2019-02-09 DIAGNOSIS — Z79899 Other long term (current) drug therapy: Secondary | ICD-10-CM | POA: Insufficient documentation

## 2019-02-09 DIAGNOSIS — K754 Autoimmune hepatitis: Secondary | ICD-10-CM | POA: Diagnosis not present

## 2019-02-09 DIAGNOSIS — M2012 Hallux valgus (acquired), left foot: Secondary | ICD-10-CM | POA: Diagnosis not present

## 2019-02-09 DIAGNOSIS — M2042 Other hammer toe(s) (acquired), left foot: Secondary | ICD-10-CM | POA: Diagnosis not present

## 2019-02-09 DIAGNOSIS — K7460 Unspecified cirrhosis of liver: Secondary | ICD-10-CM | POA: Diagnosis not present

## 2019-02-09 DIAGNOSIS — E119 Type 2 diabetes mellitus without complications: Secondary | ICD-10-CM | POA: Diagnosis not present

## 2019-02-09 DIAGNOSIS — J45909 Unspecified asthma, uncomplicated: Secondary | ICD-10-CM | POA: Diagnosis not present

## 2019-02-09 DIAGNOSIS — Z7984 Long term (current) use of oral hypoglycemic drugs: Secondary | ICD-10-CM | POA: Insufficient documentation

## 2019-02-09 DIAGNOSIS — F17200 Nicotine dependence, unspecified, uncomplicated: Secondary | ICD-10-CM | POA: Diagnosis not present

## 2019-02-09 DIAGNOSIS — K219 Gastro-esophageal reflux disease without esophagitis: Secondary | ICD-10-CM | POA: Insufficient documentation

## 2019-02-09 HISTORY — DX: Motion sickness, initial encounter: T75.3XXA

## 2019-02-09 HISTORY — PX: HALLUX VALGUS AUSTIN: SHX6623

## 2019-02-09 HISTORY — PX: HAMMER TOE SURGERY: SHX385

## 2019-02-09 LAB — GLUCOSE, CAPILLARY: Glucose-Capillary: 76 mg/dL (ref 70–99)

## 2019-02-09 SURGERY — CORRECTION, HALLUX VALGUS
Anesthesia: General | Site: Toe | Laterality: Left

## 2019-02-09 MED ORDER — BUPIVACAINE-EPINEPHRINE (PF) 0.25% -1:200000 IJ SOLN
INTRAMUSCULAR | Status: DC | PRN
  Administered 2019-02-09: 10 mL

## 2019-02-09 MED ORDER — PROPOFOL 10 MG/ML IV BOLUS
INTRAVENOUS | Status: DC | PRN
  Administered 2019-02-09: 150 mg via INTRAVENOUS

## 2019-02-09 MED ORDER — FENTANYL CITRATE (PF) 100 MCG/2ML IJ SOLN: 25.0000 ug | INTRAMUSCULAR | Status: DC | PRN

## 2019-02-09 MED ORDER — DEXAMETHASONE SODIUM PHOSPHATE 4 MG/ML IJ SOLN
INTRAMUSCULAR | Status: DC | PRN
  Administered 2019-02-09: 10 mg via INTRAVENOUS

## 2019-02-09 MED ORDER — GLYCOPYRROLATE 0.2 MG/ML IJ SOLN
INTRAMUSCULAR | Status: DC | PRN
  Administered 2019-02-09: 0.1 mg via INTRAVENOUS

## 2019-02-09 MED ORDER — ACETAMINOPHEN 325 MG PO TABS
650.0000 mg | ORAL_TABLET | Freq: Once | ORAL | Status: AC
  Administered 2019-02-09: 650 mg via ORAL

## 2019-02-09 MED ORDER — BUPIVACAINE HCL (PF) 0.25 % IJ SOLN
INTRAMUSCULAR | Status: DC | PRN
  Administered 2019-02-09: 5 mL

## 2019-02-09 MED ORDER — ONDANSETRON HCL 4 MG/2ML IJ SOLN
4.0000 mg | Freq: Once | INTRAMUSCULAR | Status: AC | PRN
  Administered 2019-02-09: 4 mg via INTRAVENOUS

## 2019-02-09 MED ORDER — CEFAZOLIN SODIUM-DEXTROSE 2-4 GM/100ML-% IV SOLN
2.0000 g | INTRAVENOUS | Status: AC
  Administered 2019-02-09: 2 g via INTRAVENOUS

## 2019-02-09 MED ORDER — FENTANYL CITRATE (PF) 100 MCG/2ML IJ SOLN
INTRAMUSCULAR | Status: DC | PRN
  Administered 2019-02-09 (×2): 12.5 ug via INTRAVENOUS
  Administered 2019-02-09: 25 ug via INTRAVENOUS

## 2019-02-09 MED ORDER — TRAMADOL HCL 50 MG PO TABS: 50.0000 mg | ORAL_TABLET | Freq: Four times a day (QID) | ORAL | 0 refills | Status: DC | PRN

## 2019-02-09 MED ORDER — MIDAZOLAM HCL 5 MG/5ML IJ SOLN
INTRAMUSCULAR | Status: DC | PRN
  Administered 2019-02-09: 1 mg via INTRAVENOUS

## 2019-02-09 MED ORDER — IBUPROFEN 800 MG PO TABS: 800.0000 mg | ORAL_TABLET | Freq: Three times a day (TID) | ORAL | 0 refills | Status: DC | PRN

## 2019-02-09 MED ORDER — LACTATED RINGERS IV SOLN
INTRAVENOUS | Status: DC | PRN
  Administered 2019-02-09: 12:00:00 via INTRAVENOUS

## 2019-02-09 MED ORDER — POVIDONE-IODINE 7.5 % EX SOLN: Freq: Once | CUTANEOUS | Status: DC

## 2019-02-09 SURGICAL SUPPLY — 44 items
BANDAGE ELASTIC 4 VELCRO NS (GAUZE/BANDAGES/DRESSINGS) ×3 IMPLANT
BENZOIN TINCTURE PRP APPL 2/3 (GAUZE/BANDAGES/DRESSINGS) ×3 IMPLANT
BLADE MED AGGRESSIVE (BLADE) ×3 IMPLANT
BLADE MINI RND TIP GREEN BEAV (BLADE) ×3 IMPLANT
BLADE OSC/SAGITTAL MD 5.5X18 (BLADE) ×3 IMPLANT
BLADE SURG 15 STRL LF DISP TIS (BLADE) ×2 IMPLANT
BLADE SURG 15 STRL SS (BLADE) ×1
BNDG COHESIVE 4X5 TAN STRL (GAUZE/BANDAGES/DRESSINGS) ×3 IMPLANT
BNDG ESMARK 4X12 TAN STRL LF (GAUZE/BANDAGES/DRESSINGS) ×3 IMPLANT
BNDG GAUZE 4.5X4.1 6PLY STRL (MISCELLANEOUS) ×3 IMPLANT
BNDG STRETCH 4X75 STRL LF (GAUZE/BANDAGES/DRESSINGS) ×3 IMPLANT
CANISTER SUCT 1200ML W/VALVE (MISCELLANEOUS) ×3 IMPLANT
COVER LIGHT HANDLE UNIVERSAL (MISCELLANEOUS) ×6 IMPLANT
CUFF TOURN SGL QUICK 18X4 (TOURNIQUET CUFF) ×3 IMPLANT
DRAPE FLUOR MINI C-ARM 54X84 (DRAPES) ×3 IMPLANT
DURAPREP 26ML APPLICATOR (WOUND CARE) ×3 IMPLANT
ELECT REM PT RETURN 9FT ADLT (ELECTROSURGICAL) ×3
ELECTRODE REM PT RTRN 9FT ADLT (ELECTROSURGICAL) ×2 IMPLANT
GAUZE SPONGE 4X4 12PLY STRL (GAUZE/BANDAGES/DRESSINGS) ×3 IMPLANT
GAUZE XEROFORM 1X8 LF (GAUZE/BANDAGES/DRESSINGS) ×3 IMPLANT
GLOVE BIO SURGEON STRL SZ7.5 (GLOVE) ×6 IMPLANT
GLOVE INDICATOR 8.0 STRL GRN (GLOVE) ×6 IMPLANT
GOWN STRL REUS W/ TWL LRG LVL3 (GOWN DISPOSABLE) ×4 IMPLANT
GOWN STRL REUS W/TWL LRG LVL3 (GOWN DISPOSABLE) ×2
K-WIRE DBL END TROCAR 6X.045 (WIRE) ×3
K-WIRE DBL END TROCAR 6X.062 (WIRE)
K-WIRE DBL TROCAR .062X4 ×3 IMPLANT
KIT TURNOVER KIT A (KITS) ×3 IMPLANT
KWIRE DBL END TROCAR 6X.045 (WIRE) ×2 IMPLANT
KWIRE DBL END TROCAR 6X.062 (WIRE) IMPLANT
KWIRE DBL TROCAR .062X4 ×2 IMPLANT
NS IRRIG 500ML POUR BTL (IV SOLUTION) ×3 IMPLANT
PACK EXTREMITY ARMC (MISCELLANEOUS) ×3 IMPLANT
PENCIL SMOKE EVACUATOR (MISCELLANEOUS) ×3 IMPLANT
PIN BALLS 3/8 F/.045 WIRE (MISCELLANEOUS) ×3 IMPLANT
RASP SM TEAR CROSS CUT (RASP) ×3 IMPLANT
STOCKINETTE IMPERVIOUS LG (DRAPES) ×3 IMPLANT
STRIP CLOSURE SKIN 1/4X4 (GAUZE/BANDAGES/DRESSINGS) ×3 IMPLANT
SUT ETHILON 5-0 FS-2 18 BLK (SUTURE) ×3 IMPLANT
SUT MNCRL 5-0+ PC-1 (SUTURE) ×2 IMPLANT
SUT MONOCRYL 5-0 (SUTURE) ×1
SUT VIC AB 3-0 SH 27 (SUTURE) ×1
SUT VIC AB 3-0 SH 27X BRD (SUTURE) ×2 IMPLANT
SUT VIC AB 4-0 FS2 27 (SUTURE) ×3 IMPLANT

## 2019-02-09 NOTE — H&P (Signed)
HISTORY AND PHYSICAL INTERVAL NOTE:  02/09/2019  12:11 PM  Shannon [name]  has presented today for surgery, with the diagnosis of M20.12 HALLUX VALGUS LEFT M20.42  HAMMERTOE LEFT M79.672  ACUTE FOOT PAIN LEFT.  The various methods of treatment have been discussed with the patient.  No guarantees were given.  After consideration of risks, benefits and other options for treatment, the patient has consented to surgery.  I have reviewed the patients' chart and labs.     A history and physical examination was performed in my office.  The patient was reexamined.  There have been no changes to this history and physical examination.  Shannon [name] A

## 2019-02-09 NOTE — Anesthesia Procedure Notes (Signed)
Procedure Name: LMA Insertion Date/Time: 02/09/2019 12:26 PM Performed by: Mayme Genta, CRNA Pre-anesthesia Checklist: Patient identified, Emergency Drugs available, Suction available, Timeout performed and Patient being monitored Patient Re-evaluated:Patient Re-evaluated prior to induction Oxygen Delivery Method: Circle system utilized Preoxygenation: Pre-oxygenation with 100% oxygen Induction Type: IV induction LMA: LMA inserted LMA Size: 4.0 Number of attempts: 1 Placement Confirmation: positive ETCO2 and breath sounds checked- equal and bilateral Tube secured with: Tape

## 2019-02-09 NOTE — Transfer of Care (Signed)
Immediate Anesthesia Transfer of Care Note  Patient: Shannon [name]  Procedure(s) Performed: HALLUX VALGUS AUSTIN ([place]) (Left Foot) HAMMER TOE CORRECTION (Left Toe)  Patient Location: PACU  Anesthesia Type: General LMA  Level of Consciousness: awake, alert  and patient cooperative  Airway and Oxygen Therapy: Patient Spontanous Breathing and Patient connected to supplemental oxygen  Post-op Assessment: Post-op Vital signs reviewed, Patient's Cardiovascular Status Stable, Respiratory Function Stable, Patent Airway and No signs of Nausea or vomiting  Post-op Vital Signs: Reviewed and stable  Complications: No apparent anesthesia complications

## 2019-02-09 NOTE — Op Note (Signed)
Operative note   Surgeon:Annalyse Langlais Lawyer: None    Preop diagnosis: 1.  Left foot hallux valgus deformity 2.  Hammertoe left fifth toe    Postop diagnosis: Same    Procedure: 1.  [place] hallux valgus correction left foot 2.  Arthroplasty left fifth toe PIPJ    EBL: Minimal    Anesthesia:local and general.  Local consisted of 10 cc of 0.25% bupivacaine with epinephrine 1-200,000.  An additional 5 cc of plain 0.25% bupivacaine was used around the fifth toe    Hemostasis: Ankle tourniquet inflated to 200 mmHg for approximately 40 minutes    Specimen: None    Complications: None    Operative indications:Shannon [name] is an 63 y.o. that presents today for surgical intervention.  The risks/benefits/alternatives/complications have been discussed and consent has been given.    Procedure:  Patient was brought into the OR and placed on the operating table in thesupine position. After anesthesia was obtained theleft lower extremity was prepped and draped in usual sterile fashion.  Attention was directed to the dorsomedial left first MTPJ where an incision was performed.  Sharp and blunt dissection carried down to the capsule.  Next a T capsulotomy was then performed.  The dorsomedial eminence was noted and transected.  A V osteotomy was created.  The capital fragment was translocated laterally.  This was stabilized with a 0.062 K wire.  Good alignment was noted.  The ensuing overhanging ledge was then transected.  The area was then smoothed with a power rasp.  A small capsulorrhaphy was performed medially.  The wound was flushed with copious amounts of irrigation.  Layered closure was performed with a 3-0 Vicryl for the deeper layer, 4-0 Vicryl subcutaneous tissue and a 5-0 Monocryl undyed for skin.  Attention was directed to the left fifth toe where 2 semielliptical incisions were made proximal lateral to distal dorsal.  Full-thickness incision was taken down to the capsule.  The  small ellipse was then removed.  The head of the proximal phalanx was then exposed.  This was removed with a power saw.  The wound was flushed with copious amounts of irrigation.  Good realignment was noted.  The extensor tendon was reapproximated with a 4-0 Vicryl.  The toe was placed in a derotated position.  Small dogears were removed from the proximal distal aspect of the incision.  The skin was then closed with a 4-0 nylon.    Patient tolerated the procedure and anesthesia well.  Was transported from the OR to the PACU with all vital signs stable and vascular status intact. To be discharged per routine protocol.  Will follow up in approximately 1 week in the outpatient clinic.

## 2019-02-09 NOTE — Anesthesia Postprocedure Evaluation (Signed)
Anesthesia Post Note  Patient: Shannon [name]  Procedure(s) Performed: HALLUX VALGUS AUSTIN ([place]) (Left Foot) HAMMER TOE CORRECTION (Left Toe)  Patient location during evaluation: PACU Anesthesia Type: General Level of consciousness: awake and alert and oriented Pain management: satisfactory to patient Vital Signs Assessment: post-procedure vital signs reviewed and stable Respiratory status: spontaneous breathing, nonlabored ventilation and respiratory function stable Cardiovascular status: blood pressure returned to baseline and stable Postop Assessment: Adequate PO intake and No signs of nausea or vomiting Anesthetic complications: no    Raliegh Ip

## 2019-02-09 NOTE — Anesthesia Preprocedure Evaluation (Signed)
Anesthesia Evaluation  Patient identified by MRN, date of birth, ID band Patient awake    Reviewed: Allergy & Precautions, H&P , NPO status , Patient's Chart, lab work & pertinent test results  Airway Mallampati: II  TM Distance: >3 FB Neck ROM: full    Dental no notable dental hx.    Pulmonary asthma , Current Smoker and Patient abstained from smoking.,    Pulmonary exam normal breath sounds clear to auscultation       Cardiovascular Normal cardiovascular exam Rhythm:regular Rate:Normal     Neuro/Psych    GI/Hepatic GERD  ,(+) Cirrhosis       , Hepatitis -, Autoimmune  Endo/Other  diabetes  Renal/GU      Musculoskeletal   Abdominal   Peds  Hematology   Anesthesia Other Findings   Reproductive/Obstetrics                             Anesthesia Physical Anesthesia Plan  ASA: III  Anesthesia Plan: General LMA   Post-op Pain Management:    Induction:   PONV Risk Score and Plan: 2 and Dexamethasone, Ondansetron and Treatment may vary due to age or medical condition  Airway Management Planned:   Additional Equipment:   Intra-op Plan:   Post-operative Plan:   Informed Consent: I have reviewed the patients History and Physical, chart, labs and discussed the procedure including the risks, benefits and alternatives for the proposed anesthesia with the patient or authorized representative who has indicated his/her understanding and acceptance.       Plan Discussed with: CRNA  Anesthesia Plan Comments:         Anesthesia Quick Evaluation

## 2019-02-10 NOTE — Telephone Encounter (Signed)
Pt stated she was not aware she needed to pick up orders to take to [facility]. Stated she just had surgery and is unable to come to the office to pick up anything. She would like to know if they can just be faxed over. Pt stated she was at [facility] yesterday and they were able to see some labs but not the urinalysis. Requesting CB. Please advise.

## 2019-02-10 NOTE — Telephone Encounter (Signed)
Fax lab orders  Call pt and get # to [place]

## 2019-02-11 NOTE — Telephone Encounter (Signed)
Lab orders faxed.

## 2019-02-14 DIAGNOSIS — M2012 Hallux valgus (acquired), left foot: Secondary | ICD-10-CM | POA: Diagnosis not present

## 2019-03-09 DIAGNOSIS — K219 Gastro-esophageal reflux disease without esophagitis: Secondary | ICD-10-CM | POA: Diagnosis not present

## 2019-03-09 DIAGNOSIS — K7469 Other cirrhosis of liver: Secondary | ICD-10-CM | POA: Diagnosis not present

## 2019-03-09 DIAGNOSIS — Z09 Encounter for follow-up examination after completed treatment for conditions other than malignant neoplasm: Secondary | ICD-10-CM | POA: Diagnosis not present

## 2019-03-09 DIAGNOSIS — Z885 Allergy status to narcotic agent status: Secondary | ICD-10-CM | POA: Diagnosis not present

## 2019-03-09 DIAGNOSIS — E119 Type 2 diabetes mellitus without complications: Secondary | ICD-10-CM | POA: Diagnosis not present

## 2019-03-09 DIAGNOSIS — K8020 Calculus of gallbladder without cholecystitis without obstruction: Secondary | ICD-10-CM | POA: Diagnosis not present

## 2019-03-09 DIAGNOSIS — K7460 Unspecified cirrhosis of liver: Secondary | ICD-10-CM | POA: Diagnosis not present

## 2019-03-09 DIAGNOSIS — Z7984 Long term (current) use of oral hypoglycemic drugs: Secondary | ICD-10-CM | POA: Diagnosis not present

## 2019-03-09 DIAGNOSIS — K862 Cyst of pancreas: Secondary | ICD-10-CM | POA: Diagnosis not present

## 2019-03-09 DIAGNOSIS — K766 Portal hypertension: Secondary | ICD-10-CM | POA: Diagnosis not present

## 2019-03-09 DIAGNOSIS — Z79899 Other long term (current) drug therapy: Secondary | ICD-10-CM | POA: Diagnosis not present

## 2019-03-09 DIAGNOSIS — D3502 Benign neoplasm of left adrenal gland: Secondary | ICD-10-CM | POA: Diagnosis not present

## 2019-03-09 DIAGNOSIS — K7290 Hepatic failure, unspecified without coma: Secondary | ICD-10-CM | POA: Diagnosis not present

## 2019-03-09 DIAGNOSIS — K754 Autoimmune hepatitis: Secondary | ICD-10-CM | POA: Diagnosis not present

## 2019-03-09 DIAGNOSIS — M329 Systemic lupus erythematosus, unspecified: Secondary | ICD-10-CM | POA: Diagnosis not present

## 2019-03-09 DIAGNOSIS — M8580 Other specified disorders of bone density and structure, unspecified site: Secondary | ICD-10-CM | POA: Diagnosis not present

## 2019-03-16 ENCOUNTER — Other Ambulatory Visit: Payer: Self-pay | Admitting: Internal Medicine

## 2019-03-16 DIAGNOSIS — E119 Type 2 diabetes mellitus without complications: Secondary | ICD-10-CM

## 2019-03-16 MED ORDER — METFORMIN HCL 500 MG PO TABS: ORAL_TABLET | ORAL | 3 refills | Status: DC

## 2019-03-21 DIAGNOSIS — M2012 Hallux valgus (acquired), left foot: Secondary | ICD-10-CM | POA: Diagnosis not present

## 2019-03-23 ENCOUNTER — Other Ambulatory Visit: Payer: Self-pay

## 2019-03-25 ENCOUNTER — Encounter: Payer: Self-pay | Admitting: Internal Medicine

## 2019-03-25 ENCOUNTER — Ambulatory Visit (INDEPENDENT_AMBULATORY_CARE_PROVIDER_SITE_OTHER): Payer: Medicare Other | Admitting: Internal Medicine

## 2019-03-25 ENCOUNTER — Other Ambulatory Visit (HOSPITAL_COMMUNITY)
Admission: RE | Admit: 2019-03-25 | Discharge: 2019-03-25 | Disposition: A | Discharge status: Home / Self Care | Payer: Medicare Other | Source: Ambulatory Visit | Attending: Internal Medicine | Admitting: Internal Medicine

## 2019-03-25 ENCOUNTER — Other Ambulatory Visit: Payer: Self-pay

## 2019-03-25 VITALS — BP 132/80 | HR 79 | Temp 97.3°F | Ht 66.0 in | Wt 201.6 lb

## 2019-03-25 DIAGNOSIS — Z0100 Encounter for examination of eyes and vision without abnormal findings: Secondary | ICD-10-CM

## 2019-03-25 DIAGNOSIS — Z79899 Other long term (current) drug therapy: Secondary | ICD-10-CM

## 2019-03-25 DIAGNOSIS — E119 Type 2 diabetes mellitus without complications: Secondary | ICD-10-CM | POA: Insufficient documentation

## 2019-03-25 DIAGNOSIS — Z1151 Encounter for screening for human papillomavirus (HPV): Secondary | ICD-10-CM | POA: Insufficient documentation

## 2019-03-25 DIAGNOSIS — N760 Acute vaginitis: Secondary | ICD-10-CM

## 2019-03-25 DIAGNOSIS — R350 Frequency of micturition: Secondary | ICD-10-CM

## 2019-03-25 DIAGNOSIS — E559 Vitamin D deficiency, unspecified: Secondary | ICD-10-CM

## 2019-03-25 DIAGNOSIS — Z90710 Acquired absence of both cervix and uterus: Secondary | ICD-10-CM | POA: Diagnosis not present

## 2019-03-25 DIAGNOSIS — Z124 Encounter for screening for malignant neoplasm of cervix: Secondary | ICD-10-CM

## 2019-03-25 DIAGNOSIS — Z794 Long term (current) use of insulin: Secondary | ICD-10-CM | POA: Insufficient documentation

## 2019-03-25 NOTE — Progress Notes (Addendum)
Chief Complaint  Patient presents with  . Annual Exam   Annual  DM 2 A1C due last 6.1 02/2018 and cholesterol due statin contraindicated due due chronic liver disease  Due to see [facility] missed appt  Also on plaquenil   C/o increased urinary frequency on and off x 2 months and will get better and worse constantly urinating Q1 hours at times and urgency and h/o hysterectomy   Left foot s/p surgery 03/21/2019 Dr. Vickki Muff will ask if can refill tramadol foot still hurting  She is c/w infection foot warm foot in nike foot swelling warmth   Review of Systems  Constitutional: Negative for weight loss.  HENT: Negative for hearing loss.   Eyes: Negative for blurred vision.  Respiratory: Negative for shortness of breath.   Cardiovascular: Negative for chest pain.  Gastrointestinal: Negative for abdominal pain.  Genitourinary: Positive for frequency and urgency.  Musculoskeletal: Negative for falls.  Skin: Negative for rash.  Neurological: Negative for headaches.  Psychiatric/Behavioral: Negative for depression.   Past Medical History:  Diagnosis Date  . Asthma   . Autoimmune hepatitis ([place])   . Depression   . Diabetes mellitus without complication ([place])   . DUB (dysfunctional uterine bleeding)    2008 s/p hysterectomy  . Exposure to TB 2005   tx'ed x 6 months  . Gallstones   . Gastric ulcer   . GERD (gastroesophageal reflux disease)   . History of chicken pox   . Hx of migraines   . Liver disease    very low liver function, auto-immune hepatitis  . Motion sickness   . Osteoarthritis   . Systemic lupus erythematosus ([place]) 05/31/2010   Qualifier: Diagnosis of  By: Deborra Medina MD, Tanja Port    . Vitamin D deficiency    Past Surgical History:  Procedure Laterality Date  . ABDOMINAL HYSTERECTOMY    . BACK SURGERY     L4/5 herniated disc repair 1992   . HALLUX VALGUS AUSTIN Left 02/09/2019   Procedure: HALLUX VALGUS AUSTIN ([place]);  Surgeon: [name], DPM;  Location: [facility];  Service: Podiatry;  Laterality: Left;  General with local  . HAMMER TOE SURGERY Left 02/09/2019   Procedure: HAMMER TOE CORRECTION;  Surgeon: [name], DPM;  Location: [facility];  Service: Podiatry;  Laterality: Left;  Diabetic - oral meds  . LAPAROSCOPIC HYSTERECTOMY    . TUBAL LIGATION     Family History  Problem Relation Age of Onset  . Cancer Mother        lung cancer smoker  . Heart disease Father   . Hypertension Father   . Hypertension Sister   . Sarcoidosis Sister        lung transplant failure  . Heart disease Brother   . Alcohol abuse Brother   . Diabetes Sister   . Diabetes Sister   . Kidney disease Sister   . Hypertension Sister   . Rheum arthritis Sister   . Diabetes Son   . Hypertension Son    Social History   Socioeconomic History  . Marital status: Legally Separated    Spouse name: Not on file  . Number of children: Not on file  . Years of education: Not on file  . Highest education level: Not on file  Occupational History  . Not on file  Social Needs  . Financial resource strain: Not hard at all  . Food insecurity    Worry: Never true    Inability: Never true  .  Transportation needs    Medical: No    Non-medical: No  Tobacco Use  . Smoking status: Current Every Day Smoker    Packs/day: 0.50    Years: 30.00    Pack years: 15.00    Types: Cigarettes  . Smokeless tobacco: Never Used  Substance and Sexual Activity  . Alcohol use: No  . Drug use: No  . Sexual activity: Not Currently  Lifestyle  . Physical activity    Days per week: 5 days    Minutes per session: 30 min  . Stress: Not at all  Relationships  . Social Herbalist on phone: Not on file    Gets together: Not on file    Attends religious service: Not on file    Active member of club or organization: Not on file    Attends meetings of clubs or organizations: Not on file    Relationship status: Not on file  . Intimate partner violence    Fear of  current or ex partner: Not on file    Emotionally abused: Not on file    Physically abused: Not on file    Forced sexual activity: Not on file  Other Topics Concern  . Not on file  Social History Narrative   4 pregnancies, 3 kids    Smoker    Technical sales engineer ed former LPN   Current Meds  Medication Sig  . albuterol (PROAIR HFA) 108 (90 Base) MCG/ACT inhaler Inhale 1-2 puffs into the lungs every 6 (six) hours as needed for wheezing or shortness of breath.  . azaTHIOprine (IMURAN) 50 MG tablet Take 100 mg by mouth 2 (two) times daily.   . Blood Glucose Monitoring Suppl (ONE TOUCH ULTRA SYSTEM KIT) W/DEVICE KIT Test blood sugar as directed Dx E09.9  . calcium-vitamin D (OSCAL WITH D) 500-200 MG-UNIT per tablet Take 1 tablet by mouth.  . Cholecalciferol (VITAMIN D3) 5000 units TABS Take by mouth.   . diclofenac sodium (VOLTAREN) 1 % GEL Apply topically 4 (four) times daily.  Marland Kitchen glucose blood test strip Use as instructed  . hydroxychloroquine (PLAQUENIL) 200 MG tablet Take 200 mg by mouth 2 (two) times daily.   Marland Kitchen ibuprofen (ADVIL) 800 MG tablet Take 1 tablet (800 mg total) by mouth every 8 (eight) hours as needed.  Marland Kitchen ibuprofen (ADVIL,MOTRIN) 800 MG tablet TAKE 1 TABLET (800 MG TOTAL) BY MOUTH DAILY.  Marland Kitchen lactulose (CHRONULAC) 10 GM/15ML solution TAKE 30 ML BY MOUTH 4 TIMES DAILY AS NEEDED (Patient taking differently: Take 30 g by mouth. 50-60 g 1x per day not 30 g qid)  . lidocaine (LIDODERM) 5 % Place 1 patch onto the skin daily. Remove & Discard patch within 12 hours or as directed by MD  . loratadine (CLARITIN) 10 MG tablet Take 10 mg by mouth every other day as needed for allergies.  . Magnesium Oxide 500 MG TABS Take by mouth.  . metFORMIN (GLUCOPHAGE) 500 MG tablet TAKE 1 TABLET (500 MG TOTAL) BY MOUTH DAILY WITH BREAKFAST.  Marland Kitchen omeprazole (PRILOSEC) 20 MG capsule TAKE 1 CAPSULE (20 MG TOTAL) BY MOUTH DAILY.  Marland Kitchen predniSONE (DELTASONE) 5 MG tablet TAKE 1/2 TABLET ONCE DAILY (Patient  taking differently: Take 5 mg by mouth daily with breakfast. )  . Secukinumab, 300 MG Dose, (COSENTYX SENSOREADY, 300 MG,) 150 MG/ML SOAJ Inject into the skin.  Marland Kitchen spironolactone (ALDACTONE) 50 MG tablet TAKE 1 TABLET BY MOUTH EVERY DAY (Patient taking differently: Take 50  mg by mouth. Biweekly)  . traMADol (ULTRAM) 50 MG tablet TAKE 1 TABLET BY MOUTH EVERY 8 HOURS AS NEEDED (Patient taking differently: Take 100 mg by mouth daily as needed. )  . traMADol (ULTRAM) 50 MG tablet Take 1 tablet (50 mg total) by mouth every 6 (six) hours as needed.  . vitamin B-12 (CYANOCOBALAMIN) 100 MCG tablet Take 100 mcg by mouth daily.   Allergies  Allergen Reactions  . Hydrocodone Other (See Comments)    Sweating, lethargy, dizzy  . Lipitor [Atorvastatin Calcium]     Myalgias, joint pain    . Milk-Related Compounds     GI upset  . Oxycodone Other (See Comments)    Sweating, lethargy  . Adhesive [Tape] Rash    Band aids   Recent Results (from the past 2160 hour(s))  SARS CORONAVIRUS 2 (TAT 6-24 HRS) Nasopharyngeal Nasopharyngeal Swab     Status: None   Collection Time: 02/04/19  3:32 PM   Specimen: Nasopharyngeal Swab  Result Value Ref Range   SARS Coronavirus 2 NEGATIVE NEGATIVE    Comment: (NOTE) SARS-CoV-2 target nucleic acids are NOT DETECTED. The SARS-CoV-2 RNA is generally detectable in upper and lower respiratory specimens during the acute phase of infection. Negative results do not preclude SARS-CoV-2 infection, do not rule out co-infections with other pathogens, and should not be used as the sole basis for treatment or other patient management decisions. Negative results must be combined with clinical observations, patient history, and epidemiological information. The expected result is Negative. Fact Sheet for Patients: SugarRoll.be Fact Sheet for Healthcare Providers: https://www.woods-mathews.com/ This test is not yet approved or cleared by  the [place] FDA and  has been authorized for detection and/or diagnosis of SARS-CoV-2 by FDA under an Emergency Use Authorization (EUA). This EUA will remain  in effect (meaning this test can be used) for the duration of the COVID-19 declaration under Section 56 4(b)(1) of the Act, 21 U.S.C. section 360bbb-3(b)(1), unless the authorization is terminated or revoked sooner. Performed at [facility], [place] [address], [place], [place] [address]   Glucose, capillary     Status: None   Collection Time: 02/09/19 11:26 AM  Result Value Ref Range   Glucose-Capillary 76 70 - 99 mg/dL   Objective  Body mass index is 32.54 kg/m. Wt Readings from Last 3 Encounters:  03/25/19 201 lb 9.6 oz (91.4 kg)  02/09/19 200 lb (90.7 kg)  12/23/18 194 lb (88 kg)   Temp Readings from Last 3 Encounters:  03/25/19 (!) 97.3 F (36.3 C) (Skin)  02/09/19 97.8 F (36.6 C)  12/23/18 98.3 F (36.8 C) (Oral)   BP Readings from Last 3 Encounters:  03/25/19 132/80  02/09/19 126/78  12/23/18 (!) 126/104   Pulse Readings from Last 3 Encounters:  03/25/19 79  02/09/19 80  12/23/18 79    Physical Exam Vitals signs and nursing note reviewed. Exam conducted with a chaperone present.  Constitutional:      Appearance: Normal appearance. She is well-developed and well-groomed. She is obese.     Comments: +mask on    HENT:     Head: Normocephalic and atraumatic.     Comments: B/l cerumen increased   Eyes:     Conjunctiva/sclera: Conjunctivae normal.     Pupils: Pupils are equal, round, and reactive to light.  Cardiovascular:     Rate and Rhythm: Normal rate and regular rhythm.     Heart sounds: Normal heart sounds. No murmur.  Pulmonary:  Effort: Pulmonary effort is normal.     Breath sounds: Normal breath sounds. No wheezing.  Abdominal:     General: Abdomen is flat. Bowel sounds are normal.     Tenderness: There is no abdominal tenderness.  Genitourinary:    Exam position: Supine.      Pubic Area: No rash or pubic lice.      Labia:        Right: No rash.        Left: No rash.      Cervix: Discharge present.     Uterus: Absent.      Adnexa: Right adnexa normal and left adnexa normal.     Comments: White thick discharge  No cervix   Musculoskeletal:     Comments: Left foot brace/boot    Skin:    General: Skin is warm and dry.  Neurological:     General: No focal deficit present.     Mental Status: She is alert and oriented to person, place, and time. Mental status is at baseline.     Gait: Gait normal.  Psychiatric:        Attention and Perception: Attention and perception normal.        Mood and Affect: Mood and affect normal.        Speech: Speech normal.        Behavior: Behavior normal. Behavior is cooperative.        Thought Content: Thought content normal.        Cognition and Memory: Cognition and memory normal.        Judgment: Judgment normal.     Assessment  Plan  Type 2 diabetes mellitus without complication, without long-term current use of insulin (HCC) - Plan: Lipid panel, TSH, HgB A1c, Microalbumin / creatinine urine ratio, Ambulatory referral to Ophthalmology Dr. Kathe Becton   Urine frequency/urgency r/o infection - Plan: Urinalysis, Routine w reflex microscopic, Urine Culture -refer to [facility] consider in future hold for now   Vitamin D deficiency - Plan: Vitamin D (25 hydroxy)  Acute vaginitis - Plan: Cytology - PAP( [facility])  Cervical cancer screening - Plan: Cervicovaginal ancillary only  Long-term use of Plaquenil - Plan: Ambulatory referral to Ophthalmology  Diabetic eye exam [facility]) - Plan: Ambulatory referral to Ophthalmology   HM Flu shot declines  Consider shingrix in future  prevnar had 04/24/14, consider pna 23 in future  Tdap had 11/04/10 Hep A/B immune  rech healthy diet and smoking cessation   Pap had 03/31/07 cant exclude HSIL s/p hysterectomy for DUB in 2008 given history rec pt to think about  it -pap today with discharge sent off   Mammogram pt declined on multiple occasions declined today 03/24/2019 and declined breast exam   DEXA 10/05/15 normal, 10/06/17 lumbar and femur neck osteopenia Vitamin D 22.8 07/13/17-rec D3 5000 IU qd  Colonoscopy 07/08/16 diverticulosis, polpys x 3 hyperplastic, IH [facility] q 10 years  EGD 10/30/17 mild portal hypertensive gastropathy in gastric body  HCV neg 03/19/10, hep B sAg neg 03/19/10  Consider HIV check in future will disc with pt   Smoking x 40 years 1 pk lasts 2 days max 1 ppd FH mom lung was smoker -rec cessation  [facility] saw 06/22/19 no toxic maculopathy, ocp normal f/u hydroxychloroquine yearly Dr. Edison Pace    Provider: Dr. Olivia Mackie McLean-Scocuzza-Internal Medicine

## 2019-03-25 NOTE — Patient Instructions (Signed)
Pelvic Organ Prolapse Pelvic organ prolapse is the stretching, bulging, or dropping of pelvic organs into an abnormal position. It happens when the muscles and tissues that surround and support pelvic structures become weak or stretched. Pelvic organ prolapse can involve the:  Vagina (vaginal prolapse).  Uterus (uterine prolapse).  Bladder (cystocele).  Rectum (rectocele).  Intestines (enterocele). When organs other than the vagina are involved, they often bulge into the vagina or protrude from the vagina, depending on how severe the prolapse is. What are the causes? This condition may be caused by:  Pregnancy, labor, and childbirth.  Past pelvic surgery.  Decreased production of the hormone estrogen associated with menopause.  Consistently lifting more than 50 lb (23 kg).  Obesity.  Long-term inability to pass stool (chronic constipation).  A cough that lasts a long time (chronic).  Buildup of fluid in the abdomen due to certain diseases and other conditions. What are the signs or symptoms? Symptoms of this condition include:  Passing a little urine (loss of bladder control) when you cough, sneeze, strain, and exercise (stress incontinence). This may be worse immediately after childbirth. It may gradually improve over time.  Feeling pressure in your pelvis or vagina. This pressure may increase when you cough or when you are passing stool.  A bulge that protrudes from the opening of your vagina.  Difficulty passing urine or stool.  Pain in your lower back.  Pain, discomfort, or disinterest in sex.  Repeated bladder infections (urinary tract infections).  Difficulty inserting a tampon. In some people, this condition causes no symptoms. How is this diagnosed? This condition may be diagnosed based on a vaginal and rectal exam. During the exam, you may be asked to cough and strain while you are lying down, sitting, and standing up. Your health care provider will  determine if other tests are required, such as bladder function tests. How is this treated? Treatment for this condition may depend on your symptoms. Treatment may include:  Lifestyle changes, such as changes to your diet.  Emptying your bladder at scheduled times (bladder training therapy). This can help reduce or avoid urinary incontinence.  Estrogen. Estrogen may help mild prolapse by increasing the strength and tone of pelvic floor muscles.  Kegel exercises. These may help mild cases of prolapse by strengthening and tightening the muscles of the pelvic floor.  A soft, flexible device that helps support the vaginal walls and keep pelvic organs in place (pessary). This is inserted into your vagina by your health care provider.  Surgery. This is often the only form of treatment for severe prolapse. Follow these instructions at home:  Avoid drinking beverages that contain caffeine or alcohol.  Increase your intake of high-fiber foods. This can help decrease constipation and straining during bowel movements.  Lose weight if recommended by your health care provider.  Wear a sanitary pad or adult diapers if you have urinary incontinence.  Avoid heavy lifting and straining with exercise and work. Do not hold your breath when you perform mild to moderate lifting and exercise activities. Limit your activities as directed by your health care provider.  Do Kegel exercises as directed by your health care provider. To do this: ? Squeeze your pelvic floor muscles tight. You should feel a tight lift in your rectal area and a tightness in your vaginal area. Keep your stomach, buttocks, and legs relaxed. ? Hold the muscles tight for up to 10 seconds. ? Relax your muscles. ? Repeat this exercise 50 times a day,  or as many times as told by your health care provider. Continue to do this exercise for at least 4-6 weeks, or for as long as told by your health care provider. °· Take over-the-counter and  prescription medicines only as told by your health care provider. °· If you have a pessary, take care of it as told by your health care provider. °· Keep all follow-up visits as told by your health care provider. This is important. °Contact a health care provider if you: °· Have symptoms that interfere with your daily activities or sex life. °· Need medicine to help with the discomfort. °· Notice bleeding from your vagina that is not related to your period. °· Have a fever. °· Have pain or bleeding when you urinate. °· Have bleeding when you pass stool. °· Pass urine when you have sex. °· Have chronic constipation. °· Have a pessary that falls out. °· Have bad smelling vaginal discharge. °· Have an unusual, low pain in your abdomen. °Summary °· Pelvic organ prolapse is the stretching, bulging, or dropping of pelvic organs into an abnormal position. It happens when the muscles and tissues that surround and support pelvic structures become weak or stretched. °· When organs other than the vagina are involved, they often bulge into the vagina or protrude from the vagina, depending on how severe the prolapse is. °· In most cases, this condition needs to be treated only if it produces symptoms. Treatment may include lifestyle changes, estrogen, Kegel exercises, pessary insertion, or surgery. °· Avoid heavy lifting and straining with exercise and work. Do not hold your breath when you perform mild to moderate lifting and exercise activities. Limit your activities as directed by your health care provider. °This information is not intended to replace advice given to you by your health care provider. Make sure you discuss any questions you have with your health care provider. °Document Released: 11/23/2013 Document Revised: 05/20/2017 Document Reviewed: 05/20/2017 °Elsevier Patient Education © 2020 [employer] ° °

## 2019-03-26 LAB — URINALYSIS, ROUTINE W REFLEX MICROSCOPIC
Bilirubin, UA: NEGATIVE
Glucose, UA: NEGATIVE
Ketones, UA: NEGATIVE
Leukocytes,UA: NEGATIVE
Nitrite, UA: NEGATIVE
Protein,UA: NEGATIVE
RBC, UA: NEGATIVE
Specific Gravity, UA: 1.012 (ref 1.005–1.030)
Urobilinogen, Ur: 0.2 mg/dL (ref 0.2–1.0)
pH, UA: 5.5 (ref 5.0–7.5)

## 2019-03-27 LAB — URINE CULTURE

## 2019-03-28 LAB — CERVICOVAGINAL ANCILLARY ONLY
Bacterial Vaginitis (gardnerella): NEGATIVE
Candida Glabrata: NEGATIVE
Candida Vaginitis: NEGATIVE
Comment: NEGATIVE
Comment: NEGATIVE
Comment: NEGATIVE
Comment: NEGATIVE
Trichomonas: NEGATIVE

## 2019-03-30 DIAGNOSIS — M2012 Hallux valgus (acquired), left foot: Secondary | ICD-10-CM | POA: Diagnosis not present

## 2019-03-30 LAB — CYTOLOGY - PAP
Adequacy: ABSENT
Comment: NEGATIVE
Diagnosis: NEGATIVE
High risk HPV: NEGATIVE

## 2019-04-28 DIAGNOSIS — E559 Vitamin D deficiency, unspecified: Secondary | ICD-10-CM | POA: Diagnosis not present

## 2019-04-28 DIAGNOSIS — E119 Type 2 diabetes mellitus without complications: Secondary | ICD-10-CM | POA: Diagnosis not present

## 2019-04-29 LAB — LIPID PANEL
Chol/HDL Ratio: 2.9 ratio (ref 0.0–4.4)
Cholesterol, Total: 193 mg/dL (ref 100–199)
HDL: 67 mg/dL (ref >39)
LDL Chol Calc (NIH): 114 mg/dL — ABNORMAL HIGH (ref 0–99)
Triglycerides: 66 mg/dL (ref 0–149)
VLDL Cholesterol Cal: 12 mg/dL (ref 5–40)

## 2019-04-29 LAB — MICROALBUMIN / CREATININE URINE RATIO
Creatinine, Urine: 109.9 mg/dL
Microalb/Creat Ratio: 6 mg/g creat (ref 0–29)
Microalbumin, Urine: 6.2 ug/mL

## 2019-04-29 LAB — TSH: TSH: 1.78 u[IU]/mL (ref 0.450–4.500)

## 2019-04-29 LAB — HEMOGLOBIN A1C
Est. average glucose Bld gHb Est-mCnc: 140 mg/dL
Hgb A1c MFr Bld: 6.5 % — ABNORMAL HIGH (ref 4.8–5.6)

## 2019-04-29 LAB — VITAMIN D 25 HYDROXY (VIT D DEFICIENCY, FRACTURES): Vit D, 25-Hydroxy: 36.3 ng/mL (ref 30.0–100.0)

## 2019-05-02 DIAGNOSIS — M2012 Hallux valgus (acquired), left foot: Secondary | ICD-10-CM | POA: Diagnosis not present

## 2019-05-04 ENCOUNTER — Telehealth: Payer: Self-pay | Admitting: Internal Medicine

## 2019-05-04 NOTE — Telephone Encounter (Signed)
Pt returned phone call for results of labs. Please call back @ [phone]

## 2019-05-16 DIAGNOSIS — M8580 Other specified disorders of bone density and structure, unspecified site: Secondary | ICD-10-CM | POA: Diagnosis not present

## 2019-05-16 DIAGNOSIS — M25511 Pain in right shoulder: Secondary | ICD-10-CM | POA: Diagnosis not present

## 2019-05-16 DIAGNOSIS — Z7952 Long term (current) use of systemic steroids: Secondary | ICD-10-CM | POA: Diagnosis not present

## 2019-05-16 DIAGNOSIS — K754 Autoimmune hepatitis: Secondary | ICD-10-CM | POA: Diagnosis not present

## 2019-05-16 DIAGNOSIS — M329 Systemic lupus erythematosus, unspecified: Secondary | ICD-10-CM | POA: Diagnosis not present

## 2019-05-16 DIAGNOSIS — M3219 Other organ or system involvement in systemic lupus erythematosus: Secondary | ICD-10-CM | POA: Diagnosis not present

## 2019-05-16 DIAGNOSIS — M25512 Pain in left shoulder: Secondary | ICD-10-CM | POA: Diagnosis not present

## 2019-05-16 DIAGNOSIS — Z7951 Long term (current) use of inhaled steroids: Secondary | ICD-10-CM | POA: Diagnosis not present

## 2019-05-16 DIAGNOSIS — Z7984 Long term (current) use of oral hypoglycemic drugs: Secondary | ICD-10-CM | POA: Diagnosis not present

## 2019-05-16 DIAGNOSIS — M160 Bilateral primary osteoarthritis of hip: Secondary | ICD-10-CM | POA: Diagnosis not present

## 2019-05-16 DIAGNOSIS — K7469 Other cirrhosis of liver: Secondary | ICD-10-CM | POA: Diagnosis not present

## 2019-05-16 DIAGNOSIS — Z23 Encounter for immunization: Secondary | ICD-10-CM | POA: Diagnosis not present

## 2019-05-16 DIAGNOSIS — M461 Sacroiliitis, not elsewhere classified: Secondary | ICD-10-CM | POA: Diagnosis not present

## 2019-05-23 ENCOUNTER — Emergency Department
Admission: EM | Admit: 2019-05-23 | Discharge: 2019-05-23 | Disposition: A | Discharge status: Home / Self Care | Payer: Medicare Other | Attending: Emergency Medicine | Admitting: Emergency Medicine

## 2019-05-23 ENCOUNTER — Emergency Department: Payer: Medicare Other

## 2019-05-23 ENCOUNTER — Other Ambulatory Visit: Payer: Self-pay

## 2019-05-23 DIAGNOSIS — Z79899 Other long term (current) drug therapy: Secondary | ICD-10-CM | POA: Diagnosis not present

## 2019-05-23 DIAGNOSIS — Z20822 Contact with and (suspected) exposure to covid-19: Secondary | ICD-10-CM | POA: Diagnosis not present

## 2019-05-23 DIAGNOSIS — B9789 Other viral agents as the cause of diseases classified elsewhere: Secondary | ICD-10-CM | POA: Diagnosis not present

## 2019-05-23 DIAGNOSIS — J45909 Unspecified asthma, uncomplicated: Secondary | ICD-10-CM | POA: Diagnosis not present

## 2019-05-23 DIAGNOSIS — E119 Type 2 diabetes mellitus without complications: Secondary | ICD-10-CM | POA: Diagnosis not present

## 2019-05-23 DIAGNOSIS — F17210 Nicotine dependence, cigarettes, uncomplicated: Secondary | ICD-10-CM | POA: Insufficient documentation

## 2019-05-23 DIAGNOSIS — J069 Acute upper respiratory infection, unspecified: Secondary | ICD-10-CM

## 2019-05-23 DIAGNOSIS — R05 Cough: Secondary | ICD-10-CM | POA: Diagnosis not present

## 2019-05-23 LAB — URINALYSIS, COMPLETE (UACMP) WITH MICROSCOPIC
Bacteria, UA: NONE SEEN
Bilirubin Urine: NEGATIVE
Glucose, UA: NEGATIVE mg/dL
Hgb urine dipstick: NEGATIVE
Ketones, ur: NEGATIVE mg/dL
Leukocytes,Ua: NEGATIVE
Nitrite: NEGATIVE
Protein, ur: NEGATIVE mg/dL
Specific Gravity, Urine: 1.02 (ref 1.005–1.030)
pH: 6 (ref 5.0–8.0)

## 2019-05-23 MED ORDER — IBUPROFEN 800 MG PO TABS: 800.0000 mg | ORAL_TABLET | Freq: Three times a day (TID) | ORAL | 0 refills | Status: DC | PRN

## 2019-05-23 MED ORDER — PSEUDOEPH-BROMPHEN-DM 30-2-10 MG/5ML PO SYRP: 5.0000 mL | ORAL_SOLUTION | Freq: Four times a day (QID) | ORAL | 0 refills | Status: DC | PRN

## 2019-05-23 NOTE — ED Triage Notes (Signed)
Pt c/o cough with congestion for the past couple days, states she was exposed to someone that was covid + last week

## 2019-05-23 NOTE — ED Notes (Signed)
See triage note  Presents with sore throat,dry cough and nasal congestion which started after being exposed to [place] she also noticed decreased taste  States she usually runs a 97.5 temp but noticed that her temperature was 1 degree higher

## 2019-05-23 NOTE — ED Provider Notes (Signed)
[facility] Emergency Department Provider Note   ____________________________________________   None    (approximate)  I have reviewed the triage vital signs and the nursing notes.   HISTORY  Chief Complaint URI    HPI Shannon [name] is a 64 y.o. female patient complain of cough and congestion for the past few days.  States there is nasal and chest congestion.  Patient state positive exposure COVID-19 last week from coworker.  Patient states sore throat and loss of taste.  No recent travel.  Patient also complain of bilateral flank pain.  States intermittent fever.  Cough which is intermittent productive and nonproductive.  No nausea, vomiting, diarrhea.         Past Medical History:  Diagnosis Date  . Asthma   . Autoimmune hepatitis ([place])   . Depression   . Diabetes mellitus without complication ([place])   . DUB (dysfunctional uterine bleeding)    2008 s/p hysterectomy  . Exposure to TB 2005   tx'ed x 6 months  . Gallstones   . Gastric ulcer   . GERD (gastroesophageal reflux disease)   . History of chicken pox   . Hx of migraines   . Liver disease    very low liver function, auto-immune hepatitis  . Motion sickness   . Osteoarthritis   . Systemic lupus erythematosus ([place]) 05/31/2010   Qualifier: Diagnosis of  By: Deborra Medina MD, Tanja Port    . Vitamin D deficiency     Patient Active Problem List   Diagnosis Date Noted  . Type 2 diabetes mellitus without complication, without long-term current use of insulin ([place]) 03/25/2019  . Gallstones 10/05/2018  . Mild intermittent asthma with exacerbation 04/07/2018  . History of colon polyps 03/03/2018  . Cirrhosis of liver ([place]) 02/26/2018  . Sacroiliac joint pain 02/26/2018  . Arthritis 02/26/2018  . Chronic pain 02/26/2018  . History of asthma 02/26/2018  . Portal hypertensive gastropathy ([place]) 01/23/2015  . Fatigue 01/23/2015  . HLD (hyperlipidemia) 11/23/2013  . Tobacco abuse 11/23/2013  .  Fibromyalgia 08/16/2013  . Abdominal bloating 08/16/2013  . Lumbosacral radiculopathy at L5 03/26/2012  . OA (osteoarthritis) of knee 03/26/2012  . Candidiasis 10/08/2011  . Hyperpigmentation 11/04/2010  . Adjustment disorder 11/04/2010  . Steroid-induced diabetes ([place]) 05/31/2010  . Vitamin D deficiency 05/31/2010  . Autoimmune hepatitis ([place]) 05/31/2010  . Systemic lupus erythematosus ([place]) 05/31/2010    Past Surgical History:  Procedure Laterality Date  . ABDOMINAL HYSTERECTOMY    . BACK SURGERY     L4/5 herniated disc repair 1992   . HALLUX VALGUS AUSTIN Left 02/09/2019   Procedure: HALLUX VALGUS AUSTIN ([place]);  Surgeon: [name], DPM;  Location: [facility];  Service: Podiatry;  Laterality: Left;  General with local  . HAMMER TOE SURGERY Left 02/09/2019   Procedure: HAMMER TOE CORRECTION;  Surgeon: [name], DPM;  Location: [facility];  Service: Podiatry;  Laterality: Left;  Diabetic - oral meds  . LAPAROSCOPIC HYSTERECTOMY    . TUBAL LIGATION      Prior to Admission medications   Medication Sig Start Date End Date Taking? Authorizing Provider  albuterol (PROAIR HFA) 108 (90 Base) MCG/ACT inhaler Inhale 1-2 puffs into the lungs every 6 (six) hours as needed for wheezing or shortness of breath. 04/07/18   McLean-Scocuzza, Nino Glow, MD  azaTHIOprine (IMURAN) 50 MG tablet Take 100 mg by mouth 2 (two) times daily.     [provider]  Blood Glucose Monitoring Suppl (  ONE TOUCH ULTRA SYSTEM KIT) W/DEVICE KIT Test blood sugar as directed Dx E09.9 04/28/14   Lucille Passy, MD  brompheniramine-pseudoephedrine-DM 30-2-10 MG/5ML syrup Take 5 mLs by mouth 4 (four) times daily as needed. 05/23/19   Sable Feil, PA-C  calcium-vitamin D (OSCAL WITH D) 500-200 MG-UNIT per tablet Take 1 tablet by mouth.    [provider]  Cholecalciferol (VITAMIN D3) 5000 units TABS Take by mouth.     [provider]  diclofenac sodium (VOLTAREN) 1 %  GEL Apply topically 4 (four) times daily.    [provider]  glucose blood test strip Use as instructed 04/24/14   Lucille Passy, MD  hydroxychloroquine (PLAQUENIL) 200 MG tablet Take 200 mg by mouth 2 (two) times daily.  08/02/15   [provider]  ibuprofen (ADVIL) 800 MG tablet Take 1 tablet (800 mg total) by mouth every 8 (eight) hours as needed. 02/09/19   [name], DPM  ibuprofen (ADVIL) 800 MG tablet Take 1 tablet (800 mg total) by mouth every 8 (eight) hours as needed for moderate pain. 05/23/19   Sable Feil, PA-C  ibuprofen (ADVIL,MOTRIN) 800 MG tablet TAKE 1 TABLET (800 MG TOTAL) BY MOUTH DAILY. 01/31/15   Lucille Passy, MD  lactulose (CHRONULAC) 10 GM/15ML solution TAKE 30 ML BY MOUTH 4 TIMES DAILY AS NEEDED Patient taking differently: Take 30 g by mouth. 50-60 g 1x per day not 30 g qid 09/26/14   Lucille Passy, MD  lidocaine (LIDODERM) 5 % Place 1 patch onto the skin daily. Remove & Discard patch within 12 hours or as directed by MD 09/22/12   Owens Loffler, MD  loratadine (CLARITIN) 10 MG tablet Take 10 mg by mouth every other day as needed for allergies.    [provider]  Magnesium Oxide 500 MG TABS Take by mouth.    [provider]  metFORMIN (GLUCOPHAGE) 500 MG tablet TAKE 1 TABLET (500 MG TOTAL) BY MOUTH DAILY WITH BREAKFAST. 03/16/19   McLean-Scocuzza, Nino Glow, MD  omeprazole (PRILOSEC) 20 MG capsule TAKE 1 CAPSULE (20 MG TOTAL) BY MOUTH DAILY. 01/15/16   Lucille Passy, MD  predniSONE (DELTASONE) 5 MG tablet TAKE 1/2 TABLET ONCE DAILY Patient taking differently: Take 5 mg by mouth daily with breakfast.  08/20/15   Lucille Passy, MD  Secukinumab, 300 MG Dose, (COSENTYX SENSOREADY, 300 MG,) 150 MG/ML SOAJ Inject into the skin. 08/25/18   [provider]  spironolactone (ALDACTONE) 50 MG tablet TAKE 1 TABLET BY MOUTH EVERY DAY Patient taking differently: Take 50 mg by mouth. Biweekly 03/06/15   Lucille Passy, MD  traMADol (ULTRAM) 50  MG tablet TAKE 1 TABLET BY MOUTH EVERY 8 HOURS AS NEEDED Patient taking differently: Take 100 mg by mouth daily as needed.  11/18/13   Lucille Passy, MD  traMADol (ULTRAM) 50 MG tablet Take 1 tablet (50 mg total) by mouth every 6 (six) hours as needed. 02/09/19 02/09/20  [name], DPM  vitamin B-12 (CYANOCOBALAMIN) 100 MCG tablet Take 100 mcg by mouth daily.    [provider]    Allergies Hydrocodone, Lipitor [atorvastatin calcium], Milk-related compounds, Oxycodone, and Adhesive [tape]  Family History  Problem Relation Age of Onset  . Cancer Mother        lung cancer smoker  . Heart disease Father   . Hypertension Father   . Hypertension Sister   . Sarcoidosis Sister        lung transplant  failure  . Heart disease Brother   . Alcohol abuse Brother   . Diabetes Sister   . Diabetes Sister   . Kidney disease Sister   . Hypertension Sister   . Rheum arthritis Sister   . Diabetes Son   . Hypertension Son     Social History Social History   Tobacco Use  . Smoking status: Current Every Day Smoker    Packs/day: 0.50    Years: 30.00    Pack years: 15.00    Types: Cigarettes  . Smokeless tobacco: Never Used  Substance Use Topics  . Alcohol use: No  . Drug use: No    Review of Systems Constitutional: No fever/chills Eyes: No visual changes. ENT: No sore throat. Cardiovascular: Denies chest pain. Respiratory: Denies shortness of breath. Gastrointestinal: No abdominal pain.  No nausea, no vomiting.  No diarrhea.  No constipation. Genitourinary: Negative for dysuria. Musculoskeletal: Negative for back pain. Skin: Negative for rash. Neurological: Negative for headaches, focal weakness or numbness. Endocrine:  Diabetes, hepatitis, and lupus Allergic/Immunilogical: Hydrocodone, Lipitor, milk related products, oxycodone,and adhesive tape. ____________________________________________   PHYSICAL EXAM:  VITAL SIGNS: ED Triage Vitals  Enc Vitals Group     BP  05/23/19 0902 133/88     Pulse Rate 05/23/19 0902 80     Resp 05/23/19 0902 18     Temp 05/23/19 0903 98.1 F (36.7 C)     Temp Source 05/23/19 0902 Oral     SpO2 05/23/19 0902 99 %     Weight 05/23/19 0903 200 lb (90.7 kg)     Height 05/23/19 0903 5' 6"  (1.676 m)     Head Circumference --      Peak Flow --      Pain Score 05/23/19 0902 7     Pain Loc --      Pain Edu? --      Excl. in [place]? --    Constitutional: Alert and oriented. Well appearing and in no acute distress. Eyes: Conjunctivae are normal. PERRL. EOMI. Head: Atraumatic. Nose: Edematous nasal turbinates clear rhinorrhea. Mouth/Throat: Mucous membranes are moist.  Oropharynx non-erythematous.  Postnasal drainage. Neck: No stridor.   Hematological/Lymphatic/Immunilogical: No cervical lymphadenopathy. Cardiovascular: Normal rate, regular rhythm. Grossly normal heart sounds.  Good peripheral circulation. Respiratory: Normal respiratory effort.  No retractions. Lungs CTAB. Gastrointestinal: Soft and nontender. No distention. No abdominal bruits. No CVA tenderness. Skin:  Skin is warm, dry and intact. No rash noted. Psychiatric: Mood and affect are normal. Speech and behavior are normal.  ____________________________________________   LABS (all labs ordered are listed, but only abnormal results are displayed)  Labs Reviewed  URINALYSIS, COMPLETE (UACMP) WITH MICROSCOPIC - Abnormal; Notable for the following components:      Result Value   Color, Urine YELLOW (*)    APPearance CLEAR (*)    All other components within normal limits  SARS CORONAVIRUS 2 (TAT 6-24 HRS)   ____________________________________________  EKG   ____________________________________________  RADIOLOGY  ED MD interpretation:    Official radiology report(s): DG Chest Portable 1 View  Result Date: 05/23/2019 CLINICAL DATA:  Cough and chest congestion, exposure to COVID-19 EXAM: PORTABLE CHEST 1 VIEW COMPARISON:  2015 FINDINGS: The heart  size and mediastinal contours are within normal limits. Both lungs are clear. No pleural effusion. The visualized skeletal structures are unremarkable. IMPRESSION: No acute process in the chest. Electronically Signed   By: Macy Mis M.D.   On: 05/23/2019 10:00    ____________________________________________   PROCEDURES  Procedure(s) performed (including Critical Care):  Procedures   ____________________________________________   INITIAL IMPRESSION / ASSESSMENT AND PLAN / ED COURSE  As part of my medical decision making, I reviewed the following data within the [facility]     Patient presents with several days of nasal and chest congestion.  Patient also has a nonproductive cough.  Patient she is exposed to COVID-19 last week.  Discussed negative chest x-ray findings with patient.  Patient physical exam is consistent with viral respiratory infection with cough.  Patient given discharge care instruction advised take medication as directed.  Patient advised self quarantine pending results of COVID-19 test.    Burman Blacksmith was evaluated in Emergency Department on 05/23/2019 for the symptoms described in the history of present illness. She was evaluated in the context of the global COVID-19 pandemic, which necessitated consideration that the patient might be at risk for infection with the SARS-CoV-2 virus that causes COVID-19. Institutional protocols and algorithms that pertain to the evaluation of patients at risk for COVID-19 are in a state of rapid change based on information released by regulatory bodies including the CDC and federal and state organizations. These policies and algorithms were followed during the patient's care in the ED.       ____________________________________________   FINAL CLINICAL IMPRESSION(S) / ED DIAGNOSES  Final diagnoses:  Viral URI with cough     ED Discharge Orders         Ordered    brompheniramine-pseudoephedrine-DM  30-2-10 MG/5ML syrup  4 times daily PRN     05/23/19 1057    ibuprofen (ADVIL) 800 MG tablet  Every 8 hours PRN     05/23/19 1057           Note:  This document was prepared using Dragon voice recognition software and may include unintentional dictation errors.    Sable Feil, PA-C 05/23/19 1059    Earleen Newport, MD 05/23/19 1423

## 2019-05-23 NOTE — Discharge Instructions (Addendum)
Follow discharge care instruction take medication as directed.  Advised self quarantine pending results of COVID-19 test.  If test is positive you need to quarantine additional 10 days.

## 2019-05-24 LAB — SARS CORONAVIRUS 2 (TAT 6-24 HRS): SARS Coronavirus 2: NEGATIVE

## 2019-06-14 DIAGNOSIS — M329 Systemic lupus erythematosus, unspecified: Secondary | ICD-10-CM | POA: Diagnosis not present

## 2019-06-14 DIAGNOSIS — Z79899 Other long term (current) drug therapy: Secondary | ICD-10-CM | POA: Diagnosis not present

## 2019-06-15 DIAGNOSIS — M2012 Hallux valgus (acquired), left foot: Secondary | ICD-10-CM | POA: Diagnosis not present

## 2019-06-15 DIAGNOSIS — M79672 Pain in left foot: Secondary | ICD-10-CM | POA: Diagnosis not present

## 2019-06-15 DIAGNOSIS — M205X2 Other deformities of toe(s) (acquired), left foot: Secondary | ICD-10-CM | POA: Diagnosis not present

## 2019-06-20 LAB — HM DIABETES EYE EXAM

## 2019-06-21 DIAGNOSIS — Z780 Asymptomatic menopausal state: Secondary | ICD-10-CM | POA: Diagnosis not present

## 2019-06-21 DIAGNOSIS — Z7952 Long term (current) use of systemic steroids: Secondary | ICD-10-CM | POA: Diagnosis not present

## 2019-06-21 DIAGNOSIS — M8589 Other specified disorders of bone density and structure, multiple sites: Secondary | ICD-10-CM | POA: Diagnosis not present

## 2019-06-22 ENCOUNTER — Telehealth: Payer: Self-pay | Admitting: Internal Medicine

## 2019-06-22 NOTE — Telephone Encounter (Signed)
error 

## 2019-06-27 ENCOUNTER — Encounter: Payer: Self-pay | Admitting: Internal Medicine

## 2019-09-01 ENCOUNTER — Ambulatory Visit (INDEPENDENT_AMBULATORY_CARE_PROVIDER_SITE_OTHER): Payer: Medicare Other

## 2019-09-01 VITALS — Ht 66.0 in | Wt 200.0 lb

## 2019-09-01 DIAGNOSIS — Z0000 Encounter for general adult medical examination without abnormal findings: Secondary | ICD-10-CM

## 2019-09-01 NOTE — Patient Instructions (Addendum)
  Shannon [name] , Thank you for taking time to come for your Medicare Wellness Visit. I appreciate your ongoing commitment to your health goals. Please review the following plan we discussed and let me know if I can assist you in the future.   These are the goals we discussed: Goals      Patient Stated   . Client plans to start chantix and stop smoking (pt-stated)     Patient states she smokes less       This is a list of the screening recommended for you and due dates:  Health Maintenance  Topic Date Due  . Pneumococcal vaccine  Never done  . HIV Screening  Never done  . COVID-19 Vaccine (2 - Moderna 2-dose series) 08/05/2019  . Mammogram  10/05/2019*  . Hemoglobin A1C  10/27/2019  . Flu Shot  12/11/2019  . Urine Protein Check  04/27/2020  . Complete foot exam   05/01/2020  . Eye exam for diabetics  06/19/2020  . Tetanus Vaccine  11/03/2020  . Pap Smear  03/24/2022  . Colon Cancer Screening  07/08/2026  .  Hepatitis C: One time screening is recommended by Center for Disease Control  (CDC) for  adults born from [birth year] through 1963.   Completed  *Topic was postponed. The date shown is not the original due date.

## 2019-09-01 NOTE — Progress Notes (Addendum)
Subjective:   Shannon [name] is a 64 y.o. female who presents for Medicare Annual (Subsequent) preventive examination.  Review of Systems:  No ROS.  Medicare Wellness Virtual Visit.  Visual/audio telehealth visit, UTA vital signs.   Ht/Wt provided.  See social history for additional risk factors.   Cardiac Risk Factors include: diabetes mellitus     Objective:     Vitals: Ht _0  (1.676 m)   Wt 200 lb (90.7 kg)   BMI 32.28 kg/m   Body mass index is 32.28 kg/m.  Advanced Directives 09/01/2019 05/23/2019 02/09/2019 08/31/2018 07/18/2017  Does Patient Have a Medical Advance Directive? _1   Would patient like information on creating a medical advance directive? No - Patient declined No - Patient declined No - Patient declined No - Patient declined -    Tobacco Social History   Tobacco Use  Smoking Status Current Every Day Smoker  . Packs/day: 0.50  . Years: 30.00  . Pack years: 15.00  . Types: Cigarettes  Smokeless Tobacco Never Used     Ready to quit: Not Answered Counseling given: Not Answered   Clinical Intake:  Pre-visit preparation completed: Yes        Diabetes: Yes(Followed by pcp)  How often do you need to have someone help you when you read instructions, pamphlets, or other written materials from your doctor or pharmacy?: 1 - Never  Interpreter Needed?: No     Past Medical History:  Diagnosis Date  . Asthma   . Autoimmune hepatitis ([place])   . Depression   . Diabetes mellitus without complication ([place])   . DUB (dysfunctional uterine bleeding)    2008 s/p hysterectomy  . Exposure to TB 2005   tx'ed x 6 months  . Gallstones   . Gastric ulcer   . GERD (gastroesophageal reflux disease)   . History of chicken pox   . Hx of migraines   . Liver disease    very low liver function, auto-immune hepatitis  . Motion sickness   . Osteoarthritis   . Systemic lupus erythematosus ([place]) 05/31/2010   Qualifier: Diagnosis of  By: Deborra Medina MD,  Tanja Port    . Vitamin D deficiency    Past Surgical History:  Procedure Laterality Date  . ABDOMINAL HYSTERECTOMY    . BACK SURGERY     L4/5 herniated disc repair 1992   . HALLUX VALGUS AUSTIN Left 02/09/2019   Procedure: HALLUX VALGUS AUSTIN ([place]CHELL);  Surgeon: [name], DPM;  Location: [facility];  Service: Podiatry;  Laterality: Left;  General with local  . HAMMER TOE SURGERY Left 02/09/2019   Procedure: HAMMER TOE CORRECTION;  Surgeon: [name], DPM;  Location: [facility];  Service: Podiatry;  Laterality: Left;  Diabetic - oral meds  . LAPAROSCOPIC HYSTERECTOMY    . TUBAL LIGATION     Family History  Problem Relation Age of Onset  . Cancer Mother        lung cancer smoker  . Heart disease Father   . Hypertension Father   . Hypertension Sister   . Sarcoidosis Sister        lung transplant failure  . Heart disease Brother   . Alcohol abuse Brother   . Diabetes Sister   . Diabetes Sister   . Kidney disease Sister   . Hypertension Sister   . Rheum arthritis Sister   . Diabetes Son   . Hypertension Son    Social History   Socioeconomic History  .  Marital status: Legally Separated    Spouse name: Not on file  . Number of children: Not on file  . Years of education: Not on file  . Highest education level: Not on file  Occupational History  . Not on file  Tobacco Use  . Smoking status: Current Every Day Smoker    Packs/day: 0.50    Years: 30.00    Pack years: 15.00    Types: Cigarettes  . Smokeless tobacco: Never Used  Substance and Sexual Activity  . Alcohol use: No  . Drug use: No  . Sexual activity: Not Currently  Other Topics Concern  . Not on file  Social History Narrative   4 pregnancies, 3 kids    Smoker    Technical sales engineer ed former LPN   Social Determinants of Health   Financial Resource Strain:   . Difficulty of Paying Living Expenses:   Food Insecurity:   . Worried About Charity fundraiser in the Last Year:     . Arboriculturist in the Last Year:   Transportation Needs:   . Film/video editor (Medical):   Marland Kitchen Lack of Transportation (Non-Medical):   Physical Activity:   . Days of Exercise per Week:   . Minutes of Exercise per Session:   Stress:   . Feeling of Stress :   Social Connections:   . Frequency of Communication with Friends and Family:   . Frequency of Social Gatherings with Friends and Family:   . Attends Religious Services:   . Active Member of Clubs or Organizations:   . Attends Archivist Meetings:   Marland Kitchen Marital Status:     Outpatient Encounter Medications as of 09/01/2019  Medication Sig  . albuterol (PROAIR HFA) 108 (90 Base) MCG/ACT inhaler Inhale 1-2 puffs into the lungs every 6 (six) hours as needed for wheezing or shortness of breath.  . azaTHIOprine (IMURAN) 50 MG tablet Take 100 mg by mouth 2 (two) times daily.   . Blood Glucose Monitoring Suppl (ONE TOUCH ULTRA SYSTEM KIT) W/DEVICE KIT Test blood sugar as directed Dx E09.9  . brompheniramine-pseudoephedrine-DM 30-2-10 MG/5ML syrup Take 5 mLs by mouth 4 (four) times daily as needed.  . calcium-vitamin D (OSCAL WITH D) 500-200 MG-UNIT per tablet Take 1 tablet by mouth.  . Cholecalciferol (VITAMIN D3) 5000 units TABS Take by mouth.   . diclofenac sodium (VOLTAREN) 1 % GEL Apply topically 4 (four) times daily.  Marland Kitchen glucose blood test strip Use as instructed  . hydroxychloroquine (PLAQUENIL) 200 MG tablet Take 200 mg by mouth 2 (two) times daily.   Marland Kitchen ibuprofen (ADVIL) 800 MG tablet Take 1 tablet (800 mg total) by mouth every 8 (eight) hours as needed.  Marland Kitchen ibuprofen (ADVIL) 800 MG tablet Take 1 tablet (800 mg total) by mouth every 8 (eight) hours as needed for moderate pain.  Marland Kitchen ibuprofen (ADVIL,MOTRIN) 800 MG tablet TAKE 1 TABLET (800 MG TOTAL) BY MOUTH DAILY.  Marland Kitchen lactulose (CHRONULAC) 10 GM/15ML solution TAKE 30 ML BY MOUTH 4 TIMES DAILY AS NEEDED (Patient taking differently: Take 30 g by mouth. 50-60 g 1x per day not  30 g qid)  . lidocaine (LIDODERM) 5 % Place 1 patch onto the skin daily. Remove & Discard patch within 12 hours or as directed by MD  . loratadine (CLARITIN) 10 MG tablet Take 10 mg by mouth every other day as needed for allergies.  . Magnesium Oxide 500 MG TABS Take by mouth.  Marland Kitchen  metFORMIN (GLUCOPHAGE) 500 MG tablet TAKE 1 TABLET (500 MG TOTAL) BY MOUTH DAILY WITH BREAKFAST.  Marland Kitchen omeprazole (PRILOSEC) 20 MG capsule TAKE 1 CAPSULE (20 MG TOTAL) BY MOUTH DAILY.  Marland Kitchen predniSONE (DELTASONE) 5 MG tablet TAKE 1/2 TABLET ONCE DAILY (Patient taking differently: Take 5 mg by mouth daily with breakfast. )  . Secukinumab, 300 MG Dose, (COSENTYX SENSOREADY, 300 MG,) 150 MG/ML SOAJ Inject into the skin.  Marland Kitchen spironolactone (ALDACTONE) 50 MG tablet TAKE 1 TABLET BY MOUTH EVERY DAY (Patient taking differently: Take 50 mg by mouth. Biweekly)  . traMADol (ULTRAM) 50 MG tablet TAKE 1 TABLET BY MOUTH EVERY 8 HOURS AS NEEDED (Patient taking differently: Take 100 mg by mouth daily as needed. )  . traMADol (ULTRAM) 50 MG tablet Take 1 tablet (50 mg total) by mouth every 6 (six) hours as needed.  . vitamin B-12 (CYANOCOBALAMIN) 100 MCG tablet Take 100 mcg by mouth daily.   No facility-administered encounter medications on file as of 09/01/2019.    Activities of Daily Living In your present state of health, do you have any difficulty performing the following activities: 09/01/2019 02/09/2019  Hearing? N N  Vision? N N  Difficulty concentrating or making decisions? N N  Walking or climbing stairs? Y N  Comment Paces self -  Dressing or bathing? N N  Doing errands, shopping? N -  Preparing Food and eating ? N -  Using the Toilet? N -  In the past six months, have you accidently leaked urine? N -  Do you have problems with loss of bowel control? N -  Managing your Medications? N -  Managing your Finances? N -  Housekeeping or managing your Housekeeping? N -  Some recent data might be hidden    Patient Care  Team: McLean-Scocuzza, Nino Glow, MD as PCP - General (Internal Medicine)    Assessment:   This is a routine wellness examination for Floree.  Nurse connected with patient 09/01/19 at 10:00 AM EDT by a telephone enabled telemedicine application and verified that I am speaking with the correct person using two identifiers. Patient stated full name and DOB. Patient gave permission to continue with virtual visit. Patient's location was at home and Nurse's location was at [place] office.   Patient is alert and oriented x3. Patient denies difficulty focusing or concentrating. Patient likes to encourage others, complete puzzles and brain teasers for brain health.   Health Maintenance Due: -PNA vaccine- discussed; to be completed with doctor in visit or local pharmacy.  -Covid vaccine- Moderna 1st dose received 07/08/19. Declined second dose due to negative reaction. -Hgb A1c- 04/28/19 (6.5) See completed HM at the end of note.   Eye: Visual acuity not assessed. Virtual visit. Followed by their ophthalmologist. Retinopathy- none reported.  Dental: UTD  Hearing: Demonstrates normal hearing during visit.  Safety:  Patient feels safe at home- yes Patient does have smoke detectors at home- yes Patient does wear sunscreen or protective clothing when in direct sunlight - yes Patient does wear seat belt when in a moving vehicle - yes Patient drives- yes Adequate lighting in walkways free from debris- yes Grab bars and handrails used as appropriate- yes Ambulates with an assistive device- yes; cane as needed Cell phone on person when ambulating outside of the home- yes  Social: Alcohol intake - no    Smoking history- current Illicit drug use? none  Medication: Taking as directed and without issues.  Pill box in use -yes  Self managed -  yes   Covid-19: Precautions and sickness symptoms discussed. Wears mask, social distancing, hand hygiene as appropriate.   Activities of Daily  Living Patient denies needing assistance with: household chores, feeding themselves, getting from bed to chair, getting to the toilet, bathing/showering, dressing, managing money, or preparing meals. She paces herself.   Discussed the importance of a healthy diet, water intake and the benefits of aerobic exercise.   Physical activity- no routine; limited. Paces herself with daily activity.   Diet:  Regular Water: good intake Caffeine: 1-2 cups coffee  Other Providers Patient Care Team: McLean-Scocuzza, Nino Glow, MD as PCP - General (Internal Medicine)  Exercise Activities and Dietary recommendations Current Exercise Habits: The patient does not participate in regular exercise at present  Goals      Patient Stated   . Client plans to start chantix and stop smoking (pt-stated)     Patient states she smokes less       Fall Risk Fall Risk  09/01/2019 03/25/2019 10/05/2018 08/31/2018 04/07/2018  Falls in the past year? 0 0 1 1 0  Number falls in past yr: - - 0 0 -  Injury with Fall? - - 0 0 -  Risk for fall due to : - - - Impaired balance/gait -  Follow up Falls evaluation completed - - - -   Timed Get Up and Go performed: no, virtual visit  Depression Screen PHQ 2/9 Scores 09/01/2019 03/25/2019 10/05/2018 08/31/2018  PHQ - 2 Score 0 0 0 0  PHQ- 9 Score - - - -     Cognitive Function     6CIT Screen 09/01/2019 08/31/2018  What Year? 0 points 0 points  What month? 0 points 0 points  What time? 0 points 0 points  Count back from 20 - 0 points  Months in reverse 0 points 0 points  Repeat phrase 0 points 0 points  Total Score - 0    Immunization History  Administered Date(s) Administered  . Influenza Split 03/11/2011, 03/26/2012  . Influenza, High Dose Seasonal PF 02/26/2018  . Influenza,inj,Quad PF,6+ Mos 02/14/2013, 03/11/2017  . Influenza-Unspecified 02/09/2013  . Moderna SARS-COVID-2 Vaccination 07/08/2019  . PPD Test 06/18/2009  . Pneumococcal Conjugate-13  04/24/2014  . Td 05/16/2003  . Tdap 11/04/2010   Screening Tests Health Maintenance  Topic Date Due  . PNEUMOCOCCAL POLYSACCHARIDE VACCINE AGE 63-64 HIGH RISK  Never done  . HIV Screening  Never done  . COVID-19 Vaccine (2 - Moderna 2-dose series) 08/05/2019  . MAMMOGRAM  10/05/2019 (Originally 04/14/2008)  . HEMOGLOBIN A1C  10/27/2019  . INFLUENZA VACCINE  12/11/2019  . URINE MICROALBUMIN  04/27/2020  . FOOT EXAM  05/01/2020  . OPHTHALMOLOGY EXAM  06/19/2020  . TETANUS/TDAP  11/03/2020  . PAP SMEAR-Modifier  03/24/2022  . COLONOSCOPY  07/08/2026  . Hepatitis C Screening  Completed      Plan:   Keep all routine maintenance appointments.   Follow up 09/27/19  Medicare Attestation I have personally reviewed: The patient's medical and social history Their use of alcohol, tobacco or illicit drugs Their current medications and supplements The patient's functional ability including ADLs,fall risks, home safety risks, cognitive, and hearing and visual impairment Diet and physical activities Evidence for depression   I have reviewed and discussed with patient certain preventive protocols, quality metrics, and best practice recommendations.      Varney Biles, LPN  7/61/6073   Agree  tMS

## 2019-09-07 DIAGNOSIS — K7460 Unspecified cirrhosis of liver: Secondary | ICD-10-CM | POA: Diagnosis not present

## 2019-09-07 DIAGNOSIS — K8020 Calculus of gallbladder without cholecystitis without obstruction: Secondary | ICD-10-CM | POA: Diagnosis not present

## 2019-09-23 ENCOUNTER — Other Ambulatory Visit: Payer: Self-pay

## 2019-09-27 ENCOUNTER — Other Ambulatory Visit: Payer: Self-pay | Admitting: Internal Medicine

## 2019-09-27 ENCOUNTER — Telehealth: Payer: Self-pay | Admitting: Internal Medicine

## 2019-09-27 ENCOUNTER — Other Ambulatory Visit: Payer: Self-pay

## 2019-09-27 ENCOUNTER — Encounter: Payer: Self-pay | Admitting: Internal Medicine

## 2019-09-27 ENCOUNTER — Ambulatory Visit (INDEPENDENT_AMBULATORY_CARE_PROVIDER_SITE_OTHER): Payer: Medicare Other | Admitting: Internal Medicine

## 2019-09-27 VITALS — BP 110/78 | HR 78 | Temp 97.8°F | Ht 66.0 in | Wt 204.0 lb

## 2019-09-27 DIAGNOSIS — R140 Abdominal distension (gaseous): Secondary | ICD-10-CM

## 2019-09-27 DIAGNOSIS — J209 Acute bronchitis, unspecified: Secondary | ICD-10-CM

## 2019-09-27 DIAGNOSIS — E119 Type 2 diabetes mellitus without complications: Secondary | ICD-10-CM | POA: Diagnosis not present

## 2019-09-27 DIAGNOSIS — D72819 Decreased white blood cell count, unspecified: Secondary | ICD-10-CM

## 2019-09-27 DIAGNOSIS — M19012 Primary osteoarthritis, left shoulder: Secondary | ICD-10-CM

## 2019-09-27 DIAGNOSIS — J4520 Mild intermittent asthma, uncomplicated: Secondary | ICD-10-CM | POA: Diagnosis not present

## 2019-09-27 DIAGNOSIS — K7460 Unspecified cirrhosis of liver: Secondary | ICD-10-CM

## 2019-09-27 DIAGNOSIS — K766 Portal hypertension: Secondary | ICD-10-CM

## 2019-09-27 DIAGNOSIS — K754 Autoimmune hepatitis: Secondary | ICD-10-CM | POA: Diagnosis not present

## 2019-09-27 DIAGNOSIS — R109 Unspecified abdominal pain: Secondary | ICD-10-CM

## 2019-09-27 DIAGNOSIS — B079 Viral wart, unspecified: Secondary | ICD-10-CM

## 2019-09-27 DIAGNOSIS — Z720 Tobacco use: Secondary | ICD-10-CM

## 2019-09-27 DIAGNOSIS — M19011 Primary osteoarthritis, right shoulder: Secondary | ICD-10-CM

## 2019-09-27 DIAGNOSIS — L680 Hirsutism: Secondary | ICD-10-CM | POA: Insufficient documentation

## 2019-09-27 DIAGNOSIS — J40 Bronchitis, not specified as acute or chronic: Secondary | ICD-10-CM

## 2019-09-27 DIAGNOSIS — K8020 Calculus of gallbladder without cholecystitis without obstruction: Secondary | ICD-10-CM

## 2019-09-27 DIAGNOSIS — B078 Other viral warts: Secondary | ICD-10-CM

## 2019-09-27 DIAGNOSIS — K3189 Other diseases of stomach and duodenum: Secondary | ICD-10-CM

## 2019-09-27 MED ORDER — PREDNISONE 20 MG PO TABS: 40.0000 mg | ORAL_TABLET | Freq: Every day | ORAL | 0 refills | Status: DC

## 2019-09-27 MED ORDER — OMEPRAZOLE 40 MG PO CPDR: 40.0000 mg | DELAYED_RELEASE_CAPSULE | Freq: Every day | ORAL | 3 refills | Status: DC

## 2019-09-27 MED ORDER — FLUCONAZOLE 150 MG PO TABS: 150.0000 mg | ORAL_TABLET | Freq: Once | ORAL | 0 refills | Status: AC

## 2019-09-27 MED ORDER — AZITHROMYCIN 250 MG PO TABS: ORAL_TABLET | ORAL | 0 refills | Status: DC

## 2019-09-27 MED ORDER — ALBUTEROL SULFATE HFA 108 (90 BASE) MCG/ACT IN AERS: 1.0000 | INHALATION_SPRAY | Freq: Four times a day (QID) | RESPIRATORY_TRACT | 3 refills | Status: DC | PRN

## 2019-09-27 NOTE — Progress Notes (Signed)
Chief Complaint  Patient presents with  . Follow-up  . Medication Problem    Had a reaction to the first Moderna vaccine and will not be getting the second dose. Pt was sick and in bed for almost 2 weeks.    F/u  1. DM 2 A1C 6.5 on metformin 500 mg qd fasting labs 11/2019 [facility] cmet, cbc, A1C, lipid 2. VV left hand palm side 2nd finger itching painful swelling at times prior bx in 2017 wants to see derm again 3. Chronic pain b/l shoulders mild AC OA left and right moderate with calcifications getting inj with rheum [facility] and helping using heat, cbd, aspercream, voltaren gell. Also chronic back pain 7/10 today  Declines PT today  On celebrex 100-200 mg qd and helps  4. AIH ALT 40 and AST normal 05/16/19 labs f/u [facility] doing well on meds imuran, plaquenil, prednisone 5 mg qd   Review of Systems  Constitutional: Negative for weight loss.  HENT: Negative for hearing loss.   Eyes: Negative for blurred vision.  Respiratory: Positive for cough, shortness of breath and wheezing.   Cardiovascular: Negative for chest pain.  Gastrointestinal: Negative for abdominal pain.  Musculoskeletal: Positive for back pain and joint pain.  Skin: Negative for rash.  Psychiatric/Behavioral: Negative for depression.   Past Medical History:  Diagnosis Date  . Asthma   . Autoimmune hepatitis ([place])   . Depression   . Diabetes mellitus without complication ([place])   . DUB (dysfunctional uterine bleeding)    2008 s/p hysterectomy  . Exposure to TB 2005   tx'ed x 6 months  . Gallstones   . Gastric ulcer   . GERD (gastroesophageal reflux disease)   . History of chicken pox   . Hx of migraines   . Liver disease    very low liver function, auto-immune hepatitis  . Motion sickness   . Osteoarthritis   . Systemic lupus erythematosus ([place]) 05/31/2010   Qualifier: Diagnosis of  By: Deborra Medina MD, Tanja Port    . Vitamin D deficiency    Past Surgical History:  Procedure Laterality Date  . ABDOMINAL HYSTERECTOMY    . BACK  SURGERY     L4/5 herniated disc repair 1992   . HALLUX VALGUS AUSTIN Left 02/09/2019   Procedure: HALLUX VALGUS AUSTIN ([place]);  Surgeon: [name], DPM;  Location: [facility];  Service: Podiatry;  Laterality: Left;  General with local  . HAMMER TOE SURGERY Left 02/09/2019   Procedure: HAMMER TOE CORRECTION;  Surgeon: [name], DPM;  Location: [facility];  Service: Podiatry;  Laterality: Left;  Diabetic - oral meds  . LAPAROSCOPIC HYSTERECTOMY    . TUBAL LIGATION     Family History  Problem Relation Age of Onset  . Cancer Mother        lung cancer smoker  . Heart disease Father   . Hypertension Father   . Hypertension Sister   . Sarcoidosis Sister        lung transplant failure  . Heart disease Brother   . Alcohol abuse Brother   . Diabetes Sister   . Diabetes Sister   . Kidney disease Sister   . Hypertension Sister   . Rheum arthritis Sister   . Diabetes Son   . Hypertension Son    Social History   Socioeconomic History  . Marital status: Legally Separated    Spouse name: Not on file  . Number of children: Not on file  . Years of education: Not  on file  . Highest education level: Not on file  Occupational History  . Not on file  Tobacco Use  . Smoking status: Current Every Day Smoker    Packs/day: 0.50    Years: 30.00    Pack years: 15.00    Types: Cigarettes  . Smokeless tobacco: Never Used  Substance and Sexual Activity  . Alcohol use: No  . Drug use: No  . Sexual activity: Not Currently  Other Topics Concern  . Not on file  Social History Narrative   4 pregnancies, 3 kids    Smoker    Technical sales engineer ed former LPN   Social Determinants of Health   Financial Resource Strain:   . Difficulty of Paying Living Expenses:   Food Insecurity:   . Worried About Charity fundraiser in the Last Year:   . Arboriculturist in the Last Year:   Transportation Needs:   . Film/video editor (Medical):   Marland Kitchen Lack of Transportation  (Non-Medical):   Physical Activity:   . Days of Exercise per Week:   . Minutes of Exercise per Session:   Stress:   . Feeling of Stress :   Social Connections:   . Frequency of Communication with Friends and Family:   . Frequency of Social Gatherings with Friends and Family:   . Attends Religious Services:   . Active Member of Clubs or Organizations:   . Attends Archivist Meetings:   Marland Kitchen Marital Status:   Intimate Partner Violence:   . Fear of Current or Ex-Partner:   . Emotionally Abused:   Marland Kitchen Physically Abused:   . Sexually Abused:    Current Meds  Medication Sig  . azaTHIOprine (IMURAN) 50 MG tablet Take 100 mg by mouth 2 (two) times daily.   . Blood Glucose Monitoring Suppl (ONE TOUCH ULTRA SYSTEM KIT) W/DEVICE KIT Test blood sugar as directed Dx E09.9  . calcium-vitamin D (OSCAL WITH D) 500-200 MG-UNIT per tablet Take 1 tablet by mouth.  . celecoxib (CELEBREX) 100 MG capsule Take 100-200 mg by mouth.   . Cholecalciferol (VITAMIN D3) 5000 units TABS Take by mouth.   Marland Kitchen glucose blood test strip Use as instructed  . hydroxychloroquine (PLAQUENIL) 200 MG tablet Take 200 mg by mouth 2 (two) times daily.   Marland Kitchen ibuprofen (ADVIL) 800 MG tablet Take 1 tablet (800 mg total) by mouth every 8 (eight) hours as needed.  . lactulose (CHRONULAC) 10 GM/15ML solution TAKE 30 ML BY MOUTH 4 TIMES DAILY AS NEEDED (Patient taking differently: Take 30 g by mouth. 50-60 g 1x per day not 30 g qid)  . lidocaine (LIDODERM) 5 % Place 1 patch onto the skin daily. Remove & Discard patch within 12 hours or as directed by MD  . loratadine (CLARITIN) 10 MG tablet Take 10 mg by mouth every other day as needed for allergies.  . Magnesium Oxide 500 MG TABS Take by mouth.  . metFORMIN (GLUCOPHAGE) 500 MG tablet TAKE 1 TABLET (500 MG TOTAL) BY MOUTH DAILY WITH BREAKFAST.  Marland Kitchen omeprazole (PRILOSEC) 40 MG capsule Take 1 capsule (40 mg total) by mouth daily. 30 min. Before a meal  . predniSONE (DELTASONE) 5 MG  tablet TAKE 1/2 TABLET ONCE DAILY (Patient taking differently: Take 5 mg by mouth daily with breakfast. )  . Secukinumab, 300 MG Dose, (COSENTYX SENSOREADY, 300 MG,) 150 MG/ML SOAJ Inject into the skin.  Marland Kitchen spironolactone (ALDACTONE) 50 MG tablet TAKE 1 TABLET  BY MOUTH EVERY DAY (Patient taking differently: Take 50 mg by mouth. Biweekly)  . traMADol (ULTRAM) 50 MG tablet TAKE 1 TABLET BY MOUTH EVERY 8 HOURS AS NEEDED (Patient taking differently: Take 100 mg by mouth daily as needed. )  . traMADol (ULTRAM) 50 MG tablet Take 1 tablet (50 mg total) by mouth every 6 (six) hours as needed.  . [DISCONTINUED] brompheniramine-pseudoephedrine-DM 30-2-10 MG/5ML syrup Take 5 mLs by mouth 4 (four) times daily as needed.  . [DISCONTINUED] omeprazole (PRILOSEC) 20 MG capsule TAKE 1 CAPSULE (20 MG TOTAL) BY MOUTH DAILY.   Allergies  Allergen Reactions  . Hydrocodone Other (See Comments)    Sweating, lethargy, dizzy  . Lipitor [Atorvastatin Calcium]     Myalgias, joint pain    . Milk-Related Compounds     GI upset  . Oxycodone Other (See Comments)    Sweating, lethargy  . Adhesive [Tape] Rash    Band aids   No results found for this or any previous visit (from the past 2160 hour(s)). Objective  Body mass index is 32.93 kg/m. Wt Readings from Last 3 Encounters:  09/27/19 204 lb (92.5 kg)  09/01/19 200 lb (90.7 kg)  05/23/19 200 lb (90.7 kg)   Temp Readings from Last 3 Encounters:  09/27/19 97.8 F (36.6 C) (Temporal)  05/23/19 98.1 F (36.7 C)  03/25/19 (!) 97.3 F (36.3 C) (Skin)   BP Readings from Last 3 Encounters:  09/27/19 110/78  05/23/19 133/88  03/25/19 132/80   Pulse Readings from Last 3 Encounters:  09/27/19 78  05/23/19 80  03/25/19 79    Physical Exam Vitals and nursing note reviewed.  Constitutional:      Appearance: Normal appearance. She is well-developed and well-groomed. She is obese.  HENT:     Head: Normocephalic and atraumatic.     Ears:     Comments:  Cerumen b/l ears Eyes:     Conjunctiva/sclera: Conjunctivae normal.     Pupils: Pupils are equal, round, and reactive to light.  Cardiovascular:     Rate and Rhythm: Normal rate and regular rhythm.     Heart sounds: Normal heart sounds.  Pulmonary:     Effort: Pulmonary effort is normal.     Breath sounds: Wheezing present.  Skin:    General: Skin is warm and dry.  Neurological:     General: No focal deficit present.     Mental Status: She is alert and oriented to person, place, and time. Mental status is at baseline.     Gait: Gait normal.  Psychiatric:        Attention and Perception: Attention and perception normal.        Mood and Affect: Mood and affect normal.        Speech: Speech normal.        Behavior: Behavior normal. Behavior is cooperative.        Thought Content: Thought content normal.        Cognition and Memory: Cognition and memory normal.        Judgment: Judgment normal.     Assessment  Plan  Acute Bronchitis with h/o asthma- Plan: albuterol (PROAIR HFA) 108 (90 Base) MCG/ACT inhaler, azithromycin (ZITHROMAX) 250 MG tablet Will ask if wants prednisone 40 mg x 1 week  Type 2 diabetes mellitus without complication, without long-term current use of insulin (HCC) - Plan: Comprehensive metabolic panel, Lipid panel, Hemoglobin A1c Cont meds  Autoimmune hepatitis ([place]) - Plan: Comprehensive metabolic panel, CBC with Differential/Platelet  F/u [facility]  Leukopenia, unspecified type - Plan: CBC with Differential/Platelet  Verruca vulgaris - Plan: Ambulatory referral to Dermatology  Hirsutism  Consider laser given info   HM Flu shot declines  Consider shingrix in future  prevnar had 04/24/14, consider pna 23 in future  Tdap had 11/04/10 Hep A/B immune 1/2 moderna refuses 2nd dose   rech healthy diet and smoking cessation   Pap had 03/31/07 cant exclude HSIL s/p hysterectomy for DUB in 2008 given history rec pt to think about it -pap 03/25/2019  negative   Mammogram pt declined on multiple occasionsdeclined today 03/24/2019 and declined 09/27/19  DEXA 10/05/15 normal, 10/06/17 lumbar and femur neck osteopenia Vitamin D 22.8 07/13/17-rec D3 5000 IU qd  Colonoscopy 07/08/16 diverticulosis, polpys x 3 hyperplastic, IH [facility] q 10 years  EGD 10/30/17 mild portal hypertensive gastropathy in gastric body  HCV neg 03/19/10, hep B sAg neg 03/19/10  declines HIV  Smoking x 40 years 1 pk lasts 2 days max 1 ppd FH mom lung was smoker -rec cessation  [place] [facility] saw 06/22/19 no toxic maculopathy, ocp normal f/u hydroxychloroquine yearly Dr. Edison Pace    Provider: Dr. Olivia Mackie McLean-Scocuzza-Internal Medicine

## 2019-09-27 NOTE — Patient Instructions (Addendum)
Shannon [name] tumeric/ginger/curcumin or Shannon [name] Made   Lidocaine pain patch and voltaren gel    Laser hair is the best [employer] [facility]  [address] [phone]  [place]  [place], [place] [address]  [phone]  Andreas Blower, [place]  [place]  [place], [place] [address]  [phone]  [phone] (Fax)    [facility] dermatology [place]    1800 QUIT NOW  Hirsutism and Masculinization Hirsutism is when a female has an excessive amount of hair in an area where a female would typically have hair growth, such as on the face, chest, or back. Masculinization is when a female's body develops certain characteristics that are like a female's body. What are the causes? Although many things can cause these conditions, the most common cause is polycystic ovary syndrome (PCOS). Taking certain medicines, such as androgens and anabolic steroids, may also cause these conditions. What are the signs or symptoms? Hirsutism Symptoms of this condition include excess hair growth on the:  Face.  Chest.  Thighs.  Back. Masculinization Symptoms of this condition include:  Deepening voice.  Loss of breast tissue.  Increased muscle mass.  Thinning hair on the head (balding).  Irregular or loss of menstrual periods.  Enlarged clitoris. How is this diagnosed? This condition is diagnosed based on a health history, physical exam, and tests. Tests may include blood tests and imaging studies. Your health care provider may recommend that you follow up with specialists to understand the cause of your condition or to help treat your condition. How is this treated? This condition may be treated by:  Taking medicines to help control hair growth.  Making lifestyle changes to help reduce hormone levels that are contributing to your condition.  Removing unwanted hair by shaving,  using creams, or waxing. More permanent ways to remove hair include electrolysis and laser treatments. If your symptoms are caused by certain medicines, your health care provider may have you stop taking them. Follow these instructions at home:  Take over-the-counter and prescription medicines only as told by your health care provider.  Talk to your health care provider about your treatment options and what may be best for you.  Maintain a healthy weight. If needed, talk to your health care provider about how to lose weight.  Keep all follow-up visits as told by your health care provider. This is important. Contact a health care provider if:  Your symptoms suddenly get worse.  You develop acne.  You have irregular menstrual periods.  You stop having your period.  You feel a lump in your lower abdomen. Summary  Hirsutism is when a female has an excessive amount of hair in an area where a female would typically have hair growth, such as on the face, chest, thighs, or back.  Masculinization is when a female's body develops characteristics like a female's body. This may include a deepening voice, loss of breast tissue, increased muscle mass, thinning hair (balding), changes in menstrual periods, and clitoris growth.  The most common cause of hirsutism is polycystic ovary syndrome (PCOS).  Treatment options include removing unwanted hair, taking medicines, and making lifestyle changes. Talk to your health care provider about which treatments are best for you.  Your health care provider may refer to you to specialists to find the cause of your condition. This information is not intended to replace advice given to you by your health care provider.  Make sure you discuss any questions you have with your health care provider. Document Revised: 03/09/2018 Document Reviewed: 03/09/2018 Elsevier Patient Education  [place].  Shoulder Exercises Ask your health care provider which  exercises are safe for you. Do exercises exactly as told by your health care provider and adjust them as directed. It is normal to feel mild stretching, pulling, tightness, or discomfort as you do these exercises. Stop right away if you feel sudden pain or your pain gets worse. Do not begin these exercises until told by your health care provider. Stretching exercises External rotation and abduction This exercise is sometimes called corner stretch. This exercise rotates your arm outward (external rotation) and moves your arm out from your body (abduction). 1. Stand in a doorway with one of your feet slightly in front of the other. This is called a staggered stance. If you cannot reach your forearms to the door frame, stand facing a corner of a room. 2. Choose one of the following positions as told by your health care provider: ? Place your hands and forearms on the door frame above your head. ? Place your hands and forearms on the door frame at the height of your head. ? Place your hands on the door frame at the height of your elbows. 3. Slowly move your weight onto your front foot until you feel a stretch across your chest and in the front of your shoulders. Keep your head and chest upright and keep your abdominal muscles tight. 4. Hold for __________ seconds. 5. To release the stretch, shift your weight to your back foot. Repeat __________ times. Complete this exercise __________ times a day. Extension, standing 1. Stand and hold a broomstick, a cane, or a similar object behind your back. ? Your hands should be a little wider than shoulder width apart. ? Your palms should face away from your back. 2. Keeping your elbows straight and your shoulder muscles relaxed, move the stick away from your body until you feel a stretch in your shoulders (extension). ? Avoid shrugging your shoulders while you move the stick. Keep your shoulder blades tucked down toward the middle of your back. 3. Hold for  __________ seconds. 4. Slowly return to the starting position. Repeat __________ times. Complete this exercise __________ times a day. Range-of-motion exercises Pendulum  1. Stand near a wall or a surface that you can hold onto for balance. 2. Bend at the waist and let your left / right arm hang straight down. Use your other arm to support you. Keep your back straight and do not lock your knees. 3. Relax your left / right arm and shoulder muscles, and move your hips and your trunk so your left / right arm swings freely. Your arm should swing because of the motion of your body, not because you are using your arm or shoulder muscles. 4. Keep moving your hips and trunk so your arm swings in the following directions, as told by your health care provider: ? Side to side. ? Forward and backward. ? In clockwise and counterclockwise circles. 5. Continue each motion for __________ seconds, or for as long as told by your health care provider. 6. Slowly return to the starting position. Repeat __________ times. Complete this exercise __________ times a day. Shoulder flexion, standing  1. Stand and hold a broomstick, a cane, or a similar object. Place your hands a little more than shoulder width apart on the object. Your left / right hand should be palm  up, and your other hand should be palm down. 2. Keep your elbow straight and your shoulder muscles relaxed. Push the stick up with your healthy arm to raise your left / right arm in front of your body, and then over your head until you feel a stretch in your shoulder (flexion). ? Avoid shrugging your shoulder while you raise your arm. Keep your shoulder blade tucked down toward the middle of your back. 3. Hold for __________ seconds. 4. Slowly return to the starting position. Repeat __________ times. Complete this exercise __________ times a day. Shoulder abduction, standing 1. Stand and hold a broomstick, a cane, or a similar object. Place your hands a  little more than shoulder width apart on the object. Your left / right hand should be palm up, and your other hand should be palm down. 2. Keep your elbow straight and your shoulder muscles relaxed. Push the object across your body toward your left / right side. Raise your left / right arm to the side of your body (abduction) until you feel a stretch in your shoulder. ? Do not raise your arm above shoulder height unless your health care provider tells you to do that. ? If directed, raise your arm over your head. ? Avoid shrugging your shoulder while you raise your arm. Keep your shoulder blade tucked down toward the middle of your back. 3. Hold for __________ seconds. 4. Slowly return to the starting position. Repeat __________ times. Complete this exercise __________ times a day. Internal rotation  1. Place your left / right hand behind your back, palm up. 2. Use your other hand to dangle an exercise band, a towel, or a similar object over your shoulder. Grasp the band with your left / right hand so you are holding on to both ends. 3. Gently pull up on the band until you feel a stretch in the front of your left / right shoulder. The movement of your arm toward the center of your body is called internal rotation. ? Avoid shrugging your shoulder while you raise your arm. Keep your shoulder blade tucked down toward the middle of your back. 4. Hold for __________ seconds. 5. Release the stretch by letting go of the band and lowering your hands. Repeat __________ times. Complete this exercise __________ times a day. Strengthening exercises External rotation  1. Sit in a stable chair without armrests. 2. Secure an exercise band to a stable object at elbow height on your left / right side. 3. Place a soft object, such as a folded towel or a small pillow, between your left / right upper arm and your body to move your elbow about 4 inches (10 cm) away from your side. 4. Hold the end of the exercise  band so it is tight and there is no slack. 5. Keeping your elbow pressed against the soft object, slowly move your forearm out, away from your abdomen (external rotation). Keep your body steady so only your forearm moves. 6. Hold for __________ seconds. 7. Slowly return to the starting position. Repeat __________ times. Complete this exercise __________ times a day. Shoulder abduction  1. Sit in a stable chair without armrests, or stand up. 2. Hold a __________ weight in your left / right hand, or hold an exercise band with both hands. 3. Start with your arms straight down and your left / right palm facing in, toward your body. 4. Slowly lift your left / right hand out to your side (abduction). Do not lift  your hand above shoulder height unless your health care provider tells you that this is safe. ? Keep your arms straight. ? Avoid shrugging your shoulder while you do this movement. Keep your shoulder blade tucked down toward the middle of your back. 5. Hold for __________ seconds. 6. Slowly lower your arm, and return to the starting position. Repeat __________ times. Complete this exercise __________ times a day. Shoulder extension 1. Sit in a stable chair without armrests, or stand up. 2. Secure an exercise band to a stable object in front of you so it is at shoulder height. 3. Hold one end of the exercise band in each hand. Your palms should face each other. 4. Straighten your elbows and lift your hands up to shoulder height. 5. Step back, away from the secured end of the exercise band, until the band is tight and there is no slack. 6. Squeeze your shoulder blades together as you pull your hands down to the sides of your thighs (extension). Stop when your hands are straight down by your sides. Do not let your hands go behind your body. 7. Hold for __________ seconds. 8. Slowly return to the starting position. Repeat __________ times. Complete this exercise __________ times a  day. Shoulder row 1. Sit in a stable chair without armrests, or stand up. 2. Secure an exercise band to a stable object in front of you so it is at waist height. 3. Hold one end of the exercise band in each hand. Position your palms so that your thumbs are facing the ceiling (neutral position). 4. Bend each of your elbows to a 90-degree angle (right angle) and keep your upper arms at your sides. 5. Step back until the band is tight and there is no slack. 6. Slowly pull your elbows back behind you. 7. Hold for __________ seconds. 8. Slowly return to the starting position. Repeat __________ times. Complete this exercise __________ times a day. Shoulder press-ups  1. Sit in a stable chair that has armrests. Sit upright, with your feet flat on the floor. 2. Put your hands on the armrests so your elbows are bent and your fingers are pointing forward. Your hands should be about even with the sides of your body. 3. Push down on the armrests and use your arms to lift yourself off the chair. Straighten your elbows and lift yourself up as much as you comfortably can. ? Move your shoulder blades down, and avoid letting your shoulders move up toward your ears. ? Keep your feet on the ground. As you get stronger, your feet should support less of your body weight as you lift yourself up. 4. Hold for __________ seconds. 5. Slowly lower yourself back into the chair. Repeat __________ times. Complete this exercise __________ times a day. Wall push-ups  1. Stand so you are facing a stable wall. Your feet should be about one arm-length away from the wall. 2. Lean forward and place your palms on the wall at shoulder height. 3. Keep your feet flat on the floor as you bend your elbows and lean forward toward the wall. 4. Hold for __________ seconds. 5. Straighten your elbows to push yourself back to the starting position. Repeat __________ times. Complete this exercise __________ times a day. This information  is not intended to replace advice given to you by your health care provider. Make sure you discuss any questions you have with your health care provider. Document Revised: 08/20/2018 Document Reviewed: 05/28/2018 Elsevier Patient Education  2020 Elsevier  Inc.    Acute Bronchitis, Adult  Acute bronchitis is sudden or acute swelling of the air tubes (bronchi) in the lungs. Acute bronchitis causes these tubes to fill with mucus, which can make it hard to breathe. It can also cause coughing or wheezing. In adults, acute bronchitis usually goes away within 2 weeks. A cough caused by bronchitis may last up to 3 weeks. Smoking, allergies, and asthma can make the condition worse. What are the causes? This condition can be caused by germs and by substances that irritate the lungs, including:  Cold and flu viruses. The most common cause of this condition is the virus that causes the common cold.  Bacteria.  Substances that irritate the lungs, including: ? Smoke from cigarettes and other forms of tobacco. ? Dust and pollen. ? Fumes from chemical products, gases, or burned fuel. ? Other materials that pollute indoor or outdoor air.  Close contact with someone who has acute bronchitis. What increases the risk? The following factors may make you more likely to develop this condition:  A weak body's defense system, also called the immune system.  A condition that affects your lungs and breathing, such as asthma. What are the signs or symptoms? Common symptoms of this condition include:  Lung and breathing problems, such as: ? Coughing. This may bring up clear, yellow, or green mucus from your lungs (sputum). ? Wheezing. ? Having too much mucus in your lungs (chest congestion). ? Having shortness of breath.  A fever.  Chills.  Aches and pains, including: ? Tightness in your chest and other body aches. ? A sore throat. How is this diagnosed? This condition is usually diagnosed based  on:  Your symptoms and medical history.  A physical exam. You may also have other tests, including tests to rule out other conditions, such pneumonia. These tests include:  A test of lung function.  Test of a mucus sample to look for the presence of bacteria.  Tests to check the oxygen level in your blood.  Blood tests.  Chest X-ray. How is this treated? Most cases of acute bronchitis clear up over time without treatment. Your health care provider may recommend:  Drinking more fluids. This can thin your mucus, which may improve your breathing.  Taking a medicine for a fever or cough.  Using a device that gets medicine into your lungs (inhaler) to help improve breathing and control coughing.  Using a vaporizer or a humidifier. These are machines that add water to the air to help you breathe better. Follow these instructions at home: Activity  Get plenty of rest.  Return to your normal activities as told by your health care provider. Ask your health care provider what activities are safe for you. Lifestyle  Drink enough fluid to keep your urine pale yellow.  Do not drink alcohol.  Do not use any products that contain nicotine or tobacco, such as cigarettes, e-cigarettes, and chewing tobacco. If you need help quitting, ask your health care provider. Be aware that: ? Your bronchitis will get worse if you smoke or breathe in other people's smoke (secondhand smoke). ? Your lungs will heal faster if you quit smoking. General instructions   Take over-the-counter and prescription medicines only as told by your health care provider.  Use an inhaler, vaporizer, or humidifier as told by your health care provider.  If you have a sore throat, gargle with a salt-water mixture 3-4 times a day or as needed. To make a salt-water mixture, completely  dissolve -1 tsp (3-6 g) of salt in 1 cup (237 mL) of warm water.  Keep all follow-up visits as told by your health care provider. This is  important. How is this prevented? To lower your risk of getting this condition again:  Wash your hands often with soap and water. If soap and water are not available, use hand sanitizer.  Avoid contact with people who have cold symptoms.  Try not to touch your mouth, nose, or eyes with your hands.  Avoid places where there are fumes from chemicals. Breathing these fumes will make your condition worse.  Get the flu shot every year. Contact a health care provider if:  Your symptoms do not improve after 2 weeks of treatment.  You vomit more than once or twice.  You have symptoms of dehydration such as: ? Dark urine. ? Dry skin or eyes. ? Increased thirst. ? Headaches. ? Confusion. ? Muscle cramps. Get help right away if you:  Cough up blood.  Feel pain in your chest.  Have severe shortness of breath.  Faint or keep feeling like you are going to faint.  Have a severe headache.  Have fever or chills that get worse. These symptoms may represent a serious problem that is an emergency. Do not wait to see if the symptoms will go away. Get medical help right away. Call your local emergency services (911 in the [place] Do not drive yourself to the hospital. Summary  Acute bronchitis is sudden (acute) inflammation of the air tubes (bronchi) between the windpipe and the lungs. In adults, acute bronchitis usually goes away within 2 weeks, although coughing may last 3 weeks or longer  Take over-the-counter and prescription medicines only as told by your health care provider.  Drink enough fluid to keep your urine pale yellow.  Contact a health care provider if your symptoms do not improve after 2 weeks of treatment.  Get help right away if you cough up blood, faint, or have chest pain or shortness of breath. This information is not intended to replace advice given to you by your health care provider. Make sure you discuss any questions you have with your health care  provider. Document Revised: 01/10/2019 Document Reviewed: 11/19/2018 Elsevier Patient Education  [place].

## 2019-09-27 NOTE — Telephone Encounter (Signed)
Patient is agreeable to trying this medication and would like this sent to [id number] - [place], [place]

## 2019-09-27 NOTE — Telephone Encounter (Signed)
Call pt to see if wants to try prednisone 40 mg x 1 week?

## 2019-09-29 ENCOUNTER — Telehealth: Payer: Self-pay | Admitting: Internal Medicine

## 2019-09-29 NOTE — Telephone Encounter (Signed)
Clarified for the patient that Prednisone was to be taken daily for a week (7 days) and NOT once weekly. Patient verbalized understanding

## 2019-09-29 NOTE — Telephone Encounter (Signed)
Pt called needing clarification on medication

## 2019-10-06 ENCOUNTER — Telehealth: Payer: Self-pay | Admitting: Internal Medicine

## 2019-10-06 NOTE — Telephone Encounter (Signed)
Left msg at [facility] derm for a return call to ofc from [place] .

## 2019-11-21 DIAGNOSIS — M19011 Primary osteoarthritis, right shoulder: Secondary | ICD-10-CM | POA: Diagnosis not present

## 2019-11-21 DIAGNOSIS — M461 Sacroiliitis, not elsewhere classified: Secondary | ICD-10-CM | POA: Diagnosis not present

## 2019-11-21 DIAGNOSIS — M25512 Pain in left shoulder: Secondary | ICD-10-CM | POA: Diagnosis not present

## 2019-11-21 DIAGNOSIS — K754 Autoimmune hepatitis: Secondary | ICD-10-CM | POA: Diagnosis not present

## 2019-11-21 DIAGNOSIS — M25572 Pain in left ankle and joints of left foot: Secondary | ICD-10-CM | POA: Diagnosis not present

## 2019-11-21 DIAGNOSIS — G8929 Other chronic pain: Secondary | ICD-10-CM | POA: Diagnosis not present

## 2019-11-21 DIAGNOSIS — M25532 Pain in left wrist: Secondary | ICD-10-CM | POA: Diagnosis not present

## 2019-11-21 DIAGNOSIS — M8588 Other specified disorders of bone density and structure, other site: Secondary | ICD-10-CM | POA: Diagnosis not present

## 2019-11-21 DIAGNOSIS — M3219 Other organ or system involvement in systemic lupus erythematosus: Secondary | ICD-10-CM | POA: Diagnosis not present

## 2019-11-21 DIAGNOSIS — M25571 Pain in right ankle and joints of right foot: Secondary | ICD-10-CM | POA: Diagnosis not present

## 2019-11-21 DIAGNOSIS — M545 Low back pain: Secondary | ICD-10-CM | POA: Diagnosis not present

## 2019-11-21 DIAGNOSIS — Z79899 Other long term (current) drug therapy: Secondary | ICD-10-CM | POA: Diagnosis not present

## 2019-11-21 DIAGNOSIS — M6520 Calcific tendinitis, unspecified site: Secondary | ICD-10-CM | POA: Diagnosis not present

## 2019-11-21 DIAGNOSIS — Z7952 Long term (current) use of systemic steroids: Secondary | ICD-10-CM | POA: Diagnosis not present

## 2019-11-21 DIAGNOSIS — Z1159 Encounter for screening for other viral diseases: Secondary | ICD-10-CM | POA: Diagnosis not present

## 2019-11-21 DIAGNOSIS — M85852 Other specified disorders of bone density and structure, left thigh: Secondary | ICD-10-CM | POA: Diagnosis not present

## 2019-11-21 DIAGNOSIS — M25511 Pain in right shoulder: Secondary | ICD-10-CM | POA: Diagnosis not present

## 2019-11-21 DIAGNOSIS — M25531 Pain in right wrist: Secondary | ICD-10-CM | POA: Diagnosis not present

## 2019-11-21 DIAGNOSIS — Z885 Allergy status to narcotic agent status: Secondary | ICD-10-CM | POA: Diagnosis not present

## 2019-11-21 DIAGNOSIS — M329 Systemic lupus erythematosus, unspecified: Secondary | ICD-10-CM | POA: Diagnosis not present

## 2019-12-22 ENCOUNTER — Telehealth: Payer: Self-pay | Admitting: Internal Medicine

## 2019-12-22 NOTE — Telephone Encounter (Signed)
  Pain where?  [facility] sees [facility] and I believe they give her pain meds  Does she want referral to pain clinic?   2. Also  she sees [facility] she can call and schedule appt with them unless she wants local GI? 05/11/2019 Telephone [place]   [address]   [place], [place] [address]   [place]   3. Why did she stop prilosec

## 2019-12-22 NOTE — Telephone Encounter (Signed)
Pt called in stated that she is still in pain and that she stopped takinomeprazole (PRILOSEC) 40 MG capsule she need something prescribed for pain and need a referral for a GI doctor

## 2019-12-23 NOTE — Telephone Encounter (Signed)
Placed urgent referral to GI  She had cirrhosis, hepatitis autoimmune and h/o gallstones   I think she needs to be evaluated in person urgent care or the ED given her history of significant GI issues I cant prescribe her anything without seeing her and I would rec in person evaluation

## 2019-12-23 NOTE — Telephone Encounter (Signed)
Patient informed and verbalized understanding

## 2019-12-23 NOTE — Addendum Note (Signed)
Addended by: Orland Mustard on: 12/23/2019 04:25 PM   Modules accepted: Orders

## 2019-12-23 NOTE — Telephone Encounter (Signed)
-  lower abdominal pain, 5/10 pain.   -Patient does not want a referral to pain clinic. States she was told she needs to be seen by [facility] Doctor.   -Patient would like a referral to [facility] [place] as they are closer.   -Patient stopped the Omeprazole as it was not helping and she was feeling worse.   Patient wanting to know if something else could be sent in to help the pain as she waits to see GI?   Please advise

## 2020-01-02 ENCOUNTER — Ambulatory Visit (INDEPENDENT_AMBULATORY_CARE_PROVIDER_SITE_OTHER): Payer: Medicare Other | Admitting: Nurse Practitioner

## 2020-01-02 ENCOUNTER — Encounter: Payer: Self-pay | Admitting: Nurse Practitioner

## 2020-01-02 ENCOUNTER — Other Ambulatory Visit: Payer: Self-pay

## 2020-01-02 ENCOUNTER — Ambulatory Visit (INDEPENDENT_AMBULATORY_CARE_PROVIDER_SITE_OTHER): Payer: Medicare Other | Admitting: Pharmacist

## 2020-01-02 VITALS — BP 118/78 | HR 83 | Temp 98.3°F | Ht 66.0 in | Wt 191.0 lb

## 2020-01-02 DIAGNOSIS — E119 Type 2 diabetes mellitus without complications: Secondary | ICD-10-CM

## 2020-01-02 DIAGNOSIS — K754 Autoimmune hepatitis: Secondary | ICD-10-CM

## 2020-01-02 DIAGNOSIS — T380X5A Adverse effect of glucocorticoids and synthetic analogues, initial encounter: Secondary | ICD-10-CM | POA: Insufficient documentation

## 2020-01-02 DIAGNOSIS — E273 Drug-induced adrenocortical insufficiency: Secondary | ICD-10-CM

## 2020-01-02 DIAGNOSIS — M3219 Other organ or system involvement in systemic lupus erythematosus: Secondary | ICD-10-CM

## 2020-01-02 DIAGNOSIS — K7460 Unspecified cirrhosis of liver: Secondary | ICD-10-CM

## 2020-01-02 LAB — CBC WITH DIFFERENTIAL/PLATELET
Basophils Absolute: 0 K/uL (ref 0.0–0.1)
Basophils Relative: 0.5 % (ref 0.0–3.0)
Eosinophils Absolute: 0.1 K/uL (ref 0.0–0.7)
Eosinophils Relative: 2.8 % (ref 0.0–5.0)
HCT: 42.4 % (ref 36.0–46.0)
Hemoglobin: 14 g/dL (ref 12.0–15.0)
Lymphocytes Relative: 48.2 % — ABNORMAL HIGH (ref 12.0–46.0)
Lymphs Abs: 1.8 K/uL (ref 0.7–4.0)
MCHC: 33 g/dL (ref 30.0–36.0)
MCV: 107.8 fl — ABNORMAL HIGH (ref 78.0–100.0)
Monocytes Absolute: 0.3 K/uL (ref 0.1–1.0)
Monocytes Relative: 7.9 % (ref 3.0–12.0)
Neutro Abs: 1.5 K/uL (ref 1.4–7.7)
Neutrophils Relative %: 40.6 % — ABNORMAL LOW (ref 43.0–77.0)
Platelets: 181 K/uL (ref 150.0–400.0)
RBC: 3.93 Mil/uL (ref 3.87–5.11)
RDW: 14.9 % (ref 11.5–15.5)
WBC: 3.7 K/uL — ABNORMAL LOW (ref 4.0–10.5)

## 2020-01-02 LAB — COMPREHENSIVE METABOLIC PANEL
ALT: 29 U/L (ref 0–35)
AST: 29 U/L (ref 0–37)
Albumin: 4.2 g/dL (ref 3.5–5.2)
Alkaline Phosphatase: 63 U/L (ref 39–117)
BUN: 14 mg/dL (ref 6–23)
CO2: 25 mEq/L (ref 19–32)
Calcium: 10.1 mg/dL (ref 8.4–10.5)
Chloride: 105 mEq/L (ref 96–112)
Creatinine, Ser: 0.77 mg/dL (ref 0.40–1.20)
GFR: 91.99 mL/min (ref >60.00)
Glucose, Bld: 89 mg/dL (ref 70–99)
Potassium: 3.9 mEq/L (ref 3.5–5.1)
Sodium: 140 mEq/L (ref 135–145)
Total Bilirubin: 0.4 mg/dL (ref 0.2–1.2)
Total Protein: 7.6 g/dL (ref 6.0–8.3)

## 2020-01-02 LAB — CORTISOL: Cortisol, Plasma: 9.5 ug/dL

## 2020-01-02 LAB — TSH: TSH: 1.32 u[IU]/mL (ref 0.35–4.50)

## 2020-01-02 MED ORDER — INSULIN DEGLUDEC 100 UNIT/ML ~~LOC~~ SOPN: 10.0000 [IU] | PEN_INJECTOR | Freq: Every day | SUBCUTANEOUS | 0 refills | Status: AC

## 2020-01-02 MED ORDER — PEN NEEDLES 31G X 6 MM MISC: 0 refills | Status: DC

## 2020-01-02 MED ORDER — BLOOD GLUCOSE METER KIT: PACK | 0 refills | Status: AC

## 2020-01-02 MED ORDER — METFORMIN HCL ER 500 MG PO TB24: 1000.0000 mg | ORAL_TABLET | Freq: Two times a day (BID) | ORAL | 3 refills | Status: DC

## 2020-01-02 MED ORDER — BLOOD GLUCOSE MONITOR KIT: PACK | 0 refills | Status: AC

## 2020-01-02 NOTE — Patient Instructions (Addendum)
To the lab today.   I have placed a referral in to ENDOCRINOLOGY- very- very important   Continue metformin 1000 mg twice daily.   Start Tyler Aas (long acting insulin) 10 units daily. We will discuss your blood sugar at our next call to decide if we need to tweak that dose. If you have episodes of LOW blood sugar, please call us here at the clinic or call [facility] directly for instruction [phone]).   Moving forward, we will plan to transition to something other than insulin.

## 2020-01-02 NOTE — Patient Instructions (Signed)
Visit Information  Goals Addressed              This Visit's Progress     Patient Stated   .  PharmD "I need help keeping everything together" (pt-stated)        CARE PLAN ENTRY (see longitudinal plan of care for additional care plan information)  Current Barriers:  . Social, financial, community barriers:  o Reports historical concerns communicating between her difference medical care team members.  o Reports stomach upset/diarrhea today, though taking lactulose no more than twice weekly as she is afraid of impact on blood sugars o Brings 2 old meters today, all strips and lancets are expired.  . Polypharmacy; complex patient with multiple comorbidities including SLE, autoimmune hepatitis, liver cirrhosis, steroid-induced DM, OA, osteopenia, asthma  . Most recent eGFR: 92 mL/min o T2DM: reports that she self-titrated metformin to 1000 mg BID, also took her sister's Levemir and titrated to 10 units daily. Checking fastings daily. Readings from 400s to 120s over the past few days. Denies hx of alternative medications for DM. Reports stomach  o SLE: follows w/ [facility] rheumatology. Had planned to taper prednisone and start Humira, but patient reports today that stopped prednisone d/t fear of hyperglycemia; hydroxychloroquine 200 mg BID, azathioprine 100 mg daily. Plan to start Humira once arrived from speciality pharmacy. Celecoxib 100-200 mg PRN  Pharmacist Clinical Goal(s):  Marland Kitchen Over the next 90 days, patient will work with PharmD and provider towards optimized medication management  Interventions: . Comprehensive medication review performed; medication list updated in electronic medical record . Inter-disciplinary care team collaboration (see longitudinal plan of care) . Discussed importance of communicating with healthcare team with questions/concerns, prior to self-adjusting and initiating medications. She verbalized understanding . Agree to continue metformin 1000 mg BID. Collaborated  w/ NP to change to extended release, as patient reporting GI upset.  Nash Dimmer w/ NP for new script for glucometer, strips, and lancets.  . Discussed adjunctive agents to metformin. Discussed GLP1 and SGLT2, but concerned that side effects (GI, GU, etc) related to these medications may be clouded by other uncontrolled conditions at this time. Recommend continuing basal insulin, and transition to non-insulin agent once patient's comorbid conditions are better controlled . Recommend change to longer-lasting basal insulin. Start Tresiba 10 units daily  . Will place referral for RN CM for care coordination and disease self-management education  Patient Self Care Activities:  . Patient will take medications as prescribed  Initial goal documentation        Shannon [name] was given information about Chronic Care Management services today including:  1. CCM service includes personalized support from designated clinical staff supervised by her physician, including individualized plan of care and coordination with other care providers 2. 24/7 contact phone numbers for assistance for urgent and routine care needs. 3. Service will only be billed when office clinical staff spend 20 minutes or more in a month to coordinate care. 4. Only one practitioner may furnish and bill the service in a calendar month. 5. The patient may stop CCM services at any time (effective at the end of the month) by phone call to the office staff. 6. The patient will be responsible for cost sharing (co-pay) of up to 20% of the service fee (after annual deductible is met).  Patient agreed to services and verbal consent obtained.   The patient verbalized understanding of instructions provided today and declined a print copy of patient instruction materials.   Plan: - Scheduled f/u  call in ~3 weeks  Shannon [name], PharmD, [place], [place] Pharmacist [place] [phone]

## 2020-01-02 NOTE — Progress Notes (Signed)
Established Patient Office Visit  Subjective:  Patient ID: Shannon [name], female    DOB: June 17, 1955  Age: 64 y.o. MRN: [id number]  CC:  Chief Complaint  Patient presents with  . Acute Visit    elevated BS    HPI Shannon [name] is a 64 yo w complex PMH who presents  for recent uncontrolled DM following  Prednisone 40 mg  dose increase two months ago. She has lupus and has taken prednisone 5 mg daily for about 30 years. When she took 40 mg daily for one week - that drove her blood sugars up >400 and they stayed elevated on her current Metformin 500 mg daily.She had day and night polyuria, weight loss and fatigue.  She borrowed someone's Levemir on 12/13/19 and started taking 4 units per day and then increased 6 -10 units once a day for the last week. Her blood sugar are responding. This am her FBS 104. The urinary frequency has stopped- it was terrible she had polyuria prior.  She had a house call from [facility] care visit and POC  A1c 9.7 on 12/24/19.   She also weaned her prednisone 1/2 tab for 3 days and stopped - her last dose was 3 weeks ago. She feels weak and has to fight sleep. She feels horrible and has a flare of her H pylori. Her stomach has abdominal diffuse pain over a month, nausea, bloating, after a few bites of food and feels like it is on fire in the entire stomach bottom to top. She has not vomited.  She has autoimmune hepatitis, cirrhosis of the liver, gallstones, SLE, chronic pain and chronic abdominal bloating. She sees a Rheum and got approved for Humira. She has been on off prednisone for > 15 days.   She had one Moderna vaccine and had to spend 3 weeks in bed and is fearful of taking the second vaccine. She has not completed her COVID series.   Past Medical History:  Diagnosis Date  . Asthma   . Autoimmune hepatitis ([place])   . Depression   . Diabetes mellitus without complication ([place])   . DUB (dysfunctional uterine bleeding)    2008 s/p hysterectomy  .  Exposure to TB 2005   tx'ed x 6 months  . Gallstones   . Gastric ulcer   . GERD (gastroesophageal reflux disease)   . History of chicken pox   . Hx of migraines   . Liver disease    very low liver function, auto-immune hepatitis  . Motion sickness   . Osteoarthritis   . Systemic lupus erythematosus ([place]) 05/31/2010   Qualifier: Diagnosis of  By: Deborra Medina MD, Tanja Port    . Vitamin D deficiency     Past Surgical History:  Procedure Laterality Date  . ABDOMINAL HYSTERECTOMY    . BACK SURGERY     L4/5 herniated disc repair 1992   . HALLUX VALGUS AUSTIN Left 02/09/2019   Procedure: HALLUX VALGUS AUSTIN ([place]);  Surgeon: [name], DPM;  Location: [facility];  Service: Podiatry;  Laterality: Left;  General with local  . HAMMER TOE SURGERY Left 02/09/2019   Procedure: HAMMER TOE CORRECTION;  Surgeon: [name], DPM;  Location: [facility];  Service: Podiatry;  Laterality: Left;  Diabetic - oral meds  . LAPAROSCOPIC HYSTERECTOMY    . TUBAL LIGATION      Family History  Problem Relation Age of Onset  . Cancer Mother        lung cancer  smoker  . Heart disease Father   . Hypertension Father   . Hypertension Sister   . Sarcoidosis Sister        lung transplant failure  . Heart disease Brother   . Alcohol abuse Brother   . Diabetes Sister   . Diabetes Sister   . Kidney disease Sister   . Hypertension Sister   . Rheum arthritis Sister   . Diabetes Son   . Hypertension Son     Social History   Socioeconomic History  . Marital status: Legally Separated    Spouse name: Not on file  . Number of children: Not on file  . Years of education: Not on file  . Highest education level: Not on file  Occupational History  . Not on file  Tobacco Use  . Smoking status: Current Every Day Smoker    Packs/day: 0.50    Years: 30.00    Pack years: 15.00    Types: Cigarettes  . Smokeless tobacco: Never Used  Vaping Use  . Vaping Use: Never used  Substance and  Sexual Activity  . Alcohol use: No  . Drug use: No  . Sexual activity: Not Currently  Other Topics Concern  . Not on file  Social History Narrative   4 pregnancies, 3 kids    Smoker    Technical sales engineer ed former LPN   Social Determinants of Health   Financial Resource Strain: Low Risk   . Difficulty of Paying Living Expenses: Not hard at all  Food Insecurity:   . Worried About Charity fundraiser in the Last Year: Not on file  . Ran Out of Food in the Last Year: Not on file  Transportation Needs:   . Lack of Transportation (Medical): Not on file  . Lack of Transportation (Non-Medical): Not on file  Physical Activity:   . Days of Exercise per Week: Not on file  . Minutes of Exercise per Session: Not on file  Stress:   . Feeling of Stress : Not on file  Social Connections:   . Frequency of Communication with Friends and Family: Not on file  . Frequency of Social Gatherings with Friends and Family: Not on file  . Attends Religious Services: Not on file  . Active Member of Clubs or Organizations: Not on file  . Attends Archivist Meetings: Not on file  . Marital Status: Not on file  Intimate Partner Violence:   . Fear of Current or Ex-Partner: Not on file  . Emotionally Abused: Not on file  . Physically Abused: Not on file  . Sexually Abused: Not on file    Outpatient Medications Prior to Visit  Medication Sig Dispense Refill  . albuterol (PROAIR HFA) 108 (90 Base) MCG/ACT inhaler Inhale 1-2 puffs into the lungs every 6 (six) hours as needed for wheezing or shortness of breath. 54 g 3  . azaTHIOprine (IMURAN) 50 MG tablet Take 100 mg by mouth 2 (two) times daily.     . Blood Glucose Monitoring Suppl (ONE TOUCH ULTRA SYSTEM KIT) W/DEVICE KIT Test blood sugar as directed Dx E09.9 1 each 0  . calcium-vitamin D (OSCAL WITH D) 500-200 MG-UNIT per tablet Take 1 tablet by mouth.    . celecoxib (CELEBREX) 100 MG capsule Take 100-200 mg by mouth.     . Cholecalciferol  (VITAMIN D3) 5000 units TABS Take by mouth.     . diclofenac sodium (VOLTAREN) 1 % GEL Apply topically 4 (four)  times daily.    Marland Kitchen glucose blood test strip Use as instructed 100 each 12  . hydroxychloroquine (PLAQUENIL) 200 MG tablet Take 200 mg by mouth 2 (two) times daily.   11  . ibuprofen (ADVIL) 800 MG tablet Take 1 tablet (800 mg total) by mouth every 8 (eight) hours as needed. 30 tablet 0  . lactulose (CHRONULAC) 10 GM/15ML solution TAKE 30 ML BY MOUTH 4 TIMES DAILY AS NEEDED (Patient taking differently: Take 30 g by mouth. 50-60 g 1x per day not 30 g qid) 10800 mL 3  . lidocaine (LIDODERM) 5 % Place 1 patch onto the skin daily. Remove & Discard patch within 12 hours or as directed by MD 30 patch 5  . loratadine (CLARITIN) 10 MG tablet Take 10 mg by mouth every other day as needed for allergies.    . Magnesium Oxide 500 MG TABS Take by mouth.    . spironolactone (ALDACTONE) 50 MG tablet TAKE 1 TABLET BY MOUTH EVERY DAY (Patient taking differently: Take 50 mg by mouth. Biweekly) 90 tablet 1  . traMADol (ULTRAM) 50 MG tablet TAKE 1 TABLET BY MOUTH EVERY 8 HOURS AS NEEDED (Patient taking differently: Take 100 mg by mouth daily as needed. ) 60 tablet 0  . traMADol (ULTRAM) 50 MG tablet Take 1 tablet (50 mg total) by mouth every 6 (six) hours as needed. 20 tablet 0  . vitamin B-12 (CYANOCOBALAMIN) 100 MCG tablet Take 100 mcg by mouth daily.    . metFORMIN (GLUCOPHAGE) 500 MG tablet TAKE 1 TABLET (500 MG TOTAL) BY MOUTH DAILY WITH BREAKFAST. 90 tablet 3  . azithromycin (ZITHROMAX) 250 MG tablet 2 pills day 1 and 1 pill day 2-5 with food 6 tablet 0  . ibuprofen (ADVIL) 800 MG tablet Take 1 tablet (800 mg total) by mouth every 8 (eight) hours as needed for moderate pain. (Patient not taking: Reported on 09/27/2019) 15 tablet 0  . ibuprofen (ADVIL,MOTRIN) 800 MG tablet TAKE 1 TABLET (800 MG TOTAL) BY MOUTH DAILY. (Patient not taking: Reported on 09/27/2019) 30 tablet 5  . omeprazole (PRILOSEC) 40 MG  capsule Take 1 capsule (40 mg total) by mouth daily. 30 min. Before a meal 90 capsule 3  . predniSONE (DELTASONE) 20 MG tablet Take 2 tablets (40 mg total) by mouth daily with breakfast. 14 tablet 0  . predniSONE (DELTASONE) 5 MG tablet TAKE 1/2 TABLET ONCE DAILY (Patient taking differently: Take 5 mg by mouth daily with breakfast. ) 30 tablet 2  . Secukinumab, 300 MG Dose, (COSENTYX SENSOREADY, 300 MG,) 150 MG/ML SOAJ Inject into the skin.     No facility-administered medications prior to visit.    Allergies  Allergen Reactions  . Hydrocodone Other (See Comments)    Sweating, lethargy, dizzy  . Lipitor [Atorvastatin Calcium]     Myalgias, joint pain    . Milk-Related Compounds     GI upset  . Oxycodone Other (See Comments)    Sweating, lethargy  . Adhesive [Tape] Rash    Band aids    Review of Systems  Constitutional: Positive for fatigue and unexpected weight change. Negative for chills and fever.       Lost 15 lbs in one week when sugars were 467 in July and 361 on 12/13/19 after increase in prednisone dose 5 mg to 40 mg  for 1 week.   HENT: Negative for congestion and sinus pressure.   Eyes: Positive for visual disturbance.       Saw eye doctor  in June- vision is OK. Blurred when sugars were high.  Respiratory: Negative for cough and shortness of breath.   Cardiovascular: Negative for chest pain, palpitations and leg swelling.  Gastrointestinal: Positive for abdominal pain and diarrhea. Negative for blood in stool, constipation, nausea and vomiting.       Abdomen with diarrhea and stomach ache, bloating x 1 month. She has diarrhea up to 5 times a day and rarely wakes up out of sleep with diarrhea. Lactulose x2 per week for liver. Diarrhea is out of the ordinary- mushy and liquid- no bloods.   Endocrine: Positive for polyuria.  Genitourinary: Negative for difficulty urinating, flank pain and pelvic pain.  Musculoskeletal: Positive for back pain.  Skin: Negative.   Neurological:  Negative for dizziness, seizures, light-headedness and headaches.  Psychiatric/Behavioral:       No depression anxiety concerns. No SI/HI      Objective:    Physical Exam Vitals reviewed.  Constitutional:      Appearance: Normal appearance.  HENT:     Head: Normocephalic.  Eyes:     Conjunctiva/sclera: Conjunctivae normal.     Pupils: Pupils are equal, round, and reactive to light.  Cardiovascular:     Rate and Rhythm: Normal rate and regular rhythm.     Pulses: Normal pulses.     Heart sounds: Normal heart sounds.  Pulmonary:     Effort: Pulmonary effort is normal.     Breath sounds: Normal breath sounds.  Abdominal:     Palpations: Abdomen is soft.     Tenderness: There is no abdominal tenderness.     Comments: No distention and non tender in all quads.   Musculoskeletal:        General: Normal range of motion.     Cervical back: Normal range of motion and neck supple.  Skin:    General: Skin is warm and dry.  Neurological:     General: No focal deficit present.     Mental Status: She is alert and oriented to person, place, and time.  Psychiatric:        Mood and Affect: Mood normal.        Behavior: Behavior normal.        Thought Content: Thought content normal.        Judgment: Judgment normal.     BP 118/78 (BP Location: Left Arm, Patient Position: Sitting, Cuff Size: Normal)   Pulse 83   Temp 98.3 F (36.8 C) (Oral)   Ht _0  (1.676 m)   Wt 191 lb (86.6 kg)   SpO2 97%   BMI 30.83 kg/m  Wt Readings from Last 3 Encounters:  01/02/20 191 lb (86.6 kg)  09/27/19 204 lb (92.5 kg)  09/01/19 200 lb (90.7 kg)   Pulse Readings from Last 3 Encounters:  01/02/20 83  09/27/19 78  05/23/19 80    BP Readings from Last 3 Encounters:  01/02/20 118/78  09/27/19 110/78  05/23/19 133/88    Lab Results  Component Value Date   CHOL 193 04/28/2019   HDL 67 04/28/2019   LDLCALC 114 (H) 04/28/2019   TRIG 66 04/28/2019   CHOLHDL 2.9 04/28/2019      Health  Maintenance Due  Topic Date Due  . PNEUMOCOCCAL POLYSACCHARIDE VACCINE AGE 1-64 HIGH RISK  Never done  . MAMMOGRAM  Never done  . COVID-19 Vaccine (2 - Moderna 2-dose series) 08/05/2019  . HEMOGLOBIN A1C  10/27/2019    There are no preventive care reminders to  display for this patient.  Lab Results  Component Value Date   TSH 1.32 01/02/2020   Lab Results  Component Value Date   WBC 3.7 (L) 01/02/2020   HGB 14.0 01/02/2020   HCT 42.4 01/02/2020   MCV 107.8 (H) 01/02/2020   PLT 181.0 01/02/2020   Lab Results  Component Value Date   NA 140 01/02/2020   K 3.9 01/02/2020   CO2 25 01/02/2020   GLUCOSE 89 01/02/2020   BUN 14 01/02/2020   CREATININE 0.77 01/02/2020   BILITOT 0.4 01/02/2020   ALKPHOS 63 01/02/2020   AST 29 01/02/2020   ALT 29 01/02/2020   PROT 7.6 01/02/2020   ALBUMIN 4.2 01/02/2020   CALCIUM 10.1 01/02/2020   GFR 91.99 01/02/2020   Lab Results  Component Value Date   CHOL 193 04/28/2019   Lab Results  Component Value Date   HDL 67 04/28/2019   Lab Results  Component Value Date   LDLCALC 114 (H) 04/28/2019   Lab Results  Component Value Date   TRIG 66 04/28/2019   Lab Results  Component Value Date   CHOLHDL 2.9 04/28/2019   Lab Results  Component Value Date   HGBA1C 6.5 (H) 04/28/2019      Assessment & Plan:   Problem List Items Addressed This Visit      Digestive   Autoimmune hepatitis ([place]) (Chronic)   Cirrhosis of liver ([place])     Endocrine   Type 2 diabetes mellitus without complication, without long-term current use of insulin (HCC) - Primary    Continue metformin 1000 mg twice daily.   Start Tyler Aas (long acting insulin) 10 units daily. We will discuss your blood sugar at our next call to decide if we need to tweak that dose. If you have episodes of LOW blood sugar, please call us here at the clinic or call Catie directly for instruction [phone]).   Continue to monitor FBS daily.        Relevant Medications    insulin degludec (TRESIBA) 100 UNIT/ML FlexTouch Pen   metFORMIN (GLUCOPHAGE-XR) 500 MG 24 hr tablet   blood glucose meter kit and supplies KIT   Other Relevant Orders   Comprehensive metabolic panel (Completed)   Referral to Chronic Care Management Services   Adrenal insufficiency due to corticosteroid withdrawal ([place])    Epic Chat with Dr. Dwyane Dee and her recommends Cortisol- spot check since she ahs been off of her prednisone for >15 days.  Orthostatic vital signs are normal today.  Patient has no evidence of tachyarrhythmia, hypotension, and seems to be doing all right.  She does report increase of chronic abdominal pain which may be a symptom of steroid reduction.  Her abdominal exam however is unremarkable.  We will get her into see endocrinology as soon as possible.      Relevant Orders   CBC with Differential/Platelet (Completed)   TSH (Completed)   Comprehensive metabolic panel (Completed)   Referral to Chronic Care Management Services   Cortisol (Completed)   Ambulatory referral to Endocrinology     Other   Systemic lupus erythematosus ([place]) (Chronic)      Orthostatic vital signs performed and normal Supine 110/70 pulse 76 Sitting 112/78 pulse 75 Standing 110/72 pulse 75 To the lab today.   I have placed a referral in to ENDOCRINOLOGY- very- very important   Continue metformin 1000 mg twice daily.  Stop your friend's Levemir.   Start Tyler Aas (long acting insulin) 10 units daily. We will discuss your  blood sugar at our next call to decide if we need to tweak that dose. If you have episodes of LOW blood sugar, please call us here at the clinic or call Catie directly for instruction [phone]).  Continue to monitor FBS daily. Moving forward, we will plan to transition to something other than insulin.   Meds ordered this encounter  Medications  . Insulin Pen Needle (PEN NEEDLES) 31G X 6 MM MISC    Sig: Use with [place] as instructed.    Dispense:  100 each     Refill:  0    Order Specific Question:   Supervising Provider    Answer:   Einar Pheasant [id number]  . blood glucose meter kit and supplies    Sig: Dispense based on patient and insurance preference. Use up to four times daily as directed. (FOR ICD-10 E10.9, E11.9).    Dispense:  1 each    Refill:  0    Order Specific Question:   Supervising Provider    Answer:   Einar Pheasant [[id number]]    Order Specific Question:   Number of strips    Answer:   100    Order Specific Question:   Number of lancets    Answer:   100  . insulin degludec (TRESIBA) 100 UNIT/ML FlexTouch Pen    Sig: Inject 0.1 mLs (10 Units total) into the skin daily.    Dispense:  3 mL    Refill:  0    Order Specific Question:   Supervising Provider    Answer:   Einar Pheasant [id number]  . metFORMIN (GLUCOPHAGE-XR) 500 MG 24 hr tablet    Sig: Take 2 tablets (1,000 mg total) by mouth in the morning and at bedtime.    Dispense:  360 tablet    Refill:  3  . blood glucose meter kit and supplies KIT    Sig: Dispense based on patient and insurance preference. Use up to four times daily as directed. (FOR ICD-9 250.00, 250.01).    Dispense:  1 each    Refill:  0    Order Specific Question:   Number of strips    Answer:   400    Order Specific Question:   Number of lancets    Answer:   400    Follow-up: Return in about 2 weeks (around 01/16/2020).  This visit occurred during the SARS-CoV-2 public health emergency.  Safety protocols were in place, including screening questions prior to the visit, additional usage of staff PPE, and extensive cleaning of exam room while observing appropriate contact time as indicated for disinfecting solutions.   Denice Paradise, NP

## 2020-01-02 NOTE — Chronic Care Management (AMB) (Signed)
Chronic Care Management   Note  01/02/2020 Name: Shannon [name] MRN: [id number] DOB: Sep 13, 1955   Subjective:  Burman Blacksmith is a 64 y.o. year old female who is a primary care patient of McLean-Scocuzza, Nino Glow, MD. The CCM team was consulted for assistance with chronic disease management and care coordination needs.    Met with patient face to face for medication management review.  Ms. Fojtik was given information about Chronic Care Management services today including:  1. CCM service includes personalized support from designated clinical staff supervised by her physician, including individualized plan of care and coordination with other care providers 2. 24/7 contact phone numbers for assistance for urgent and routine care needs. 3. Service will only be billed when office clinical staff spend 20 minutes or more in a month to coordinate care. 4. Only one practitioner may furnish and bill the service in a calendar month. 5. The patient may stop CCM services at any time (effective at the end of the month) by phone call to the office staff. 6. The patient will be responsible for cost sharing (co-pay) of up to 20% of the service fee (after annual deductible is met).  Patient agreed to services and verbal consent obtained.   Review of patient status, including review of consultants reports, laboratory and other test data, was performed as part of comprehensive evaluation and provision of chronic care management services.   SDOH (Social Determinants of Health) assessments and interventions performed:  SDOH Interventions     Most Recent Value  SDOH Interventions  Financial Strain Interventions Intervention Not Indicated       Objective:  Lab Results  Component Value Date   CREATININE 0.77 01/02/2020   CREATININE 0.80 01/23/2015   CREATININE 0.8 04/24/2014    Lab Results  Component Value Date   HGBA1C 6.5 (H) 04/28/2019       Component Value Date/Time   CHOL 193  04/28/2019 0815   TRIG 66 04/28/2019 0815   HDL 67 04/28/2019 0815   CHOLHDL 2.9 04/28/2019 0815   CHOLHDL 4.0 03/01/2018 1534   VLDL 25.2 01/23/2015 1317   [place] 114 (H) 04/28/2019 0815   LDLCALC 151 (H) 03/01/2018 1534    Clinical ASCVD: No  The 10-year ASCVD risk score Mikey Bussing DC Jr., et al., 2013) is: 17.6%   Values used to calculate the score:     Age: 64 years     Sex: Female     Is Non-Hispanic African American: Yes     Diabetic: Yes     Tobacco smoker: Yes     Systolic Blood Pressure: 628 mmHg     Is BP treated: No     HDL Cholesterol: 67 mg/dL     Total Cholesterol: 193 mg/dL    BP Readings from Last 3 Encounters:  01/02/20 118/78  09/27/19 110/78  05/23/19 133/88    Allergies  Allergen Reactions  . Hydrocodone Other (See Comments)    Sweating, lethargy, dizzy  . Lipitor [Atorvastatin Calcium]     Myalgias, joint pain    . Milk-Related Compounds     GI upset  . Oxycodone Other (See Comments)    Sweating, lethargy  . Adhesive [Tape] Rash    Band aids    Medications Reviewed Today    Reviewed by Filbert Berthold, CMA (Certified Medical Assistant) on 01/02/20 at 1340  Med List Status: <None>  Medication Order Taking? Sig Documenting Provider Last Dose Status Informant  albuterol (PROAIR HFA) 108 (90 Base)  MCG/ACT inhaler [id number] Yes Inhale 1-2 puffs into the lungs every 6 (six) hours as needed for wheezing or shortness of breath. McLean-Scocuzza, Nino Glow, MD Taking Active   azaTHIOprine (IMURAN) 50 MG tablet [id number] Yes Take 100 mg by mouth 2 (two) times daily.  [provider] Taking Active         Discontinued 01/02/20 1339 (Completed Course)   Blood Glucose Monitoring Suppl (ONE TOUCH ULTRA SYSTEM KIT) W/DEVICE KIT [id number] Yes Test blood sugar as directed Dx S97.5 Lucille Passy, MD Taking Active   calcium-vitamin D (OSCAL WITH D) 500-200 MG-UNIT per tablet [id number] Yes Take 1 tablet by mouth. [provider] Taking Active     celecoxib (CELEBREX) 100 MG capsule [id number] Yes Take 100-200 mg by mouth.  [provider] Taking Active   Cholecalciferol (VITAMIN D3) 5000 units TABS [id number] Yes Take by mouth.  [provider] Taking Active   diclofenac sodium (VOLTAREN) 1 % GEL [id number] Yes Apply topically 4 (four) times daily. [provider] Taking Active   glucose blood test strip [id number] Yes Use as instructed Lucille Passy, MD Taking Active   hydroxychloroquine (PLAQUENIL) 200 MG tablet [id number] Yes Take 200 mg by mouth 2 (two) times daily.  [provider] Taking Active            Med Note Delsa Sale, [place] S   Thu Sep 06, 2015 12:00 PM) Received from: External Pharmacy  ibuprofen (ADVIL) 800 MG tablet [id number] Yes Take 1 tablet (800 mg total) by mouth every 8 (eight) hours as needed. [name], DPM Taking Active        Patient not taking:      Discontinued 01/02/20 1339 (Patient Preference)        Patient not taking:      Discontinued 74/73/40 3709 (Duplicate)   lactulose (CHRONULAC) 10 GM/15ML solution [id number] Yes TAKE 30 ML BY MOUTH 4 TIMES DAILY AS NEEDED  Patient taking differently: Take 30 g by mouth. 50-60 g 1x per day not 30 g qid   Lucille Passy, MD Taking Active   lidocaine (LIDODERM) 5 % [id number] Yes Place 1 patch onto the skin daily. Remove & Discard patch within 12 hours or as directed by MD Copland, Frederico Hamman, MD Taking Active   loratadine (CLARITIN) 10 MG tablet [id number] Yes Take 10 mg by mouth every other day as needed for allergies. [provider] Taking Active   Magnesium Oxide 500 MG TABS [id number] Yes Take by mouth. [provider] Taking Active   metFORMIN (GLUCOPHAGE) 500 MG tablet [id number] Yes TAKE 1 TABLET (500 MG TOTAL) BY MOUTH DAILY WITH BREAKFAST. McLean-Scocuzza, Nino Glow, MD Taking Active         Discontinued 01/02/20 1339 (No longer needed (for PRN medications))         Discontinued 01/02/20 1339 (Completed Course)         Patient taking differently:      Discontinued 01/02/20 1339 (Completed Course)         Discontinued 01/02/20 1339 (Change in therapy)   spironolactone (ALDACTONE) 50 MG tablet [id number] Yes TAKE 1 TABLET BY MOUTH EVERY DAY  Patient taking differently: Take 50 mg by mouth. Biweekly   Lucille Passy, MD Taking Active   traMADol Veatrice Bourbon) 50 MG tablet [id number] Yes TAKE 1 TABLET BY MOUTH EVERY 8 HOURS AS NEEDED  Patient taking differently: Take 100 mg by mouth daily as needed.    Lucille Passy, MD Taking Active  traMADol (ULTRAM) 50 MG tablet [id number] Yes Take 1 tablet (50 mg total) by mouth every 6 (six) hours as needed. [name], DPM Taking Active   vitamin B-12 (CYANOCOBALAMIN) 100 MCG tablet [id number] Yes Take 100 mcg by mouth daily. [provider] Taking Active            Assessment:   Goals Addressed              This Visit's Progress     Patient Stated   .  PharmD "I need help keeping everything together" (pt-stated)        CARE PLAN ENTRY (see longitudinal plan of care for additional care plan information)  Current Barriers:  . Social, financial, community barriers:  o Reports historical concerns communicating between her difference medical care team members.  o Reports stomach upset/diarrhea today, though taking lactulose no more than twice weekly as she is afraid of impact on blood sugars o Brings 2 old meters today, all strips and lancets are expired.  . Polypharmacy; complex patient with multiple comorbidities including SLE, autoimmune hepatitis, liver cirrhosis, steroid-induced DM, OA, osteopenia, asthma  . Most recent eGFR: 92 mL/min o T2DM: reports that she self-titrated metformin to 1000 mg BID, also took her sister's Levemir and titrated to 10 units daily. Checking fastings daily. Readings from 400s to 120s over the past few days. Denies hx of alternative medications for DM. Reports stomach  o SLE: follows w/ [facility] rheumatology. Had planned to  taper prednisone and start Humira, but patient reports today that stopped prednisone d/t fear of hyperglycemia; hydroxychloroquine 200 mg BID, azathioprine 100 mg daily. Plan to start Humira once arrived from speciality pharmacy. Celecoxib 100-200 mg PRN  Pharmacist Clinical Goal(s):  Marland Kitchen Over the next 90 days, patient will work with PharmD and provider towards optimized medication management  Interventions: . Comprehensive medication review performed; medication list updated in electronic medical record . Inter-disciplinary care team collaboration (see longitudinal plan of care) . Discussed importance of communicating with healthcare team with questions/concerns, prior to self-adjusting and initiating medications. She verbalized understanding . Agree to continue metformin 1000 mg BID. Collaborated w/ NP to change to extended release, as patient reporting GI upset.  Nash Dimmer w/ NP for new script for glucometer, strips, and lancets.  . Discussed adjunctive agents to metformin. Discussed GLP1 and SGLT2, but concerned that side effects (GI, GU, etc) related to these medications may be clouded by other uncontrolled conditions at this time. Recommend continuing basal insulin, and transition to non-insulin agent once patient's comorbid conditions are better controlled . Recommend change to longer-lasting basal insulin. Start Tresiba 10 units daily  . Will place referral for RN CM for care coordination and disease self-management education  Patient Self Care Activities:  . Patient will take medications as prescribed  Initial goal documentation        Plan: - Scheduled f/u call in ~3 weeks  Catie Darnelle Maffucci, PharmD, [place], CPP Clinical Pharmacist [place] [phone]

## 2020-01-04 ENCOUNTER — Telehealth: Payer: Self-pay | Admitting: Pharmacist

## 2020-01-04 ENCOUNTER — Other Ambulatory Visit: Payer: Self-pay | Admitting: *Deleted

## 2020-01-04 ENCOUNTER — Encounter: Payer: Self-pay | Admitting: Nurse Practitioner

## 2020-01-04 NOTE — Assessment & Plan Note (Signed)
Epic Chat with Dr. Dwyane Dee and her recommends Cortisol- spot check since she ahs been off of her prednisone for >15 days.  Orthostatic vital signs are normal today.  Patient has no evidence of tachyarrhythmia, hypotension, and seems to be doing all right.  She does report increase of chronic abdominal pain which may be a symptom of steroid reduction.  Her abdominal exam however is unremarkable.  We will get her into see endocrinology as soon as possible.

## 2020-01-04 NOTE — Telephone Encounter (Signed)
°  Chronic Care Management   Note  01/04/2020 Name: AOWYN ROZEBOOM MRN: [id number] DOB: 02/11/1956  Received voicemail from patient yesterday inquiring about her lab results and asking if she should restart prednisone. I can see that she discussed this with CMA yesterday after she had called me.   Called patient, verbalized that she can call back if further questions. Also let her know that [place] was assigned to her and to expect a call.   Catie Darnelle Maffucci, PharmD, [place], CPP Clinical Pharmacist [place] [phone]

## 2020-01-04 NOTE — Assessment & Plan Note (Addendum)
Continue metformin 1000 mg twice daily.   Start Tyler Aas (long acting insulin) 10 units daily. We will discuss your blood sugar at our next call to decide if we need to tweak that dose. If you have episodes of LOW blood sugar, please call us here at the clinic or call Catie directly for instruction [phone]).   Continue to monitor FBS daily.

## 2020-01-04 NOTE — Patient Outreach (Addendum)
[facility]) Care Management  01/04/2020  Shannon [name] 1956-04-28 [id number]    Referral Date: 8/24 Referral Source: [facility] pharmacist/MD office Referral Reason: RN CM care coordination, disease self-management educaiton   Insurance: [facility]   Outreach attempt #1, successful.  Identity verified.  This care manager introduced self and stated purpose of call.  [facility] care management services explained.    Social: Member report living with her daughter and son in law, independent in all ADLs.    Conditions: Per chart, has history of asthma, autoimmune hepatitis, liver cirrhosis, diabetes, adrenal insufficiency, OA, lupus, fibromyalgia, and HLD.  Report blood sugar today was 168, usually in the 100's.  During assessment, member report she has been doing well.  Admits that she stopped taking her steroids and started having some issues from that, has been "prety well" recently.  She has a call out to her providers office about restarting.  She expresses appreciation regarding call but state she does not feel there are any needs at this time.  State she is active with her PCP, rheumatologist, and endocrinologist.  She denies need for help with management of chronic conditions and education but agrees to contact this care manager if needs change.  Plan: RN CM will send outreach letter with this care manager's contact information.  Will not open case at this time.  Valente David, [place], MSN [place] [phone]

## 2020-01-05 ENCOUNTER — Telehealth: Payer: Self-pay | Admitting: Nurse Practitioner

## 2020-01-05 NOTE — Telephone Encounter (Signed)
Please call her for update on prednisone recs by her RHEUM. Was she instructed to remain on it?

## 2020-01-06 NOTE — Telephone Encounter (Signed)
Spoke with patient and she did call [facility] but she had to leave a message and they have not called her back yet. She will let us know when she talks to them but she has taken her 5mg  prednisone the past 3 days and feels a lot better with that.

## 2020-01-12 DIAGNOSIS — Z9225 Personal history of immunosupression therapy: Secondary | ICD-10-CM | POA: Diagnosis not present

## 2020-01-12 DIAGNOSIS — B078 Other viral warts: Secondary | ICD-10-CM | POA: Diagnosis not present

## 2020-01-12 DIAGNOSIS — D492 Neoplasm of unspecified behavior of bone, soft tissue, and skin: Secondary | ICD-10-CM | POA: Diagnosis not present

## 2020-01-20 ENCOUNTER — Other Ambulatory Visit: Payer: Self-pay | Admitting: Nurse Practitioner

## 2020-01-20 ENCOUNTER — Ambulatory Visit (INDEPENDENT_AMBULATORY_CARE_PROVIDER_SITE_OTHER): Payer: Medicare Other | Admitting: Pharmacist

## 2020-01-20 DIAGNOSIS — K754 Autoimmune hepatitis: Secondary | ICD-10-CM

## 2020-01-20 DIAGNOSIS — E119 Type 2 diabetes mellitus without complications: Secondary | ICD-10-CM

## 2020-01-20 DIAGNOSIS — K7460 Unspecified cirrhosis of liver: Secondary | ICD-10-CM

## 2020-01-20 DIAGNOSIS — J4520 Mild intermittent asthma, uncomplicated: Secondary | ICD-10-CM

## 2020-01-20 NOTE — Chronic Care Management (AMB) (Signed)
Chronic Care Management   Follow Up Note   01/20/2020 Name: Shannon [name] MRN: [id number] DOB: 1955-06-01  Referred by: McLean-Scocuzza, Nino Glow, MD Reason for referral : Chronic Care Management (Medication Management)   [name] is a 64 y.o. year old female who is a primary care patient of McLean-Scocuzza, Nino Glow, MD. The CCM team was consulted for assistance with chronic disease management and care coordination needs.    Contacted patient for medication management review.   Review of patient status, including review of consultants reports, relevant laboratory and other test results, and collaboration with appropriate care team members and the patient's provider was performed as part of comprehensive patient evaluation and provision of chronic care management services.    SDOH (Social Determinants of Health) assessments performed: Yes See Care Plan activities for detailed interventions related to SDOH)  SDOH Interventions     Most Recent Value  SDOH Interventions  Financial Strain Interventions Intervention Not Indicated       Outpatient Encounter Medications as of 01/20/2020  Medication Sig   Adalimumab (HUMIRA) 40 MG/0.4ML PSKT Inject 40 mg into the skin every 14 (fourteen) days.    albuterol (PROAIR HFA) 108 (90 Base) MCG/ACT inhaler Inhale 1-2 puffs into the lungs every 6 (six) hours as needed for wheezing or shortness of breath.   azaTHIOprine (IMURAN) 50 MG tablet Take 100 mg by mouth 2 (two) times daily.    blood glucose meter kit and supplies KIT Dispense based on patient and insurance preference. Use up to four times daily as directed. (FOR ICD-9 250.00, 250.01).   blood glucose meter kit and supplies Dispense based on patient and insurance preference. Use up to four times daily as directed. (FOR ICD-10 E10.9, E11.9).   Blood Glucose Monitoring Suppl (ONE TOUCH ULTRA SYSTEM KIT) W/DEVICE KIT Test blood sugar as directed Dx E09.9   calcium-vitamin D  (OSCAL WITH D) 500-200 MG-UNIT per tablet Take 1 tablet by mouth.   celecoxib (CELEBREX) 100 MG capsule Take 100-200 mg by mouth.    Cholecalciferol (VITAMIN D3) 5000 units TABS Take by mouth.    diclofenac sodium (VOLTAREN) 1 % GEL Apply topically 4 (four) times daily.   hydroxychloroquine (PLAQUENIL) 200 MG tablet Take 200 mg by mouth 2 (two) times daily.    insulin degludec (TRESIBA) 100 UNIT/ML FlexTouch Pen Inject 0.1 mLs (10 Units total) into the skin daily.   Insulin Pen Needle (PEN NEEDLES) 31G X 6 MM MISC Use with Tyler Aas as instructed.   lactulose (CHRONULAC) 10 GM/15ML solution TAKE 30 ML BY MOUTH 4 TIMES DAILY AS NEEDED (Patient taking differently: Take 30 g by mouth. 50-60 g 1x per day not 30 g qid)   lidocaine (LIDODERM) 5 % Place 1 patch onto the skin daily. Remove & Discard patch within 12 hours or as directed by MD   loratadine (CLARITIN) 10 MG tablet Take 10 mg by mouth every other day as needed for allergies.   Magnesium Oxide 500 MG TABS Take by mouth.   metFORMIN (GLUCOPHAGE-XR) 500 MG 24 hr tablet Take 2 tablets (1,000 mg total) by mouth in the morning and at bedtime.   ONETOUCH VERIO test strip TEST UP TO 4 TIMES DAILY   predniSONE (DELTASONE) 5 MG tablet Take 5 mg by mouth daily with breakfast.   spironolactone (ALDACTONE) 50 MG tablet TAKE 1 TABLET BY MOUTH EVERY DAY (Patient taking differently: Take 50 mg by mouth. Biweekly)   traMADol (ULTRAM) 50 MG tablet TAKE 1 TABLET BY  MOUTH EVERY 8 HOURS AS NEEDED (Patient taking differently: Take 100 mg by mouth daily as needed. )   vitamin B-12 (CYANOCOBALAMIN) 100 MCG tablet Take 100 mcg by mouth daily.   [DISCONTINUED] traMADol (ULTRAM) 50 MG tablet Take 1 tablet (50 mg total) by mouth every 6 (six) hours as needed.   [DISCONTINUED] ibuprofen (ADVIL) 800 MG tablet Take 1 tablet (800 mg total) by mouth every 8 (eight) hours as needed. (Patient not taking: Reported on 01/20/2020)   No facility-administered  encounter medications on file as of 01/20/2020.     Objective:   Goals Addressed              This Visit's Progress     Patient Stated     PharmD "I need help keeping everything together" (pt-stated)        CARE PLAN ENTRY (see longitudinal plan of care for additional care plan information)  Current Barriers:   Social, financial, community barriers:  o Reports historical concerns communicating between her difference medical care team members. However, reports today that she feels things are better in control  Polypharmacy; complex patient with multiple comorbidities including SLE, autoimmune hepatitis, liver cirrhosis, steroid-induced DM, OA, osteopenia, asthma   Most recent eGFR: 92 mL/min  T2DM: metformin 1000 mg BID, Tresiba 10 units daily.  o Fasting glucose: 80-100s o Denies any episodes of hypoglycemia  SLE: follows w/ [facility] rheumatology. Prednisone 5 mg daily, hydroxychloroquine 200 mg BID, azathioprine 100 mg BID. She notes that she is getting ready to start on Humira; there were financial barriers that have sense been rectified. She is awaiting shipment. Reports good benefit w/ celecoxib 100-200 mg PRN, this has reduced her need for PRN tramadol.   Cirrhosis: lactulose scheduled. Patient reports that she uses ~every 3 days. Wonders about the referral for GI that was placed, notes she never heard anything back.   Osteopenia (long term use of prednisone): DEXA in 2021, t score -0.7. FRAX calculates to MOP 6%, hip fracture 0.6%. Calcium + Vitamin D supplementation sufficient  Pharmacist Clinical Goal(s):   Over the next 90 days, patient will work with PharmD and provider towards optimized medication management  Interventions:  Comprehensive medication review performed; medication list updated in electronic medical record  Inter-disciplinary care team collaboration (see longitudinal plan of care)  Reviewed goal A1c, goal fasting, and goal 2 hour post prandial  glucose. Encouraged continued adherence to metformin 1000 mg BID, Tresiba 10 units daily. Counseled to contact me or the clinic if she starts to develop hypoglycemia (particularly if Humira initiation allows for prednisone reduction)  Collaboration w/ referral coordinator; reviewed that upcoming [facility] appt is the GI referral that was placed. Patient verbalizes understanding and appreciation  Counseled to avoid ibuprofen and other NSAIDS w/ celecoxib.   Patient Self Care Activities:   Patient will take medications as prescribed  Patient will notify provider with any episodes of hypoglyceima  Please see past updates related to this goal by clicking on the "Past Updates" button in the selected goal          Plan:  - Scheduled f/u call in ~ 5-6 weeks  Catie Darnelle Maffucci, PharmD, [place], [place] Pharmacist [place] [phone]

## 2020-01-20 NOTE — Patient Instructions (Signed)
Visit Information  Goals Addressed              This Visit's Progress     Patient Stated   .  PharmD "I need help keeping everything together" (pt-stated)        CARE PLAN ENTRY (see longitudinal plan of care for additional care plan information)  Current Barriers:  . Social, financial, community barriers:  o Reports historical concerns communicating between her difference medical care team members. However, reports today that she feels things are better in control . Polypharmacy; complex patient with multiple comorbidities including SLE, autoimmune hepatitis, liver cirrhosis, steroid-induced DM, OA, osteopenia, asthma  . Most recent eGFR: 92 mL/min . T2DM: metformin 1000 mg BID, Tresiba 10 units daily.  o Fasting glucose: 80-100s o Denies any episodes of hypoglycemia . SLE: follows w/ [facility] rheumatology. Prednisone 5 mg daily, hydroxychloroquine 200 mg BID, azathioprine 100 mg BID. She notes that she is getting ready to start on Humira; there were financial barriers that have sense been rectified. She is awaiting shipment. Reports good benefit w/ celecoxib 100-200 mg PRN, this has reduced her need for PRN tramadol.  . Cirrhosis: lactulose scheduled. Patient reports that she uses ~every 3 days. Wonders about the referral for GI that was placed, notes she never heard anything back.  . Osteopenia (long term use of prednisone): DEXA in 2021, t score -0.7. FRAX calculates to MOP 6%, hip fracture 0.6%. Calcium + Vitamin D supplementation sufficient  Pharmacist Clinical Goal(s):  Marland Kitchen Over the next 90 days, patient will work with PharmD and provider towards optimized medication management  Interventions: . Comprehensive medication review performed; medication list updated in electronic medical record . Inter-disciplinary care team collaboration (see longitudinal plan of care) . Reviewed goal A1c, goal fasting, and goal 2 hour post prandial glucose. Encouraged continued adherence to metformin  1000 mg BID, Tresiba 10 units daily. Counseled to contact me or the clinic if she starts to develop hypoglycemia (particularly if Humira initiation allows for prednisone reduction) . Collaboration w/ referral coordinator; reviewed that upcoming [place] clinic appt is the GI referral that was placed. Patient verbalizes understanding and appreciation . Counseled to avoid ibuprofen and other NSAIDS w/ celecoxib.   Patient Self Care Activities:  . Patient will take medications as prescribed . Patient will notify provider with any episodes of hypoglyceima  Please see past updates related to this goal by clicking on the "Past Updates" button in the selected goal         The patient verbalized understanding of instructions provided today and declined a print copy of patient instruction materials.      Plan:  - Scheduled f/u call in ~ 5-6 weeks  Catie Darnelle Maffucci, PharmD, [place], [place] Pharmacist [place] [phone]

## 2020-01-27 DIAGNOSIS — M6520 Calcific tendinitis, unspecified site: Secondary | ICD-10-CM | POA: Diagnosis not present

## 2020-01-27 DIAGNOSIS — M25511 Pain in right shoulder: Secondary | ICD-10-CM | POA: Diagnosis not present

## 2020-02-03 ENCOUNTER — Telehealth: Payer: Self-pay | Admitting: Internal Medicine

## 2020-02-03 DIAGNOSIS — D72819 Decreased white blood cell count, unspecified: Secondary | ICD-10-CM | POA: Diagnosis not present

## 2020-02-03 DIAGNOSIS — K754 Autoimmune hepatitis: Secondary | ICD-10-CM | POA: Diagnosis not present

## 2020-02-03 NOTE — Telephone Encounter (Signed)
Liver enzymes elevated AST 40 and ALT 55 f/u with GI  Cholesterol ok   Blood cts normal except red blood cell size elevated  -any alcohol I dont think so but checking  We need to call this lab [phone] and add on A1C ordered by me 09/27/19 needed for referral   -or was a1C done please call [facility]

## 2020-02-03 NOTE — Telephone Encounter (Signed)
Faxed to [place] referral form to [facility] at [facility] for  Up to date A1c faxed on 02-03-20

## 2020-02-03 NOTE — Telephone Encounter (Signed)
Patient calling in as she states she is currently at [facility] to have labs done. States that she spoke with someone from our office a few days ago who told her the orders were in.   Informed the Patient that orders had been placed 09/2019. Informed her there was no documentation of anyone form our office calling her since she spoke with Catie 01/20/20.    Patient states she was informed that Endo would not schedule her until she had labs done. Patient states it may have been Endo that called her and not our office.   Patient got the fax number for the [facility], [phone], and I have faxed over the signed orders. Received confirmation that this has gone through.

## 2020-02-03 NOTE — Telephone Encounter (Signed)
A1C came later 7.8 needs referral with copy of labs to [facility] Dawson Bills prev referred  Lab copy in Arianna s box   Thank you

## 2020-02-06 NOTE — Telephone Encounter (Signed)
Patient informed and verbalized understanding.   States she has an appointment with GI in October and with Endo in December.   Patient has not been drinking

## 2020-02-06 NOTE — Telephone Encounter (Signed)
Received and fax to [facility]

## 2020-02-13 ENCOUNTER — Other Ambulatory Visit: Payer: Self-pay | Admitting: Internal Medicine

## 2020-02-13 DIAGNOSIS — E119 Type 2 diabetes mellitus without complications: Secondary | ICD-10-CM

## 2020-02-13 MED ORDER — METFORMIN HCL ER 500 MG PO TB24: 500.0000 mg | ORAL_TABLET | Freq: Two times a day (BID) | ORAL | 3 refills | Status: DC

## 2020-02-23 DIAGNOSIS — M25511 Pain in right shoulder: Secondary | ICD-10-CM | POA: Diagnosis not present

## 2020-02-23 DIAGNOSIS — M545 Low back pain, unspecified: Secondary | ICD-10-CM | POA: Diagnosis not present

## 2020-02-23 DIAGNOSIS — M25579 Pain in unspecified ankle and joints of unspecified foot: Secondary | ICD-10-CM | POA: Diagnosis not present

## 2020-02-23 DIAGNOSIS — M7581 Other shoulder lesions, right shoulder: Secondary | ICD-10-CM | POA: Diagnosis not present

## 2020-02-23 DIAGNOSIS — M461 Sacroiliitis, not elsewhere classified: Secondary | ICD-10-CM | POA: Diagnosis not present

## 2020-02-23 DIAGNOSIS — S46811A Strain of other muscles, fascia and tendons at shoulder and upper arm level, right arm, initial encounter: Secondary | ICD-10-CM | POA: Diagnosis not present

## 2020-02-28 ENCOUNTER — Other Ambulatory Visit: Payer: Self-pay

## 2020-02-28 DIAGNOSIS — E119 Type 2 diabetes mellitus without complications: Secondary | ICD-10-CM

## 2020-02-28 MED ORDER — METFORMIN HCL ER 500 MG PO TB24: 500.0000 mg | ORAL_TABLET | Freq: Two times a day (BID) | ORAL | 3 refills | Status: DC

## 2020-03-01 DIAGNOSIS — Z9225 Personal history of immunosupression therapy: Secondary | ICD-10-CM | POA: Diagnosis not present

## 2020-03-01 DIAGNOSIS — B079 Viral wart, unspecified: Secondary | ICD-10-CM | POA: Diagnosis not present

## 2020-03-01 DIAGNOSIS — M5136 Other intervertebral disc degeneration, lumbar region: Secondary | ICD-10-CM | POA: Diagnosis not present

## 2020-03-02 ENCOUNTER — Telehealth: Payer: Medicare Other

## 2020-03-02 ENCOUNTER — Telehealth: Payer: Self-pay | Admitting: Pharmacist

## 2020-03-02 DIAGNOSIS — K754 Autoimmune hepatitis: Secondary | ICD-10-CM | POA: Diagnosis not present

## 2020-03-02 DIAGNOSIS — R140 Abdominal distension (gaseous): Secondary | ICD-10-CM | POA: Diagnosis not present

## 2020-03-02 DIAGNOSIS — K862 Cyst of pancreas: Secondary | ICD-10-CM | POA: Insufficient documentation

## 2020-03-02 DIAGNOSIS — K7460 Unspecified cirrhosis of liver: Secondary | ICD-10-CM | POA: Diagnosis not present

## 2020-03-02 NOTE — Progress Notes (Signed)
°  Chronic Care Management   Note  03/02/2020 Name: Shannon [name] MRN: [id number] DOB: 06-07-1955   Attempted to contact patient for scheduled appointment for medication management support. Left HIPAA compliant message for patient to return my call at their convenience.    Plan: - If I do not hear back from the patient by end of business today, will collaborate with Care Guide to outreach to schedule follow up with me  Catie Darnelle Maffucci, PharmD, [place], [place] Pharmacist [place] [phone]

## 2020-03-06 NOTE — Telephone Encounter (Signed)
Pt has been r/s  

## 2020-03-21 DIAGNOSIS — M7531 Calcific tendinitis of right shoulder: Secondary | ICD-10-CM | POA: Diagnosis not present

## 2020-03-21 DIAGNOSIS — M19019 Primary osteoarthritis, unspecified shoulder: Secondary | ICD-10-CM | POA: Diagnosis not present

## 2020-03-22 ENCOUNTER — Other Ambulatory Visit: Payer: Self-pay

## 2020-03-22 DIAGNOSIS — E119 Type 2 diabetes mellitus without complications: Secondary | ICD-10-CM

## 2020-03-22 MED ORDER — METFORMIN HCL ER 500 MG PO TB24: 500.0000 mg | ORAL_TABLET | Freq: Two times a day (BID) | ORAL | 3 refills | Status: DC

## 2020-03-23 DIAGNOSIS — K8020 Calculus of gallbladder without cholecystitis without obstruction: Secondary | ICD-10-CM | POA: Diagnosis not present

## 2020-03-23 DIAGNOSIS — K7460 Unspecified cirrhosis of liver: Secondary | ICD-10-CM | POA: Diagnosis not present

## 2020-03-27 ENCOUNTER — Telehealth: Payer: Self-pay

## 2020-03-27 NOTE — Telephone Encounter (Signed)
Has she been taking 10 units tresiba daily? Any lows <70 ? What has sugar been ?  How much metformin is she taking?

## 2020-03-27 NOTE — Telephone Encounter (Signed)
Last filled by Dawson Bills. Okay to fill [place]?

## 2020-03-29 DIAGNOSIS — R109 Unspecified abdominal pain: Secondary | ICD-10-CM | POA: Diagnosis not present

## 2020-03-29 DIAGNOSIS — K862 Cyst of pancreas: Secondary | ICD-10-CM | POA: Diagnosis not present

## 2020-03-29 DIAGNOSIS — K8020 Calculus of gallbladder without cholecystitis without obstruction: Secondary | ICD-10-CM | POA: Diagnosis not present

## 2020-04-03 ENCOUNTER — Other Ambulatory Visit: Payer: Self-pay

## 2020-04-03 DIAGNOSIS — E119 Type 2 diabetes mellitus without complications: Secondary | ICD-10-CM

## 2020-04-03 MED ORDER — METFORMIN HCL ER 500 MG PO TB24: 1000.0000 mg | ORAL_TABLET | Freq: Two times a day (BID) | ORAL | 3 refills | Status: DC

## 2020-04-03 NOTE — Telephone Encounter (Signed)
Patient is taking 10 units and has not had any lows less than 70.   Patient states her sugars have mostly been 92-107 at night.   Metformin 500 mg 2 tablets in the morning and at night.

## 2020-04-04 ENCOUNTER — Other Ambulatory Visit: Payer: Self-pay | Admitting: Internal Medicine

## 2020-04-04 DIAGNOSIS — E119 Type 2 diabetes mellitus without complications: Secondary | ICD-10-CM

## 2020-04-04 MED ORDER — TRESIBA FLEXTOUCH 100 UNIT/ML ~~LOC~~ SOPN: 10.0000 [IU] | PEN_INJECTOR | Freq: Every day | SUBCUTANEOUS | 3 refills | Status: DC

## 2020-04-04 MED ORDER — PEN NEEDLES 31G X 6 MM MISC
3 refills | Status: DC
Start: 2020-04-04 — End: 2020-09-13

## 2020-04-04 MED ORDER — METFORMIN HCL ER 500 MG PO TB24: 1000.0000 mg | ORAL_TABLET | Freq: Two times a day (BID) | ORAL | 3 refills | Status: DC

## 2020-04-09 DIAGNOSIS — M461 Sacroiliitis, not elsewhere classified: Secondary | ICD-10-CM | POA: Diagnosis not present

## 2020-04-09 NOTE — Telephone Encounter (Signed)
Rxs were sent to [facility] 04/04/20

## 2020-04-11 ENCOUNTER — Telehealth (INDEPENDENT_AMBULATORY_CARE_PROVIDER_SITE_OTHER): Payer: Medicare Other | Admitting: Internal Medicine

## 2020-04-11 ENCOUNTER — Encounter: Payer: Self-pay | Admitting: Internal Medicine

## 2020-04-11 ENCOUNTER — Other Ambulatory Visit: Payer: Self-pay

## 2020-04-11 VITALS — BP 128/81 | Ht 66.0 in | Wt 192.0 lb

## 2020-04-11 DIAGNOSIS — K7460 Unspecified cirrhosis of liver: Secondary | ICD-10-CM

## 2020-04-11 DIAGNOSIS — G8929 Other chronic pain: Secondary | ICD-10-CM

## 2020-04-11 DIAGNOSIS — D72819 Decreased white blood cell count, unspecified: Secondary | ICD-10-CM

## 2020-04-11 DIAGNOSIS — M5417 Radiculopathy, lumbosacral region: Secondary | ICD-10-CM | POA: Diagnosis not present

## 2020-04-11 DIAGNOSIS — R748 Abnormal levels of other serum enzymes: Secondary | ICD-10-CM

## 2020-04-11 DIAGNOSIS — M797 Fibromyalgia: Secondary | ICD-10-CM

## 2020-04-11 DIAGNOSIS — M3219 Other organ or system involvement in systemic lupus erythematosus: Secondary | ICD-10-CM

## 2020-04-11 DIAGNOSIS — E099 Drug or chemical induced diabetes mellitus without complications: Secondary | ICD-10-CM | POA: Diagnosis not present

## 2020-04-11 DIAGNOSIS — E559 Vitamin D deficiency, unspecified: Secondary | ICD-10-CM | POA: Diagnosis not present

## 2020-04-11 DIAGNOSIS — T380X5D Adverse effect of glucocorticoids and synthetic analogues, subsequent encounter: Secondary | ICD-10-CM

## 2020-04-11 DIAGNOSIS — K754 Autoimmune hepatitis: Secondary | ICD-10-CM | POA: Diagnosis not present

## 2020-04-11 DIAGNOSIS — E119 Type 2 diabetes mellitus without complications: Secondary | ICD-10-CM

## 2020-04-11 HISTORY — DX: Decreased white blood cell count, unspecified: D72.819

## 2020-04-11 HISTORY — DX: Abnormal levels of other serum enzymes: R74.8

## 2020-04-11 NOTE — Progress Notes (Signed)
Telephone Note  I connected with Shannon [name]  on 04/11/20 at  1:45 PM EST by telephone and verified that I am speaking with the correct person using two identifiers.  Location patient: home, [place] Location provider:work or home office Persons participating in the virtual visit: patient, provider  I discussed the limitations of evaluation and management by telemedicine and the availability of in person appointments. The patient expressed understanding and agreed to proceed.   HPI: 1. DM 2 likely steroid induced A1C 7.8 02/03/20 on metformin 1000 mg bid and tresiba 10 u qd  cbg randing 87 to 112  2. AIH with cirrhosis AST 40 and ALT 53 labs 01/02/20 she stopped Tylenol extra strength and tumeric x 6 months and 2 months due to c/w with lfts elevating b/c of this and iwll f/u [facility] GI and hepatology  She was on 4 mg prednisone qd and taper pending per rheumatology  3. Chronic back pain s/p nerve block and steroid injection with the pain clinic pending and h/o depression due to chronic medical problems and pain declines therapy she also f/[facility] ( also has lupus, fibromyalgia, chronic shoulder and back pain) no longer taking humira and on cosentyx per rheum as humira did not have improvement of pain  4. Leukopenia will follow  5. Wart removed by [facility] to finger   ROS: See pertinent positives and negatives per HPI.  Past Medical History:  Diagnosis Date  . Asthma   . Autoimmune hepatitis ([place])   . Depression   . Diabetes mellitus without complication ([facility])   . DUB (dysfunctional uterine bleeding)    2008 s/p hysterectomy  . Exposure to TB 2005   tx'ed x 6 months  . Gallstones   . Gastric ulcer   . GERD (gastroesophageal reflux disease)   . History of chicken pox   . Hx of migraines   . Liver disease    very low liver function, auto-immune hepatitis  . Motion sickness   . Osteoarthritis   . Systemic lupus erythematosus ([place]) 05/31/2010   Qualifier: Diagnosis of  By: Deborra Medina  MD, Tanja Port    . Vitamin D deficiency     Past Surgical History:  Procedure Laterality Date  . ABDOMINAL HYSTERECTOMY    . BACK SURGERY     L4/5 herniated disc repair 1992   . HALLUX VALGUS AUSTIN Left 02/09/2019   Procedure: HALLUX VALGUS AUSTIN ([place]);  Surgeon: [name], DPM;  Location: [place];  Service: Podiatry;  Laterality: Left;  General with local  . HAMMER TOE SURGERY Left 02/09/2019   Procedure: HAMMER TOE CORRECTION;  Surgeon: [name], DPM;  Location: [place];  Service: Podiatry;  Laterality: Left;  Diabetic - oral meds  . LAPAROSCOPIC HYSTERECTOMY    . TUBAL LIGATION       Current Outpatient Medications:  .  albuterol (PROAIR HFA) 108 (90 Base) MCG/ACT inhaler, Inhale 1-2 puffs into the lungs every 6 (six) hours as needed for wheezing or shortness of breath., Disp: 54 g, Rfl: 3 .  azaTHIOprine (IMURAN) 50 MG tablet, Take 100 mg by mouth 2 (two) times daily. , Disp: , Rfl:  .  blood glucose meter kit and supplies KIT, Dispense based on patient and insurance preference. Use up to four times daily as directed. (FOR ICD-9 250.00, 250.01)., Disp: 1 each, Rfl: 0 .  blood glucose meter kit and supplies, Dispense based on patient and insurance preference. Use up to four times daily as directed. (FOR  ICD-10 E10.9, E11.9)., Disp: 1 each, Rfl: 0 .  Blood Glucose Monitoring Suppl (ONE TOUCH ULTRA SYSTEM KIT) W/DEVICE KIT, Test blood sugar as directed Dx E09.9, Disp: 1 each, Rfl: 0 .  calcium-vitamin D (OSCAL WITH D) 500-200 MG-UNIT per tablet, Take 1 tablet by mouth., Disp: , Rfl:  .  celecoxib (CELEBREX) 100 MG capsule, Take 100-200 mg by mouth. , Disp: , Rfl:  .  Cholecalciferol (VITAMIN D3) 5000 units TABS, Take by mouth. , Disp: , Rfl:  .  hydroxychloroquine (PLAQUENIL) 200 MG tablet, Take 200 mg by mouth 2 (two) times daily. , Disp: , Rfl: 11 .  insulin degludec (TRESIBA FLEXTOUCH) 100 UNIT/ML FlexTouch Pen, Inject 10 Units into the skin daily.,  Disp: 15 mL, Rfl: 3 .  Insulin Pen Needle (PEN NEEDLES) 31G X 6 MM MISC, Use with [place] as instructed daily, Disp: 100 each, Rfl: 3 .  lactulose (CHRONULAC) 10 GM/15ML solution, TAKE 30 ML BY MOUTH 4 TIMES DAILY AS NEEDED (Patient taking differently: Take 30 g by mouth. 50-60 g 1x per day not 30 g qid), Disp: 10800 mL, Rfl: 3 .  lidocaine (LIDODERM) 5 %, Place 1 patch onto the skin daily. Remove & Discard patch within 12 hours or as directed by MD, Disp: 30 patch, Rfl: 5 .  loratadine (CLARITIN) 10 MG tablet, Take 10 mg by mouth every other day as needed for allergies., Disp: , Rfl:  .  Magnesium Oxide 500 MG TABS, Take by mouth., Disp: , Rfl:  .  metFORMIN (GLUCOPHAGE-XR) 500 MG 24 hr tablet, Take 2 tablets (1,000 mg total) by mouth 2 (two) times daily with a meal., Disp: 360 tablet, Rfl: 3 .  predniSONE (DELTASONE) 5 MG tablet, Take 5 mg by mouth daily with breakfast., Disp: , Rfl:  .  spironolactone (ALDACTONE) 50 MG tablet, TAKE 1 TABLET BY MOUTH EVERY DAY (Patient taking differently: Take 50 mg by mouth. Biweekly), Disp: 90 tablet, Rfl: 1 .  traMADol (ULTRAM) 50 MG tablet, TAKE 1 TABLET BY MOUTH EVERY 8 HOURS AS NEEDED (Patient taking differently: Take 100 mg by mouth daily as needed. ), Disp: 60 tablet, Rfl: 0 .  vitamin B-12 (CYANOCOBALAMIN) 100 MCG tablet, Take 100 mcg by mouth daily., Disp: , Rfl:  .  Adalimumab (HUMIRA) 40 MG/0.4ML PSKT, Inject 40 mg into the skin every 14 (fourteen) days.  (Patient not taking: Reported on 04/11/2020), Disp: , Rfl:  .  diclofenac sodium (VOLTAREN) 1 % GEL, Apply topically 4 (four) times daily. (Patient not taking: Reported on 04/11/2020), Disp: , Rfl:  .  glucose blood (ONETOUCH VERIO) test strip, 1 each by Other route 4 (four) times daily. Use as instructed, Disp: 400 strip, Rfl: 3  EXAM:  VITALS per patient if applicable:  GENERAL: alert, oriented, appears well and in no acute distress  PSYCH/NEURO: pleasant and cooperative, no obvious depression or  anxiety, speech and thought processing grossly intact  ASSESSMENT AND PLAN:  Discussed the following assessment and plan:  Autoimmune hepatitis ([place]) with cirrhosis with elevated lfts Systemic lupus erythematosus with other organ involvement, unspecified SLE type ([place]) taperin off prednisone 4 mg qd per rheumatology [facility]  F[facility]u [facility]patology   Steroid-induced diabetes mellitus, A1C 7.8 02/03/20- Plan: Comprehensive metabolic panel, Lipid panel, Hemoglobin A1c, Urinalysis, Routine w reflex microscopic, CBC w/Diff On tresiba 10 units qd and metformin xr (500 mg)1000 mg bid  [facility]   Vitamin D deficiency Vit D 11/21/19 40.1   Lumbosacral radiculopathy at L5 Fibromyalgia Chronic  [facility]/steroid [facility]  HM Flu shotwill consider late 2021/early 2022 has not had yet as of 04/11/20 Consider shingrix in future  prevnar had 04/24/14 consider pna 23 in future  Tdap had 11/04/10 Hep A/B immune 1/2 moderna refuses 2nd dose   rech healthy diet and smoking cessation  Pap had 03/31/07 cant exclude HSIL s/p hysterectomy for DUB in 2008 given history rec pt to think about it -pap 03/25/2019 negative   Mammogram pt declined on multiple occasionsdeclined today 03/24/2019 and declined 09/27/19, 04/11/20   DEXA 10/05/15 normal, 10/06/17 lumbar and femur neck osteopenia Vitamin D 22.8 07/13/17-rec D3 5000 IU qd  Colonoscopy 07/08/16 diverticulosis, polpys x 3 hyperplastic, IH [facility] q 10 years  EGD 10/30/17 mild portal hypertensive gastropathy in gastric body  HCV neg 03/19/10, hep B sAg neg 03/19/10  declines HIV  Smoking x 40 years 1 pk lasts 2 days max 1 ppd FH mom lung was smoker -rec cessation  [place] [facility] saw 06/22/19 no toxic maculopathy, ocp normal f/u hydroxychloroquine yearly Dr. Edison Pace  -we discussed possible serious and likely etiologies, options for evaluation and workup, limitations of telemedicine visit vs in person visit,  treatment, treatment risks and precautions.   I discussed the assessment and treatment plan with the patient. The patient was provided an opportunity to ask questions and all were answered. The patient agreed with the plan and demonstrated an understanding of the instructions.    Time spent 20 min Delorise Jackson, MD

## 2020-04-16 DIAGNOSIS — M461 Sacroiliitis, not elsewhere classified: Secondary | ICD-10-CM | POA: Diagnosis not present

## 2020-04-23 ENCOUNTER — Telehealth: Payer: Medicare Other

## 2020-04-23 DIAGNOSIS — E0800 Diabetes mellitus due to underlying condition with hyperosmolarity without nonketotic hyperglycemic-hyperosmolar coma (NKHHC): Secondary | ICD-10-CM | POA: Diagnosis not present

## 2020-04-23 DIAGNOSIS — K7460 Unspecified cirrhosis of liver: Secondary | ICD-10-CM | POA: Diagnosis not present

## 2020-04-30 DIAGNOSIS — M792 Neuralgia and neuritis, unspecified: Secondary | ICD-10-CM | POA: Diagnosis not present

## 2020-05-07 ENCOUNTER — Telehealth: Payer: Self-pay

## 2020-05-07 DIAGNOSIS — Z885 Allergy status to narcotic agent status: Secondary | ICD-10-CM | POA: Diagnosis not present

## 2020-05-07 DIAGNOSIS — M329 Systemic lupus erythematosus, unspecified: Secondary | ICD-10-CM | POA: Diagnosis not present

## 2020-05-07 DIAGNOSIS — K754 Autoimmune hepatitis: Secondary | ICD-10-CM | POA: Diagnosis not present

## 2020-05-07 DIAGNOSIS — M76811 Anterior tibial syndrome, right leg: Secondary | ICD-10-CM | POA: Diagnosis not present

## 2020-05-07 DIAGNOSIS — Z79899 Other long term (current) drug therapy: Secondary | ICD-10-CM | POA: Diagnosis not present

## 2020-05-07 DIAGNOSIS — Z7984 Long term (current) use of oral hypoglycemic drugs: Secondary | ICD-10-CM | POA: Diagnosis not present

## 2020-05-07 DIAGNOSIS — F17210 Nicotine dependence, cigarettes, uncomplicated: Secondary | ICD-10-CM | POA: Diagnosis not present

## 2020-05-07 DIAGNOSIS — M79661 Pain in right lower leg: Secondary | ICD-10-CM | POA: Diagnosis not present

## 2020-05-07 DIAGNOSIS — E119 Type 2 diabetes mellitus without complications: Secondary | ICD-10-CM | POA: Diagnosis not present

## 2020-05-07 DIAGNOSIS — M79604 Pain in right leg: Secondary | ICD-10-CM | POA: Diagnosis not present

## 2020-05-07 DIAGNOSIS — Z7951 Long term (current) use of inhaled steroids: Secondary | ICD-10-CM | POA: Diagnosis not present

## 2020-05-07 DIAGNOSIS — Z888 Allergy status to other drugs, medicaments and biological substances status: Secondary | ICD-10-CM | POA: Diagnosis not present

## 2020-05-07 DIAGNOSIS — F17200 Nicotine dependence, unspecified, uncomplicated: Secondary | ICD-10-CM | POA: Diagnosis not present

## 2020-05-07 DIAGNOSIS — Z91011 Allergy to milk products: Secondary | ICD-10-CM | POA: Diagnosis not present

## 2020-05-07 DIAGNOSIS — Z791 Long term (current) use of non-steroidal anti-inflammatories (NSAID): Secondary | ICD-10-CM | POA: Diagnosis not present

## 2020-05-07 NOTE — Telephone Encounter (Signed)
Pt said she is having swelling in her right lower leg. She said the pain has increased over this past week. I transferred patient to access nurse to be triaged.

## 2020-05-07 NOTE — Telephone Encounter (Signed)
Patient instructed to go to ED by Access nurse.     [facility] Day - Clie TELEPHONE ADVICE RECORD AccessNurse Patient Name: Shannon [name] Gender: Female DOB: 12-02-1955 Age: 64 Y 23 D Return Phone Number: [phone] (Primary) Address: [place] [address] Client [facility] - Clie Client Site [facility] [place] [facility] - Day Physician McClean-Scocuzza, French Ana Contact Type Call Who Is Calling Patient / Member / Family / Caregiver Call Type Triage / Clinical Relationship To Patient Self Return Phone Number [phone] (Primary) Chief Complaint Leg Swelling And Edema Reason for Call Symptomatic / Request for Health Information Initial Comment Caller states she has swelling in her right lower calf . And very painful. GOTO Facility Not Listed [facility] ED Translation No Nurse Assessment Nurse: Rinaldo Ratel, RN, [place] Date/Time Lamount Cohen Time): 05/07/2020 9:18:02 AM Confirm and document reason for call. If symptomatic, describe symptoms. ---Caller reports Rt lower calf pain and swelling x 2 weeks. Pt denies any trauma or injury. Pain 8/10. Does the patient have any new or worsening symptoms? ---Yes Will a triage be completed? ---Yes Related visit to physician within the last 2 weeks? ---No Does the PT have any chronic conditions? (i.e. diabetes, asthma, this includes High risk factors for pregnancy, etc.) ---Yes List chronic conditions. ---Lupus, Arthritis, Autoimmune Hepatitis, DM Is this a behavioral health or substance abuse call? ---No Guidelines Guideline Title Affirmed Question Affirmed Notes Nurse Date/Time Lamount Cohen Time) Leg Swelling and Edema [1] Can't walk or can barely walk AND [2] new-onset Stark Bray, [place] 05/07/2020 9:19:25 AM Disp. Time Lamount Cohen Time) Disposition Final User 05/07/2020 8:57:51 AM Attempt made - no message left Stark Bray, [place] 05/07/2020 9:21:05 AM Go to  ED Now Yes Rinaldo Ratel, RN, [place] Caller Disagree/Comply Comply PLEASE NOTE: All timestamps contained within this report are represented as [place] Standard Time. CONFIDENTIALTY NOTICE: This fax transmission is intended only for the addressee. It contains information that is legally privileged, confidential or otherwise protected from use or disclosure. If you are not the intended recipient, you are strictly prohibited from reviewing, disclosing, copying using or disseminating any of this information or taking any action in reliance on or regarding this information. If you have received this fax in error, please notify us immediately by telephone so that we can arrange for its return to [place]. Phone: [phone], Toll-Free: [phone], Fax: [phone] Page: 2 of 2 Call Id: [id number] Caller Understands Yes PreDisposition Call Doctor Care Advice Given Per Guideline GO TO ED NOW: * You need to be seen in the Emergency Department. * Go to the ED at ___________ Hospital. * Leave now. Drive carefully. BRING MEDICINES: * Bring a list of your current medicines when you go to the Emergency Department (ER). * Bring the pill bottles too. This will help the doctor (or NP/PA) to make certain you are taking the right medicines and the right dose. CARE ADVICE given per Leg Swelling and Edema (Adult) guideline. Comments User: [place], [place], RN Date/Time Lamount Cohen Time): 05/07/2020 8:58:22 AM Caller's voice mail box full- unable to leave message. Referrals GO TO FACILITY OTHER - SPECIFY

## 2020-05-15 ENCOUNTER — Ambulatory Visit: Payer: Medicare Other | Admitting: Pharmacist

## 2020-05-15 ENCOUNTER — Other Ambulatory Visit: Payer: Self-pay

## 2020-05-15 DIAGNOSIS — K754 Autoimmune hepatitis: Secondary | ICD-10-CM

## 2020-05-15 DIAGNOSIS — M797 Fibromyalgia: Secondary | ICD-10-CM

## 2020-05-15 DIAGNOSIS — E119 Type 2 diabetes mellitus without complications: Secondary | ICD-10-CM

## 2020-05-15 DIAGNOSIS — T380X5D Adverse effect of glucocorticoids and synthetic analogues, subsequent encounter: Secondary | ICD-10-CM

## 2020-05-15 DIAGNOSIS — E099 Drug or chemical induced diabetes mellitus without complications: Secondary | ICD-10-CM

## 2020-05-15 DIAGNOSIS — K7460 Unspecified cirrhosis of liver: Secondary | ICD-10-CM

## 2020-05-15 DIAGNOSIS — Z720 Tobacco use: Secondary | ICD-10-CM

## 2020-05-15 MED ORDER — ONETOUCH VERIO VI STRP: 1.0000 | ORAL_STRIP | Freq: Four times a day (QID) | 3 refills | Status: AC

## 2020-05-15 NOTE — Chronic Care Management (AMB) (Signed)
Chronic Care Management   Pharmacy Note  05/15/2020 Name: Shannon [name] MRN: [id number] DOB: [birth year]  Subjective:  Shannon [name] is a 65 y.o. year old female who is a primary care patient of McLean-Scocuzza, Nino Glow, MD. The CCM team was consulted for assistance with chronic disease management and care coordination needs.    Engaged with patient by telephone for follow up visit in response to provider referral for pharmacy case management and/or care coordination services.   Consent to Services:  Patient was given information about Chronic Care Management services, agreed to services, and gave verbal consent prior to initiation of services on 01/02/20. Please see initial visit note for detailed documentation.   Objective:  Lab Results  Component Value Date   CREATININE 0.77 01/02/2020   CREATININE 0.80 01/23/2015   CREATININE 0.8 04/24/2014    Lab Results  Component Value Date   HGBA1C 6.5 (H) 04/28/2019       Component Value Date/Time   CHOL 193 04/28/2019 0815   TRIG 66 04/28/2019 0815   HDL 67 04/28/2019 0815   CHOLHDL 2.9 04/28/2019 0815   CHOLHDL 4.0 03/01/2018 1534   VLDL 25.2 01/23/2015 1317   LDLCALC 114 (H) 04/28/2019 0815   LDLCALC 151 (H) 03/01/2018 1534    Last vitamin D Lab Results  Component Value Date   VD25OH 36.3 04/28/2019     BP Readings from Last 3 Encounters:  04/11/20 128/81  01/02/20 118/78  09/27/19 110/78    Assessment/Interventions: Review of patient past medical history, allergies, medications, health status, including review of consultants reports, laboratory and other test data, was performed as part of comprehensive evaluation and provision of chronic care management services.   SDOH (Social Determinants of Health) assessments and interventions performed:  SDOH Interventions   Flowsheet Row Most Recent Value  SDOH Interventions   SDOH Interventions for the Following Domains Tobacco  Financial Strain Interventions  Intervention Not Indicated  Tobacco Interventions Cessation Materials Given and Reviewed       CCM Care Plan  Allergies  Allergen Reactions  . Hydrocodone Other (See Comments)    Sweating, lethargy, dizzy  . Lipitor [Atorvastatin Calcium]     Myalgias, joint pain    . Milk-Related Compounds     GI upset  . Oxycodone Other (See Comments)    Sweating, lethargy  . Adhesive [Tape] Rash    Band aids    Medications Reviewed Today    Reviewed by De Hollingshead, RPH-CPP (Pharmacist) on 05/15/20 at 1351  Med List Status: <None>  Medication Order Taking? Sig Documenting Provider Last Dose Status Informant  albuterol (PROAIR HFA) 108 (90 Base) MCG/ACT inhaler [id number] Yes Inhale 1-2 puffs into the lungs every 6 (six) hours as needed for wheezing or shortness of breath. McLean-Scocuzza, Nino Glow, MD Taking Active   azaTHIOprine (IMURAN) 50 MG tablet [id number] Yes Take 100 mg by mouth 2 (two) times daily.  [provider] Taking Active   blood glucose meter kit and supplies [id number] Yes Dispense based on patient and insurance preference. Use up to four times daily as directed. (FOR ICD-10 E10.9, E11.9). Marval Regal, NP Taking Active   blood glucose meter kit and supplies KIT [id number] Yes Dispense based on patient and insurance preference. Use up to four times daily as directed. (FOR ICD-9 250.00, 250.01). Marval Regal, NP Taking Active   Blood Glucose Monitoring Suppl (ONE TOUCH ULTRA SYSTEM KIT) W/DEVICE KIT [id number] Yes Test blood sugar as directed Dx E09.9 Deborra Medina,  Marciano Sequin, MD Taking Active   calcium-vitamin D (OSCAL WITH D) 500-200 MG-UNIT per tablet [id number] Yes Take 1 tablet by mouth. [provider] Taking Active   celecoxib (CELEBREX) 100 MG capsule [id number] No Take 100-200 mg by mouth.   Patient not taking: Reported on 05/15/2020   [provider] Not Taking Active            Med Note Darnelle Maffucci, Arville Lime   Tue May 15, 2020  1:44 PM) Taking  ibuprofen right now  Cholecalciferol (VITAMIN D3) 5000 units TABS [id number] Yes Take by mouth.  [provider] Taking Active   diclofenac sodium (VOLTAREN) 1 % GEL [id number] Yes Apply topically 4 (four) times daily. [provider] Taking Active   glucose blood (ONETOUCH VERIO) test strip [id number] Yes 1 each by Other route 4 (four) times daily. Use as instructed McLean-Scocuzza, Nino Glow, MD Taking Active   insulin degludec (TRESIBA FLEXTOUCH) 100 UNIT/ML FlexTouch Pen [id number] Yes Inject 10 Units into the skin daily. McLean-Scocuzza, Nino Glow, MD Taking Active   Insulin Pen Needle (PEN NEEDLES) 31G X 6 MM MISC [id number] Yes Use with Tyler Aas as instructed daily McLean-Scocuzza, Nino Glow, MD Taking Active   lactulose (CHRONULAC) 10 GM/15ML solution [id number] Yes TAKE 30 ML BY MOUTH 4 TIMES DAILY AS NEEDED  Patient taking differently: Take 30 g by mouth. 50-60 g 1x per day not 30 g qid   Lucille Passy, MD Taking Active   lidocaine (LIDODERM) 5 % [id number] Yes Place 1 patch onto the skin daily. Remove & Discard patch within 12 hours or as directed by MD Copland, Frederico Hamman, MD Taking Active   loratadine (CLARITIN) 10 MG tablet [id number] Yes Take 10 mg by mouth every other day as needed for allergies. [provider] Taking Active   Magnesium Oxide 500 MG TABS [id number] Yes Take by mouth. [provider] Taking Active   metFORMIN (GLUCOPHAGE-XR) 500 MG 24 hr tablet [id number] Yes Take 2 tablets (1,000 mg total) by mouth 2 (two) times daily with a meal. McLean-Scocuzza, Nino Glow, MD Taking Active   predniSONE (DELTASONE) 1 MG tablet [id number] Yes Take 4 mg by mouth daily with breakfast. [provider] Taking Active   pregabalin (LYRICA) 75 MG capsule [id number] Yes Take 75 mg by mouth 2 (two) times daily. [provider] Taking Active   spironolactone (ALDACTONE) 50 MG tablet [id number] No TAKE 1 TABLET BY MOUTH EVERY DAY  Patient not taking: Reported on  05/15/2020   Lucille Passy, MD Not Taking Active   traMADol (ULTRAM) 50 MG tablet [id number] Yes TAKE 1 TABLET BY MOUTH EVERY 8 HOURS AS NEEDED Lucille Passy, MD Taking Active   vitamin B-12 (CYANOCOBALAMIN) 100 MCG tablet [id number] Yes Take 100 mcg by mouth daily. [provider] Taking Active           Patient Active Problem List   Diagnosis Date Noted  . Leukopenia 04/11/2020  . Elevated liver enzymes 04/11/2020  . Adrenal insufficiency due to corticosteroid withdrawal ([place]) 01/02/2020  . Hirsutism 09/27/2019  . Osteoarthritis of both shoulders 09/27/2019  . Type 2 diabetes mellitus without complication, without long-term current use of insulin ([place]) 03/25/2019  . Gallstones 10/05/2018  . Mild intermittent asthma with exacerbation 04/07/2018  . History of colon polyps 03/03/2018  . Cirrhosis of liver ([place]) 02/26/2018  . Sacroiliac joint pain 02/26/2018  . Arthritis 02/26/2018  . Chronic pain 02/26/2018  . History of asthma 02/26/2018  . Bronchitis 08/23/2017  .  Portal hypertensive gastropathy ([place]) 01/23/2015  . Fatigue 01/23/2015  . HLD (hyperlipidemia) 11/23/2013  . Tobacco abuse 11/23/2013  . Fibromyalgia 08/16/2013  . Abdominal bloating 08/16/2013  . Lumbosacral radiculopathy at L5 03/26/2012  . OA (osteoarthritis) of knee 03/26/2012  . Candidiasis 10/08/2011  . Hyperpigmentation 11/04/2010  . Adjustment disorder 11/04/2010  . Steroid-induced diabetes ([place]) 05/31/2010  . Vitamin D deficiency 05/31/2010  . Autoimmune hepatitis ([place]) 05/31/2010  . Systemic lupus erythematosus ([place]) 05/31/2010    Conditions to be addressed/monitored: DM and hepatic disease  Patient Care Plan: Medication Management  Completed 05/15/2020  Problem Identified: Disease, Autoimmune Hepatitis, Chronic Pain Resolved 05/15/2020    Long-Range Goal: Disease Progression Prevention Completed 05/15/2020  This Visit's Progress: On track  Priority: High  Note:   Current Barriers:  . Unable  to achieve control of chronic pain  . Complex patient with multiple comorbidities (see problem list above)  Pharmacist Clinical Goal(s): chronic . Over the next 90 days, patient will achieve adherence to monitoring guidelines and medication adherence to achieve therapeutic efficacy through collaboration with PharmD and provider.  .   Interventions: . 1:1 collaboration with McLean-Scocuzza, Nino Glow, MD regarding development and update of comprehensive plan of care as evidenced by provider attestation and co-signature . Inter-disciplinary care team collaboration (see longitudinal plan of care) . Comprehensive medication review performed; medication list updated in electronic medical record  Diabetes: . Uncontrolled though improved per BGM readings;2 current treatment: metformin XR 1000 mg BID, Tresiba 10 units daily   . Current glucose readings: fasting glucose: 80-120s usually, one excursion to 140 but she ate a bigger supper the night before  . Denies hypoglycemic symptoms . Patient asked about seeing a capsule in her stool. Discussed concept of ghost capsules w/ some extended release formulations, provided reassurance that she was absorbing the medication.  . Recommend to continue current regimen with collaboration with endocrinology  Chronic Pain/? Autoimmune Disease, Autoimmune Hepatitis: . Uncontrolled; currently co-managed by rheumatology, hepatology, and establishing with pain clinic tomorrow; current regimen: pregabalin 75 mg QPM (has not escalated to BID yet); azathioprine 100 mg BID, prednisone 4 mg daily. Patient avoiding use of tramadol. Currently using lidocaine topical patches + bengay topical PRN, ibuprofen PRN for "swollen spot" on her lower R leg (s/p ED visit for work up on 12/27, she notes no worsening of the swelling since then).  Addison Bailey that hydroxychloroquine + Humira d/t by rheumatology d/t lack of benefit. She worries that her current increased leg pain may be related  to d/c of hydroxychloroquine. Not taking celecoxib right now due to concurrent ibuprofen . Describes episodes of "feeling like the car is moving when I am parked" since starting pregabalin. Plans to discuss w/ pain management tomorrow . Discussed that if pain management does not address leg pain to contact our office for PCP evaluation, as recommended in ED discharge instructions . Reviewed questions regarding hepatic metabolism of medications . Encouraged continued communication and collaboration with her specialist treatment teams.   Cirrhosis: . Symptoms currently well managed; reports that she is prescribed spironolactone, but notes that she has not been needing to take lately. Reports that after discussion with GI provider at last appointment, she realized she was taking too big a dose of lactulose. Now taking ~10 mL 2-3 times daily and reports improvement in symptoms without significant GI pain . Recommend to continue current regimen at this time with speciality collaboration.   Osteopenia in the setting of chronic prednisone use: Marland Kitchen Appropriately managed with  supplementation (calcium + vitamin D) . DEXA in 2021, t score -0.7. FRAX calculates to MOP 6%, hip fracture 0.6%.  Marland Kitchen Recommend to continue current regimen at this time  Tobacco Use: . Currently smoking ~ 1/2 ppd. Is not interested in working on reduction in tobacco at this point. Discussed that it helps her with anxiety . Discussed improved chances of cessation success with pharmacotherapy, particularly use of varenicline or dual nicotine replacement therapy. She will continue to contemplate and will discuss with her medical team if she decided she would like more support with cessation.   Patient Goals/Self-Care Activities . Over the next 90 days, patient will:  - take medications as prescribed check glucose periodically, document, and provide at future appointments  Follow Up Plan: Patient requests closure of CCM case at this  time d/t too many concurrent specialist appointments, lab work, and imaging. Will call moving forward if ever interested in re-enrolling.       Medication Assistance: None required. Patient affirms current coverage meets needs.   Plan: Closing CCM case as above. Patient has my contact information for future questions or concerns  Catie Darnelle Maffucci, PharmD, [place], [place] Clinical Pharmacist [employer] at [place]

## 2020-05-15 NOTE — Patient Instructions (Signed)
Visit Information  Patient Care Plan: Medication Management  Completed 05/15/2020  Problem Identified: Disease, Autoimmune Hepatitis, Chronic Pain Resolved 05/15/2020    Long-Range Goal: Disease Progression Prevention Completed 05/15/2020  This Visit's Progress: On track  Priority: High  Note:   Current Barriers:  . Unable to achieve control of chronic pain  . Complex patient with multiple comorbidities (see problem list above)  Pharmacist Clinical Goal(s): chronic . Over the next 90 days, patient will achieve adherence to monitoring guidelines and medication adherence to achieve therapeutic efficacy through collaboration with PharmD and provider.  .   Interventions: . 1:1 collaboration with McLean-Scocuzza, Nino Glow, MD regarding development and update of comprehensive plan of care as evidenced by provider attestation and co-signature . Inter-disciplinary care team collaboration (see longitudinal plan of care) . Comprehensive medication review performed; medication list updated in electronic medical record  Diabetes: . Uncontrolled though improved per BGM readings;2 current treatment: metformin XR 1000 mg BID, Tresiba 10 units daily   . Current glucose readings: fasting glucose: 80-120s usually, one excursion to 140 but she ate a bigger supper the night before  . Denies hypoglycemic symptoms . Patient asked about seeing a capsule in her stool. Discussed concept of ghost capsules w/ some extended release formulations, provided reassurance that she was absorbing the medication.  . Recommend to continue current regimen with collaboration with endocrinology  Chronic Pain/? Autoimmune Disease, Autoimmune Hepatitis: . Uncontrolled; currently co-managed by rheumatology, hepatology, and establishing with pain clinic tomorrow; current regimen: pregabalin 75 mg QPM (has not escalated to BID yet); azathioprine 100 mg BID, prednisone 4 mg daily. Patient avoiding use of tramadol. Currently using  lidocaine topical patches + bengay topical PRN, ibuprofen PRN for "swollen spot" on her lower R leg (s/p ED visit for work up on 12/27, she notes no worsening of the swelling since then).  Addison Bailey that hydroxychloroquine + Humira d/t by rheumatology d/t lack of benefit. She worries that her current increased leg pain may be related to d/c of hydroxychloroquine. Not taking celecoxib right now due to concurrent ibuprofen . Describes episodes of "feeling like the car is moving when I am parked" since starting pregabalin. Plans to discuss w/ pain management tomorrow . Discussed that if pain management does not address leg pain to contact our office for PCP evaluation, as recommended in ED discharge instructions . Reviewed questions regarding hepatic metabolism of medications . Encouraged continued communication and collaboration with her specialist treatment teams.   Cirrhosis: . Symptoms currently well managed; reports that she is prescribed spironolactone, but notes that she has not been needing to take lately. Reports that after discussion with GI provider at last appointment, she realized she was taking too big a dose of lactulose. Now taking ~10 mL 2-3 times daily and reports improvement in symptoms without significant GI pain . Recommend to continue current regimen at this time with speciality collaboration.   Osteopenia in the setting of chronic prednisone use: Marland Kitchen Appropriately managed with supplementation (calcium + vitamin D) . DEXA in 2021, t score -0.7. FRAX calculates to MOP 6%, hip fracture 0.6%.  Marland Kitchen Recommend to continue current regimen at this time  Tobacco Use: . Currently smoking ~ 1/2 ppd. Is not interested in working on reduction in tobacco at this point. Discussed that it helps her with anxiety . Discussed improved chances of cessation success with pharmacotherapy, particularly use of varenicline or dual nicotine replacement therapy. She will continue to contemplate and will  discuss with her medical team if  she decided she would like more support with cessation.   Patient Goals/Self-Care Activities . Over the next 90 days, patient will:  - take medications as prescribed check glucose periodically, document, and provide at future appointments  Follow Up Plan: Patient requests closure of CCM case at this time d/t too many concurrent specialist appointments, lab work, and imaging. Will call moving forward if ever interested in re-enrolling.       The patient verbalized understanding of instructions, educational materials, and care plan provided today and declined offer to receive copy of patient instructions, educational materials, and care plan.    Plan: Closing CCM case as above. Patient has my contact information for future questions or concerns  Catie Feliz Beam, PharmD, [place], CPP Clinical Pharmacist [employer] at [employer] [phone]

## 2020-05-16 DIAGNOSIS — M2550 Pain in unspecified joint: Secondary | ICD-10-CM | POA: Diagnosis not present

## 2020-05-16 DIAGNOSIS — G894 Chronic pain syndrome: Secondary | ICD-10-CM | POA: Diagnosis not present

## 2020-05-16 DIAGNOSIS — M461 Sacroiliitis, not elsewhere classified: Secondary | ICD-10-CM | POA: Diagnosis not present

## 2020-07-25 DIAGNOSIS — E099 Drug or chemical induced diabetes mellitus without complications: Secondary | ICD-10-CM | POA: Diagnosis not present

## 2020-07-25 DIAGNOSIS — T380X5D Adverse effect of glucocorticoids and synthetic analogues, subsequent encounter: Secondary | ICD-10-CM | POA: Diagnosis not present

## 2020-07-25 DIAGNOSIS — D72819 Decreased white blood cell count, unspecified: Secondary | ICD-10-CM | POA: Diagnosis not present

## 2020-07-25 DIAGNOSIS — R748 Abnormal levels of other serum enzymes: Secondary | ICD-10-CM | POA: Diagnosis not present

## 2020-07-25 DIAGNOSIS — K7460 Unspecified cirrhosis of liver: Secondary | ICD-10-CM | POA: Diagnosis not present

## 2020-07-26 LAB — COMPREHENSIVE METABOLIC PANEL
ALT: 35 IU/L — ABNORMAL HIGH (ref 0–32)
AST: 28 IU/L (ref 0–40)
Albumin/Globulin Ratio: 1.4 (ref 1.2–2.2)
Albumin: 4.1 g/dL (ref 3.8–4.8)
Alkaline Phosphatase: 69 IU/L (ref 44–121)
BUN/Creatinine Ratio: 17 (ref 12–28)
BUN: 13 mg/dL (ref 8–27)
Bilirubin Total: 0.6 mg/dL (ref 0.0–1.2)
CO2: 25 mmol/L (ref 20–29)
Calcium: 9.5 mg/dL (ref 8.7–10.3)
Chloride: 103 mmol/L (ref 96–106)
Creatinine, Ser: 0.75 mg/dL (ref 0.57–1.00)
Globulin, Total: 2.9 g/dL (ref 1.5–4.5)
Glucose: 96 mg/dL (ref 65–99)
Potassium: 4.4 mmol/L (ref 3.5–5.2)
Sodium: 143 mmol/L (ref 134–144)
Total Protein: 7 g/dL (ref 6.0–8.5)
eGFR: 90 mL/min/{1.73_m2} (ref >59)

## 2020-07-26 LAB — CBC WITH DIFFERENTIAL/PLATELET
Basophils Absolute: 0 10*3/uL (ref 0.0–0.2)
Basos: 0 %
EOS (ABSOLUTE): 0.1 10*3/uL (ref 0.0–0.4)
Eos: 4 %
Hematocrit: 37.3 % (ref 34.0–46.6)
Hemoglobin: 12.9 g/dL (ref 11.1–15.9)
Immature Grans (Abs): 0 10*3/uL (ref 0.0–0.1)
Immature Granulocytes: 0 %
Lymphocytes Absolute: 2.3 10*3/uL (ref 0.7–3.1)
Lymphs: 56 %
MCH: 37.2 pg — ABNORMAL HIGH (ref 26.6–33.0)
MCHC: 34.6 g/dL (ref 31.5–35.7)
MCV: 108 fL — ABNORMAL HIGH (ref 79–97)
Monocytes Absolute: 0.3 10*3/uL (ref 0.1–0.9)
Monocytes: 6 %
Neutrophils Absolute: 1.4 10*3/uL (ref 1.4–7.0)
Neutrophils: 34 %
Platelets: 212 10*3/uL (ref 150–450)
RBC: 3.47 x10E6/uL — ABNORMAL LOW (ref 3.77–5.28)
RDW: 14.3 % (ref 11.7–15.4)
WBC: 4.1 10*3/uL (ref 3.4–10.8)

## 2020-07-26 LAB — MICROSCOPIC EXAMINATION
Casts: NONE SEEN /lpf
WBC, UA: NONE SEEN /hpf (ref 0–5)

## 2020-07-26 LAB — URINALYSIS, ROUTINE W REFLEX MICROSCOPIC
Bilirubin, UA: NEGATIVE
Glucose, UA: NEGATIVE
Nitrite, UA: NEGATIVE
RBC, UA: NEGATIVE
Specific Gravity, UA: 1.026 (ref 1.005–1.030)
Urobilinogen, Ur: 1 mg/dL (ref 0.2–1.0)
pH, UA: 7 (ref 5.0–7.5)

## 2020-07-26 LAB — LIPID PANEL
Chol/HDL Ratio: 2.3 ratio (ref 0.0–4.4)
Cholesterol, Total: 131 mg/dL (ref 100–199)
HDL: 56 mg/dL (ref >39)
LDL Chol Calc (NIH): 65 mg/dL (ref 0–99)
Triglycerides: 39 mg/dL (ref 0–149)
VLDL Cholesterol Cal: 10 mg/dL (ref 5–40)

## 2020-07-26 LAB — HEMOGLOBIN A1C
Est. average glucose Bld gHb Est-mCnc: 128 mg/dL
Hgb A1c MFr Bld: 6.1 % — ABNORMAL HIGH (ref 4.8–5.6)

## 2020-07-27 LAB — PATHOLOGIST SMEAR REVIEW
Basophils Absolute: 0 10*3/uL (ref 0.0–0.2)
Basos: 1 %
EOS (ABSOLUTE): 0.2 10*3/uL (ref 0.0–0.4)
Eos: 4 %
Hematocrit: 37.5 % (ref 34.0–46.6)
Hemoglobin: 12.9 g/dL (ref 11.1–15.9)
Immature Grans (Abs): 0 10*3/uL (ref 0.0–0.1)
Immature Granulocytes: 0 %
Lymphocytes Absolute: 2.4 10*3/uL (ref 0.7–3.1)
Lymphs: 55 %
MCH: 36.9 pg — ABNORMAL HIGH (ref 26.6–33.0)
MCHC: 34.4 g/dL (ref 31.5–35.7)
MCV: 107 fL — ABNORMAL HIGH (ref 79–97)
Monocytes Absolute: 0.3 10*3/uL (ref 0.1–0.9)
Monocytes: 7 %
Neutrophils Absolute: 1.4 10*3/uL (ref 1.4–7.0)
Neutrophils: 33 %
Platelets: 210 10*3/uL (ref 150–450)
RBC: 3.5 x10E6/uL — ABNORMAL LOW (ref 3.77–5.28)
RDW: 14.2 % (ref 11.7–15.4)
WBC: 4.2 10*3/uL (ref 3.4–10.8)

## 2020-08-10 ENCOUNTER — Other Ambulatory Visit: Payer: Self-pay

## 2020-08-10 ENCOUNTER — Encounter: Payer: Self-pay | Admitting: Internal Medicine

## 2020-08-10 ENCOUNTER — Telehealth (INDEPENDENT_AMBULATORY_CARE_PROVIDER_SITE_OTHER): Payer: Medicare Other | Admitting: Internal Medicine

## 2020-08-10 VITALS — Ht 66.0 in | Wt 180.0 lb

## 2020-08-10 DIAGNOSIS — B370 Candidal stomatitis: Secondary | ICD-10-CM

## 2020-08-10 DIAGNOSIS — J40 Bronchitis, not specified as acute or chronic: Secondary | ICD-10-CM

## 2020-08-10 DIAGNOSIS — J4521 Mild intermittent asthma with (acute) exacerbation: Secondary | ICD-10-CM | POA: Diagnosis not present

## 2020-08-10 DIAGNOSIS — J4520 Mild intermittent asthma, uncomplicated: Secondary | ICD-10-CM | POA: Diagnosis not present

## 2020-08-10 MED ORDER — SPIRIVA RESPIMAT 1.25 MCG/ACT IN AERS: 2.0000 | INHALATION_SPRAY | Freq: Every day | RESPIRATORY_TRACT | 0 refills | Status: DC

## 2020-08-10 MED ORDER — AMOXICILLIN-POT CLAVULANATE 875-125 MG PO TABS: 1.0000 | ORAL_TABLET | Freq: Two times a day (BID) | ORAL | 0 refills | Status: DC

## 2020-08-10 MED ORDER — ALBUTEROL SULFATE HFA 108 (90 BASE) MCG/ACT IN AERS: 1.0000 | INHALATION_SPRAY | Freq: Four times a day (QID) | RESPIRATORY_TRACT | 3 refills | Status: DC | PRN

## 2020-08-10 MED ORDER — NYSTATIN 100000 UNIT/ML MT SUSP: 5.0000 mL | Freq: Four times a day (QID) | OROMUCOSAL | 0 refills | Status: DC

## 2020-08-10 MED ORDER — FLUCONAZOLE 150 MG PO TABS: 150.0000 mg | ORAL_TABLET | Freq: Once | ORAL | 0 refills | Status: AC

## 2020-08-10 NOTE — Progress Notes (Signed)
Onset of symptoms 8-9 days. Frequent cough, wheezing, slight temp elevation.  Tried using inhaler, allergy medication, cough syrup, and vitamin C

## 2020-08-10 NOTE — Progress Notes (Signed)
Virtual Visit via Video Note  I connected with Shannon [name]   on 08/10/20 at  9:50 AM EDT by a video enabled telemedicine application and verified that I am speaking with the correct person using two identifiers.  Location patient: home, [place] Location provider:work or home office Persons participating in the virtual visit: patient, provider  I discussed the limitations of evaluation and management by telemedicine and the availability of in person appointments. The patient expressed understanding and agreed to proceed.   HPI:  Acute telemedicine visit for :  respiratory symptoms  -Onset:  9 days  -Symptoms include: sob, cough, wheezing she has cut back on smoking and using nicotine patches, she has increased sputum scant on and off and clear to thicker than normal temp 99 F normally 97 F last episode of bronchitis was 2 years ago and 1x  She is wearing her mask She is on prednisone for AIH 4 mg qd x 2 months tapering dose  She has tried vitamin C as well but not helping   Checked for covid 08/08/20 and negative   -Denies: see above -Has tried: albuterol inhaler Q4-6 hrs nasal spray, allergy pill otc   -Pertinent past medical history:bronchitis   -Pertinent medication allergies: yes + allergies -COVID-19 vaccine status: moderna 1 declines further    ROS: See pertinent positives and negatives per HPI.  Past Medical History:  Diagnosis Date  . Asthma   . Autoimmune hepatitis ([place])   . Depression   . Diabetes mellitus without complication ([place])   . DUB (dysfunctional uterine bleeding)    2008 s/p hysterectomy  . Exposure to TB 2005   tx'ed x 6 months  . Gallstones   . Gastric ulcer   . GERD (gastroesophageal reflux disease)   . History of chicken pox   . Hx of migraines   . Liver disease    very low liver function, auto-immune hepatitis  . Motion sickness   . Osteoarthritis   . Systemic lupus erythematosus ([place]) 05/31/2010   Qualifier: Diagnosis of  By: Deborra Medina MD,  Tanja Port    . Vitamin D deficiency     Past Surgical History:  Procedure Laterality Date  . ABDOMINAL HYSTERECTOMY    . BACK SURGERY     L4/5 herniated disc repair 1992   . HALLUX VALGUS AUSTIN Left 02/09/2019   Procedure: HALLUX VALGUS AUSTIN ([place]);  Surgeon: [name], DPM;  Location: [facility];  Service: Podiatry;  Laterality: Left;  General with local  . HAMMER TOE SURGERY Left 02/09/2019   Procedure: HAMMER TOE CORRECTION;  Surgeon: [name], DPM;  Location: [facility];  Service: Podiatry;  Laterality: Left;  Diabetic - oral meds  . LAPAROSCOPIC HYSTERECTOMY    . TUBAL LIGATION       Current Outpatient Medications:  .  albuterol (PROAIR HFA) 108 (90 Base) MCG/ACT inhaler, Inhale 1-2 puffs into the lungs every 6 (six) hours as needed for wheezing or shortness of breath., Disp: 54 g, Rfl: 3 .  amoxicillin-clavulanate (AUGMENTIN) 875-125 MG tablet, Take 1 tablet by mouth 2 (two) times daily. With food, Disp: 14 tablet, Rfl: 0 .  azaTHIOprine (IMURAN) 50 MG tablet, Take 100 mg by mouth 2 (two) times daily. , Disp: , Rfl:  .  blood glucose meter kit and supplies KIT, Dispense based on patient and insurance preference. Use up to four times daily as directed. (FOR ICD-9 250.00, 250.01)., Disp: 1 each, Rfl: 0 .  blood glucose meter kit and supplies, Dispense  based on patient and insurance preference. Use up to four times daily as directed. (FOR ICD-10 E10.9, E11.9)., Disp: 1 each, Rfl: 0 .  Blood Glucose Monitoring Suppl (ONE TOUCH ULTRA SYSTEM KIT) W/DEVICE KIT, Test blood sugar as directed Dx E09.9, Disp: 1 each, Rfl: 0 .  calcium-vitamin D (OSCAL WITH D) 500-200 MG-UNIT per tablet, Take 1 tablet by mouth., Disp: , Rfl:  .  Cholecalciferol (VITAMIN D3) 5000 units TABS, Take by mouth. , Disp: , Rfl:  .  citalopram (CELEXA) 40 MG tablet, Take 40 mg by mouth daily., Disp: , Rfl:  .  diclofenac sodium (VOLTAREN) 1 % GEL, Apply topically 4 (four) times daily.,  Disp: , Rfl:  .  fluconazole (DIFLUCAN) 150 MG tablet, Take 1 tablet (150 mg total) by mouth once for 1 dose., Disp: 1 tablet, Rfl: 0 .  glucose blood (ONETOUCH VERIO) test strip, 1 each by Other route 4 (four) times daily. Use as instructed, Disp: 400 strip, Rfl: 3 .  insulin degludec (TRESIBA FLEXTOUCH) 100 UNIT/ML FlexTouch Pen, Inject 10 Units into the skin daily., Disp: 15 mL, Rfl: 3 .  Insulin Pen Needle (PEN NEEDLES) 31G X 6 MM MISC, Use with [place] as instructed daily, Disp: 100 each, Rfl: 3 .  lactulose (CHRONULAC) 10 GM/15ML solution, TAKE 30 ML BY MOUTH 4 TIMES DAILY AS NEEDED (Patient taking differently: Take 30 g by mouth. 50-60 g 1x per day not 30 g qid), Disp: 10800 mL, Rfl: 3 .  loratadine (CLARITIN) 10 MG tablet, Take 10 mg by mouth every other day as needed for allergies., Disp: , Rfl:  .  Magnesium Oxide 500 MG TABS, Take by mouth., Disp: , Rfl:  .  metFORMIN (GLUCOPHAGE-XR) 500 MG 24 hr tablet, Take 2 tablets (1,000 mg total) by mouth 2 (two) times daily with a meal., Disp: 360 tablet, Rfl: 3 .  nystatin (MYCOSTATIN) 100000 UNIT/ML suspension, Take 5 mLs (500,000 Units total) by mouth 4 (four) times daily. Hold and spit, Disp: 473 mL, Rfl: 0 .  predniSONE (DELTASONE) 1 MG tablet, Take 4 mg by mouth daily with breakfast., Disp: , Rfl:  .  pregabalin (LYRICA) 75 MG capsule, Take 75 mg by mouth 2 (two) times daily., Disp: , Rfl:  .  spironolactone (ALDACTONE) 50 MG tablet, TAKE 1 TABLET BY MOUTH EVERY DAY, Disp: 90 tablet, Rfl: 1 .  Tiotropium Bromide Monohydrate (SPIRIVA RESPIMAT) 1.25 MCG/ACT AERS, Inhale 2 puffs into the lungs daily., Disp: 4 g, Rfl: 0 .  vitamin B-12 (CYANOCOBALAMIN) 100 MCG tablet, Take 100 mcg by mouth daily., Disp: , Rfl:  .  lidocaine (LIDODERM) 5 %, Place 1 patch onto the skin daily. Remove & Discard patch within 12 hours or as directed by MD (Patient not taking: Reported on 08/10/2020), Disp: 30 patch, Rfl: 5 .  traMADol (ULTRAM) 50 MG tablet, TAKE 1 TABLET  BY MOUTH EVERY 8 HOURS AS NEEDED (Patient not taking: Reported on 08/10/2020), Disp: 60 tablet, Rfl: 0  EXAM:  VITALS per patient if applicable:  GENERAL: alert, oriented, appears well and in no acute distress  HEENT: atraumatic, conjunttiva clear, no obvious abnormalities on inspection of external nose and ears  NECK: normal movements of the head and neck  LUNGS: on inspection no signs of respiratory distress, breathing rate appears normal, no obvious gross SOB, gasping or wheezing  CV: no obvious cyanosis  MS: moves all visible extremities without noticeable abnormality  PSYCH/NEURO: pleasant and cooperative, no obvious depression or anxiety, speech and thought processing  grossly intact  ASSESSMENT AND PLAN:  Discussed the following assessment and plan:  Mild intermittent asthma with acute exacerbation with h/o bronchitis - Plan: amoxicillin-clavulanate (AUGMENTIN) 875-125 MG tablet, Tiotropium Bromide Monohydrate (SPIRIVA RESPIMAT) 1.25 MCG/ACT AERS congrats on cutting back cigs rec cessation  lbuterol inhaler Q4-6 hrs nasal spray, allergy pill otc  Declines oral steroids due to DM and symbicort due to h/o thrush for now  Thrush - Plan: nystatin (MYCOSTATIN) 100000 UNIT/ML suspension, fluconazole (DIFLUCAN) 150 MG tablet  HM Flu shotwill consider late 2021/early 2022 has not had yet as of 04/11/20 Consider shingrix in future  prevnar had 04/24/14 consider pna 23 in future  Tdap had 11/04/10 Hep A/B immune 1/2 moderna refuses 2nd dose  rech healthy diet and smoking cessation  Pap had 03/31/07 cant exclude HSIL s/p hysterectomy for DUB in 2008 given history rec pt to think about it -pap11/13/2020 negative  Mammogram pt declined on multiple occasionsdeclined today 03/24/2019 and declined5/18/21, 04/11/20   DEXA 10/05/15 normal, 10/06/17 lumbar and femur neck osteopenia Vitamin D 22.8 07/13/17-rec D3 5000 IU qd  Colonoscopy 07/08/16 diverticulosis, polpys x 3  hyperplastic, IH [facility] q 10 years  EGD 10/30/17 mild portal hypertensive gastropathy in gastric body  HCV neg 03/19/10, hep B sAg neg 03/19/10 declinesHIV  Smoking x 40 years 1 pk lasts 2 days max 1 ppd FH mom lung was smoker -rec cessation  [facility] saw 06/22/19 no toxic maculopathy, ocp normal f/u hydroxychloroquine yearly Dr. Edison Pace rec healthy diet and exercise  -we discussed possible serious and likely etiologies, options for evaluation and workup, limitations of telemedicine visit vs in person visit, treatment, treatment risks and precautions.   I discussed the assessment and treatment plan with the patient. The patient was provided an opportunity to ask questions and all were answered. The patient agreed with the plan and demonstrated an understanding of the instructions.    Time 20 min Delorise Jackson, MD

## 2020-09-03 ENCOUNTER — Ambulatory Visit (INDEPENDENT_AMBULATORY_CARE_PROVIDER_SITE_OTHER): Payer: Medicare Other

## 2020-09-03 VITALS — Ht 66.0 in | Wt 180.0 lb

## 2020-09-03 DIAGNOSIS — Z0000 Encounter for general adult medical examination without abnormal findings: Secondary | ICD-10-CM | POA: Diagnosis not present

## 2020-09-03 NOTE — Progress Notes (Addendum)
Subjective:   Shannon [name] is a 65 y.o. female who presents for Medicare Annual (Subsequent) preventive examination.  Review of Systems    No ROS.  Medicare Wellness Virtual Visit.  Cardiac Risk Factors include: advanced age (>60mn, >>50women);hypertension;diabetes mellitus    Objective:    Today's Vitals   09/03/20 1000  Weight: 180 lb (81.6 kg)  Height: _0  (1.676 m)   Body mass index is 29.05 kg/m.  Advanced Directives 09/03/2020 09/01/2019 05/23/2019 02/09/2019 08/31/2018 07/18/2017  Does Patient Have a Medical Advance Directive? Yes _1   Type of Advance Directive [place] Does patient want to make changes to medical advance directive? No - Patient declined - - - - -  Copy of [place] Chart? No - copy requested - - - - -  Would patient like information on creating a medical advance directive? - No - Patient declined No - Patient declined No - Patient declined No - Patient declined -    Current Medications (verified) Outpatient Encounter Medications as of 09/03/2020  Medication Sig  . albuterol (PROAIR HFA) 108 (90 Base) MCG/ACT inhaler Inhale 1-2 puffs into the lungs every 6 (six) hours as needed for wheezing or shortness of breath.  . azaTHIOprine (IMURAN) 50 MG tablet Take 100 mg by mouth 2 (two) times daily.   . blood glucose meter kit and supplies KIT Dispense based on patient and insurance preference. Use up to four times daily as directed. (FOR ICD-9 250.00, 250.01).  . blood glucose meter kit and supplies Dispense based on patient and insurance preference. Use up to four times daily as directed. (FOR ICD-10 E10.9, E11.9).  .Marland KitchenBlood Glucose Monitoring Suppl (ONE TOUCH ULTRA SYSTEM KIT) W/DEVICE KIT Test blood sugar as directed Dx E09.9  . calcium-vitamin D (OSCAL WITH D) 500-200 MG-UNIT per tablet Take 1 tablet by mouth.  . Cholecalciferol (VITAMIN D3) 5000 units TABS Take by mouth.   . citalopram  (CELEXA) 40 MG tablet Take 40 mg by mouth daily.  . diclofenac sodium (VOLTAREN) 1 % GEL Apply topically 4 (four) times daily.  .Marland Kitchenglucose blood (ONETOUCH VERIO) test strip 1 each by Other route 4 (four) times daily. Use as instructed  . insulin degludec (TRESIBA FLEXTOUCH) 100 UNIT/ML FlexTouch Pen Inject 10 Units into the skin daily.  . Insulin Pen Needle (PEN NEEDLES) 31G X 6 MM MISC Use with TTyler Aasas instructed daily  . lactulose (CHRONULAC) 10 GM/15ML solution TAKE 30 ML BY MOUTH 4 TIMES DAILY AS NEEDED (Patient taking differently: Take 30 g by mouth. 50-60 g 1x per day not 30 g qid)  . lidocaine (LIDODERM) 5 % Place 1 patch onto the skin daily. Remove & Discard patch within 12 hours or as directed by MD (Patient not taking: Reported on 08/10/2020)  . loratadine (CLARITIN) 10 MG tablet Take 10 mg by mouth every other day as needed for allergies.  . Magnesium Oxide 500 MG TABS Take by mouth.  . metFORMIN (GLUCOPHAGE-XR) 500 MG 24 hr tablet Take 2 tablets (1,000 mg total) by mouth 2 (two) times daily with a meal.  . nystatin (MYCOSTATIN) 100000 UNIT/ML suspension Take 5 mLs (500,000 Units total) by mouth 4 (four) times daily. Hold and spit  . predniSONE (DELTASONE) 1 MG tablet Take 4 mg by mouth daily with breakfast.  . pregabalin (LYRICA) 75 MG capsule Take 75 mg by mouth 2 (two) times daily.  .Marland Kitchen  spironolactone (ALDACTONE) 50 MG tablet TAKE 1 TABLET BY MOUTH EVERY DAY  . Tiotropium Bromide Monohydrate (SPIRIVA RESPIMAT) 1.25 MCG/ACT AERS Inhale 2 puffs into the lungs daily.  . traMADol (ULTRAM) 50 MG tablet TAKE 1 TABLET BY MOUTH EVERY 8 HOURS AS NEEDED (Patient not taking: Reported on 08/10/2020)  . vitamin B-12 (CYANOCOBALAMIN) 100 MCG tablet Take 100 mcg by mouth daily.  . [DISCONTINUED] amoxicillin-clavulanate (AUGMENTIN) 875-125 MG tablet Take 1 tablet by mouth 2 (two) times daily. With food   No facility-administered encounter medications on file as of 09/03/2020.    Allergies  (verified) Hydrocodone, Lipitor [atorvastatin calcium], Milk-related compounds, Oxycodone, and Adhesive [tape]   History: Past Medical History:  Diagnosis Date  . Asthma   . Autoimmune hepatitis ([place])   . Depression   . Diabetes mellitus without complication ([place])   . DUB (dysfunctional uterine bleeding)    2008 s/p hysterectomy  . Exposure to TB 2005   tx'ed x 6 months  . Gallstones   . Gastric ulcer   . GERD (gastroesophageal reflux disease)   . History of chicken pox   . Hx of migraines   . Liver disease    very low liver function, auto-immune hepatitis  . Motion sickness   . Osteoarthritis   . Systemic lupus erythematosus ([place]) 05/31/2010   Qualifier: Diagnosis of  By: Deborra Medina MD, Tanja Port    . Vitamin D deficiency    Past Surgical History:  Procedure Laterality Date  . ABDOMINAL HYSTERECTOMY    . BACK SURGERY     L4/5 herniated disc repair 1992   . HALLUX VALGUS AUSTIN Left 02/09/2019   Procedure: HALLUX VALGUS AUSTIN ([place]);  Surgeon: [name], DPM;  Location: [facility];  Service: Podiatry;  Laterality: Left;  General with local  . HAMMER TOE SURGERY Left 02/09/2019   Procedure: HAMMER TOE CORRECTION;  Surgeon: [name], DPM;  Location: [facility];  Service: Podiatry;  Laterality: Left;  Diabetic - oral meds  . LAPAROSCOPIC HYSTERECTOMY    . TUBAL LIGATION     Family History  Problem Relation Age of Onset  . Cancer Mother        lung cancer smoker  . Heart disease Father   . Hypertension Father   . Hypertension Sister   . Sarcoidosis Sister        lung transplant failure  . Heart disease Brother   . Alcohol abuse Brother   . Diabetes Sister   . Diabetes Sister   . Kidney disease Sister   . Hypertension Sister   . Rheum arthritis Sister   . Diabetes Son   . Hypertension Son    Social History   Socioeconomic History  . Marital status: Legally Separated    Spouse name: Not on file  . Number of children: Not on file  . Years  of education: Not on file  . Highest education level: Not on file  Occupational History  . Not on file  Tobacco Use  . Smoking status: Current Every Day Smoker    Packs/day: 0.50    Years: 30.00    Pack years: 15.00    Types: Cigarettes  . Smokeless tobacco: Never Used  Vaping Use  . Vaping Use: Never used  Substance and Sexual Activity  . Alcohol use: No  . Drug use: No  . Sexual activity: Not Currently  Other Topics Concern  . Not on file  Social History Narrative   4 pregnancies, 3 kids  Smoker    Separated    College ed former LPN   Social Determinants of Health   Financial Resource Strain: Low Risk   . Difficulty of Paying Living Expenses: Not hard at all  Food Insecurity: No Food Insecurity  . Worried About Charity fundraiser in the Last Year: Never true  . Ran Out of Food in the Last Year: Never true  Transportation Needs: No Transportation Needs  . Lack of Transportation (Medical): No  . Lack of Transportation (Non-Medical): No  Physical Activity: Not on file  Stress: No Stress Concern Present  . Feeling of Stress : Not at all  Social Connections: Unknown  . Frequency of Communication with Friends and Family: More than three times a week  . Frequency of Social Gatherings with Friends and Family: More than three times a week  . Attends Religious Services: Not on file  . Active Member of Clubs or Organizations: Not on file  . Attends Archivist Meetings: Not on file  . Marital Status: Not on file    Tobacco Counseling Ready to quit: Not Answered Counseling given: Not Answered   Clinical Intake:  Pre-visit preparation completed: Yes        Diabetes: Yes (Followed by pcp)  How often do you need to have someone help you when you read instructions, pamphlets, or other written materials from your doctor or pharmacy?: 1 - Never  Diabetes Management: Is the patient seen by Chronic Care Management for management of their diabetes?  Yes    Interpreter Needed?: No      Activities of Daily Living In your present state of health, do you have any difficulty performing the following activities: 09/03/2020  Hearing? N  Vision? N  Difficulty concentrating or making decisions? N  Walking or climbing stairs? N  Dressing or bathing? N  Doing errands, shopping? N  Preparing Food and eating ? N  Using the Toilet? N  In the past six months, have you accidently leaked urine? N  Do you have problems with loss of bowel control? N  Managing your Medications? N  Managing your Finances? N  Housekeeping or managing your Housekeeping? N  Some recent data might be hidden    Patient Care Team: McLean-Scocuzza, Nino Glow, MD as PCP - General (Internal Medicine)  Indicate any recent Medical Services you may have received from other than [facility] providers in the past year (date may be approximate).     Assessment:   This is a routine wellness examination for [place].  I connected with Viann today by telephone and verified that I am speaking with the correct person using two identifiers. Location patient: home Location provider: work Persons participating in the virtual visit: patient, Marine scientist.    I discussed the limitations, risks, security and privacy concerns of performing an evaluation and management service by telephone and the availability of in person appointments. The patient expressed understanding and verbally consented to this telephonic visit.    Interactive audio and video telecommunications were attempted between this provider and patient, however failed, due to patient having technical difficulties OR patient did not have access to video capability.  We continued and completed visit with audio only.  Some vital signs may be absent or patient reported.   Hearing/Vision screen  Hearing Screening   _0  _1  _2  _3  _4  _5  _6  _7  _8   Right ear:           Left ear:  Comments: Patient is  able to hear conversational tones without difficulty.  No issues reported.    Vision Screening Comments: [facility]  Wears corrective lenses  Visual acuity not assessed due to virtual visit. She has seen her ophthalmologist in the last 12 months.  Dietary issues and exercise activities discussed: Intensity: Mild  Low carb diet Good water intake  Goals      Patient Stated   .  Client plans to start chantix and stop smoking (pt-stated)      Patient states she smokes less      Depression Screen PHQ 2/9 Scores 09/03/2020 09/01/2019 03/25/2019 10/05/2018 08/31/2018 04/07/2018 07/31/2017  PHQ - 2 Score 0 0 0 0 0 0 4  PHQ- 9 Score - - - - - - 16    Fall Risk Fall Risk  09/03/2020 08/10/2020 04/11/2020 09/27/2019 09/01/2019  Falls in the past year? 0 0 0 0 0  Number falls in past yr: - 0 0 0 -  Injury with Fall? - 0 0 0 -  Risk for fall due to : - - - - -  Follow up _0    FALL RISK PREVENTION PERTAINING TO THE HOME: Handrails in use when climbing stairs? Yes Home free of loose throw rugs in walkways, pet beds, electrical cords, etc? Yes  Adequate lighting in your home to reduce risk of falls? Yes   ASSISTIVE DEVICES UTILIZED TO PREVENT FALLS: Life alert? No  Use of a cane, walker or w/c? Yes , as needed Grab bars in the bathroom? Yes   Shower chair or bench in shower? No  Elevated toilet seat or a handicapped toilet? No   TIMED UP AND GO:  Was the test performed? No . Virtual visit.   Cognitive Function:  Patient is alert and oriented x3.  Denies difficulty focusing, making decisions, memory loss.  Enjoys puzzles for brain health.  MMSE/6CIT deferred. Normal by direct communication/observation.    6CIT Screen 09/01/2019 08/31/2018  What Year? 0 points 0 points  What month? 0 points 0 points  What time? 0 points 0 points  Count  back from 20 - 0 points  Months in reverse 0 points 0 points  Repeat phrase 0 points 0 points  Total Score - 0   Immunizations Immunization History  Administered Date(s) Administered  . Influenza Split 03/11/2011, 03/26/2012  . Influenza, High Dose Seasonal PF 02/26/2018, 02/26/2018  . Influenza,inj,Quad PF,6+ Mos 02/14/2013, 03/11/2017, 05/16/2019  . Influenza-Unspecified 02/09/2013  . Moderna Sars-Covid-2 Vaccination 07/08/2019  . PPD Test 06/18/2009  . Pneumococcal Conjugate-13 04/24/2014  . Td 05/16/2003  . Tdap 11/04/2010    Health Maintenance Health Maintenance  Topic Date Due  . URINE MICROALBUMIN  04/27/2020  . FOOT EXAM  05/01/2020  . PNEUMOCOCCAL POLYSACCHARIDE VACCINE AGE 19-64 HIGH RISK  09/13/2020 (Originally 04/14/1958)  . HIV Screening  09/26/2020 (Originally 04/14/1971)  . COVID-19 Vaccine (2 - Moderna 3-dose series) 04/21/2021 (Originally 08/05/2019)  . OPHTHALMOLOGY EXAM  10/24/2020  . TETANUS/TDAP  11/03/2020  . INFLUENZA VACCINE  12/10/2020  . HEMOGLOBIN A1C  01/25/2021  . PAP SMEAR-Modifier  03/24/2022  . COLONOSCOPY (Pts 45-19yr Insurance coverage will need to be confirmed)  07/08/2026  . Hepatitis C Screening  Completed  . HPV VACCINES  Aged Out  . MAMMOGRAM  Discontinued   Colorectal cancer screening: Type of screening: Colonoscopy. Completed 06/2016. Repeat every 10 years   Mammogram  status: No longer required due to patient preference.  Vision Screening: Recommended annual ophthalmology exams for early detection of glaucoma and other disorders of the eye. Is the patient up to date with their annual eye exam?  Yes   Dental Screening: Recommended annual dental exams for proper oral hygiene.  Community Resource Referral / Chronic Care Management: CRR required this visit?  No   CCM required this visit?  No      Plan:   Keep all routine maintenance appointments.   Follow up 09/13/20 @ 1:30; VV per request  I have personally reviewed and noted  the following in the patient's chart:   . Medical and social history . Use of alcohol, tobacco or illicit drugs  . Current medications and supplements . Functional ability and status . Nutritional status . Physical activity . Advanced directives . List of other physicians . Hospitalizations, surgeries, and ER visits in previous 12 months . Vitals . Screenings to include cognitive, depression, and falls . Referrals and appointments  In addition, I have reviewed and discussed with patient certain preventive protocols, quality metrics, and best practice recommendations. A written personalized care plan for preventive services as well as general preventive health recommendations were provided to patient via mychart.    Agree with plan. Mable Paris, NP  Varney Biles, LPN   [id number]

## 2020-09-03 NOTE — Patient Instructions (Addendum)
Shannon [name] , Thank you for taking time to come for your Medicare Wellness Visit. I appreciate your ongoing commitment to your health goals. Please review the following plan we discussed and let me know if I can assist you in the future.   These are the goals we discussed: Goals      Patient Stated   .  Client plans to start chantix and stop smoking (pt-stated)      Patient states she smokes less       This is a list of the screening recommended for you and due dates:  Health Maintenance  Topic Date Due  . Urine Protein Check  04/27/2020  . Complete foot exam   05/01/2020  . Pneumococcal vaccine  09/13/2020*  . HIV Screening  09/26/2020*  . COVID-19 Vaccine (2 - Moderna 3-dose series) 04/21/2021*  . Eye exam for diabetics  10/24/2020  . Tetanus Vaccine  11/03/2020  . Flu Shot  12/10/2020  . Hemoglobin A1C  01/25/2021  . Pap Smear  03/24/2022  . Colon Cancer Screening  07/08/2026  .  Hepatitis C: One time screening is recommended by Center for Disease Control  (CDC) for  adults born from [birth year] through 1963.   Completed  . HPV Vaccine  Aged Out  . Mammogram  Discontinued  *Topic was postponed. The date shown is not the original due date.   Keep all routine maintenance appointments.   Follow up 09/13/20 @ 1:30  Advanced directives: End of life planning; Advance aging; Advanced directives discussed.  Copy of current HCPOA/Living Will requested.    Conditions/risks identified: none new.   Follow up in one year for your annual wellness visit.  Preventive Care 40-64 Years, Female Preventive care refers to lifestyle choices and visits with your health care provider that can promote health and wellness. What does preventive care include?  A yearly physical exam. This is also called an annual well check.  Dental exams once or twice a year.  Routine eye exams. Ask your health care provider how often you should have your eyes checked.  Personal lifestyle choices,  including:  Daily care of your teeth and gums.  Regular physical activity.  Eating a healthy diet.  Avoiding tobacco and drug use.  Limiting alcohol use.  Practicing safe sex.  Taking low-dose aspirin daily starting at age 36.  Taking vitamin and mineral supplements as recommended by your health care provider. What happens during an annual well check? The services and screenings done by your health care provider during your annual well check will depend on your age, overall health, lifestyle risk factors, and family history of disease. Counseling  Your health care provider may ask you questions about your:  Alcohol use.  Tobacco use.  Drug use.  Emotional well-being.  Home and relationship well-being.  Sexual activity.  Eating habits.  Work and work Statistician.  Method of birth control.  Menstrual cycle.  Pregnancy history. Screening  You may have the following tests or measurements:  Height, weight, and BMI.  Blood pressure.  Lipid and cholesterol levels. These may be checked every 5 years, or more frequently if you are over 26 years old.  Skin check.  Lung cancer screening. You may have this screening every year starting at age 19 if you have a 30-pack-year history of smoking and currently smoke or have quit within the past 15 years.  Fecal occult blood test (FOBT) of the stool. You may have this test every year starting at age  50.  Flexible sigmoidoscopy or colonoscopy. You may have a sigmoidoscopy every 5 years or a colonoscopy every 10 years starting at age 50.  Hepatitis C blood test.  Hepatitis B blood test.  Sexually transmitted disease (STD) testing.  Diabetes screening. This is done by checking your blood sugar (glucose) after you have not eaten for a while (fasting). You may have this done every 1-3 years.  Mammogram. This may be done every 1-2 years. Talk to your health care provider about when you should start having regular  mammograms. This may depend on whether you have a family history of breast cancer.  BRCA-related cancer screening. This may be done if you have a family history of breast, ovarian, tubal, or peritoneal cancers.  Pelvic exam and Pap test. This may be done every 3 years starting at age 56. Starting at age 83, this may be done every 5 years if you have a Pap test in combination with an HPV test.  Bone density scan. This is done to screen for osteoporosis. You may have this scan if you are at high risk for osteoporosis. Discuss your test results, treatment options, and if necessary, the need for more tests with your health care provider. Vaccines  Your health care provider may recommend certain vaccines, such as:  Influenza vaccine. This is recommended every year.  Tetanus, diphtheria, and acellular pertussis (Tdap, Td) vaccine. You may need a Td booster every 10 years.  Zoster vaccine. You may need this after age 49.  Pneumococcal 13-valent conjugate (PCV13) vaccine. You may need this if you have certain conditions and were not previously vaccinated.  Pneumococcal polysaccharide (PPSV23) vaccine. You may need one or two doses if you smoke cigarettes or if you have certain conditions. Talk to your health care provider about which screenings and vaccines you need and how often you need them. This information is not intended to replace advice given to you by your health care provider. Make sure you discuss any questions you have with your health care provider. Document Released: 05/25/2015 Document Revised: 01/16/2016 Document Reviewed: 02/27/2015 Elsevier Interactive Patient Education  2017 [place] Prevention in the Home Falls can cause injuries. They can happen to people of all ages. There are many things you can do to make your home safe and to help prevent falls. What can I do on the outside of my home?  Regularly fix the edges of walkways and driveways and fix any  cracks.  Remove anything that might make you trip as you walk through a door, such as a raised step or threshold.  Trim any bushes or trees on the path to your home.  Use bright outdoor lighting.  Clear any walking paths of anything that might make someone trip, such as rocks or tools.  Regularly check to see if handrails are loose or broken. Make sure that both sides of any steps have handrails.  Any raised decks and porches should have guardrails on the edges.  Have any leaves, snow, or ice cleared regularly.  Use sand or salt on walking paths during winter.  Clean up any spills in your garage right away. This includes oil or grease spills. What can I do in the bathroom?  Use night lights.  Install grab bars by the toilet and in the tub and shower. Do not use towel bars as grab bars.  Use non-skid mats or decals in the tub or shower.  If you need to sit down in  the shower, use a plastic, non-slip stool.  Keep the floor dry. Clean up any water that spills on the floor as soon as it happens.  Remove soap buildup in the tub or shower regularly.  Attach bath mats securely with double-sided non-slip rug tape.  Do not have throw rugs and other things on the floor that can make you trip. What can I do in the bedroom?  Use night lights.  Make sure that you have a light by your bed that is easy to reach.  Do not use any sheets or blankets that are too big for your bed. They should not hang down onto the floor.  Have a firm chair that has side arms. You can use this for support while you get dressed.  Do not have throw rugs and other things on the floor that can make you trip. What can I do in the kitchen?  Clean up any spills right away.  Avoid walking on wet floors.  Keep items that you use a lot in easy-to-reach places.  If you need to reach something above you, use a strong step stool that has a grab bar.  Keep electrical cords out of the way.  Do not use floor  polish or wax that makes floors slippery. If you must use wax, use non-skid floor wax.  Do not have throw rugs and other things on the floor that can make you trip. What can I do with my stairs?  Do not leave any items on the stairs.  Make sure that there are handrails on both sides of the stairs and use them. Fix handrails that are broken or loose. Make sure that handrails are as long as the stairways.  Check any carpeting to make sure that it is firmly attached to the stairs. Fix any carpet that is loose or worn.  Avoid having throw rugs at the top or bottom of the stairs. If you do have throw rugs, attach them to the floor with carpet tape.  Make sure that you have a light switch at the top of the stairs and the bottom of the stairs. If you do not have them, ask someone to add them for you. What else can I do to help prevent falls?  Wear shoes that:  Do not have high heels.  Have rubber bottoms.  Are comfortable and fit you well.  Are closed at the toe. Do not wear sandals.  If you use a stepladder:  Make sure that it is fully opened. Do not climb a closed stepladder.  Make sure that both sides of the stepladder are locked into place.  Ask someone to hold it for you, if possible.  Clearly mark and make sure that you can see:  Any grab bars or handrails.  First and last steps.  Where the edge of each step is.  Use tools that help you move around (mobility aids) if they are needed. These include:  Canes.  Walkers.  Scooters.  Crutches.  Turn on the lights when you go into a dark area. Replace any light bulbs as soon as they burn out.  Set up your furniture so you have a clear path. Avoid moving your furniture around.  If any of your floors are uneven, fix them.  If there are any pets around you, be aware of where they are.  Review your medicines with your doctor. Some medicines can make you feel dizzy. This can increase your chance of falling. Ask your  doctor what other things that you can do to help prevent falls. This information is not intended to replace advice given to you by your health care provider. Make sure you discuss any questions you have with your health care provider. Document Released: 02/22/2009 Document Revised: 10/04/2015 Document Reviewed: 06/02/2014 Elsevier Interactive Patient Education  2017 [employer].

## 2020-09-13 ENCOUNTER — Other Ambulatory Visit: Payer: Self-pay

## 2020-09-13 ENCOUNTER — Ambulatory Visit (INDEPENDENT_AMBULATORY_CARE_PROVIDER_SITE_OTHER): Payer: Medicare Other | Admitting: Internal Medicine

## 2020-09-13 ENCOUNTER — Encounter: Payer: Self-pay | Admitting: Internal Medicine

## 2020-09-13 VITALS — Ht 66.0 in | Wt 180.0 lb

## 2020-09-13 DIAGNOSIS — E119 Type 2 diabetes mellitus without complications: Secondary | ICD-10-CM

## 2020-09-13 DIAGNOSIS — Z720 Tobacco use: Secondary | ICD-10-CM

## 2020-09-13 DIAGNOSIS — R748 Abnormal levels of other serum enzymes: Secondary | ICD-10-CM | POA: Diagnosis not present

## 2020-09-13 DIAGNOSIS — Z0000 Encounter for general adult medical examination without abnormal findings: Secondary | ICD-10-CM

## 2020-09-13 DIAGNOSIS — Z23 Encounter for immunization: Secondary | ICD-10-CM

## 2020-09-13 MED ORDER — PNEUMOCOCCAL VAC POLYVALENT 25 MCG/0.5ML IJ INJ: 0.5000 mL | INJECTION | Freq: Once | INTRAMUSCULAR | 0 refills | Status: AC

## 2020-09-13 MED ORDER — TETANUS-DIPHTH-ACELL PERTUSSIS 5-2.5-18.5 LF-MCG/0.5 IM SUSP: 0.5000 mL | Freq: Once | INTRAMUSCULAR | 0 refills | Status: AC

## 2020-09-13 MED ORDER — TRESIBA FLEXTOUCH 100 UNIT/ML ~~LOC~~ SOPN: 10.0000 [IU] | PEN_INJECTOR | Freq: Every day | SUBCUTANEOUS | 11 refills | Status: DC

## 2020-09-13 MED ORDER — PEN NEEDLES 31G X 6 MM MISC
11 refills | Status: AC
Start: 2020-09-13 — End: ?

## 2020-09-13 NOTE — Progress Notes (Addendum)
Virtual Visit via Video Note  I connected with Shannon [name]   on 09/13/20 at  1:45 PM EDT by a video enabled telemedicine application and verified that I am speaking with the correct person using two identifiers.  Location patient: home, [place] Location provider:work or home office Persons participating in the virtual visit: patient, provider  I discussed the limitations of evaluation and management by telemedicine and the availability of in person appointments. The patient expressed understanding and agreed to proceed.   HPI: Follow up  DM 2 A1C at goal 6.1 07/25/20 on tresiba 10 units and metformin xr 1000 mg bid   Stress with 9 siblings and her brother calls her for issues a lot which increasing anxiety and causes her to smoke cigarettes though she is trying to cut back and try nicotine patch   Bronchitis improved   Wt down to 180s   Declines mammogram   : -COVID-19 vaccine status: 1 declines others   ROS: See pertinent positives and negatives per HPI.  Past Medical History:  Diagnosis Date  . Asthma   . Autoimmune hepatitis ([place])   . Depression   . Diabetes mellitus without complication ([place])   . DUB (dysfunctional uterine bleeding)    2008 s/p hysterectomy  . Exposure to TB 2005   tx'ed x 6 months  . Gallstones   . Gastric ulcer   . GERD (gastroesophageal reflux disease)   . History of chicken pox   . Hx of migraines   . Liver disease    very low liver function, auto-immune hepatitis  . Motion sickness   . Osteoarthritis   . Systemic lupus erythematosus ([place]) 05/31/2010   Qualifier: Diagnosis of  By: Deborra Medina MD, Tanja Port    . Vitamin D deficiency     Past Surgical History:  Procedure Laterality Date  . ABDOMINAL HYSTERECTOMY    . BACK SURGERY     L4/5 herniated disc repair 1992   . HALLUX VALGUS AUSTIN Left 02/09/2019   Procedure: HALLUX VALGUS AUSTIN ([facility]);  Surgeon: [name], DPM;  Location: [facility];  Service: Podiatry;  Laterality:  Left;  General with local  . HAMMER TOE SURGERY Left 02/09/2019   Procedure: HAMMER TOE CORRECTION;  Surgeon: [name], DPM;  Location: [facility];  Service: Podiatry;  Laterality: Left;  Diabetic - oral meds  . LAPAROSCOPIC HYSTERECTOMY    . TUBAL LIGATION       Current Outpatient Medications:  .  albuterol (PROAIR HFA) 108 (90 Base) MCG/ACT inhaler, Inhale 1-2 puffs into the lungs every 6 (six) hours as needed for wheezing or shortness of breath., Disp: 54 g, Rfl: 3 .  azaTHIOprine (IMURAN) 50 MG tablet, Take 100 mg by mouth 2 (two) times daily. , Disp: , Rfl:  .  blood glucose meter kit and supplies KIT, Dispense based on patient and insurance preference. Use up to four times daily as directed. (FOR ICD-9 250.00, 250.01)., Disp: 1 each, Rfl: 0 .  blood glucose meter kit and supplies, Dispense based on patient and insurance preference. Use up to four times daily as directed. (FOR ICD-10 E10.9, E11.9)., Disp: 1 each, Rfl: 0 .  Blood Glucose Monitoring Suppl (ONE TOUCH ULTRA SYSTEM KIT) W/DEVICE KIT, Test blood sugar as directed Dx E09.9, Disp: 1 each, Rfl: 0 .  calcium-vitamin D (OSCAL WITH D) 500-200 MG-UNIT per tablet, Take 1 tablet by mouth., Disp: , Rfl:  .  Cholecalciferol (VITAMIN D3) 5000 units TABS, Take by mouth. , Disp: ,  Rfl:  .  diclofenac sodium (VOLTAREN) 1 % GEL, Apply topically 4 (four) times daily., Disp: , Rfl:  .  glucose blood (ONETOUCH VERIO) test strip, 1 each by Other route 4 (four) times daily. Use as instructed, Disp: 400 strip, Rfl: 3 .  lactulose (CHRONULAC) 10 GM/15ML solution, TAKE 30 ML BY MOUTH 4 TIMES DAILY AS NEEDED (Patient taking differently: Take 30 g by mouth. 50-60 g 1x per day not 30 g qid), Disp: 10800 mL, Rfl: 3 .  lidocaine (LIDODERM) 5 %, Place 1 patch onto the skin daily. Remove & Discard patch within 12 hours or as directed by MD, Disp: 30 patch, Rfl: 5 .  loratadine (CLARITIN) 10 MG tablet, Take 10 mg by mouth every other day as needed  for allergies., Disp: , Rfl:  .  Magnesium Oxide 500 MG TABS, Take by mouth., Disp: , Rfl:  .  metFORMIN (GLUCOPHAGE-XR) 500 MG 24 hr tablet, Take 2 tablets (1,000 mg total) by mouth 2 (two) times daily with a meal., Disp: 360 tablet, Rfl: 3 .  pneumococcal 23 valent vaccine (PNEUMOVAX-23) 25 MCG/0.5ML injection, Inject 0.5 mLs into the muscle once for 1 dose., Disp: 0.5 mL, Rfl: 0 .  predniSONE (DELTASONE) 1 MG tablet, Take 4 mg by mouth daily with breakfast., Disp: , Rfl:  .  spironolactone (ALDACTONE) 50 MG tablet, TAKE 1 TABLET BY MOUTH EVERY DAY, Disp: 90 tablet, Rfl: 1 .  Tdap (BOOSTRIX) 5-2.5-18.5 LF-MCG/0.5 injection, Inject 0.5 mLs into the muscle once for 1 dose., Disp: 0.5 mL, Rfl: 0 .  vitamin B-12 (CYANOCOBALAMIN) 100 MCG tablet, Take 100 mcg by mouth daily., Disp: , Rfl:  .  insulin degludec (TRESIBA FLEXTOUCH) 100 UNIT/ML FlexTouch Pen, Inject 10 Units into the skin daily., Disp: 15 mL, Rfl: 11 .  Insulin Pen Needle (PEN NEEDLES) 31G X 6 MM MISC, Use with [place] as instructed daily, Disp: 100 each, Rfl: 11 .  pregabalin (LYRICA) 75 MG capsule, Take 75 mg by mouth 2 (two) times daily. (Patient not taking: Reported on 09/13/2020), Disp: , Rfl:  .  traMADol (ULTRAM) 50 MG tablet, TAKE 1 TABLET BY MOUTH EVERY 8 HOURS AS NEEDED (Patient not taking: No sig reported), Disp: 60 tablet, Rfl: 0  EXAM:  VITALS per patient if applicable:  GENERAL: alert, oriented, appears well and in no acute distress  HEENT: atraumatic, conjunttiva clear, no obvious abnormalities on inspection of external nose and ears  NECK: normal movements of the head and neck  LUNGS: on inspection no signs of respiratory distress, breathing rate appears normal, no obvious gross SOB, gasping or wheezing  CV: no obvious cyanosis  MS: moves all visible extremities without noticeable abnormality  PSYCH/NEURO: pleasant and cooperative, no obvious depression or anxiety, speech and thought processing grossly  intact  ASSESSMENT AND PLAN:  Discussed the following assessment and plan: 6.1 07/25/20  Controlled Type 2 diabetes mellitus without complication, without long-term current use of insulin (HCC) - Plan: insulin degludec (TRESIBA FLEXTOUCH) 100 UNIT/ML FlexTouch Pen 10 units qd and metformin xr 1000 mg bid  pna 23 vaccines wants to do 11/2020 with labs  Elevated liver enzymes with h/o AIH, SLE Avoid ssris, statins can cause elevatd lfts and hepatotoxic meds    HM-CPE in person next visit insurance does not cover virtual cpes Flu shotwill consider late 2021/early 2022has not had yet as of 04/11/20 Consider shingrix in future  prevnar had 04/24/14 -->consider pna 23 in future sent to pharmacy pt may want here 11/2020 Tdap  had 6/25/12sent to pharmacy for 2022  Hep A/B immune 1/2 moderna refuses 2nd dose  rec healthy diet and smoking cessation  Pap had 03/31/07 cant exclude HSIL s/p hysterectomy for DUB in 2008 given history rec pt to think about it -pap11/13/2020 negative  Mammogram pt declined on multiple occasionsdeclined today 03/24/2019 and declined5/18/21, 04/11/20, 09/13/20   DEXA 10/05/15 normal, 10/06/17 lumbar and femur neck osteopenia  Vitamin D 22.8 07/13/17-rec D3 5000 IU qd   Colonoscopy 07/08/16 diverticulosis, polpys x 3 hyperplastic, IH [facility] q 10 years  EGD 10/30/17 mild portal hypertensive gastropathy in gastric body   HCV neg 03/19/10, hep B sAg neg 03/19/10 declinesHIV  Smoking x 40 years 1 pk lasts 2 days max 1 ppd FH mom lung was smoker -rec cessation  [facility] saw 06/22/19 no toxic maculopathy, ocp normal f/u hydroxychloroquine yearly Dr. Edison Pace rec healthy diet and exercise  -we discussed possible serious and likely etiologies, options for evaluation and workup, limitations of telemedicine visit vs in person visit, treatment, treatment risks and precautions.    Discussed options for inperson care if PCP office not available. Did let this  patient know that I only do telemedicine on Tuesdays and Thursdays for [place]. Advised to schedule follow up visit with PCP or [facility] if any further questions or concerns to avoid delays in care.   I discussed the assessment and treatment plan with the patient. The patient was provided an opportunity to ask questions and all were answered. The patient agreed with the plan and demonstrated an understanding of the instructions.    Time spent 20 min Delorise Jackson, MD

## 2020-09-26 NOTE — Addendum Note (Signed)
Addended by: Orland Mustard on: 09/26/2020 06:35 PM   Modules accepted: Orders, Level of Service

## 2020-10-22 DIAGNOSIS — K7460 Unspecified cirrhosis of liver: Secondary | ICD-10-CM | POA: Diagnosis not present

## 2020-10-22 DIAGNOSIS — K754 Autoimmune hepatitis: Secondary | ICD-10-CM | POA: Diagnosis not present

## 2020-10-22 DIAGNOSIS — E0800 Diabetes mellitus due to underlying condition with hyperosmolarity without nonketotic hyperglycemic-hyperosmolar coma (NKHHC): Secondary | ICD-10-CM | POA: Diagnosis not present

## 2020-10-22 DIAGNOSIS — K862 Cyst of pancreas: Secondary | ICD-10-CM | POA: Diagnosis not present

## 2020-11-15 ENCOUNTER — Other Ambulatory Visit: Payer: Self-pay

## 2020-11-15 MED ORDER — OMEPRAZOLE 40 MG PO CPDR: 40.0000 mg | DELAYED_RELEASE_CAPSULE | Freq: Every day | ORAL | 1 refills | Status: DC

## 2020-11-27 ENCOUNTER — Ambulatory Visit (INDEPENDENT_AMBULATORY_CARE_PROVIDER_SITE_OTHER): Payer: Medicare Other

## 2020-11-27 ENCOUNTER — Other Ambulatory Visit (INDEPENDENT_AMBULATORY_CARE_PROVIDER_SITE_OTHER): Payer: Medicare Other

## 2020-11-27 ENCOUNTER — Other Ambulatory Visit: Payer: Self-pay

## 2020-11-27 DIAGNOSIS — Z23 Encounter for immunization: Secondary | ICD-10-CM | POA: Diagnosis not present

## 2020-11-27 DIAGNOSIS — E119 Type 2 diabetes mellitus without complications: Secondary | ICD-10-CM

## 2020-11-27 DIAGNOSIS — R748 Abnormal levels of other serum enzymes: Secondary | ICD-10-CM

## 2020-11-27 LAB — LIPID PANEL
Cholesterol: 123 mg/dL (ref 0–200)
HDL: 48.3 mg/dL (ref >39.00)
LDL Cholesterol: 60 mg/dL (ref 0–99)
NonHDL: 74.77
Total CHOL/HDL Ratio: 3
Triglycerides: 73 mg/dL (ref 0.0–149.0)
VLDL: 14.6 mg/dL (ref 0.0–40.0)

## 2020-11-27 NOTE — Progress Notes (Signed)
Pt presented for a Pneumovax 23, left deltoid, IM. Pt voiced no concerns.

## 2020-11-28 DIAGNOSIS — K7460 Unspecified cirrhosis of liver: Secondary | ICD-10-CM | POA: Diagnosis not present

## 2020-11-28 DIAGNOSIS — K8020 Calculus of gallbladder without cholecystitis without obstruction: Secondary | ICD-10-CM | POA: Diagnosis not present

## 2020-11-28 DIAGNOSIS — R932 Abnormal findings on diagnostic imaging of liver and biliary tract: Secondary | ICD-10-CM | POA: Diagnosis not present

## 2020-12-21 ENCOUNTER — Telehealth (INDEPENDENT_AMBULATORY_CARE_PROVIDER_SITE_OTHER): Payer: Medicare Other | Admitting: Family Medicine

## 2020-12-21 DIAGNOSIS — J40 Bronchitis, not specified as acute or chronic: Secondary | ICD-10-CM

## 2020-12-21 MED ORDER — AZITHROMYCIN 250 MG PO TABS
ORAL_TABLET | ORAL | 0 refills | Status: AC
Start: 2020-12-21 — End: 2020-12-26

## 2020-12-21 MED ORDER — NYSTATIN 100000 UNIT/ML MT SUSP: 5.0000 mL | Freq: Four times a day (QID) | OROMUCOSAL | 0 refills | Status: DC

## 2020-12-21 NOTE — Assessment & Plan Note (Signed)
Symptoms are consistent with bronchitis.  I discussed that this could be viral or bacterial.  Discussed that 2 negative home COVID tests would make COVID less likely.  We will proceed with treatment with azithromycin.  She does note she gets thrush with use of antibiotics and I will send nystatin for her to use in case she does get thrush.  I advised that if she is not improving by early next week she needs to let us know so we can get a chest x-ray.  I encouraged her to seek medical attention if she develops shortness of breath, cough productive of blood, chest pain, or fevers of 103 F or higher.

## 2020-12-21 NOTE — Progress Notes (Signed)
Virtual Visit via video Note  This visit type was conducted due to national recommendations for restrictions regarding the COVID-19 pandemic (e.g. social distancing).  This format is felt to be most appropriate for this patient at this time.  All issues noted in this document were discussed and addressed.  No physical exam was performed (except for noted visual exam findings with Video Visits).   I connected with Shannon [name] today at  4:00 PM EDT by a video enabled telemedicine application or telephone and verified that I am speaking with the correct person using two identifiers. Location patient: home Location provider: work or home office Persons participating in the virtual visit: patient, provider  I discussed the limitations, risks, security and privacy concerns of performing an evaluation and management service by telephone and the availability of in person appointments. I also discussed with the patient that there may be a patient responsible charge related to this service. The patient expressed understanding and agreed to proceed.  Reason for visit: same day visit  HPI: Bronchitis: Patient notes onset of cough and feeling poorly 1 week ago.  She took a COVID test at home the first day and it was negative.  She took a home COVID test on day 5 of her illness and that was negative as well.  She notes cough productive of frothy white mucus.  She feels congested in her chest.  T-max 99.3 F.  She does note some shortness of breath that was only after coughing.  Some rhinorrhea and postnasal drip.  No wheezing.  No taste or smell disturbances.  No known COVID exposure.  She has received 1 COVID-vaccine and did not get any other vaccines related to feeling poorly for 2 weeks after the vaccine.  She has been taking vitamin C and using her albuterol inhaler some.  She notes a history of getting bronchitis twice yearly.   ROS: See pertinent positives and negatives per HPI.  Past Medical  History:  Diagnosis Date   Asthma    Autoimmune hepatitis ([place])    Depression    Diabetes mellitus without complication ([place])    DUB (dysfunctional uterine bleeding)    2008 s/p hysterectomy   Exposure to TB 2005   tx'ed x 6 months   Gallstones    Gastric ulcer    GERD (gastroesophageal reflux disease)    History of chicken pox    Hx of migraines    Liver disease    very low liver function, auto-immune hepatitis   Motion sickness    Osteoarthritis    Systemic lupus erythematosus ([place]) 05/31/2010   Qualifier: Diagnosis of  By: Deborra Medina MD, Talia     Vitamin D deficiency     Past Surgical History:  Procedure Laterality Date   ABDOMINAL HYSTERECTOMY     BACK SURGERY     L4/5 herniated disc repair 1992    HALLUX VALGUS AUSTIN Left 02/09/2019   Procedure: HALLUX VALGUS AUSTIN ([place]);  Surgeon: [name], DPM;  Location: [facility];  Service: Podiatry;  Laterality: Left;  General with local   HAMMER TOE SURGERY Left 02/09/2019   Procedure: HAMMER TOE CORRECTION;  Surgeon: [name], DPM;  Location: [facility];  Service: Podiatry;  Laterality: Left;  Diabetic - oral meds   LAPAROSCOPIC HYSTERECTOMY     TUBAL LIGATION      Family History  Problem Relation Age of Onset   Cancer Mother        lung cancer smoker  Heart disease Father    Hypertension Father    Hypertension Sister    Sarcoidosis Sister        lung transplant failure   Heart disease Brother    Alcohol abuse Brother    Diabetes Sister    Diabetes Sister    Kidney disease Sister    Hypertension Sister    Rheum arthritis Sister    Diabetes Son    Hypertension Son     SOCIAL HX: Smoker   Current Outpatient Medications:    azithromycin (ZITHROMAX) 250 MG tablet, Take 2 tablets on day 1, then 1 tablet daily on days 2 through 5, Disp: 6 tablet, Rfl: 0   nystatin (MYCOSTATIN) 100000 UNIT/ML suspension, Take 5 mLs (500,000 Units total) by mouth 4 (four) times daily., Disp: 240 mL, Rfl:  0   predniSONE (DELTASONE) 1 MG tablet, Take 3 mg by mouth daily., Disp: , Rfl:    albuterol (PROAIR HFA) 108 (90 Base) MCG/ACT inhaler, Inhale 1-2 puffs into the lungs every 6 (six) hours as needed for wheezing or shortness of breath., Disp: 54 g, Rfl: 3   azaTHIOprine (IMURAN) 50 MG tablet, Take 100 mg by mouth 2 (two) times daily. , Disp: , Rfl:    blood glucose meter kit and supplies KIT, Dispense based on patient and insurance preference. Use up to four times daily as directed. (FOR ICD-9 250.00, 250.01)., Disp: 1 each, Rfl: 0   blood glucose meter kit and supplies, Dispense based on patient and insurance preference. Use up to four times daily as directed. (FOR ICD-10 E10.9, E11.9)., Disp: 1 each, Rfl: 0   Blood Glucose Monitoring Suppl (ONE TOUCH ULTRA SYSTEM KIT) W/DEVICE KIT, Test blood sugar as directed Dx E09.9, Disp: 1 each, Rfl: 0   calcium-vitamin D (OSCAL WITH D) 500-200 MG-UNIT per tablet, Take 1 tablet by mouth., Disp: , Rfl:    Cholecalciferol (VITAMIN D3) 5000 units TABS, Take by mouth. , Disp: , Rfl:    glucose blood (ONETOUCH VERIO) test strip, 1 each by Other route 4 (four) times daily. Use as instructed, Disp: 400 strip, Rfl: 3   insulin degludec (TRESIBA FLEXTOUCH) 100 UNIT/ML FlexTouch Pen, Inject 10 Units into the skin daily., Disp: 15 mL, Rfl: 11   Insulin Pen Needle (PEN NEEDLES) 31G X 6 MM MISC, Use with [place] as instructed daily, Disp: 100 each, Rfl: 11   lactulose (CHRONULAC) 10 GM/15ML solution, TAKE 30 ML BY MOUTH 4 TIMES DAILY AS NEEDED (Patient taking differently: Take 30 g by mouth. 50-60 g 1x per day not 30 g qid), Disp: 10800 mL, Rfl: 3   lidocaine (LIDODERM) 5 %, Place 1 patch onto the skin daily. Remove & Discard patch within 12 hours or as directed by MD, Disp: 30 patch, Rfl: 5   loratadine (CLARITIN) 10 MG tablet, Take 10 mg by mouth every other day as needed for allergies., Disp: , Rfl:    Magnesium Oxide 500 MG TABS, Take by mouth., Disp: , Rfl:     metFORMIN (GLUCOPHAGE-XR) 500 MG 24 hr tablet, Take 2 tablets (1,000 mg total) by mouth 2 (two) times daily with a meal., Disp: 360 tablet, Rfl: 3   omeprazole (PRILOSEC) 40 MG capsule, Take 1 capsule (40 mg total) by mouth daily. 30 min. Before a meal, Disp: 90 capsule, Rfl: 1   vitamin B-12 (CYANOCOBALAMIN) 100 MCG tablet, Take 100 mcg by mouth daily., Disp: , Rfl:   EXAM:  VITALS per patient if applicable:  GENERAL: alert, oriented,  appears well and in no acute distress  HEENT: atraumatic, conjunttiva clear, no obvious abnormalities on inspection of external nose and ears  NECK: normal movements of the head and neck  LUNGS: on inspection no signs of respiratory distress, breathing rate appears normal, no obvious gross SOB, gasping or wheezing  CV: no obvious cyanosis  MS: moves all visible extremities without noticeable abnormality  PSYCH/NEURO: pleasant and cooperative, no obvious depression or anxiety, speech and thought processing grossly intact  ASSESSMENT AND PLAN:  Discussed the following assessment and plan:  Problem List Items Addressed This Visit     Bronchitis    Symptoms are consistent with bronchitis.  I discussed that this could be viral or bacterial.  Discussed that 2 negative home COVID tests would make COVID less likely.  We will proceed with treatment with azithromycin.  She does note she gets thrush with use of antibiotics and I will send nystatin for her to use in case she does get thrush.  I advised that if she is not improving by early next week she needs to let us know so we can get a chest x-ray.  I encouraged her to seek medical attention if she develops shortness of breath, cough productive of blood, chest pain, or fevers of 103 F or higher.      Relevant Medications   azithromycin (ZITHROMAX) 250 MG tablet    Return if symptoms worsen or fail to improve.   I discussed the assessment and treatment plan with the patient. The patient was provided an  opportunity to ask questions and all were answered. The patient agreed with the plan and demonstrated an understanding of the instructions.   The patient was advised to call back or seek an in-person evaluation if the symptoms worsen or if the condition fails to improve as anticipated.    Tommi Rumps, MD

## 2021-02-14 ENCOUNTER — Ambulatory Visit
Admission: EM | Admit: 2021-02-14 | Discharge: 2021-02-14 | Disposition: A | Discharge status: Home / Self Care | Payer: Medicare Other | Attending: Internal Medicine | Admitting: Internal Medicine

## 2021-02-14 ENCOUNTER — Other Ambulatory Visit: Payer: Self-pay

## 2021-02-14 ENCOUNTER — Encounter: Payer: Self-pay | Admitting: Emergency Medicine

## 2021-02-14 DIAGNOSIS — F17200 Nicotine dependence, unspecified, uncomplicated: Secondary | ICD-10-CM | POA: Diagnosis not present

## 2021-02-14 DIAGNOSIS — J209 Acute bronchitis, unspecified: Secondary | ICD-10-CM

## 2021-02-14 MED ORDER — DOXYCYCLINE HYCLATE 100 MG PO CAPS: 100.0000 mg | ORAL_CAPSULE | Freq: Two times a day (BID) | ORAL | 0 refills | Status: DC

## 2021-02-14 NOTE — Discharge Instructions (Addendum)
Use your Albuterol inhaler 2 puffs 4 times a day for 3 days, then as needed for cough and wheezing Please work on not smoking since you are sick, and continue quitting to smoke

## 2021-02-14 NOTE — ED Provider Notes (Signed)
[facility]    CSN: [id number] Arrival date & time: 02/14/21  0800      History   Chief Complaint Chief Complaint  Patient presents with   Nasal Congestion   Facial Pain    HPI Shannon [name] is a 65 y.o. female who presents with nose congestion and face pressure x 1 week. Has had low grade temp of 99. Has been having cough attacks and wheezing. Has not been using her inhaler. She is still a smoker working on quitting. Is down to 1/2 ppd from 1 ppd Had 2 neg home Covid tests. Denies body aches, change  in her appetite. Has been doing netie pot saline nose rinses x 2 days.     Past Medical History:  Diagnosis Date   Asthma    Autoimmune hepatitis ([place])    Depression    Diabetes mellitus without complication ([place])    DUB (dysfunctional uterine bleeding)    2008 s/p hysterectomy   Exposure to TB 2005   tx'ed x 6 months   Gallstones    Gastric ulcer    GERD (gastroesophageal reflux disease)    History of chicken pox    Hx of migraines    Liver disease    very low liver function, auto-immune hepatitis   Motion sickness    Osteoarthritis    Systemic lupus erythematosus ([place]) 05/31/2010   Qualifier: Diagnosis of  By: Deborra Medina MD, Talia     Vitamin D deficiency     Patient Active Problem List   Diagnosis Date Noted   Leukopenia 04/11/2020   Elevated liver enzymes 04/11/2020   Adrenal insufficiency due to corticosteroid withdrawal ([place]) 01/02/2020   Hirsutism 09/27/2019   Osteoarthritis of both shoulders 09/27/2019   Type 2 diabetes mellitus without complication, without long-term current use of insulin ([place]) 03/25/2019   Gallstones 10/05/2018   Mild intermittent asthma with exacerbation 04/07/2018   History of colon polyps 03/03/2018   Cirrhosis of liver ([place]) 02/26/2018   Sacroiliac joint pain 02/26/2018   Arthritis 02/26/2018   Chronic pain 02/26/2018   History of asthma 02/26/2018   Bronchitis 08/23/2017   Portal hypertensive gastropathy ([place])  01/23/2015   Fatigue 01/23/2015   HLD (hyperlipidemia) 11/23/2013   Tobacco abuse 11/23/2013   Fibromyalgia 08/16/2013   Abdominal bloating 08/16/2013   Lumbosacral radiculopathy at L5 03/26/2012   OA (osteoarthritis) of knee 03/26/2012   Candidiasis 10/08/2011   Hyperpigmentation 11/04/2010   Adjustment disorder 11/04/2010   Steroid-induced diabetes ([place]) 05/31/2010   Vitamin D deficiency 05/31/2010   Autoimmune hepatitis ([place]) 05/31/2010   Systemic lupus erythematosus ([place]) 05/31/2010    Past Surgical History:  Procedure Laterality Date   ABDOMINAL HYSTERECTOMY     BACK SURGERY     L4/5 herniated disc repair 1992    HALLUX VALGUS AUSTIN Left 02/09/2019   Procedure: HALLUX VALGUS AUSTIN ([place]);  Surgeon: [name], DPM;  Location: [place];  Service: Podiatry;  Laterality: Left;  General with local   HAMMER TOE SURGERY Left 02/09/2019   Procedure: HAMMER TOE CORRECTION;  Surgeon: [name], DPM;  Location: [place];  Service: Podiatry;  Laterality: Left;  Diabetic - oral meds   LAPAROSCOPIC HYSTERECTOMY     TUBAL LIGATION      OB History   No obstetric history on file.      Home Medications    Prior to Admission medications   Medication Sig Start Date End Date Taking? Authorizing Provider  doxycycline (VIBRAMYCIN) 100 MG  capsule Take 1 capsule (100 mg total) by mouth 2 (two) times daily. 02/14/21  Yes Rodriguez-Southworth, Sunday Spillers, PA-C  albuterol (PROAIR HFA) 108 (90 Base) MCG/ACT inhaler Inhale 1-2 puffs into the lungs every 6 (six) hours as needed for wheezing or shortness of breath. 08/10/20   McLean-Scocuzza, Nino Glow, MD  azaTHIOprine (IMURAN) 50 MG tablet Take 100 mg by mouth 2 (two) times daily.     [provider]  blood glucose meter kit and supplies KIT Dispense based on patient and insurance preference. Use up to four times daily as directed. (FOR ICD-9 250.00, 250.01). 01/02/20   Marval Regal, NP  blood glucose meter  kit and supplies Dispense based on patient and insurance preference. Use up to four times daily as directed. (FOR ICD-10 E10.9, E11.9). 01/02/20   Marval Regal, NP  Blood Glucose Monitoring Suppl (ONE TOUCH ULTRA SYSTEM KIT) W/DEVICE KIT Test blood sugar as directed Dx E09.9 04/28/14   Lucille Passy, MD  calcium-vitamin D (OSCAL WITH D) 500-200 MG-UNIT per tablet Take 1 tablet by mouth.    [provider]  Cholecalciferol (VITAMIN D3) 5000 units TABS Take by mouth.     [provider]  glucose blood (ONETOUCH VERIO) test strip 1 each by Other route 4 (four) times daily. Use as instructed 05/15/20   McLean-Scocuzza, Nino Glow, MD  insulin degludec (TRESIBA FLEXTOUCH) 100 UNIT/ML FlexTouch Pen Inject 10 Units into the skin daily. 09/13/20   McLean-Scocuzza, Nino Glow, MD  Insulin Pen Needle (PEN NEEDLES) 31G X 6 MM MISC Use with [place] as instructed daily 09/13/20   McLean-Scocuzza, Nino Glow, MD  lactulose (CHRONULAC) 10 GM/15ML solution TAKE 30 ML BY MOUTH 4 TIMES DAILY AS NEEDED Patient taking differently: Take 30 g by mouth. 50-60 g 1x per day not 30 g qid 09/26/14   Lucille Passy, MD  lidocaine (LIDODERM) 5 % Place 1 patch onto the skin daily. Remove & Discard patch within 12 hours or as directed by MD 09/22/12   Owens Loffler, MD  loratadine (CLARITIN) 10 MG tablet Take 10 mg by mouth every other day as needed for allergies.    [provider]  Magnesium Oxide 500 MG TABS Take by mouth.    [provider]  metFORMIN (GLUCOPHAGE-XR) 500 MG 24 hr tablet Take 2 tablets (1,000 mg total) by mouth 2 (two) times daily with a meal. 04/04/20   McLean-Scocuzza, Nino Glow, MD  nystatin (MYCOSTATIN) 100000 UNIT/ML suspension Take 5 mLs (500,000 Units total) by mouth 4 (four) times daily. 12/21/20   Leone Haven, MD  omeprazole (PRILOSEC) 40 MG capsule Take 1 capsule (40 mg total) by mouth daily. 30 min. Before a meal 11/15/20   McLean-Scocuzza, Nino Glow, MD  predniSONE  (DELTASONE) 1 MG tablet Take 3 mg by mouth daily. 10/25/20   [provider]  vitamin B-12 (CYANOCOBALAMIN) 100 MCG tablet Take 100 mcg by mouth daily.    [provider]    Family History Family History  Problem Relation Age of Onset   Cancer Mother        lung cancer smoker   Heart disease Father    Hypertension Father    Hypertension Sister    Sarcoidosis Sister        lung transplant failure   Heart disease Brother    Alcohol abuse Brother    Diabetes Sister    Diabetes Sister    Kidney disease Sister    Hypertension Sister  Rheum arthritis Sister    Diabetes Son    Hypertension Son     Social History Social History   Tobacco Use   Smoking status: Every Day    Packs/day: 0.50    Years: 30.00    Pack years: 15.00    Types: Cigarettes   Smokeless tobacco: Never  Vaping Use   Vaping Use: Never used  Substance Use Topics   Alcohol use: No   Drug use: No     Allergies   Hydrocodone, Lipitor [atorvastatin calcium], Milk-related compounds, Oxycodone, and Adhesive [tape]   Review of Systems Review of Systems  Constitutional:  Positive for fever. Negative for appetite change, chills, diaphoresis and fatigue.  HENT:  Positive for congestion, postnasal drip, rhinorrhea and sinus pressure. Negative for ear discharge, ear pain, sore throat and trouble swallowing.   Eyes:  Negative for discharge.  Respiratory:  Positive for cough and wheezing. Negative for shortness of breath.   Musculoskeletal:  Negative for myalgias.  Skin:  Negative for rash.  Neurological:  Negative for headaches.  Hematological:  Negative for adenopathy.    Physical Exam Triage Vital Signs ED Triage Vitals  Enc Vitals Group     BP 02/14/21 0816 123/89     Pulse Rate 02/14/21 0816 74     Resp 02/14/21 0816 18     Temp 02/14/21 0816 98 F (36.7 C)     Temp Source 02/14/21 0816 Oral     SpO2 02/14/21 0816 100 %     Weight --      Height --      Head Circumference --       Peak Flow --      Pain Score 02/14/21 0814 6     Pain Loc --      Pain Edu? --      Excl. in [place]? --    No data found.  Updated Vital Signs BP 123/89 (BP Location: Left Arm)   Pulse 74   Temp 98 F (36.7 C) (Oral)   Resp 18   SpO2 100%   Visual Acuity Right Eye Distance:   Left Eye Distance:   Bilateral Distance:    Right Eye Near:   Left Eye Near:    Bilateral Near:     Physical Exam Physical Exam Constitutional:      General: SHe is not in acute distress.    Appearance: SHe is not toxic-appearing.  HENT:     Head: Normocephalic.     Right Ear: Tympanic membrane, ear canal and external ear normal.     Left Ear: Ear canal and external ear normal.     Nose: with clear rhinitis and mild R maxillary sinus tenderness    Mouth/Throat:     Mouth: Mucous membranes are moist.     Pharynx: Oropharynx is clear.  Eyes:     General: No scleral icterus.    Conjunctiva/sclera: Conjunctivae normal.  Cardiovascular:     Rate and Rhythm: Normal rate and regular rhythm.     Heart sounds: No murmur heard.   Pulmonary:     Effort: Pulmonary effort is normal. No respiratory distress.     Breath sounds: Wheezing present.     Comments: Has auditory wheezing Musculoskeletal:        General: Normal range of motion.     Cervical back: Neck supple.  Lymphadenopathy:     Cervical: No cervical adenopathy.  Skin:    General: Skin is warm and dry.  Findings: No rash.  Neurological:     Mental Status: He is alert and oriented to person, place, and time.     Gait: Gait normal.  Psychiatric:        Mood and Affect: Mood normal.        Behavior: Behavior normal.        Thought Content: Thought content normal.        Judgment: Judgment normal.    UC Treatments / Results  Labs (all labs ordered are listed, but only abnormal results are displayed) Labs Reviewed - No data to display  EKG   Radiology No results found.  Procedures Procedures (including critical care  time)  Medications Ordered in UC Medications - No data to display  Initial Impression / Assessment and Plan / UC Course  I have reviewed the triage vital signs and the nursing notes. Bronchitis. I placed her on Doxy as noted. And advised to use her inhaler. See instructions.      Final Clinical Impressions(s) / UC Diagnoses   Final diagnoses:  Acute bronchitis, unspecified organism  Smoker     Discharge Instructions      Use your Albuterol inhaler 2 puffs 4 times a day for 3 days, then as needed for cough and wheezing Please work on not smoking since you are sick, and continue quitting to smoke     ED Prescriptions     Medication Sig Dispense Auth. Provider   doxycycline (VIBRAMYCIN) 100 MG capsule Take 1 capsule (100 mg total) by mouth 2 (two) times daily. 20 capsule Rodriguez-Southworth, Sunday Spillers, PA-C      PDMP not reviewed this encounter.   Shelby Mattocks, PA-C 02/14/21 1115

## 2021-02-14 NOTE — ED Triage Notes (Signed)
Pt presents today with c/o of nasal congestion and facial/sinus pain x 1 week with low grade temp. Two home Covid tests=neg. She declines Covid testing today.

## 2021-02-20 DIAGNOSIS — R062 Wheezing: Secondary | ICD-10-CM | POA: Diagnosis not present

## 2021-02-20 DIAGNOSIS — R059 Cough, unspecified: Secondary | ICD-10-CM | POA: Diagnosis not present

## 2021-02-28 ENCOUNTER — Telehealth: Payer: Self-pay | Admitting: Internal Medicine

## 2021-02-28 DIAGNOSIS — J0100 Acute maxillary sinusitis, unspecified: Secondary | ICD-10-CM | POA: Diagnosis not present

## 2021-02-28 DIAGNOSIS — J0110 Acute frontal sinusitis, unspecified: Secondary | ICD-10-CM | POA: Diagnosis not present

## 2021-02-28 DIAGNOSIS — R059 Cough, unspecified: Secondary | ICD-10-CM | POA: Diagnosis not present

## 2021-02-28 NOTE — Telephone Encounter (Signed)
Please advise  does Patient need a statin?

## 2021-02-28 NOTE — Telephone Encounter (Signed)
Morgan,RN from [employer] is calling in to check in to see why the patient is not on a statin.Please call her at [phone] will also be sending over a fax on the patient.

## 2021-03-01 NOTE — Telephone Encounter (Signed)
Not rec she take statin due to liver issues

## 2021-03-01 NOTE — Telephone Encounter (Signed)
Noted will await fax to send back

## 2021-03-04 NOTE — Telephone Encounter (Signed)
Called and informed the nurse with [place] [facility]

## 2021-03-19 ENCOUNTER — Telehealth (INDEPENDENT_AMBULATORY_CARE_PROVIDER_SITE_OTHER): Payer: Medicare Other | Admitting: Internal Medicine

## 2021-03-19 ENCOUNTER — Encounter: Payer: Self-pay | Admitting: Internal Medicine

## 2021-03-19 VITALS — BP 110/75 | HR 73 | Wt 182.0 lb

## 2021-03-19 DIAGNOSIS — Z1329 Encounter for screening for other suspected endocrine disorder: Secondary | ICD-10-CM

## 2021-03-19 DIAGNOSIS — B3781 Candidal esophagitis: Secondary | ICD-10-CM

## 2021-03-19 DIAGNOSIS — N3000 Acute cystitis without hematuria: Secondary | ICD-10-CM | POA: Diagnosis not present

## 2021-03-19 DIAGNOSIS — R5383 Other fatigue: Secondary | ICD-10-CM | POA: Diagnosis not present

## 2021-03-19 DIAGNOSIS — E119 Type 2 diabetes mellitus without complications: Secondary | ICD-10-CM

## 2021-03-19 DIAGNOSIS — B370 Candidal stomatitis: Secondary | ICD-10-CM | POA: Diagnosis not present

## 2021-03-19 DIAGNOSIS — F419 Anxiety disorder, unspecified: Secondary | ICD-10-CM

## 2021-03-19 MED ORDER — FLUCONAZOLE 150 MG PO TABS: 150.0000 mg | ORAL_TABLET | Freq: Once | ORAL | 0 refills | Status: DC

## 2021-03-19 MED ORDER — NYSTATIN 100000 UNIT/ML MT SUSP: 5.0000 mL | Freq: Four times a day (QID) | OROMUCOSAL | 0 refills | Status: DC

## 2021-03-19 MED ORDER — FLUCONAZOLE 150 MG PO TABS: 150.0000 mg | ORAL_TABLET | Freq: Every day | ORAL | 0 refills | Status: DC

## 2021-03-19 NOTE — Progress Notes (Addendum)
Telephone Note  I connected with Shela Leff  on 03/19/21 at 11:25 AM EST by telephone and verified that I am speaking with the correct person using two identifiers.  Location patient: home, [place] Location provider:work or home office Persons participating in the virtual visit: patient, provider  I discussed the limitations of evaluation and management by telemedicine and the availability of in person appointments. The patient expressed understanding and agreed to proceed.   HPI:  Acute telemedicine visit for : -1. Thrush tongue and throat has been on 3 10 day courses of Abx since 02/2021 for URI/cough with respiratory sxs rsv covid and flu neg 02/2021 and cxr 02/20/21 negative she had a fever of 102.9 max and 8 days of fever now resolved  She is smoking  Declines referral to pulm and ct chest for now will let me know  2. C/o urgency and frequency will check urine  3. Dm 2 a1c 10/2020 5.8 will check fasting labs [facility] [place]   ROS: See pertinent positives and negatives per HPI.  Past Medical History:  Diagnosis Date   Asthma    Autoimmune hepatitis ([place])    Depression    Diabetes mellitus without complication ([place])    DUB (dysfunctional uterine bleeding)    2008 s/p hysterectomy   Exposure to TB 2005   tx'ed x 6 months   Gallstones    Gastric ulcer    GERD (gastroesophageal reflux disease)    History of chicken pox    Hx of migraines    Liver disease    very low liver function, auto-immune hepatitis   Motion sickness    Osteoarthritis    Systemic lupus erythematosus ([place]) 05/31/2010   Qualifier: Diagnosis of  By: Deborra Medina MD, Talia     Vitamin D deficiency     Past Surgical History:  Procedure Laterality Date   ABDOMINAL HYSTERECTOMY     BACK SURGERY     L4/5 herniated disc repair 1992    HALLUX VALGUS AUSTIN Left 02/09/2019   Procedure: HALLUX VALGUS AUSTIN ([place]);  Surgeon: [name], DPM;  Location: [facility];  Service: Podiatry;  Laterality:  Left;  General with local   HAMMER TOE SURGERY Left 02/09/2019   Procedure: HAMMER TOE CORRECTION;  Surgeon: [name], DPM;  Location: [facility];  Service: Podiatry;  Laterality: Left;  Diabetic - oral meds   LAPAROSCOPIC HYSTERECTOMY     TUBAL LIGATION       Current Outpatient Medications:    albuterol (PROAIR HFA) 108 (90 Base) MCG/ACT inhaler, Inhale 1-2 puffs into the lungs every 6 (six) hours as needed for wheezing or shortness of breath., Disp: 54 g, Rfl: 3   azaTHIOprine (IMURAN) 50 MG tablet, Take 100 mg by mouth 2 (two) times daily. , Disp: , Rfl:    blood glucose meter kit and supplies KIT, Dispense based on patient and insurance preference. Use up to four times daily as directed. (FOR ICD-9 250.00, 250.01)., Disp: 1 each, Rfl: 0   blood glucose meter kit and supplies, Dispense based on patient and insurance preference. Use up to four times daily as directed. (FOR ICD-10 E10.9, E11.9)., Disp: 1 each, Rfl: 0   Blood Glucose Monitoring Suppl (ONE TOUCH ULTRA SYSTEM KIT) W/DEVICE KIT, Test blood sugar as directed Dx E09.9, Disp: 1 each, Rfl: 0   calcium-vitamin D (OSCAL WITH D) 500-200 MG-UNIT per tablet, Take 1 tablet by mouth., Disp: , Rfl:    Cholecalciferol (VITAMIN D3) 5000 units TABS, Take by  mouth. , Disp: , Rfl:    glucose blood (ONETOUCH VERIO) test strip, 1 each by Other route 4 (four) times daily. Use as instructed, Disp: 400 strip, Rfl: 3   insulin degludec (TRESIBA FLEXTOUCH) 100 UNIT/ML FlexTouch Pen, Inject 10 Units into the skin daily., Disp: 15 mL, Rfl: 11   Insulin Pen Needle (PEN NEEDLES) 31G X 6 MM MISC, Use with [place] as instructed daily, Disp: 100 each, Rfl: 11   lactulose (CHRONULAC) 10 GM/15ML solution, TAKE 30 ML BY MOUTH 4 TIMES DAILY AS NEEDED (Patient taking differently: Take 30 g by mouth. 50-60 g 1x per day not 30 g qid), Disp: 10800 mL, Rfl: 3   lidocaine (LIDODERM) 5 %, Place 1 patch onto the skin daily. Remove & Discard patch within 12  hours or as directed by MD, Disp: 30 patch, Rfl: 5   loratadine (CLARITIN) 10 MG tablet, Take 10 mg by mouth every other day as needed for allergies., Disp: , Rfl:    Magnesium Oxide 500 MG TABS, Take by mouth., Disp: , Rfl:    metFORMIN (GLUCOPHAGE-XR) 500 MG 24 hr tablet, Take 2 tablets (1,000 mg total) by mouth 2 (two) times daily with a meal., Disp: 360 tablet, Rfl: 3   omeprazole (PRILOSEC) 40 MG capsule, Take 1 capsule (40 mg total) by mouth daily. 30 min. Before a meal, Disp: 90 capsule, Rfl: 1   predniSONE (DELTASONE) 1 MG tablet, Take 3 mg by mouth daily., Disp: , Rfl:    vitamin B-12 (CYANOCOBALAMIN) 100 MCG tablet, Take 100 mcg by mouth daily., Disp: , Rfl:    doxycycline (VIBRAMYCIN) 100 MG capsule, Take 1 capsule (100 mg total) by mouth 2 (two) times daily. (Patient not taking: Reported on 03/19/2021), Disp: 20 capsule, Rfl: 0   fluconazole (DIFLUCAN) 150 MG tablet, Take 1 tablet (150 mg total) by mouth daily. X 1 weeks, Disp: 7 tablet, Rfl: 0   hydroxychloroquine (PLAQUENIL) 200 MG tablet, Take 200 mg by mouth 2 (two) times daily., Disp: , Rfl:    nystatin (MYCOSTATIN) 100000 UNIT/ML suspension, Take 5 mLs (500,000 Units total) by mouth 4 (four) times daily. Hold and spit, Disp: 473 mL, Rfl: 0  EXAM:  VITALS per patient if applicable:  GENERAL: alert, oriented, appears well and in no acute distress  HEENT: atraumatic, conjunttiva clear, no obvious abnormalities on inspection of external nose and ears   PSYCH/NEURO: pleasant and cooperative, no obvious depression or anxiety, speech and thought processing grossly intact  ASSESSMENT AND PLAN:  Discussed the following assessment and plan:  Thrush/esophagitis in throat - Plan: nystatin (MYCOSTATIN) 100000 UNIT/ML suspension, fluconazole (DIFLUCAN) 150 MG tablet x 7 days Consider f/u with GI if not resolved  Acute cystitis without hematuria - Plan: Urinalysis, Routine w reflex microscopic, Urine Culture  Type 2 diabetes  mellitus without complication, without long-term current use of insulin (HCC) - Plan: Comprehensive metabolic panel, Lipid panel, CBC with Differential/Platelet, Hemoglobin A1c On tresiba 10 units qd metformin xr 1000 mg bid   Anxiety which causes her to smoke more  Rec cessation  Will try lexapro low dose  Spoke with Patient and shew would like to try Lexapro. States the Hydroxyzine would make her sleep too much.    Patient states she will contact [facility] to initiate therapy       Note     You  Thressa Sheller, CMA 2 days ago   TM Fix would be to get therapy for triggers or anxiety [facility] no  referral needed  Meds like ativan can be addictive and not rec  Vs  Hydroxyzine antihistamine non addictive better long term option as needed    OR  If daily option desired lexapro or celexa which takes about 4-6 weeks to work    Remove stressors is able  Which option does she want to try?  Rec pt establish with therapist above as well and they can help with stopping smoking as well as call [phone] quit now          Note    Thressa Sheller, CMA  You 6 days ago   Patient states she took Wellbutrin years ago and was having psychiatric effects. States she felt out of place.    Patient states she smokes more when she is anxious. Wanted a low dose anxiety medication as she states if she had less anxiety she feels she would not need to smoke.    Please advise        Note     You  Thressa Sheller, CMA 7 days ago   TM We do wellbutrin for smoking cessation not anxiety medication        Note    Thressa Sheller, CMA  You 7 days ago   Please advise on smoking cessation or anxiety medication       Note    Thea Gist routed conversation to Thressa Sheller, CMA 7 days ago   Thea Gist 7 days ago   JB Pt called in requesting for Dr. Olivia Mackie to prescribe her an anxiety medication so she can quit smoking. Pt stated that her and Dr. Olivia Mackie had spoke about it  at her last visit. Pt would like callback      Note    -we discussed possible serious and likely etiologies, options for evaluation and workup, limitations of telemedicine visit vs in person visit, treatment, treatment risks and precautions.    I discussed the assessment and treatment plan with the patient. The patient was provided an opportunity to ask questions and all were answered. The patient agreed with the plan and demonstrated an understanding of the instructions.    Time spent 20 minutes  Delorise Jackson, MD

## 2021-03-20 ENCOUNTER — Telehealth: Payer: Self-pay | Admitting: Internal Medicine

## 2021-03-20 DIAGNOSIS — E119 Type 2 diabetes mellitus without complications: Secondary | ICD-10-CM | POA: Diagnosis not present

## 2021-03-20 DIAGNOSIS — R5383 Other fatigue: Secondary | ICD-10-CM | POA: Diagnosis not present

## 2021-03-20 DIAGNOSIS — N3000 Acute cystitis without hematuria: Secondary | ICD-10-CM | POA: Diagnosis not present

## 2021-03-20 DIAGNOSIS — Z1329 Encounter for screening for other suspected endocrine disorder: Secondary | ICD-10-CM | POA: Diagnosis not present

## 2021-03-20 NOTE — Telephone Encounter (Signed)
Pt called in requesting for Dr. Olivia Mackie to prescribe her an anxiety medication so she can quit smoking. Pt stated that her and Dr. Olivia Mackie had spoke about it at her last visit. Pt would like callback

## 2021-03-20 NOTE — Telephone Encounter (Signed)
Please advise on smoking cessation or anxiety medication

## 2021-03-20 NOTE — Telephone Encounter (Signed)
We do wellbutrin for smoking cessation not anxiety medication

## 2021-03-21 LAB — URINALYSIS, ROUTINE W REFLEX MICROSCOPIC
Bilirubin, UA: NEGATIVE
Glucose, UA: NEGATIVE
Ketones, UA: NEGATIVE
Nitrite, UA: NEGATIVE
Protein,UA: NEGATIVE
RBC, UA: NEGATIVE
Specific Gravity, UA: 1.018 (ref 1.005–1.030)
Urobilinogen, Ur: 1 mg/dL (ref 0.2–1.0)
pH, UA: 6.5 (ref 5.0–7.5)

## 2021-03-21 LAB — CBC WITH DIFFERENTIAL/PLATELET
Basophils Absolute: 0 10*3/uL (ref 0.0–0.2)
Basos: 0 %
EOS (ABSOLUTE): 0.1 10*3/uL (ref 0.0–0.4)
Eos: 2 %
Hematocrit: 38.2 % (ref 34.0–46.6)
Hemoglobin: 12.8 g/dL (ref 11.1–15.9)
Immature Grans (Abs): 0 10*3/uL (ref 0.0–0.1)
Immature Granulocytes: 0 %
Lymphocytes Absolute: 3 10*3/uL (ref 0.7–3.1)
Lymphs: 56 %
MCH: 36.8 pg — ABNORMAL HIGH (ref 26.6–33.0)
MCHC: 33.5 g/dL (ref 31.5–35.7)
MCV: 110 fL — ABNORMAL HIGH (ref 79–97)
Monocytes Absolute: 0.5 10*3/uL (ref 0.1–0.9)
Monocytes: 8 %
Neutrophils Absolute: 1.9 10*3/uL (ref 1.4–7.0)
Neutrophils: 34 %
Platelets: 237 10*3/uL (ref 150–450)
RBC: 3.48 x10E6/uL — ABNORMAL LOW (ref 3.77–5.28)
RDW: 14.6 % (ref 11.7–15.4)
WBC: 5.5 10*3/uL (ref 3.4–10.8)

## 2021-03-21 LAB — HEMOGLOBIN A1C
Est. average glucose Bld gHb Est-mCnc: 131 mg/dL
Hgb A1c MFr Bld: 6.2 % — ABNORMAL HIGH (ref 4.8–5.6)

## 2021-03-21 LAB — COMPREHENSIVE METABOLIC PANEL
ALT: 50 IU/L — ABNORMAL HIGH (ref 0–32)
AST: 41 IU/L — ABNORMAL HIGH (ref 0–40)
Albumin/Globulin Ratio: 1.5 (ref 1.2–2.2)
Albumin: 4.1 g/dL (ref 3.8–4.8)
Alkaline Phosphatase: 70 IU/L (ref 44–121)
BUN/Creatinine Ratio: 12 (ref 12–28)
BUN: 10 mg/dL (ref 8–27)
Bilirubin Total: 0.3 mg/dL (ref 0.0–1.2)
CO2: 29 mmol/L (ref 20–29)
Calcium: 10.1 mg/dL (ref 8.7–10.3)
Chloride: 101 mmol/L (ref 96–106)
Creatinine, Ser: 0.81 mg/dL (ref 0.57–1.00)
Globulin, Total: 2.8 g/dL (ref 1.5–4.5)
Glucose: 98 mg/dL (ref 70–99)
Potassium: 4.5 mmol/L (ref 3.5–5.2)
Sodium: 143 mmol/L (ref 134–144)
Total Protein: 6.9 g/dL (ref 6.0–8.5)
eGFR: 82 mL/min/{1.73_m2} (ref >59)

## 2021-03-21 LAB — LIPID PANEL
Chol/HDL Ratio: 2.3 ratio (ref 0.0–4.4)
Cholesterol, Total: 159 mg/dL (ref 100–199)
HDL: 69 mg/dL (ref >39)
LDL Chol Calc (NIH): 78 mg/dL (ref 0–99)
Triglycerides: 58 mg/dL (ref 0–149)
VLDL Cholesterol Cal: 12 mg/dL (ref 5–40)

## 2021-03-21 LAB — MICROSCOPIC EXAMINATION
Casts: NONE SEEN /lpf
RBC, Urine: NONE SEEN /hpf (ref 0–2)

## 2021-03-21 LAB — TSH: TSH: 2.25 u[IU]/mL (ref 0.450–4.500)

## 2021-03-21 NOTE — Telephone Encounter (Signed)
Patient states she took Wellbutrin years ago and was having psychiatric effects. States she felt out of place.   Patient states she smokes more when she is anxious. Wanted a low dose anxiety medication as she states if she had less anxiety she feels she would not need to smoke.   Please advise

## 2021-03-25 ENCOUNTER — Encounter: Payer: Self-pay | Admitting: Internal Medicine

## 2021-03-25 LAB — URINE CULTURE

## 2021-03-25 NOTE — Telephone Encounter (Signed)
Fix would be to get therapy for triggers or anxiety [facility] no referral needed  Meds like ativan can be addictive and not rec  Vs  Hydroxyzine antihistamine non addictive better long term option as needed   OR  If daily option desired lexapro or celexa which takes about 4-6 weeks to work   Remove stressors is able  Which option does she want to try?  Rec pt establish with therapist above as well and they can help with stopping smoking as well as call [phone] quit now

## 2021-03-26 NOTE — Telephone Encounter (Signed)
Spoke with Patient and shew would like to try Lexapro. States the Hydroxyzine would make her sleep too much.   Patient states she will contact [facility] to initiate therapy

## 2021-03-27 DIAGNOSIS — F419 Anxiety disorder, unspecified: Secondary | ICD-10-CM | POA: Insufficient documentation

## 2021-03-27 MED ORDER — ESCITALOPRAM OXALATE 10 MG PO TABS: 10.0000 mg | ORAL_TABLET | Freq: Every day | ORAL | 3 refills | Status: DC

## 2021-03-27 NOTE — Addendum Note (Signed)
Addended by: Orland Mustard on: 03/27/2021 04:25 PM   Modules accepted: Orders

## 2021-03-27 NOTE — Telephone Encounter (Signed)
Sent!

## 2021-03-28 NOTE — Telephone Encounter (Signed)
I called and spoke with patient and informed her that her medication was sent to pharmacy, patient stated she picked it up this morning.   Jules Baty,cma

## 2021-04-08 ENCOUNTER — Other Ambulatory Visit: Payer: Self-pay | Admitting: Internal Medicine

## 2021-04-08 DIAGNOSIS — E119 Type 2 diabetes mellitus without complications: Secondary | ICD-10-CM

## 2021-04-09 ENCOUNTER — Telehealth (INDEPENDENT_AMBULATORY_CARE_PROVIDER_SITE_OTHER): Payer: Medicare Other | Admitting: Family Medicine

## 2021-04-09 DIAGNOSIS — U071 COVID-19: Secondary | ICD-10-CM

## 2021-04-09 MED ORDER — MOLNUPIRAVIR EUA 200MG CAPSULE: 4.0000 | ORAL_CAPSULE | Freq: Two times a day (BID) | ORAL | 0 refills | Status: AC

## 2021-04-09 MED ORDER — BENZONATATE 100 MG PO CAPS: ORAL_CAPSULE | ORAL | 0 refills | Status: DC

## 2021-04-09 NOTE — Patient Instructions (Signed)
HOME CARE TIPS:  -COVID19 testing information: [email or web address]  Most pharmacies also offer testing and home test kits. If the Covid19 test is positive and you desire antiviral treatment, please contact a [facility] or schedule a follow up virtual visit through your primary care office or through the [employer] [facility].  Other test to treat options: [email or web address]  -I sent the medication(s) we discussed to your pharmacy: Meds ordered this encounter  Medications   molnupiravir EUA (LAGEVRIO) 200 mg CAPS capsule    Sig: Take 4 capsules (800 mg total) by mouth 2 (two) times daily for 5 days.    Dispense:  40 capsule    Refill:  0   benzonatate (TESSALON PERLES) 100 MG capsule    Sig: 1-2 capsules up to twice daily for cough    Dispense:  40 capsule    Refill:  0     -I sent in the [place] treatment or referral you requested per our discussion. Please see the information provided below and discuss further with the pharmacist/treatment team.    -there is a chance of rebound illness after finishing your treatment. If you become sick again please isolate for an additional 5 days, plus 5 more days of masking.    -can use nasal saline a few times per day if you have nasal congestion; sometimes  a short course of Afrin nasal spray for 3 days can help with symptoms as well  -stay hydrated, drink plenty of fluids and eat small healthy meals - avoid dairy  -can take 1000 IU (41mg) Vit D3 and 100-500 mg of Vit C daily per instructions  -check out the CDC website for more information on home care, transmission and treatment for COVID19  -follow up with your doctor in 2-3 days unless improving and feeling better  -stay home while sick, except to seek medical care. If you have COVID19, ideally it would be best to stay home for a full 10 days since the onset of symptoms PLUS one day of no fever and feeling better. Wear a  good mask that fits snugly (such as N95 or KN95) if around others to reduce the risk of transmission.  It was nice to meet you today, and I really hope you are feeling better soon. I help [facility] out with telemedicine visits on Tuesdays and Thursdays and am available for visits on those days. If you have any concerns or questions following this visit please schedule a follow up visit with your Primary Care doctor or seek care at a local urgent care clinic to avoid delays in care.    Seek in person care or schedule a follow up video visit promptly if your symptoms worsen, new concerns arise or you are not improving with treatment. Call 911 and/or seek emergency care if your symptoms are severe or life threatening.    Fact Sheet for Patients And Caregivers Emergency Use Authorization (EUA) Of LAGEVRIOT (molnupiravir) capsules For Coronavirus Disease 2019 (COVID-19)  What is the most important information I should know about LAGEVRIO? LAGEVRIO may cause serious side effects, including: ? LAGEVRIO may cause harm to your unborn baby. It is not known if LAGEVRIO will harm your baby if you take LAGEVRIO during pregnancy. o LAGEVRIO is not recommended for use in pregnancy. o LAGEVRIO has not been studied in pregnancy. LAGEVRIO was studied in pregnant animals only. When LAGEVRIO was given to pregnant animals, LAGEVRIO caused harm to their unborn babies. o You and your healthcare provider may decide that you should  take LAGEVRIO during pregnancy if there are no other COVID-19 treatment options approved or authorized by the FDA that are accessible or clinically appropriate for you. o If you and your healthcare provider decide that you should take LAGEVRIO during pregnancy, you and your healthcare provider should discuss the known and potential benefits and the potential risks of taking LAGEVRIO during pregnancy. For individuals who are able to become pregnant: ? You should use a reliable method of  birth control (contraception) consistently and correctly during treatment with LAGEVRIO and for 4 days after the last dose of LAGEVRIO. Talk to your healthcare provider about reliable birth control methods. ? Before starting treatment with [facility] your healthcare provider may do a pregnancy test to see if you are pregnant before starting treatment with LAGEVRIO. ? Tell your healthcare provider right away if you become pregnant or think you may be pregnant during treatment with LAGEVRIO. Pregnancy Surveillance Program: ? There is a pregnancy surveillance program for individuals who take LAGEVRIO during pregnancy. The purpose of this program is to collect information about the health of you and your baby. Talk to your healthcare provider about how to take part in this program. ? If you take LAGEVRIO during pregnancy and you agree to participate in the pregnancy surveillance program and allow your healthcare provider to share your information with [place], then your healthcare provider will report your use of [place] during pregnancy to [place]. by calling [phone] or [email or web address]. For individuals who are sexually active with partners who are able to become pregnant: ? It is not known if LAGEVRIO can affect sperm. While the risk is regarded as low, animal studies to fully assess the potential for LAGEVRIO to affect the babies of males treated with LAGEVRIO have not been completed. A reliable method of birth control (contraception) should be used consistently and correctly during treatment with LAGEVRIO and for at least 3 months after the last dose. The risk to sperm beyond 3 months is not known. Studies to understand the risk to sperm beyond 3 months are ongoing. Talk to your healthcare provider about reliable birth control methods. Talk to your healthcare provider if you have questions or concerns about how LAGEVRIO may affect sperm. You are  being given this fact sheet because your healthcare provider believes it is necessary to provide you with LAGEVRIO for the treatment of adults with mild-to-moderate coronavirus disease 2019 (COVID-19) with positive results of direct SARS-CoV-2 viral testing, and who are at high risk for progression to severe COVID-19 including hospitalization or death, and for whom other COVID-19 treatment options approved or authorized by the FDA are not accessible or clinically appropriate. The U.S. Food and Drug Administration (FDA) has issued an Emergency Use Authorization (EUA) to make LAGEVRIO available during the COVID-19 pandemic (for more details about an EUA please see "What is an Emergency Use Authorization?" at the end of this document). LAGEVRIO is not an FDA-approved medicine in the [place]. Read this Fact Sheet for information about LAGEVRIO. Talk to your healthcare provider about your options if you have any questions. It is your choice to take LAGEVRIO.  What is COVID-19? COVID-19 is caused by a virus called a coronavirus. You can get COVID-19 through close contact with another person who has the virus. COVID-19 illnesses have ranged from very mild-to-severe, including illness resulting in death. While information so far suggests that most COVID-19 illness is mild, serious illness can happen and may cause some of your other  medical conditions to become worse. Older people and people of all ages with severe, long lasting (chronic) medical conditions like heart disease, lung disease and diabetes, for example seem to be at higher risk of being hospitalized for COVID-19.  What is LAGEVRIO? LAGEVRIO is an investigational medicine used to treat mild-to-moderate COVID-19 in adults: ? with positive results of direct SARS-CoV-2 viral testing, and ? who are at high risk for progression to severe COVID-19 including hospitalization or death, and for whom other COVID-19 treatment options approved  or authorized by the FDA are not accessible or clinically appropriate. The FDA has authorized the emergency use of LAGEVRIO for the treatment of mild-tomoderate COVID-19 in adults under an EUA. For more information on EUA, see the "What is an Emergency Use Authorization (EUA)?" section at the end of this Fact Sheet. LAGEVRIO is not authorized: ? for use in people less than 63 years of age. ? for prevention of COVID-19. ? for people needing hospitalization for COVID-19. ? for use for longer than 5 consecutive days.  What should I tell my healthcare provider before I take LAGEVRIO? Tell your healthcare provider if you: ? Have any allergies ? Are breastfeeding or plan to breastfeed ? Have any serious illnesses ? Are taking any medicines (prescription, over-the-counter, vitamins, or herbal products).  How do I take LAGEVRIO? ? Take LAGEVRIO exactly as your healthcare provider tells you to take it. ? Take 4 capsules of LAGEVRIO every 12 hours (for example, at 8 am and at 8 pm) ? Take LAGEVRIO for 5 days. It is important that you complete the full 5 days of treatment with LAGEVRIO. Do not stop taking LAGEVRIO before you complete the full 5 days of treatment, even if you feel better. ? Take LAGEVRIO with or without food. ? You should stay in isolation for as long as your healthcare provider tells you to. Talk to your healthcare provider if you are not sure about how to properly isolate while you have COVID-19. ? Swallow LAGEVRIO capsules whole. Do not open, break, or crush the capsules. If you cannot swallow capsules whole, tell your healthcare provider. ? What to do if you miss a dose: o If it has been less than 10 hours since the missed dose, take it as soon as you remember o If it has been more than 10 hours since the missed dose, skip the missed dose and take your dose at the next scheduled time. ? Do not double the dose of LAGEVRIO to make up for a missed dose.  What are the  important possible side effects of LAGEVRIO? ? See, "What is the most important information I should know about LAGEVRIO?" ? Allergic Reactions. Allergic reactions can happen in people taking LAGEVRIO, even after only 1 dose. Stop taking LAGEVRIO and call your healthcare provider right away if you get any of the following symptoms of an allergic reaction: o hives o rapid heartbeat o trouble swallowing or breathing o swelling of the mouth, lips, or face o throat tightness o hoarseness o skin rash The most common side effects of LAGEVRIO are: ? diarrhea ? nausea ? dizziness These are not all the possible side effects of LAGEVRIO. Not many people have taken LAGEVRIO. Serious and unexpected side effects may happen. This medicine is still being studied, so it is possible that all of the risks are not known at this time.  What other treatment choices are there?  Veklury (remdesivir) is FDA-approved as an intravenous (IV) infusion for the treatment  of mildto-moderate [id number] in certain adults and children. Talk with your doctor to see if Marijean Heath is appropriate for you. Like LAGEVRIO, FDA may also allow for the emergency use of other medicines to treat people with COVID-19. Go to [email or web address] for more information. It is your choice to be treated or not to be treated with LAGEVRIO. Should you decide not to take it, it will not change your standard medical care.  What if I am breastfeeding? Breastfeeding is not recommended during treatment with LAGEVRIO and for 4 days after the last dose of LAGEVRIO. If you are breastfeeding or plan to breastfeed, talk to your healthcare provider about your options and specific situation before taking LAGEVRIO.  How do I report side effects with LAGEVRIO? Contact your healthcare provider if you have any side effects that bother you or do not go  away. Report side effects to FDA MedWatch at SmoothHits.hu or call 1-800-FDA-1088 (1- 262-766-0982).  How should I store [place]? ? Store LAGEVRIO capsules at room temperature between 41F to 4F (20C to 25C). ? Keep LAGEVRIO and all medicines out of the reach of children and pets. How can I learn more about COVID-19? ? Ask your healthcare provider. ? Visit SeekRooms.co.uk ? Contact your local or state public health department. ? Call [place] at [phone] (toll free in the [place] ? Visit [email or web address].com  What Is an Emergency Use Authorization (EUA)? The [place] FDA has made [place] available under an emergency access mechanism called an Emergency Use Authorization (EUA) The EUA is supported by a Present[place], broadcasting Health and Human Service (HHS) declaration that circumstances exist to justify emergency use of drugs and biological products during the COVID-19 pandemic. LAGEVRIO for the treatment of mild-to-moderate COVID-19 in adults with positive results of direct SARS-CoV-2 viral testing, who are at high risk for progression to severe COVID-19, including hospitalization or death, and for whom alternative COVID-19 treatment options approved or authorized by FDA are not accessible or clinically appropriate, has not undergone the same type of review as an FDA-approved product. In issuing an EUA under the MAYOK-59 public health emergency, the FDA has determined, among other things, that based on the total amount of scientific evidence available including data from adequate and well-controlled clinical trials, if available, it is reasonable to believe that the product may be effective for diagnosing, treating, or preventing COVID-19, or a serious or life-threatening disease or condition caused by COVID-19; that the known and potential benefits of the product, when used to diagnose, treat, or prevent such disease or condition, outweigh the known and  potential risks of such product; and that there are no adequate, approved, and available alternatives.  All of these criteria must be met to allow for the product to be used in the treatment of patients during the COVID-19 pandemic. The EUA for LAGEVRIO is in effect for the duration of the COVID-19 declaration justifying emergency use of LAGEVRIO, unless terminated or revoked (after which LAGEVRIO may no longer be used under the EUA). For patent information: http://rogers.[email or web address] Copyright  2021-2022 [employer] [place] and its affiliates. All rights reserved. [id number] Revised: March 2022

## 2021-04-09 NOTE — Progress Notes (Addendum)
Virtual Visit via Video Note  I connected with Shannon [name]  on 04/09/21 at 10:40 AM EST by a video enabled telemedicine application and verified that I am speaking with the correct person using two identifiers.  Location patient: home, [place] Location provider:work or home office Persons participating in the virtual visit: patient, provider  I discussed the limitations of evaluation and management by telemedicine and the availability of in person appointments. The patient expressed understanding and agreed to proceed.   HPI:  Acute telemedicine visit for Covid19: -Onset: 2 days ago, tested positive for covid -daughter has covid too and she was with her -Symptoms include: fatigue, cough, nasal congestion, chill, diarrhea, taste is off -Denies:CP, SOB, vomiting, inability to drink fluid/to get out of bed -Pertinent past medical history:see below -GFR is 82 -Pertinent medication allergies: Allergies  Allergen Reactions   Hydrocodone Other (See Comments)    Sweating, lethargy, dizzy   Lipitor [Atorvastatin Calcium]     Myalgias, joint pain     Milk-Related Compounds     GI upset   Oxycodone Other (See Comments)    Sweating, lethargy   Wellbutrin [Bupropion]     "Out of place"   Adhesive [Tape] Rash    Band aids  -COVID-19 vaccine status:  Immunization History  Administered Date(s) Administered   Influenza Split 03/11/2011, 03/26/2012   Influenza, High Dose Seasonal PF 02/26/2018, 02/26/2018   Influenza,inj,Quad PF,6+ Mos 02/14/2013, 03/11/2017, 05/16/2019   Influenza-Unspecified 02/09/2013   Moderna Sars-Covid-2 Vaccination 07/08/2019   PPD Test 06/18/2009   Pneumococcal Conjugate-13 04/24/2014   Pneumococcal Polysaccharide-23 11/27/2020   Td 05/16/2003   Tdap 11/04/2010     ROS: See pertinent positives and negatives per HPI.  Past Medical History:  Diagnosis Date   Asthma    Autoimmune hepatitis ([place])    Depression    Diabetes mellitus without complication ([place])     DUB (dysfunctional uterine bleeding)    2008 s/p hysterectomy   Exposure to TB 2005   tx'ed x 6 months   Gallstones    Gastric ulcer    GERD (gastroesophageal reflux disease)    History of chicken pox    Hx of migraines    Liver disease    very low liver function, auto-immune hepatitis   Motion sickness    Osteoarthritis    Systemic lupus erythematosus ([place]) 05/31/2010   Qualifier: Diagnosis of  By: Deborra Medina MD, Talia     Vitamin D deficiency     Past Surgical History:  Procedure Laterality Date   ABDOMINAL HYSTERECTOMY     BACK SURGERY     L4/5 herniated disc repair 1992    HALLUX VALGUS AUSTIN Left 02/09/2019   Procedure: HALLUX VALGUS AUSTIN ([place]);  Surgeon: [name], DPM;  Location: [facility];  Service: Podiatry;  Laterality: Left;  General with local   HAMMER TOE SURGERY Left 02/09/2019   Procedure: HAMMER TOE CORRECTION;  Surgeon: [name], DPM;  Location: [facility];  Service: Podiatry;  Laterality: Left;  Diabetic - oral meds   LAPAROSCOPIC HYSTERECTOMY     TUBAL LIGATION       Current Outpatient Medications:    benzonatate (TESSALON PERLES) 100 MG capsule, 1-2 capsules up to twice daily for cough, Disp: 40 capsule, Rfl: 0   molnupiravir EUA (LAGEVRIO) 200 mg CAPS capsule, Take 4 capsules (800 mg total) by mouth 2 (two) times daily for 5 days., Disp: 40 capsule, Rfl: 0   albuterol (PROAIR HFA) 108 (90 Base) MCG/ACT inhaler, Inhale 1-2  puffs into the lungs every 6 (six) hours as needed for wheezing or shortness of breath., Disp: 54 g, Rfl: 3   azaTHIOprine (IMURAN) 50 MG tablet, Take 100 mg by mouth 2 (two) times daily. , Disp: , Rfl:    blood glucose meter kit and supplies KIT, Dispense based on patient and insurance preference. Use up to four times daily as directed. (FOR ICD-9 250.00, 250.01)., Disp: 1 each, Rfl: 0   blood glucose meter kit and supplies, Dispense based on patient and insurance preference. Use up to four times daily as  directed. (FOR ICD-10 E10.9, E11.9)., Disp: 1 each, Rfl: 0   Blood Glucose Monitoring Suppl (ONE TOUCH ULTRA SYSTEM KIT) W/DEVICE KIT, Test blood sugar as directed Dx E09.9, Disp: 1 each, Rfl: 0   calcium-vitamin D (OSCAL WITH D) 500-200 MG-UNIT per tablet, Take 1 tablet by mouth., Disp: , Rfl:    Cholecalciferol (VITAMIN D3) 5000 units TABS, Take by mouth. , Disp: , Rfl:    doxycycline (VIBRAMYCIN) 100 MG capsule, Take 1 capsule (100 mg total) by mouth 2 (two) times daily. (Patient not taking: Reported on 03/19/2021), Disp: 20 capsule, Rfl: 0   escitalopram (LEXAPRO) 10 MG tablet, Take 1 tablet (10 mg total) by mouth daily., Disp: 90 tablet, Rfl: 3   fluconazole (DIFLUCAN) 150 MG tablet, Take 1 tablet (150 mg total) by mouth daily. X 1 weeks, Disp: 7 tablet, Rfl: 0   glucose blood (ONETOUCH VERIO) test strip, 1 each by Other route 4 (four) times daily. Use as instructed, Disp: 400 strip, Rfl: 3   hydroxychloroquine (PLAQUENIL) 200 MG tablet, Take 200 mg by mouth 2 (two) times daily., Disp: , Rfl:    insulin degludec (TRESIBA FLEXTOUCH) 100 UNIT/ML FlexTouch Pen, Inject 10 Units into the skin daily., Disp: 15 mL, Rfl: 11   Insulin Pen Needle (PEN NEEDLES) 31G X 6 MM MISC, Use with [place] as instructed daily, Disp: 100 each, Rfl: 11   lactulose (CHRONULAC) 10 GM/15ML solution, TAKE 30 ML BY MOUTH 4 TIMES DAILY AS NEEDED (Patient taking differently: Take 30 g by mouth. 50-60 g 1x per day not 30 g qid), Disp: 10800 mL, Rfl: 3   lidocaine (LIDODERM) 5 %, Place 1 patch onto the skin daily. Remove & Discard patch within 12 hours or as directed by MD, Disp: 30 patch, Rfl: 5   loratadine (CLARITIN) 10 MG tablet, Take 10 mg by mouth every other day as needed for allergies., Disp: , Rfl:    Magnesium Oxide 500 MG TABS, Take by mouth., Disp: , Rfl:    metFORMIN (GLUCOPHAGE-XR) 500 MG 24 hr tablet, TAKE 2 TABLETS BY MOUTH  TWICE DAILY WITH A MEAL., Disp: 360 tablet, Rfl: 3   nystatin (MYCOSTATIN) 100000 UNIT/ML  suspension, Take 5 mLs (500,000 Units total) by mouth 4 (four) times daily. Hold and spit, Disp: 473 mL, Rfl: 0   omeprazole (PRILOSEC) 40 MG capsule, Take 1 capsule (40 mg total) by mouth daily. 30 min. Before a meal, Disp: 90 capsule, Rfl: 1   predniSONE (DELTASONE) 1 MG tablet, Take 3 mg by mouth daily., Disp: , Rfl:    vitamin B-12 (CYANOCOBALAMIN) 100 MCG tablet, Take 100 mcg by mouth daily., Disp: , Rfl:   EXAM:  VITALS per patient if applicable:  GENERAL: alert, oriented, appears well and in no acute distress  HEENT: atraumatic, conjunttiva clear, no obvious abnormalities on inspection of external nose and ears  NECK: normal movements of the head and neck  LUNGS: on  inspection no signs of respiratory distress, breathing rate appears normal, no obvious gross SOB, gasping or wheezing  CV: no obvious cyanosis  MS: moves all visible extremities without noticeable abnormality  PSYCH/NEURO: pleasant and cooperative, no obvious depression or anxiety, speech and thought processing grossly intact  ASSESSMENT AND PLAN:  Discussed the following assessment and plan:  COVID-19   Discussed treatment options and risk of drug interactions, ideal treatment window, potential complications, isolation and precautions for COVID-19.  Discussed possibility of rebound with antivirals and the need to reisolate if it should occur for 5 days. Checked for/reviewed any labs done in the last 90 days with GFR listed in HPI if available. After lengthy discussion, the patient opted for treatment with Legevrio due to being higher risk for complications of covid or severe disease and other factors. She is concerned about Paxlovid side effects. Discussed EUA status of this drug and the fact that there is preliminary limited knowledge of risks/interactions/side effects per EUA document vs possible benefits and precautions. This information was shared with patient during the visit and also was provided in patient  instructions. Also, advised that patient discuss risks/interactions and use with pharmacist/treatment team as well. The patient did want a prescription for cough, Tessalon Rx sent.  Other symptomatic care measures summarized in patient instructions. Advised to seek prompt in person care if worsening, new symptoms arise, or if is not improving with treatment. Discussed options for inperson care if PCP office not available. Did let this patient know that I only do telemedicine on Tuesdays and Thursdays for [place]. Advised to schedule follow up visit with PCP or [facility] if any further questions or concerns to avoid delays in care.   I discussed the assessment and treatment plan with the patient. The patient was provided an opportunity to ask questions and all were answered. The patient agreed with the plan and demonstrated an understanding of the instructions.     Lucretia Kern, DO

## 2021-04-24 ENCOUNTER — Other Ambulatory Visit: Payer: Self-pay | Admitting: Internal Medicine

## 2021-04-24 DIAGNOSIS — E119 Type 2 diabetes mellitus without complications: Secondary | ICD-10-CM

## 2021-04-24 DIAGNOSIS — F419 Anxiety disorder, unspecified: Secondary | ICD-10-CM

## 2021-04-24 MED ORDER — TRESIBA FLEXTOUCH 100 UNIT/ML ~~LOC~~ SOPN: 10.0000 [IU] | PEN_INJECTOR | Freq: Every day | SUBCUTANEOUS | 11 refills | Status: AC

## 2021-04-24 MED ORDER — ESCITALOPRAM OXALATE 10 MG PO TABS: 10.0000 mg | ORAL_TABLET | Freq: Every day | ORAL | 3 refills | Status: DC

## 2021-07-22 DIAGNOSIS — M25511 Pain in right shoulder: Secondary | ICD-10-CM | POA: Diagnosis not present

## 2021-07-22 DIAGNOSIS — G8929 Other chronic pain: Secondary | ICD-10-CM | POA: Diagnosis not present

## 2021-07-22 DIAGNOSIS — M25512 Pain in left shoulder: Secondary | ICD-10-CM | POA: Diagnosis not present

## 2021-07-22 DIAGNOSIS — M461 Sacroiliitis, not elsewhere classified: Secondary | ICD-10-CM | POA: Diagnosis not present

## 2021-08-01 DIAGNOSIS — M461 Sacroiliitis, not elsewhere classified: Secondary | ICD-10-CM | POA: Diagnosis not present

## 2021-08-09 DIAGNOSIS — K766 Portal hypertension: Secondary | ICD-10-CM | POA: Diagnosis not present

## 2021-08-09 DIAGNOSIS — Z79899 Other long term (current) drug therapy: Secondary | ICD-10-CM | POA: Diagnosis not present

## 2021-08-09 DIAGNOSIS — Z7984 Long term (current) use of oral hypoglycemic drugs: Secondary | ICD-10-CM | POA: Diagnosis not present

## 2021-08-09 DIAGNOSIS — Z8619 Personal history of other infectious and parasitic diseases: Secondary | ICD-10-CM | POA: Diagnosis not present

## 2021-08-09 DIAGNOSIS — K7460 Unspecified cirrhosis of liver: Secondary | ICD-10-CM | POA: Diagnosis not present

## 2021-08-09 DIAGNOSIS — R1084 Generalized abdominal pain: Secondary | ICD-10-CM | POA: Diagnosis not present

## 2021-08-09 DIAGNOSIS — I8510 Secondary esophageal varices without bleeding: Secondary | ICD-10-CM | POA: Diagnosis not present

## 2021-08-09 DIAGNOSIS — K319 Disease of stomach and duodenum, unspecified: Secondary | ICD-10-CM | POA: Diagnosis not present

## 2021-08-09 DIAGNOSIS — K3189 Other diseases of stomach and duodenum: Secondary | ICD-10-CM | POA: Diagnosis not present

## 2021-08-09 DIAGNOSIS — E119 Type 2 diabetes mellitus without complications: Secondary | ICD-10-CM | POA: Diagnosis not present

## 2021-08-09 DIAGNOSIS — K219 Gastro-esophageal reflux disease without esophagitis: Secondary | ICD-10-CM | POA: Diagnosis not present

## 2021-08-27 DIAGNOSIS — M7551 Bursitis of right shoulder: Secondary | ICD-10-CM | POA: Diagnosis not present

## 2021-08-27 DIAGNOSIS — M7552 Bursitis of left shoulder: Secondary | ICD-10-CM | POA: Diagnosis not present

## 2021-08-27 DIAGNOSIS — M25511 Pain in right shoulder: Secondary | ICD-10-CM | POA: Diagnosis not present

## 2021-08-27 DIAGNOSIS — M25512 Pain in left shoulder: Secondary | ICD-10-CM | POA: Diagnosis not present

## 2021-08-27 DIAGNOSIS — G8929 Other chronic pain: Secondary | ICD-10-CM | POA: Diagnosis not present

## 2021-09-02 DIAGNOSIS — K766 Portal hypertension: Secondary | ICD-10-CM | POA: Diagnosis not present

## 2021-09-02 DIAGNOSIS — K7682 Hepatic encephalopathy: Secondary | ICD-10-CM | POA: Diagnosis not present

## 2021-09-02 DIAGNOSIS — K754 Autoimmune hepatitis: Secondary | ICD-10-CM | POA: Diagnosis not present

## 2021-09-02 DIAGNOSIS — K7460 Unspecified cirrhosis of liver: Secondary | ICD-10-CM | POA: Diagnosis not present

## 2021-09-02 DIAGNOSIS — I8510 Secondary esophageal varices without bleeding: Secondary | ICD-10-CM | POA: Diagnosis not present

## 2021-09-03 DIAGNOSIS — K766 Portal hypertension: Secondary | ICD-10-CM | POA: Insufficient documentation

## 2021-09-04 ENCOUNTER — Ambulatory Visit (INDEPENDENT_AMBULATORY_CARE_PROVIDER_SITE_OTHER): Payer: Medicare Other

## 2021-09-04 VITALS — Ht 66.0 in | Wt 178.0 lb

## 2021-09-04 DIAGNOSIS — Z0000 Encounter for general adult medical examination without abnormal findings: Secondary | ICD-10-CM

## 2021-09-04 NOTE — Patient Instructions (Addendum)
?  Ms. Posa , ?Thank you for taking time to come for your Medicare Wellness Visit. I appreciate your ongoing commitment to your health goals. Please review the following plan we discussed and let me know if I can assist you in the future.  ? ?These are the goals we discussed: ? Goals   ? ?  ? Patient Stated  ?   Client plans to start chantix and stop smoking (pt-stated)   ?   Patient states she smokes less ?  ? ?  ?  ?This is a list of the screening recommended for you and due dates:  ?Health Maintenance  ?Topic Date Due  ? HIV Screening  Never done  ? Urine Protein Check  04/27/2020  ? Complete foot exam   05/01/2020  ? Eye exam for diabetics  10/24/2020  ? COVID-19 Vaccine (2 - Moderna risk series) 09/20/2021*  ? Tetanus Vaccine  09/05/2022*  ? Hemoglobin A1C  09/17/2021  ? Flu Shot  12/10/2021  ? Pap Smear  03/24/2022  ? Colon Cancer Screening  07/08/2026  ? Hepatitis C Screening: USPSTF Recommendation to screen - Ages 95-79 yo.  Completed  ? HPV Vaccine  Aged Out  ? Mammogram  Discontinued  ? Zoster (Shingles) Vaccine  Discontinued  ?*Topic was postponed. The date shown is not the original due date.  ?  ?

## 2021-09-04 NOTE — Progress Notes (Signed)
Subjective:   Shannon [name] is a 66 y.o. female who presents for Medicare Annual (Subsequent) preventive examination.  Review of Systems    No ROS.  Medicare Wellness Virtual Visit.  Visual/audio telehealth visit, UTA vital signs.   See social history for additional risk factors.   Cardiac Risk Factors include: advanced age (>76men, >29 women);diabetes mellitus     Objective:    Today's Vitals   09/04/21 0904  Weight: 178 lb (80.7 kg)  Height: 5\' 6"  (1.676 m)   Body mass index is 28.73 kg/m.     09/04/2021    9:27 AM 09/03/2020   10:05 AM 09/01/2019   10:21 AM 05/23/2019    9:04 AM 02/09/2019   11:22 AM 08/31/2018   10:29 AM 07/18/2017    9:58 AM  Advanced Directives  Does Patient Have a Medical Advance Directive? No Yes No No No No No  Type of Advance Directive  Healthcare Power of Attorney       Does patient want to make changes to medical advance directive?  No - Patient declined       Copy of Healthcare Power of Attorney in Chart?  No - copy requested       Would patient like information on creating a medical advance directive? No - Patient declined  No - Patient declined No - Patient declined No - Patient declined No - Patient declined     Current Medications (verified) Outpatient Encounter Medications as of 09/04/2021  Medication Sig   albuterol (PROAIR HFA) 108 (90 Base) MCG/ACT inhaler Inhale 1-2 puffs into the lungs every 6 (six) hours as needed for wheezing or shortness of breath.   azaTHIOprine (IMURAN) 50 MG tablet Take 100 mg by mouth 2 (two) times daily.    benzonatate (TESSALON PERLES) 100 MG capsule 1-2 capsules up to twice daily for cough   blood glucose meter kit and supplies KIT Dispense based on patient and insurance preference. Use up to four times daily as directed. (FOR ICD-9 250.00, 250.01).   blood glucose meter kit and supplies Dispense based on patient and insurance preference. Use up to four times daily as directed. (FOR ICD-10 E10.9, E11.9).    Blood Glucose Monitoring Suppl (ONE TOUCH ULTRA SYSTEM KIT) W/DEVICE KIT Test blood sugar as directed Dx E09.9   calcium-vitamin D (OSCAL WITH D) 500-200 MG-UNIT per tablet Take 1 tablet by mouth.   Cholecalciferol (VITAMIN D3) 5000 units TABS Take by mouth.    doxycycline (VIBRAMYCIN) 100 MG capsule Take 1 capsule (100 mg total) by mouth 2 (two) times daily. (Patient not taking: Reported on 03/19/2021)   escitalopram (LEXAPRO) 10 MG tablet Take 1 tablet (10 mg total) by mouth daily.   fluconazole (DIFLUCAN) 150 MG tablet Take 1 tablet (150 mg total) by mouth daily. X 1 weeks   glucose blood (ONETOUCH VERIO) test strip 1 each by Other route 4 (four) times daily. Use as instructed   hydroxychloroquine (PLAQUENIL) 200 MG tablet Take 200 mg by mouth 2 (two) times daily.   insulin degludec (TRESIBA FLEXTOUCH) 100 UNIT/ML FlexTouch Pen Inject 10 Units into the skin daily.   Insulin Pen Needle (PEN NEEDLES) 31G X 6 MM MISC Use with Evaristo Bury as instructed daily   lactulose (CHRONULAC) 10 GM/15ML solution TAKE 30 ML BY MOUTH 4 TIMES DAILY AS NEEDED (Patient taking differently: Take 30 g by mouth. 50-60 g 1x per day not 30 g qid)   lidocaine (LIDODERM) 5 % Place 1 patch onto the  skin daily. Remove & Discard patch within 12 hours or as directed by MD   loratadine (CLARITIN) 10 MG tablet Take 10 mg by mouth every other day as needed for allergies.   Magnesium Oxide 500 MG TABS Take by mouth.   metFORMIN (GLUCOPHAGE-XR) 500 MG 24 hr tablet TAKE 2 TABLETS BY MOUTH  TWICE DAILY WITH A MEAL.   nystatin (MYCOSTATIN) 100000 UNIT/ML suspension Take 5 mLs (500,000 Units total) by mouth 4 (four) times daily. Hold and spit   omeprazole (PRILOSEC) 40 MG capsule Take 1 capsule (40 mg total) by mouth daily. 30 min. Before a meal   predniSONE (DELTASONE) 1 MG tablet Take 3 mg by mouth daily.   vitamin B-12 (CYANOCOBALAMIN) 100 MCG tablet Take 100 mcg by mouth daily.   No facility-administered encounter medications on file  as of 09/04/2021.    Allergies (verified) Hydrocodone, Lipitor [atorvastatin calcium], Milk-related compounds, Oxycodone, Wellbutrin [bupropion], and Adhesive [tape]   History: Past Medical History:  Diagnosis Date   Asthma    Autoimmune hepatitis (HCC)    Depression    Diabetes mellitus without complication (HCC)    DUB (dysfunctional uterine bleeding)    2008 s/p hysterectomy   Exposure to TB 2005   tx'ed x 6 months   Gallstones    Gastric ulcer    GERD (gastroesophageal reflux disease)    History of chicken pox    Hx of migraines    Liver disease    very low liver function, auto-immune hepatitis   Motion sickness    Osteoarthritis    Systemic lupus erythematosus (HCC) 05/31/2010   Qualifier: Diagnosis of  By: Dayton Martes MD, Talia     Vitamin D deficiency    Past Surgical History:  Procedure Laterality Date   ABDOMINAL HYSTERECTOMY     BACK SURGERY     L4/5 herniated disc repair 1992    HALLUX VALGUS AUSTIN Left 02/09/2019   Procedure: HALLUX VALGUS AUSTIN (MITCHELL);  Surgeon: Gwyneth Revels, DPM;  Location: [facility];  Service: Podiatry;  Laterality: Left;  General with local   HAMMER TOE SURGERY Left 02/09/2019   Procedure: HAMMER TOE CORRECTION;  Surgeon: Gwyneth Revels, DPM;  Location: [facility];  Service: Podiatry;  Laterality: Left;  Diabetic - oral meds   LAPAROSCOPIC HYSTERECTOMY     TUBAL LIGATION     Family History  Problem Relation Age of Onset   Cancer Mother        lung cancer smoker   Heart disease Father    Hypertension Father    Hypertension Sister    Sarcoidosis Sister        lung transplant failure   Heart disease Brother    Alcohol abuse Brother    Diabetes Sister    Diabetes Sister    Kidney disease Sister    Hypertension Sister    Rheum arthritis Sister    Diabetes Son    Hypertension Son    Social History   Socioeconomic History   Marital status: Legally Separated    Spouse name: Not on file   Number of children: Not  on file   Years of education: Not on file   Highest education level: Not on file  Occupational History   Not on file  Tobacco Use   Smoking status: Every Day    Packs/day: 0.50    Years: 30.00    Pack years: 15.00    Types: Cigarettes   Smokeless tobacco: Never  Vaping Use  Vaping Use: Never used  Substance and Sexual Activity   Alcohol use: No   Drug use: No   Sexual activity: Not Currently  Other Topics Concern   Not on file  Social History Narrative   4 pregnancies, 3 kids    Smoker    Separated    College ed former LPN   Social Determinants of Health   Financial Resource Strain: Low Risk    Difficulty of Paying Living Expenses: Not hard at all  Food Insecurity: No Food Insecurity   Worried About Programme researcher, broadcasting/film/video in the Last Year: Never true   Barista in the Last Year: Never true  Transportation Needs: No Transportation Needs   Lack of Transportation (Medical): No   Lack of Transportation (Non-Medical): No  Physical Activity: Not on file  Stress: No Stress Concern Present   Feeling of Stress : Not at all  Social Connections: Unknown   Frequency of Communication with Friends and Family: More than three times a week   Frequency of Social Gatherings with Friends and Family: More than three times a week   Attends Religious Services: Not on Scientist, clinical (histocompatibility and immunogenetics) or Organizations: Not on file   Attends Banker Meetings: Not on file   Marital Status: Not on file    Tobacco Counseling Ready to quit: Not Answered Counseling given: Not Answered   Clinical Intake:  Pre-visit preparation completed: Yes        Diabetes: Yes (Followed by PCP)  How often do you need to have someone help you when you read instructions, pamphlets, or other written materials from your doctor or pharmacy?: 1 - Never   Interpreter Needed?: No      Activities of Daily Living    09/04/2021    9:07 AM  In your present state of health, do you have  any difficulty performing the following activities:  Hearing? 0  Vision? 0  Comment Sets reminders to help take medications. Manages her finances without issues.  Walking or climbing stairs? 0  Dressing or bathing? 0  Doing errands, shopping? 0  Preparing Food and eating ? N  Using the Toilet? N  In the past six months, have you accidently leaked urine? N  Do you have problems with loss of bowel control? N  Managing your Medications? N  Comment Pill box in use box in use  Managing your Finances? N  Housekeeping or managing your Housekeeping? N   Patient Care Team: McLean-Scocuzza, Pasty Spillers, MD as PCP - General (Internal Medicine)  Indicate any recent Medical Services you may have received from other than [facility] providers in the past year (date may be approximate).     Assessment:   This is a routine wellness examination for Sky.  Virtual Visit via Telephone Note  I connected with  Glennon Mac on 09/04/21 at  9:00 AM EDT by telephone and verified that I am speaking with the correct person using two identifiers.  Persons participating in the virtual visit: patient/Nurse Health Advisor   I discussed the limitations of performing an evaluation and management service by telehealth. The patient expressed understanding and agreed to proceed. We continued and completed visit with audio only. Some vital signs may be absent or patient reported.   Hearing/Vision screen Hearing Screening - Comments:: Patient is able to hear conversational tones without difficulty. No issues reported.  Vision Screening - Comments:: Followed by [facility] She has seen her  ophthalmologist in the last 12 months  Dietary issues and exercise activities discussed: Current Exercise Habits: Home exercise routine, Type of exercise: stretching, Time (Minutes): 20, Frequency (Times/Week): 4, Weekly Exercise (Minutes/Week): 80, Intensity: Mild Regular diet Good water intake   Goals Addressed                This Visit's Progress     Patient Stated     Client plans to start chantix and stop smoking (pt-stated)   On track     Patient states she smokes less       Depression Screen    09/04/2021    9:08 AM 03/19/2021   11:10 AM 09/03/2020   10:02 AM 09/01/2019   10:20 AM 03/25/2019    9:04 AM 10/05/2018    9:37 AM 08/31/2018   10:12 AM  PHQ 2/9 Scores  PHQ - 2 Score 0 5 0 0 0 0 0    Fall Risk    09/04/2021    9:08 AM 09/13/2020    1:35 PM 09/03/2020   10:06 AM 08/10/2020    9:40 AM 04/11/2020    1:37 PM  Fall Risk   Falls in the past year? 0 0 0 0 0  Number falls in past yr: 0 0  0 0  Injury with Fall?  0  0 0  Follow up Falls evaluation completed Falls evaluation completed Falls evaluation completed Falls evaluation completed Falls evaluation completed    FALL RISK PREVENTION PERTAINING TO THE HOME: Home free of loose throw rugs in walkways, pet beds, electrical cords, etc? Yes  Adequate lighting in your home to reduce risk of falls? Yes   ASSISTIVE DEVICES UTILIZED TO PREVENT FALLS: Life alert? No  Use of a cane, walker or w/c? No   TIMED UP AND GO: Was the test performed? No .   Cognitive Function:  Patient is alert and oriented x3.       09/01/2019   10:23 AM 08/31/2018   10:18 AM  6CIT Screen  What Year? 0 points 0 points  What month? 0 points 0 points  What time? 0 points 0 points  Count back from 20  0 points  Months in reverse 0 points 0 points  Repeat phrase 0 points 0 points  Total Score  0 points    Immunizations Immunization History  Administered Date(s) Administered   Influenza Split 03/11/2011, 03/26/2012   Influenza, High Dose Seasonal PF 02/26/2018, 02/26/2018   Influenza,inj,Quad PF,6+ Mos 02/14/2013, 03/11/2017, 05/16/2019   Influenza-Unspecified 02/09/2013   Moderna Sars-Covid-2 Vaccination 07/08/2019   PPD Test 06/18/2009   Pneumococcal Conjugate-13 04/24/2014   Pneumococcal Polysaccharide-23 11/27/2020   Td 05/16/2003   Tdap  11/04/2010    TDAP status: Due, Education has been provided regarding the importance of this vaccine. Advised may receive this vaccine at local pharmacy or Health Dept. Aware to provide a copy of the vaccination record if obtained from local pharmacy or Health Dept. Verbalized acceptance and understanding.Deferred.   Shingrix vaccine- discontinued per patient.   Screening Tests Health Maintenance  Topic Date Due   HIV Screening  Never done   URINE MICROALBUMIN  04/27/2020   FOOT EXAM  05/01/2020   OPHTHALMOLOGY EXAM  10/24/2020   COVID-19 Vaccine (2 - Moderna risk series) 09/20/2021 (Originally 08/05/2019)   TETANUS/TDAP  09/05/2022 (Originally 11/03/2020)   HEMOGLOBIN A1C  09/17/2021   INFLUENZA VACCINE  12/10/2021   PAP SMEAR-Modifier  03/24/2022   COLONOSCOPY (Pts 45-49yrs Insurance coverage will need  to be confirmed)  07/08/2026   Hepatitis C Screening  Completed   HPV VACCINES  Aged Out   MAMMOGRAM  Discontinued   Zoster Vaccines- Shingrix  Discontinued   Health Maintenance Health Maintenance Due  Topic Date Due   HIV Screening  Never done   URINE MICROALBUMIN  04/27/2020   FOOT EXAM  05/01/2020   OPHTHALMOLOGY EXAM  10/24/2020   Vision Screening: Recommended annual ophthalmology exams for early detection of glaucoma and other disorders of the eye. Agrees to schedule annual exam.  Dental Screening: Recommended annual dental exams for proper oral hygiene  Community Resource Referral / Chronic Care Management: CRR required this visit?  No   CCM required this visit?  No      Plan:   Keep all routine maintenance appointments.   I have personally reviewed and noted the following in the patient's chart:   Medical and social history Use of alcohol, tobacco or illicit drugs  Current medications and supplements including opioid prescriptions.  Functional ability and status Nutritional status Physical activity Advanced directives List of other  physicians Hospitalizations, surgeries, and ER visits in previous 12 months Vitals Screenings to include cognitive, depression, and falls Referrals and appointments  In addition, I have reviewed and discussed with patient certain preventive protocols, quality metrics, and best practice recommendations. A written personalized care plan for preventive services as well as general preventive health recommendations were provided to patient.     Ashok Pall, LPN   1/61/0960

## 2021-09-06 DIAGNOSIS — K754 Autoimmune hepatitis: Secondary | ICD-10-CM | POA: Diagnosis not present

## 2021-09-06 DIAGNOSIS — K7460 Unspecified cirrhosis of liver: Secondary | ICD-10-CM | POA: Diagnosis not present

## 2021-09-19 DIAGNOSIS — R1013 Epigastric pain: Secondary | ICD-10-CM | POA: Diagnosis not present

## 2021-09-19 DIAGNOSIS — D490 Neoplasm of unspecified behavior of digestive system: Secondary | ICD-10-CM | POA: Diagnosis not present

## 2021-09-19 DIAGNOSIS — K862 Cyst of pancreas: Secondary | ICD-10-CM | POA: Diagnosis not present

## 2021-09-23 ENCOUNTER — Telehealth: Payer: Self-pay

## 2021-09-23 NOTE — Telephone Encounter (Signed)
Typically they put drops in eyes to dilate the eyes for diabetic eye exam  ?She can call the eye clinic back for further clarification of her questions  ? ? ?

## 2021-09-23 NOTE — Telephone Encounter (Signed)
Shannon [name] has been to [facility] before Dr. Edison Pace this is where Shannon [name] needs to call for appt for f/u diabetic eye exam last seen in 2021 ? ?

## 2021-09-23 NOTE — Telephone Encounter (Signed)
S/w pt to inform her to reach back out to eye clinic. Pt stated she received call from our office stating that we do diabetic eye exams here where we place pt's in dark room before exam. Informed pt that we don't do those hear and asked who she spoke to she could not remember name. Pt stated she will just cancel appt for 09/26/21 [facility] clinic [facility].  ?

## 2021-09-23 NOTE — Telephone Encounter (Signed)
Pt notified to call [place] to sch diabetic eye exam.  ?

## 2021-09-23 NOTE — Telephone Encounter (Signed)
Patient called to state that she has an appointment scheduled on 09/26/2021, for an eye clinic that [facility] will be having.  Patient states the person who called to register her for the appointment said they will not be putting drops in her eyes.  Patient would like to know if this appointment will be sufficient to count as her routine eye exam for diabetes. ?

## 2021-09-27 ENCOUNTER — Other Ambulatory Visit: Payer: Self-pay | Admitting: Internal Medicine

## 2021-10-01 ENCOUNTER — Other Ambulatory Visit: Payer: Self-pay | Admitting: Internal Medicine

## 2021-10-01 DIAGNOSIS — J4521 Mild intermittent asthma with (acute) exacerbation: Secondary | ICD-10-CM

## 2021-10-01 DIAGNOSIS — J40 Bronchitis, not specified as acute or chronic: Secondary | ICD-10-CM

## 2021-10-01 DIAGNOSIS — J4520 Mild intermittent asthma, uncomplicated: Secondary | ICD-10-CM

## 2021-10-24 DIAGNOSIS — D490 Neoplasm of unspecified behavior of digestive system: Secondary | ICD-10-CM | POA: Diagnosis not present

## 2021-10-24 DIAGNOSIS — R932 Abnormal findings on diagnostic imaging of liver and biliary tract: Secondary | ICD-10-CM | POA: Diagnosis not present

## 2021-10-24 DIAGNOSIS — K862 Cyst of pancreas: Secondary | ICD-10-CM | POA: Diagnosis not present

## 2021-10-30 DIAGNOSIS — R062 Wheezing: Secondary | ICD-10-CM | POA: Diagnosis not present

## 2021-10-30 DIAGNOSIS — M7989 Other specified soft tissue disorders: Secondary | ICD-10-CM | POA: Diagnosis not present

## 2021-10-30 DIAGNOSIS — M25572 Pain in left ankle and joints of left foot: Secondary | ICD-10-CM | POA: Diagnosis not present

## 2021-10-30 DIAGNOSIS — F17210 Nicotine dependence, cigarettes, uncomplicated: Secondary | ICD-10-CM | POA: Diagnosis not present

## 2021-10-30 DIAGNOSIS — E119 Type 2 diabetes mellitus without complications: Secondary | ICD-10-CM | POA: Diagnosis not present

## 2021-10-30 DIAGNOSIS — M25471 Effusion, right ankle: Secondary | ICD-10-CM | POA: Diagnosis not present

## 2021-10-30 DIAGNOSIS — I517 Cardiomegaly: Secondary | ICD-10-CM | POA: Diagnosis not present

## 2021-10-30 DIAGNOSIS — M25571 Pain in right ankle and joints of right foot: Secondary | ICD-10-CM | POA: Diagnosis not present

## 2021-10-30 DIAGNOSIS — R140 Abdominal distension (gaseous): Secondary | ICD-10-CM | POA: Diagnosis not present

## 2021-10-30 DIAGNOSIS — R809 Proteinuria, unspecified: Secondary | ICD-10-CM | POA: Diagnosis not present

## 2021-10-30 DIAGNOSIS — Z79899 Other long term (current) drug therapy: Secondary | ICD-10-CM | POA: Diagnosis not present

## 2021-10-30 DIAGNOSIS — M1990 Unspecified osteoarthritis, unspecified site: Secondary | ICD-10-CM | POA: Diagnosis not present

## 2021-10-30 DIAGNOSIS — R9431 Abnormal electrocardiogram [ECG] [EKG]: Secondary | ICD-10-CM | POA: Diagnosis not present

## 2021-10-30 DIAGNOSIS — R918 Other nonspecific abnormal finding of lung field: Secondary | ICD-10-CM | POA: Diagnosis not present

## 2021-10-30 DIAGNOSIS — R0602 Shortness of breath: Secondary | ICD-10-CM | POA: Diagnosis not present

## 2021-10-30 DIAGNOSIS — Z7984 Long term (current) use of oral hypoglycemic drugs: Secondary | ICD-10-CM | POA: Diagnosis not present

## 2021-10-30 DIAGNOSIS — K219 Gastro-esophageal reflux disease without esophagitis: Secondary | ICD-10-CM | POA: Diagnosis not present

## 2021-10-30 DIAGNOSIS — M25472 Effusion, left ankle: Secondary | ICD-10-CM | POA: Diagnosis not present

## 2021-10-30 DIAGNOSIS — R600 Localized edema: Secondary | ICD-10-CM | POA: Diagnosis not present

## 2021-10-31 DIAGNOSIS — R600 Localized edema: Secondary | ICD-10-CM | POA: Diagnosis not present

## 2021-11-05 DIAGNOSIS — M461 Sacroiliitis, not elsewhere classified: Secondary | ICD-10-CM | POA: Diagnosis not present

## 2021-11-05 DIAGNOSIS — I421 Obstructive hypertrophic cardiomyopathy: Secondary | ICD-10-CM | POA: Diagnosis not present

## 2021-11-05 DIAGNOSIS — R062 Wheezing: Secondary | ICD-10-CM | POA: Diagnosis not present

## 2021-11-05 DIAGNOSIS — Z66 Do not resuscitate: Secondary | ICD-10-CM | POA: Diagnosis not present

## 2021-11-05 DIAGNOSIS — D649 Anemia, unspecified: Secondary | ICD-10-CM | POA: Diagnosis not present

## 2021-11-05 DIAGNOSIS — M2550 Pain in unspecified joint: Secondary | ICD-10-CM | POA: Diagnosis not present

## 2021-11-05 DIAGNOSIS — K219 Gastro-esophageal reflux disease without esophagitis: Secondary | ICD-10-CM | POA: Diagnosis not present

## 2021-11-05 DIAGNOSIS — I420 Dilated cardiomyopathy: Secondary | ICD-10-CM | POA: Diagnosis not present

## 2021-11-05 DIAGNOSIS — R918 Other nonspecific abnormal finding of lung field: Secondary | ICD-10-CM | POA: Diagnosis not present

## 2021-11-05 DIAGNOSIS — I459 Conduction disorder, unspecified: Secondary | ICD-10-CM | POA: Diagnosis not present

## 2021-11-05 DIAGNOSIS — Z515 Encounter for palliative care: Secondary | ICD-10-CM | POA: Diagnosis not present

## 2021-11-05 DIAGNOSIS — I5021 Acute systolic (congestive) heart failure: Secondary | ICD-10-CM | POA: Diagnosis not present

## 2021-11-05 DIAGNOSIS — R0602 Shortness of breath: Secondary | ICD-10-CM | POA: Diagnosis not present

## 2021-11-05 DIAGNOSIS — I081 Rheumatic disorders of both mitral and tricuspid valves: Secondary | ICD-10-CM | POA: Diagnosis not present

## 2021-11-05 DIAGNOSIS — E8779 Other fluid overload: Secondary | ICD-10-CM | POA: Diagnosis not present

## 2021-11-05 DIAGNOSIS — R110 Nausea: Secondary | ICD-10-CM | POA: Diagnosis not present

## 2021-11-05 DIAGNOSIS — R0789 Other chest pain: Secondary | ICD-10-CM | POA: Diagnosis not present

## 2021-11-05 DIAGNOSIS — R109 Unspecified abdominal pain: Secondary | ICD-10-CM | POA: Diagnosis not present

## 2021-11-05 DIAGNOSIS — F17210 Nicotine dependence, cigarettes, uncomplicated: Secondary | ICD-10-CM | POA: Diagnosis not present

## 2021-11-05 DIAGNOSIS — I447 Left bundle-branch block, unspecified: Secondary | ICD-10-CM | POA: Diagnosis not present

## 2021-11-05 DIAGNOSIS — R079 Chest pain, unspecified: Secondary | ICD-10-CM | POA: Diagnosis not present

## 2021-11-05 DIAGNOSIS — M329 Systemic lupus erythematosus, unspecified: Secondary | ICD-10-CM | POA: Diagnosis not present

## 2021-11-05 DIAGNOSIS — K7460 Unspecified cirrhosis of liver: Secondary | ICD-10-CM | POA: Diagnosis not present

## 2021-11-05 DIAGNOSIS — Z20822 Contact with and (suspected) exposure to covid-19: Secondary | ICD-10-CM | POA: Diagnosis not present

## 2021-11-05 DIAGNOSIS — R5381 Other malaise: Secondary | ICD-10-CM | POA: Diagnosis not present

## 2021-11-05 DIAGNOSIS — I5020 Unspecified systolic (congestive) heart failure: Secondary | ICD-10-CM | POA: Diagnosis not present

## 2021-11-05 DIAGNOSIS — R600 Localized edema: Secondary | ICD-10-CM | POA: Diagnosis not present

## 2021-11-05 DIAGNOSIS — E8770 Fluid overload, unspecified: Secondary | ICD-10-CM | POA: Diagnosis not present

## 2021-11-05 DIAGNOSIS — D539 Nutritional anemia, unspecified: Secondary | ICD-10-CM | POA: Diagnosis not present

## 2021-11-05 DIAGNOSIS — I4891 Unspecified atrial fibrillation: Secondary | ICD-10-CM | POA: Diagnosis not present

## 2021-11-05 DIAGNOSIS — D849 Immunodeficiency, unspecified: Secondary | ICD-10-CM | POA: Diagnosis not present

## 2021-11-05 DIAGNOSIS — K754 Autoimmune hepatitis: Secondary | ICD-10-CM | POA: Diagnosis not present

## 2021-11-05 DIAGNOSIS — R5383 Other fatigue: Secondary | ICD-10-CM | POA: Diagnosis not present

## 2021-11-05 DIAGNOSIS — K766 Portal hypertension: Secondary | ICD-10-CM | POA: Diagnosis not present

## 2021-11-05 DIAGNOSIS — J45909 Unspecified asthma, uncomplicated: Secondary | ICD-10-CM | POA: Diagnosis not present

## 2021-11-05 DIAGNOSIS — E119 Type 2 diabetes mellitus without complications: Secondary | ICD-10-CM | POA: Diagnosis not present

## 2021-11-05 DIAGNOSIS — J811 Chronic pulmonary edema: Secondary | ICD-10-CM | POA: Diagnosis not present

## 2021-11-05 DIAGNOSIS — R531 Weakness: Secondary | ICD-10-CM | POA: Diagnosis not present

## 2021-11-05 DIAGNOSIS — R197 Diarrhea, unspecified: Secondary | ICD-10-CM | POA: Diagnosis not present

## 2021-11-05 DIAGNOSIS — I4892 Unspecified atrial flutter: Secondary | ICD-10-CM | POA: Diagnosis not present

## 2021-11-05 DIAGNOSIS — R000 Tachycardia, unspecified: Secondary | ICD-10-CM | POA: Diagnosis not present

## 2021-11-05 DIAGNOSIS — I509 Heart failure, unspecified: Secondary | ICD-10-CM | POA: Diagnosis not present

## 2021-11-06 DIAGNOSIS — D649 Anemia, unspecified: Secondary | ICD-10-CM | POA: Insufficient documentation

## 2021-11-06 DIAGNOSIS — J810 Acute pulmonary edema: Secondary | ICD-10-CM | POA: Insufficient documentation

## 2021-11-06 DIAGNOSIS — J90 Pleural effusion, not elsewhere classified: Secondary | ICD-10-CM | POA: Insufficient documentation

## 2021-11-06 DIAGNOSIS — E8770 Fluid overload, unspecified: Secondary | ICD-10-CM | POA: Diagnosis not present

## 2021-11-06 DIAGNOSIS — R109 Unspecified abdominal pain: Secondary | ICD-10-CM | POA: Insufficient documentation

## 2021-11-06 DIAGNOSIS — R7989 Other specified abnormal findings of blood chemistry: Secondary | ICD-10-CM | POA: Insufficient documentation

## 2021-11-06 DIAGNOSIS — R197 Diarrhea, unspecified: Secondary | ICD-10-CM | POA: Insufficient documentation

## 2021-11-06 DIAGNOSIS — R600 Localized edema: Secondary | ICD-10-CM | POA: Insufficient documentation

## 2021-11-07 DIAGNOSIS — R0602 Shortness of breath: Secondary | ICD-10-CM | POA: Diagnosis not present

## 2021-11-08 DIAGNOSIS — I4891 Unspecified atrial fibrillation: Secondary | ICD-10-CM | POA: Insufficient documentation

## 2021-11-08 DIAGNOSIS — R0602 Shortness of breath: Secondary | ICD-10-CM | POA: Diagnosis not present

## 2021-11-13 DIAGNOSIS — I5020 Unspecified systolic (congestive) heart failure: Secondary | ICD-10-CM | POA: Insufficient documentation

## 2021-11-14 ENCOUNTER — Telehealth: Payer: Self-pay

## 2021-11-14 DIAGNOSIS — I4891 Unspecified atrial fibrillation: Secondary | ICD-10-CM | POA: Diagnosis not present

## 2021-11-14 DIAGNOSIS — D539 Nutritional anemia, unspecified: Secondary | ICD-10-CM | POA: Diagnosis not present

## 2021-11-14 DIAGNOSIS — M1990 Unspecified osteoarthritis, unspecified site: Secondary | ICD-10-CM | POA: Diagnosis not present

## 2021-11-14 DIAGNOSIS — K7460 Unspecified cirrhosis of liver: Secondary | ICD-10-CM | POA: Diagnosis not present

## 2021-11-14 DIAGNOSIS — I420 Dilated cardiomyopathy: Secondary | ICD-10-CM | POA: Diagnosis not present

## 2021-11-14 DIAGNOSIS — Z7901 Long term (current) use of anticoagulants: Secondary | ICD-10-CM | POA: Diagnosis not present

## 2021-11-14 DIAGNOSIS — Z86010 Personal history of colonic polyps: Secondary | ICD-10-CM | POA: Diagnosis not present

## 2021-11-14 DIAGNOSIS — M329 Systemic lupus erythematosus, unspecified: Secondary | ICD-10-CM | POA: Diagnosis not present

## 2021-11-14 DIAGNOSIS — Z7984 Long term (current) use of oral hypoglycemic drugs: Secondary | ICD-10-CM | POA: Diagnosis not present

## 2021-11-14 DIAGNOSIS — K754 Autoimmune hepatitis: Secondary | ICD-10-CM | POA: Diagnosis not present

## 2021-11-14 DIAGNOSIS — F17210 Nicotine dependence, cigarettes, uncomplicated: Secondary | ICD-10-CM | POA: Diagnosis not present

## 2021-11-14 DIAGNOSIS — R197 Diarrhea, unspecified: Secondary | ICD-10-CM | POA: Diagnosis not present

## 2021-11-14 DIAGNOSIS — J90 Pleural effusion, not elsewhere classified: Secondary | ICD-10-CM | POA: Diagnosis not present

## 2021-11-14 DIAGNOSIS — R109 Unspecified abdominal pain: Secondary | ICD-10-CM | POA: Diagnosis not present

## 2021-11-14 DIAGNOSIS — M549 Dorsalgia, unspecified: Secondary | ICD-10-CM | POA: Diagnosis not present

## 2021-11-14 DIAGNOSIS — I5021 Acute systolic (congestive) heart failure: Secondary | ICD-10-CM | POA: Diagnosis not present

## 2021-11-14 DIAGNOSIS — K766 Portal hypertension: Secondary | ICD-10-CM | POA: Diagnosis not present

## 2021-11-14 DIAGNOSIS — M4690 Unspecified inflammatory spondylopathy, site unspecified: Secondary | ICD-10-CM | POA: Diagnosis not present

## 2021-11-14 DIAGNOSIS — M810 Age-related osteoporosis without current pathological fracture: Secondary | ICD-10-CM | POA: Diagnosis not present

## 2021-11-14 DIAGNOSIS — I110 Hypertensive heart disease with heart failure: Secondary | ICD-10-CM | POA: Diagnosis not present

## 2021-11-14 DIAGNOSIS — K219 Gastro-esophageal reflux disease without esophagitis: Secondary | ICD-10-CM | POA: Diagnosis not present

## 2021-11-14 DIAGNOSIS — E119 Type 2 diabetes mellitus without complications: Secondary | ICD-10-CM | POA: Diagnosis not present

## 2021-11-14 DIAGNOSIS — Z7951 Long term (current) use of inhaled steroids: Secondary | ICD-10-CM | POA: Diagnosis not present

## 2021-11-14 DIAGNOSIS — I5020 Unspecified systolic (congestive) heart failure: Secondary | ICD-10-CM | POA: Diagnosis not present

## 2021-11-14 DIAGNOSIS — J45909 Unspecified asthma, uncomplicated: Secondary | ICD-10-CM | POA: Diagnosis not present

## 2021-11-14 NOTE — Telephone Encounter (Signed)
HFU offered with PCP, declined. Following up with [facility] post hospital discharge.

## 2021-11-19 DIAGNOSIS — I4891 Unspecified atrial fibrillation: Secondary | ICD-10-CM | POA: Diagnosis not present

## 2021-11-19 DIAGNOSIS — K7460 Unspecified cirrhosis of liver: Secondary | ICD-10-CM | POA: Diagnosis not present

## 2021-11-19 DIAGNOSIS — K219 Gastro-esophageal reflux disease without esophagitis: Secondary | ICD-10-CM | POA: Diagnosis not present

## 2021-11-19 DIAGNOSIS — R109 Unspecified abdominal pain: Secondary | ICD-10-CM | POA: Diagnosis not present

## 2021-11-19 DIAGNOSIS — K754 Autoimmune hepatitis: Secondary | ICD-10-CM | POA: Diagnosis not present

## 2021-11-19 DIAGNOSIS — I110 Hypertensive heart disease with heart failure: Secondary | ICD-10-CM | POA: Diagnosis not present

## 2021-11-19 DIAGNOSIS — D539 Nutritional anemia, unspecified: Secondary | ICD-10-CM | POA: Diagnosis not present

## 2021-11-19 DIAGNOSIS — Z7901 Long term (current) use of anticoagulants: Secondary | ICD-10-CM | POA: Diagnosis not present

## 2021-11-19 DIAGNOSIS — Z7951 Long term (current) use of inhaled steroids: Secondary | ICD-10-CM | POA: Diagnosis not present

## 2021-11-19 DIAGNOSIS — E119 Type 2 diabetes mellitus without complications: Secondary | ICD-10-CM | POA: Diagnosis not present

## 2021-11-19 DIAGNOSIS — M4690 Unspecified inflammatory spondylopathy, site unspecified: Secondary | ICD-10-CM | POA: Diagnosis not present

## 2021-11-19 DIAGNOSIS — I5021 Acute systolic (congestive) heart failure: Secondary | ICD-10-CM | POA: Diagnosis not present

## 2021-11-19 DIAGNOSIS — R197 Diarrhea, unspecified: Secondary | ICD-10-CM | POA: Diagnosis not present

## 2021-11-19 DIAGNOSIS — M810 Age-related osteoporosis without current pathological fracture: Secondary | ICD-10-CM | POA: Diagnosis not present

## 2021-11-19 DIAGNOSIS — Z7984 Long term (current) use of oral hypoglycemic drugs: Secondary | ICD-10-CM | POA: Diagnosis not present

## 2021-11-19 DIAGNOSIS — M329 Systemic lupus erythematosus, unspecified: Secondary | ICD-10-CM | POA: Diagnosis not present

## 2021-11-19 DIAGNOSIS — F17210 Nicotine dependence, cigarettes, uncomplicated: Secondary | ICD-10-CM | POA: Diagnosis not present

## 2021-11-19 DIAGNOSIS — Z86010 Personal history of colonic polyps: Secondary | ICD-10-CM | POA: Diagnosis not present

## 2021-11-19 DIAGNOSIS — J45909 Unspecified asthma, uncomplicated: Secondary | ICD-10-CM | POA: Diagnosis not present

## 2021-11-19 DIAGNOSIS — I420 Dilated cardiomyopathy: Secondary | ICD-10-CM | POA: Diagnosis not present

## 2021-11-19 DIAGNOSIS — K766 Portal hypertension: Secondary | ICD-10-CM | POA: Diagnosis not present

## 2021-11-19 DIAGNOSIS — M1990 Unspecified osteoarthritis, unspecified site: Secondary | ICD-10-CM | POA: Diagnosis not present

## 2021-11-19 DIAGNOSIS — M549 Dorsalgia, unspecified: Secondary | ICD-10-CM | POA: Diagnosis not present

## 2021-11-21 DIAGNOSIS — Z7951 Long term (current) use of inhaled steroids: Secondary | ICD-10-CM | POA: Diagnosis not present

## 2021-11-21 DIAGNOSIS — F17210 Nicotine dependence, cigarettes, uncomplicated: Secondary | ICD-10-CM | POA: Diagnosis not present

## 2021-11-21 DIAGNOSIS — I420 Dilated cardiomyopathy: Secondary | ICD-10-CM | POA: Diagnosis not present

## 2021-11-21 DIAGNOSIS — M4690 Unspecified inflammatory spondylopathy, site unspecified: Secondary | ICD-10-CM | POA: Diagnosis not present

## 2021-11-21 DIAGNOSIS — E119 Type 2 diabetes mellitus without complications: Secondary | ICD-10-CM | POA: Diagnosis not present

## 2021-11-21 DIAGNOSIS — K766 Portal hypertension: Secondary | ICD-10-CM | POA: Diagnosis not present

## 2021-11-21 DIAGNOSIS — I110 Hypertensive heart disease with heart failure: Secondary | ICD-10-CM | POA: Diagnosis not present

## 2021-11-21 DIAGNOSIS — M549 Dorsalgia, unspecified: Secondary | ICD-10-CM | POA: Diagnosis not present

## 2021-11-21 DIAGNOSIS — M329 Systemic lupus erythematosus, unspecified: Secondary | ICD-10-CM | POA: Diagnosis not present

## 2021-11-21 DIAGNOSIS — R109 Unspecified abdominal pain: Secondary | ICD-10-CM | POA: Diagnosis not present

## 2021-11-21 DIAGNOSIS — Z86010 Personal history of colonic polyps: Secondary | ICD-10-CM | POA: Diagnosis not present

## 2021-11-21 DIAGNOSIS — D539 Nutritional anemia, unspecified: Secondary | ICD-10-CM | POA: Diagnosis not present

## 2021-11-21 DIAGNOSIS — M810 Age-related osteoporosis without current pathological fracture: Secondary | ICD-10-CM | POA: Diagnosis not present

## 2021-11-21 DIAGNOSIS — K219 Gastro-esophageal reflux disease without esophagitis: Secondary | ICD-10-CM | POA: Diagnosis not present

## 2021-11-21 DIAGNOSIS — M1990 Unspecified osteoarthritis, unspecified site: Secondary | ICD-10-CM | POA: Diagnosis not present

## 2021-11-21 DIAGNOSIS — J45909 Unspecified asthma, uncomplicated: Secondary | ICD-10-CM | POA: Diagnosis not present

## 2021-11-21 DIAGNOSIS — I5021 Acute systolic (congestive) heart failure: Secondary | ICD-10-CM | POA: Diagnosis not present

## 2021-11-21 DIAGNOSIS — Z7984 Long term (current) use of oral hypoglycemic drugs: Secondary | ICD-10-CM | POA: Diagnosis not present

## 2021-11-21 DIAGNOSIS — K754 Autoimmune hepatitis: Secondary | ICD-10-CM | POA: Diagnosis not present

## 2021-11-21 DIAGNOSIS — I4891 Unspecified atrial fibrillation: Secondary | ICD-10-CM | POA: Diagnosis not present

## 2021-11-21 DIAGNOSIS — R197 Diarrhea, unspecified: Secondary | ICD-10-CM | POA: Diagnosis not present

## 2021-11-21 DIAGNOSIS — K7460 Unspecified cirrhosis of liver: Secondary | ICD-10-CM | POA: Diagnosis not present

## 2021-11-21 DIAGNOSIS — Z7901 Long term (current) use of anticoagulants: Secondary | ICD-10-CM | POA: Diagnosis not present

## 2021-11-26 DIAGNOSIS — R197 Diarrhea, unspecified: Secondary | ICD-10-CM | POA: Diagnosis not present

## 2021-11-26 DIAGNOSIS — M4690 Unspecified inflammatory spondylopathy, site unspecified: Secondary | ICD-10-CM | POA: Diagnosis not present

## 2021-11-26 DIAGNOSIS — I4891 Unspecified atrial fibrillation: Secondary | ICD-10-CM | POA: Diagnosis not present

## 2021-11-26 DIAGNOSIS — K219 Gastro-esophageal reflux disease without esophagitis: Secondary | ICD-10-CM | POA: Diagnosis not present

## 2021-11-26 DIAGNOSIS — I5021 Acute systolic (congestive) heart failure: Secondary | ICD-10-CM | POA: Diagnosis not present

## 2021-11-26 DIAGNOSIS — M329 Systemic lupus erythematosus, unspecified: Secondary | ICD-10-CM | POA: Diagnosis not present

## 2021-11-26 DIAGNOSIS — D539 Nutritional anemia, unspecified: Secondary | ICD-10-CM | POA: Diagnosis not present

## 2021-11-26 DIAGNOSIS — K754 Autoimmune hepatitis: Secondary | ICD-10-CM | POA: Diagnosis not present

## 2021-11-26 DIAGNOSIS — E119 Type 2 diabetes mellitus without complications: Secondary | ICD-10-CM | POA: Diagnosis not present

## 2021-11-26 DIAGNOSIS — Z7901 Long term (current) use of anticoagulants: Secondary | ICD-10-CM | POA: Diagnosis not present

## 2021-11-26 DIAGNOSIS — Z7984 Long term (current) use of oral hypoglycemic drugs: Secondary | ICD-10-CM | POA: Diagnosis not present

## 2021-11-26 DIAGNOSIS — M549 Dorsalgia, unspecified: Secondary | ICD-10-CM | POA: Diagnosis not present

## 2021-11-26 DIAGNOSIS — M810 Age-related osteoporosis without current pathological fracture: Secondary | ICD-10-CM | POA: Diagnosis not present

## 2021-11-26 DIAGNOSIS — Z86010 Personal history of colonic polyps: Secondary | ICD-10-CM | POA: Diagnosis not present

## 2021-11-26 DIAGNOSIS — M1990 Unspecified osteoarthritis, unspecified site: Secondary | ICD-10-CM | POA: Diagnosis not present

## 2021-11-26 DIAGNOSIS — K766 Portal hypertension: Secondary | ICD-10-CM | POA: Diagnosis not present

## 2021-11-26 DIAGNOSIS — I110 Hypertensive heart disease with heart failure: Secondary | ICD-10-CM | POA: Diagnosis not present

## 2021-11-26 DIAGNOSIS — I420 Dilated cardiomyopathy: Secondary | ICD-10-CM | POA: Diagnosis not present

## 2021-11-26 DIAGNOSIS — K7460 Unspecified cirrhosis of liver: Secondary | ICD-10-CM | POA: Diagnosis not present

## 2021-11-26 DIAGNOSIS — F17210 Nicotine dependence, cigarettes, uncomplicated: Secondary | ICD-10-CM | POA: Diagnosis not present

## 2021-11-26 DIAGNOSIS — Z7951 Long term (current) use of inhaled steroids: Secondary | ICD-10-CM | POA: Diagnosis not present

## 2021-11-26 DIAGNOSIS — R109 Unspecified abdominal pain: Secondary | ICD-10-CM | POA: Diagnosis not present

## 2021-11-26 DIAGNOSIS — J45909 Unspecified asthma, uncomplicated: Secondary | ICD-10-CM | POA: Diagnosis not present

## 2021-11-28 DIAGNOSIS — F17200 Nicotine dependence, unspecified, uncomplicated: Secondary | ICD-10-CM | POA: Diagnosis not present

## 2021-11-28 DIAGNOSIS — I5020 Unspecified systolic (congestive) heart failure: Secondary | ICD-10-CM | POA: Diagnosis not present

## 2021-11-28 DIAGNOSIS — I4891 Unspecified atrial fibrillation: Secondary | ICD-10-CM | POA: Diagnosis not present

## 2021-11-28 DIAGNOSIS — Z7189 Other specified counseling: Secondary | ICD-10-CM | POA: Diagnosis not present

## 2021-11-29 DIAGNOSIS — Z7901 Long term (current) use of anticoagulants: Secondary | ICD-10-CM | POA: Diagnosis not present

## 2021-11-29 DIAGNOSIS — K766 Portal hypertension: Secondary | ICD-10-CM | POA: Diagnosis not present

## 2021-11-29 DIAGNOSIS — I5021 Acute systolic (congestive) heart failure: Secondary | ICD-10-CM | POA: Diagnosis not present

## 2021-11-29 DIAGNOSIS — R109 Unspecified abdominal pain: Secondary | ICD-10-CM | POA: Diagnosis not present

## 2021-11-29 DIAGNOSIS — M4690 Unspecified inflammatory spondylopathy, site unspecified: Secondary | ICD-10-CM | POA: Diagnosis not present

## 2021-11-29 DIAGNOSIS — I4891 Unspecified atrial fibrillation: Secondary | ICD-10-CM | POA: Diagnosis not present

## 2021-11-29 DIAGNOSIS — R197 Diarrhea, unspecified: Secondary | ICD-10-CM | POA: Diagnosis not present

## 2021-11-29 DIAGNOSIS — D539 Nutritional anemia, unspecified: Secondary | ICD-10-CM | POA: Diagnosis not present

## 2021-11-29 DIAGNOSIS — F17210 Nicotine dependence, cigarettes, uncomplicated: Secondary | ICD-10-CM | POA: Diagnosis not present

## 2021-11-29 DIAGNOSIS — K7460 Unspecified cirrhosis of liver: Secondary | ICD-10-CM | POA: Diagnosis not present

## 2021-11-29 DIAGNOSIS — K754 Autoimmune hepatitis: Secondary | ICD-10-CM | POA: Diagnosis not present

## 2021-11-29 DIAGNOSIS — M549 Dorsalgia, unspecified: Secondary | ICD-10-CM | POA: Diagnosis not present

## 2021-11-29 DIAGNOSIS — M1990 Unspecified osteoarthritis, unspecified site: Secondary | ICD-10-CM | POA: Diagnosis not present

## 2021-11-29 DIAGNOSIS — Z7951 Long term (current) use of inhaled steroids: Secondary | ICD-10-CM | POA: Diagnosis not present

## 2021-11-29 DIAGNOSIS — K219 Gastro-esophageal reflux disease without esophagitis: Secondary | ICD-10-CM | POA: Diagnosis not present

## 2021-11-29 DIAGNOSIS — Z7984 Long term (current) use of oral hypoglycemic drugs: Secondary | ICD-10-CM | POA: Diagnosis not present

## 2021-11-29 DIAGNOSIS — E119 Type 2 diabetes mellitus without complications: Secondary | ICD-10-CM | POA: Diagnosis not present

## 2021-11-29 DIAGNOSIS — M810 Age-related osteoporosis without current pathological fracture: Secondary | ICD-10-CM | POA: Diagnosis not present

## 2021-11-29 DIAGNOSIS — M329 Systemic lupus erythematosus, unspecified: Secondary | ICD-10-CM | POA: Diagnosis not present

## 2021-11-29 DIAGNOSIS — I420 Dilated cardiomyopathy: Secondary | ICD-10-CM | POA: Diagnosis not present

## 2021-11-29 DIAGNOSIS — I110 Hypertensive heart disease with heart failure: Secondary | ICD-10-CM | POA: Diagnosis not present

## 2021-11-29 DIAGNOSIS — Z86010 Personal history of colonic polyps: Secondary | ICD-10-CM | POA: Diagnosis not present

## 2021-11-29 DIAGNOSIS — J45909 Unspecified asthma, uncomplicated: Secondary | ICD-10-CM | POA: Diagnosis not present

## 2021-12-05 DIAGNOSIS — K754 Autoimmune hepatitis: Secondary | ICD-10-CM | POA: Diagnosis not present

## 2021-12-05 DIAGNOSIS — M4690 Unspecified inflammatory spondylopathy, site unspecified: Secondary | ICD-10-CM | POA: Diagnosis not present

## 2021-12-05 DIAGNOSIS — M549 Dorsalgia, unspecified: Secondary | ICD-10-CM | POA: Diagnosis not present

## 2021-12-05 DIAGNOSIS — K219 Gastro-esophageal reflux disease without esophagitis: Secondary | ICD-10-CM | POA: Diagnosis not present

## 2021-12-05 DIAGNOSIS — M810 Age-related osteoporosis without current pathological fracture: Secondary | ICD-10-CM | POA: Diagnosis not present

## 2021-12-05 DIAGNOSIS — I5021 Acute systolic (congestive) heart failure: Secondary | ICD-10-CM | POA: Diagnosis not present

## 2021-12-05 DIAGNOSIS — I420 Dilated cardiomyopathy: Secondary | ICD-10-CM | POA: Diagnosis not present

## 2021-12-05 DIAGNOSIS — D539 Nutritional anemia, unspecified: Secondary | ICD-10-CM | POA: Diagnosis not present

## 2021-12-05 DIAGNOSIS — Z7901 Long term (current) use of anticoagulants: Secondary | ICD-10-CM | POA: Diagnosis not present

## 2021-12-05 DIAGNOSIS — M329 Systemic lupus erythematosus, unspecified: Secondary | ICD-10-CM | POA: Diagnosis not present

## 2021-12-05 DIAGNOSIS — R109 Unspecified abdominal pain: Secondary | ICD-10-CM | POA: Diagnosis not present

## 2021-12-05 DIAGNOSIS — I4891 Unspecified atrial fibrillation: Secondary | ICD-10-CM | POA: Diagnosis not present

## 2021-12-05 DIAGNOSIS — F17210 Nicotine dependence, cigarettes, uncomplicated: Secondary | ICD-10-CM | POA: Diagnosis not present

## 2021-12-05 DIAGNOSIS — Z7984 Long term (current) use of oral hypoglycemic drugs: Secondary | ICD-10-CM | POA: Diagnosis not present

## 2021-12-05 DIAGNOSIS — M1990 Unspecified osteoarthritis, unspecified site: Secondary | ICD-10-CM | POA: Diagnosis not present

## 2021-12-05 DIAGNOSIS — K7460 Unspecified cirrhosis of liver: Secondary | ICD-10-CM | POA: Diagnosis not present

## 2021-12-05 DIAGNOSIS — K766 Portal hypertension: Secondary | ICD-10-CM | POA: Diagnosis not present

## 2021-12-05 DIAGNOSIS — E119 Type 2 diabetes mellitus without complications: Secondary | ICD-10-CM | POA: Diagnosis not present

## 2021-12-05 DIAGNOSIS — Z7951 Long term (current) use of inhaled steroids: Secondary | ICD-10-CM | POA: Diagnosis not present

## 2021-12-05 DIAGNOSIS — R197 Diarrhea, unspecified: Secondary | ICD-10-CM | POA: Diagnosis not present

## 2021-12-05 DIAGNOSIS — Z86010 Personal history of colonic polyps: Secondary | ICD-10-CM | POA: Diagnosis not present

## 2021-12-05 DIAGNOSIS — I110 Hypertensive heart disease with heart failure: Secondary | ICD-10-CM | POA: Diagnosis not present

## 2021-12-05 DIAGNOSIS — J45909 Unspecified asthma, uncomplicated: Secondary | ICD-10-CM | POA: Diagnosis not present

## 2021-12-07 DIAGNOSIS — R001 Bradycardia, unspecified: Secondary | ICD-10-CM | POA: Diagnosis not present

## 2021-12-07 DIAGNOSIS — E119 Type 2 diabetes mellitus without complications: Secondary | ICD-10-CM | POA: Diagnosis not present

## 2021-12-07 DIAGNOSIS — I493 Ventricular premature depolarization: Secondary | ICD-10-CM | POA: Diagnosis not present

## 2021-12-07 DIAGNOSIS — I5020 Unspecified systolic (congestive) heart failure: Secondary | ICD-10-CM | POA: Diagnosis not present

## 2021-12-07 DIAGNOSIS — F17210 Nicotine dependence, cigarettes, uncomplicated: Secondary | ICD-10-CM | POA: Diagnosis not present

## 2021-12-07 DIAGNOSIS — I420 Dilated cardiomyopathy: Secondary | ICD-10-CM | POA: Diagnosis not present

## 2021-12-07 DIAGNOSIS — K7460 Unspecified cirrhosis of liver: Secondary | ICD-10-CM | POA: Diagnosis not present

## 2021-12-07 DIAGNOSIS — R54 Age-related physical debility: Secondary | ICD-10-CM | POA: Diagnosis not present

## 2021-12-07 DIAGNOSIS — Z7901 Long term (current) use of anticoagulants: Secondary | ICD-10-CM | POA: Diagnosis not present

## 2021-12-07 DIAGNOSIS — I480 Paroxysmal atrial fibrillation: Secondary | ICD-10-CM | POA: Diagnosis not present

## 2021-12-07 DIAGNOSIS — I517 Cardiomegaly: Secondary | ICD-10-CM | POA: Diagnosis not present

## 2021-12-07 DIAGNOSIS — K754 Autoimmune hepatitis: Secondary | ICD-10-CM | POA: Diagnosis not present

## 2021-12-07 DIAGNOSIS — M459 Ankylosing spondylitis of unspecified sites in spine: Secondary | ICD-10-CM | POA: Diagnosis not present

## 2021-12-07 DIAGNOSIS — I429 Cardiomyopathy, unspecified: Secondary | ICD-10-CM | POA: Diagnosis not present

## 2021-12-07 DIAGNOSIS — I447 Left bundle-branch block, unspecified: Secondary | ICD-10-CM | POA: Diagnosis not present

## 2021-12-07 DIAGNOSIS — D539 Nutritional anemia, unspecified: Secondary | ICD-10-CM | POA: Diagnosis not present

## 2021-12-07 DIAGNOSIS — R0989 Other specified symptoms and signs involving the circulatory and respiratory systems: Secondary | ICD-10-CM | POA: Diagnosis not present

## 2021-12-07 DIAGNOSIS — I959 Hypotension, unspecified: Secondary | ICD-10-CM | POA: Diagnosis not present

## 2021-12-07 DIAGNOSIS — R5383 Other fatigue: Secondary | ICD-10-CM | POA: Diagnosis not present

## 2021-12-07 DIAGNOSIS — I5022 Chronic systolic (congestive) heart failure: Secondary | ICD-10-CM | POA: Diagnosis not present

## 2021-12-07 DIAGNOSIS — I482 Chronic atrial fibrillation, unspecified: Secondary | ICD-10-CM | POA: Diagnosis not present

## 2021-12-07 DIAGNOSIS — K219 Gastro-esophageal reflux disease without esophagitis: Secondary | ICD-10-CM | POA: Diagnosis not present

## 2021-12-07 DIAGNOSIS — R519 Headache, unspecified: Secondary | ICD-10-CM | POA: Diagnosis not present

## 2021-12-07 DIAGNOSIS — K766 Portal hypertension: Secondary | ICD-10-CM | POA: Diagnosis not present

## 2021-12-07 DIAGNOSIS — I509 Heart failure, unspecified: Secondary | ICD-10-CM | POA: Diagnosis not present

## 2021-12-07 DIAGNOSIS — K7469 Other cirrhosis of liver: Secondary | ICD-10-CM | POA: Diagnosis not present

## 2021-12-07 DIAGNOSIS — E876 Hypokalemia: Secondary | ICD-10-CM | POA: Diagnosis not present

## 2021-12-07 DIAGNOSIS — I4891 Unspecified atrial fibrillation: Secondary | ICD-10-CM | POA: Diagnosis not present

## 2021-12-07 DIAGNOSIS — I5030 Unspecified diastolic (congestive) heart failure: Secondary | ICD-10-CM | POA: Diagnosis not present

## 2021-12-08 DIAGNOSIS — I493 Ventricular premature depolarization: Secondary | ICD-10-CM | POA: Diagnosis not present

## 2021-12-08 DIAGNOSIS — R5383 Other fatigue: Secondary | ICD-10-CM | POA: Diagnosis not present

## 2021-12-08 DIAGNOSIS — I480 Paroxysmal atrial fibrillation: Secondary | ICD-10-CM | POA: Diagnosis not present

## 2021-12-08 DIAGNOSIS — I447 Left bundle-branch block, unspecified: Secondary | ICD-10-CM | POA: Diagnosis not present

## 2021-12-08 DIAGNOSIS — R001 Bradycardia, unspecified: Secondary | ICD-10-CM | POA: Insufficient documentation

## 2021-12-08 DIAGNOSIS — I5020 Unspecified systolic (congestive) heart failure: Secondary | ICD-10-CM | POA: Diagnosis not present

## 2021-12-09 DIAGNOSIS — R001 Bradycardia, unspecified: Secondary | ICD-10-CM | POA: Diagnosis not present

## 2021-12-09 DIAGNOSIS — I493 Ventricular premature depolarization: Secondary | ICD-10-CM | POA: Diagnosis not present

## 2021-12-09 DIAGNOSIS — I482 Chronic atrial fibrillation, unspecified: Secondary | ICD-10-CM | POA: Diagnosis not present

## 2021-12-09 DIAGNOSIS — R5383 Other fatigue: Secondary | ICD-10-CM | POA: Diagnosis not present

## 2021-12-10 DIAGNOSIS — I480 Paroxysmal atrial fibrillation: Secondary | ICD-10-CM | POA: Diagnosis not present

## 2021-12-10 DIAGNOSIS — R001 Bradycardia, unspecified: Secondary | ICD-10-CM | POA: Diagnosis not present

## 2021-12-10 DIAGNOSIS — I429 Cardiomyopathy, unspecified: Secondary | ICD-10-CM | POA: Diagnosis not present

## 2021-12-10 DIAGNOSIS — I5020 Unspecified systolic (congestive) heart failure: Secondary | ICD-10-CM | POA: Diagnosis not present

## 2021-12-10 DIAGNOSIS — I4891 Unspecified atrial fibrillation: Secondary | ICD-10-CM | POA: Diagnosis not present

## 2021-12-10 DIAGNOSIS — F17210 Nicotine dependence, cigarettes, uncomplicated: Secondary | ICD-10-CM | POA: Diagnosis not present

## 2021-12-10 DIAGNOSIS — K7460 Unspecified cirrhosis of liver: Secondary | ICD-10-CM | POA: Diagnosis not present

## 2021-12-10 DIAGNOSIS — I493 Ventricular premature depolarization: Secondary | ICD-10-CM | POA: Diagnosis not present

## 2021-12-10 DIAGNOSIS — K754 Autoimmune hepatitis: Secondary | ICD-10-CM | POA: Diagnosis not present

## 2021-12-11 DIAGNOSIS — I517 Cardiomegaly: Secondary | ICD-10-CM | POA: Diagnosis not present

## 2021-12-11 DIAGNOSIS — I480 Paroxysmal atrial fibrillation: Secondary | ICD-10-CM | POA: Diagnosis not present

## 2021-12-11 DIAGNOSIS — I959 Hypotension, unspecified: Secondary | ICD-10-CM | POA: Diagnosis not present

## 2021-12-11 DIAGNOSIS — R5383 Other fatigue: Secondary | ICD-10-CM | POA: Diagnosis not present

## 2021-12-11 DIAGNOSIS — R001 Bradycardia, unspecified: Secondary | ICD-10-CM | POA: Diagnosis not present

## 2021-12-12 DIAGNOSIS — I429 Cardiomyopathy, unspecified: Secondary | ICD-10-CM | POA: Diagnosis not present

## 2021-12-12 DIAGNOSIS — I5020 Unspecified systolic (congestive) heart failure: Secondary | ICD-10-CM | POA: Diagnosis not present

## 2021-12-12 DIAGNOSIS — I493 Ventricular premature depolarization: Secondary | ICD-10-CM | POA: Diagnosis not present

## 2021-12-13 ENCOUNTER — Telehealth: Payer: Self-pay

## 2021-12-13 ENCOUNTER — Other Ambulatory Visit: Payer: Self-pay | Admitting: *Deleted

## 2021-12-13 NOTE — Telephone Encounter (Signed)
Transition Care Management Unsuccessful Follow-up Telephone Call  Date of discharge and from where:  12/12/21 [facility]  Attempts:  1st Attempt  Reason for unsuccessful TCM follow-up call:  Unable to reach patient

## 2021-12-13 NOTE — Patient Outreach (Signed)
  Care Coordination [facility] Note Transition Care Management Follow-up Telephone Call Date of discharge and from where: 12/12/21 [facility] How have you been since you were released from the hospital? Patient states she is feeling weak  Any questions or concerns? No  Items Reviewed: Did the pt receive and understand the discharge instructions provided? Yes  Medications obtained and verified? Yes  Other? No  Any new allergies since your discharge? No  Dietary orders reviewed? Yes Do you have support at home? Yes   Home Care and Equipment/Supplies: Were home health services ordered? no If so, what is the name of the agency? N/A  Has the agency set up a time to come to the patient's home? not applicable Were any new equipment or medical supplies ordered?  No What is the name of the medical supply agency? N/A Were you able to get the supplies/equipment? not applicable Do you have any questions related to the use of the equipment or supplies? No  Functional Questionnaire: (I = Independent and D = Dependent) ADLs: I  Bathing/Dressing- I  Meal Prep- I  Eating- I  Maintaining continence- I  Transferring/Ambulation- I  Managing Meds- I  Follow up appointments reviewed:  PCP Hospital f/u appt confirmed? No   Specialist Hospital f/u appt confirmed? Yes  Scheduled to see Dr. Charletta Cousin on 12/26/21 @ 1300. Are transportation arrangements needed? No  If their condition worsens, is the pt aware to call PCP or go to the Emergency Dept.? Yes Was the patient provided with contact information for the PCP's office or ED? Yes Was to pt encouraged to call back with questions or concerns? Yes  SDOH assessments and interventions completed:   Yes  Care Coordination Interventions Activated:  No   Care Coordination Interventions:   N/A     Encounter Outcome:  Pt. Visit Completed    Emelia Loron RN, BSN [place] [phone] [email or web address]

## 2021-12-14 DIAGNOSIS — M4690 Unspecified inflammatory spondylopathy, site unspecified: Secondary | ICD-10-CM | POA: Diagnosis not present

## 2021-12-14 DIAGNOSIS — K219 Gastro-esophageal reflux disease without esophagitis: Secondary | ICD-10-CM | POA: Diagnosis not present

## 2021-12-14 DIAGNOSIS — I420 Dilated cardiomyopathy: Secondary | ICD-10-CM | POA: Diagnosis not present

## 2021-12-14 DIAGNOSIS — K754 Autoimmune hepatitis: Secondary | ICD-10-CM | POA: Diagnosis not present

## 2021-12-14 DIAGNOSIS — Z48812 Encounter for surgical aftercare following surgery on the circulatory system: Secondary | ICD-10-CM | POA: Diagnosis not present

## 2021-12-14 DIAGNOSIS — R197 Diarrhea, unspecified: Secondary | ICD-10-CM | POA: Diagnosis not present

## 2021-12-14 DIAGNOSIS — M810 Age-related osteoporosis without current pathological fracture: Secondary | ICD-10-CM | POA: Diagnosis not present

## 2021-12-14 DIAGNOSIS — I5021 Acute systolic (congestive) heart failure: Secondary | ICD-10-CM | POA: Diagnosis not present

## 2021-12-14 DIAGNOSIS — K7460 Unspecified cirrhosis of liver: Secondary | ICD-10-CM | POA: Diagnosis not present

## 2021-12-14 DIAGNOSIS — E119 Type 2 diabetes mellitus without complications: Secondary | ICD-10-CM | POA: Diagnosis not present

## 2021-12-14 DIAGNOSIS — I493 Ventricular premature depolarization: Secondary | ICD-10-CM | POA: Diagnosis not present

## 2021-12-14 DIAGNOSIS — K766 Portal hypertension: Secondary | ICD-10-CM | POA: Diagnosis not present

## 2021-12-14 DIAGNOSIS — I447 Left bundle-branch block, unspecified: Secondary | ICD-10-CM | POA: Diagnosis not present

## 2021-12-14 DIAGNOSIS — I081 Rheumatic disorders of both mitral and tricuspid valves: Secondary | ICD-10-CM | POA: Diagnosis not present

## 2021-12-14 DIAGNOSIS — M329 Systemic lupus erythematosus, unspecified: Secondary | ICD-10-CM | POA: Diagnosis not present

## 2021-12-14 DIAGNOSIS — M549 Dorsalgia, unspecified: Secondary | ICD-10-CM | POA: Diagnosis not present

## 2021-12-14 DIAGNOSIS — F17210 Nicotine dependence, cigarettes, uncomplicated: Secondary | ICD-10-CM | POA: Diagnosis not present

## 2021-12-14 DIAGNOSIS — M1990 Unspecified osteoarthritis, unspecified site: Secondary | ICD-10-CM | POA: Diagnosis not present

## 2021-12-14 DIAGNOSIS — I480 Paroxysmal atrial fibrillation: Secondary | ICD-10-CM | POA: Diagnosis not present

## 2021-12-14 DIAGNOSIS — I272 Pulmonary hypertension, unspecified: Secondary | ICD-10-CM | POA: Diagnosis not present

## 2021-12-14 DIAGNOSIS — D539 Nutritional anemia, unspecified: Secondary | ICD-10-CM | POA: Diagnosis not present

## 2021-12-14 DIAGNOSIS — I110 Hypertensive heart disease with heart failure: Secondary | ICD-10-CM | POA: Diagnosis not present

## 2021-12-14 DIAGNOSIS — J45909 Unspecified asthma, uncomplicated: Secondary | ICD-10-CM | POA: Diagnosis not present

## 2021-12-16 NOTE — Telephone Encounter (Signed)
Transition Care Management Follow-up Telephone Call Date of discharge and from where: 8/3/23[facility] How have you been since you were released from the hospital? Patient notes "I am feeling stronger. Its a whirlwind since the new diagnosis but I am okay. My BS are controlled." Denies new or worsening symptoms, pain, n/v/d and all other harmful symptoms.  Any questions or concerns? No  Items Reviewed: Did the pt receive and understand the discharge instructions provided? Yes  Medications obtained and verified? Yes  Any new allergies since your discharge? No  Dietary orders reviewed? Yes Do you have support at home? Yes   Home Care and Equipment/Supplies: Were home health services ordered? Yes, currently in progress.  Functional Questionnaire: (I = Independent and D = Dependent) ADLs: I  Bathing/Dressing- I  Meal Prep- I  Eating- I  Maintaining continence- I  Transferring/Ambulation- I  Managing Meds- I  Follow up appointments reviewed:  PCP Hospital f/u appt confirmed? Yes  Scheduled to see Dr. Volanda Napoleon on 12/18/21 @ 10:00. [facility] f/u appt confirmed? Yes  Scheduled to see Cardiology on 12/26/21 @ [facility]. Are transportation arrangements needed? No  If their condition worsens, is the pt aware to call PCP or go to the Emergency Dept.? Yes Was the patient provided with contact information for the PCP's office or ED? Yes Was to pt encouraged to call back with questions or concerns? Yes

## 2021-12-16 NOTE — Telephone Encounter (Signed)
Transition Care Management Unsuccessful Follow-up Telephone Call  Date of discharge and from where:  12/12/21 [facility]  Attempts:  2nd Attempt  Reason for unsuccessful TCM follow-up call:  Unable to reach patient. Will follow as appropriate. HFU scheduled 12/18/21 @ 10:00.

## 2021-12-17 ENCOUNTER — Encounter: Payer: Self-pay | Admitting: Pharmacist

## 2021-12-17 DIAGNOSIS — E119 Type 2 diabetes mellitus without complications: Secondary | ICD-10-CM | POA: Diagnosis not present

## 2021-12-17 DIAGNOSIS — I110 Hypertensive heart disease with heart failure: Secondary | ICD-10-CM | POA: Diagnosis not present

## 2021-12-17 DIAGNOSIS — J45909 Unspecified asthma, uncomplicated: Secondary | ICD-10-CM | POA: Diagnosis not present

## 2021-12-17 DIAGNOSIS — K7460 Unspecified cirrhosis of liver: Secondary | ICD-10-CM | POA: Diagnosis not present

## 2021-12-17 DIAGNOSIS — M549 Dorsalgia, unspecified: Secondary | ICD-10-CM | POA: Diagnosis not present

## 2021-12-17 DIAGNOSIS — R197 Diarrhea, unspecified: Secondary | ICD-10-CM | POA: Diagnosis not present

## 2021-12-17 DIAGNOSIS — K219 Gastro-esophageal reflux disease without esophagitis: Secondary | ICD-10-CM | POA: Diagnosis not present

## 2021-12-17 DIAGNOSIS — I272 Pulmonary hypertension, unspecified: Secondary | ICD-10-CM | POA: Diagnosis not present

## 2021-12-17 DIAGNOSIS — I447 Left bundle-branch block, unspecified: Secondary | ICD-10-CM | POA: Diagnosis not present

## 2021-12-17 DIAGNOSIS — K766 Portal hypertension: Secondary | ICD-10-CM | POA: Diagnosis not present

## 2021-12-17 DIAGNOSIS — I420 Dilated cardiomyopathy: Secondary | ICD-10-CM | POA: Diagnosis not present

## 2021-12-17 DIAGNOSIS — G720 Drug-induced myopathy: Secondary | ICD-10-CM

## 2021-12-17 DIAGNOSIS — M810 Age-related osteoporosis without current pathological fracture: Secondary | ICD-10-CM | POA: Diagnosis not present

## 2021-12-17 DIAGNOSIS — F17210 Nicotine dependence, cigarettes, uncomplicated: Secondary | ICD-10-CM | POA: Diagnosis not present

## 2021-12-17 DIAGNOSIS — Z48812 Encounter for surgical aftercare following surgery on the circulatory system: Secondary | ICD-10-CM | POA: Diagnosis not present

## 2021-12-17 DIAGNOSIS — I480 Paroxysmal atrial fibrillation: Secondary | ICD-10-CM | POA: Diagnosis not present

## 2021-12-17 DIAGNOSIS — K754 Autoimmune hepatitis: Secondary | ICD-10-CM | POA: Diagnosis not present

## 2021-12-17 DIAGNOSIS — M1990 Unspecified osteoarthritis, unspecified site: Secondary | ICD-10-CM | POA: Diagnosis not present

## 2021-12-17 DIAGNOSIS — D539 Nutritional anemia, unspecified: Secondary | ICD-10-CM | POA: Diagnosis not present

## 2021-12-17 DIAGNOSIS — M4690 Unspecified inflammatory spondylopathy, site unspecified: Secondary | ICD-10-CM | POA: Diagnosis not present

## 2021-12-17 DIAGNOSIS — I493 Ventricular premature depolarization: Secondary | ICD-10-CM | POA: Diagnosis not present

## 2021-12-17 DIAGNOSIS — I5021 Acute systolic (congestive) heart failure: Secondary | ICD-10-CM | POA: Diagnosis not present

## 2021-12-17 DIAGNOSIS — M329 Systemic lupus erythematosus, unspecified: Secondary | ICD-10-CM | POA: Diagnosis not present

## 2021-12-17 DIAGNOSIS — I081 Rheumatic disorders of both mitral and tricuspid valves: Secondary | ICD-10-CM | POA: Diagnosis not present

## 2021-12-17 NOTE — Progress Notes (Signed)
[facility])                                            [place] Team                                        Statin Quality Measure Assessment    12/17/2021  JENNAVECIA SCHWIER [birth year] [id number]    Per review of chart and payor information, patient has a diagnosis of diabetes but is not currently filling a statin prescription.  This places patient into the SUPD (Statin Use In Patients with Diabetes) measure for CMS.    Patient has documented trials of statins with reported myalgia, but no corresponding CPT codes that would exclude patient from SUPD measure.  She has an upcoming appointment for follow up on 12/18/21. If deemed therapeutically appropriate, a statin exclusion code could be associated with the visit, which would remove the patient from the measure or consider alternative statin dosing.  Please see the attached note for some common exclusion codes. G72.0 works for both [employer] and [employer].   The 10-year ASCVD risk score (Arnett DK, et al., 2019) is: 13.6%   Values used to calculate the score:     Age: 66 years     Sex: Female     Is Non-Hispanic African American: Yes     Diabetic: Yes     Tobacco smoker: Yes     Systolic Blood Pressure: 865 mmHg     Is BP treated: No     HDL Cholesterol: 43 mg/dL     Total Cholesterol: 141 mg/dL 03/20/2021     Component Value Date/Time   CHOL 159 03/20/2021 0834   TRIG 58 03/20/2021 0834   HDL 69 03/20/2021 0834   CHOLHDL 2.3 03/20/2021 0834   CHOLHDL 3 11/27/2020 0850   VLDL 14.6 11/27/2020 0850   LDLCALC 78 03/20/2021 0834   LDLCALC 151 (H) 03/01/2018 1534    Please consider ONE of the following recommendations:  Initiate high intensity statin Atorvastatin '40mg'$  once daily, #90, 3 refills   Rosuvastatin '20mg'$  once daily, #90, 3 refills    Initiate moderate intensity          statin with reduced frequency if prior          statin intolerance 1x weekly, #13, 3  refills   2x weekly, #26, 3 refills   3x weekly, #39, 3 refills    Code for past statin intolerance or  other exclusions (required annually)   Provider Requirements:  Associate code during an office visit or telehealth encounter  Drug Induced Myopathy G72.0   Myopathy, unspecified G72.9   Myositis, unspecified M60.9   Rhabdomyolysis H84.69   Alcoholic fatty liver G29.5   Cirrhosis of liver K74.69   Prediabetes R73.03   PCOS E28.2   Toxic liver disease, unspecified K71.9         Plan: Route note to PCP prior to upcoming appointment.  Elayne Guerin, PharmD, [place] Clinical Pharmacist [phone]

## 2021-12-18 ENCOUNTER — Encounter: Payer: Self-pay | Admitting: Family Medicine

## 2021-12-18 ENCOUNTER — Ambulatory Visit (INDEPENDENT_AMBULATORY_CARE_PROVIDER_SITE_OTHER): Payer: Medicare Other | Admitting: Family Medicine

## 2021-12-18 VITALS — BP 124/64 | HR 72 | Temp 96.9°F | Ht 66.0 in | Wt 175.8 lb

## 2021-12-18 DIAGNOSIS — E119 Type 2 diabetes mellitus without complications: Secondary | ICD-10-CM | POA: Diagnosis not present

## 2021-12-18 DIAGNOSIS — E785 Hyperlipidemia, unspecified: Secondary | ICD-10-CM

## 2021-12-18 DIAGNOSIS — E1165 Type 2 diabetes mellitus with hyperglycemia: Secondary | ICD-10-CM | POA: Diagnosis not present

## 2021-12-18 DIAGNOSIS — K766 Portal hypertension: Secondary | ICD-10-CM | POA: Diagnosis not present

## 2021-12-18 DIAGNOSIS — M791 Myalgia, unspecified site: Secondary | ICD-10-CM | POA: Diagnosis not present

## 2021-12-18 DIAGNOSIS — R748 Abnormal levels of other serum enzymes: Secondary | ICD-10-CM

## 2021-12-18 DIAGNOSIS — K754 Autoimmune hepatitis: Secondary | ICD-10-CM

## 2021-12-18 DIAGNOSIS — Z794 Long term (current) use of insulin: Secondary | ICD-10-CM | POA: Diagnosis not present

## 2021-12-18 DIAGNOSIS — E538 Deficiency of other specified B group vitamins: Secondary | ICD-10-CM | POA: Diagnosis not present

## 2021-12-18 DIAGNOSIS — I5020 Unspecified systolic (congestive) heart failure: Secondary | ICD-10-CM

## 2021-12-18 DIAGNOSIS — K3189 Other diseases of stomach and duodenum: Secondary | ICD-10-CM

## 2021-12-18 DIAGNOSIS — E559 Vitamin D deficiency, unspecified: Secondary | ICD-10-CM

## 2021-12-18 DIAGNOSIS — T466X5A Adverse effect of antihyperlipidemic and antiarteriosclerotic drugs, initial encounter: Secondary | ICD-10-CM

## 2021-12-18 LAB — COMPREHENSIVE METABOLIC PANEL
ALT: 29 U/L (ref 0–35)
AST: 31 U/L (ref 0–37)
Albumin: 3.6 g/dL (ref 3.5–5.2)
Alkaline Phosphatase: 61 U/L (ref 39–117)
BUN: 14 mg/dL (ref 6–23)
CO2: 31 mEq/L (ref 19–32)
Calcium: 9.1 mg/dL (ref 8.4–10.5)
Chloride: 105 mEq/L (ref 96–112)
Creatinine, Ser: 0.71 mg/dL (ref 0.40–1.20)
GFR: 90.32 mL/min (ref >60.00)
Glucose, Bld: 108 mg/dL — ABNORMAL HIGH (ref 70–99)
Potassium: 4.1 mEq/L (ref 3.5–5.1)
Sodium: 142 mEq/L (ref 135–145)
Total Bilirubin: 0.5 mg/dL (ref 0.2–1.2)
Total Protein: 6.5 g/dL (ref 6.0–8.3)

## 2021-12-18 LAB — MICROALBUMIN / CREATININE URINE RATIO
Creatinine,U: 137.8 mg/dL
Microalb Creat Ratio: 1 mg/g (ref 0.0–30.0)
Microalb, Ur: 1.3 mg/dL (ref 0.0–1.9)

## 2021-12-18 LAB — VITAMIN B12: Vitamin B-12: 999 pg/mL — ABNORMAL HIGH (ref 211–911)

## 2021-12-18 LAB — LDL CHOLESTEROL, DIRECT: Direct LDL: 60 mg/dL

## 2021-12-18 LAB — HEMOGLOBIN A1C: Hgb A1c MFr Bld: 6.8 % — ABNORMAL HIGH (ref 4.6–6.5)

## 2021-12-18 LAB — VITAMIN D 25 HYDROXY (VIT D DEFICIENCY, FRACTURES): VITD: 65.28 ng/mL (ref 30.00–100.00)

## 2021-12-18 LAB — MAGNESIUM: Magnesium: 1.8 mg/dL (ref 1.5–2.5)

## 2021-12-18 MED ORDER — AZATHIOPRINE 50 MG PO TABS: 200.0000 mg | ORAL_TABLET | Freq: Every day | ORAL | Status: AC

## 2021-12-18 MED ORDER — EMPAGLIFLOZIN 25 MG PO TABS: 25.0000 mg | ORAL_TABLET | Freq: Every day | ORAL | Status: DC

## 2021-12-18 MED ORDER — APIXABAN 5 MG PO TABS: 5.0000 mg | ORAL_TABLET | Freq: Two times a day (BID) | ORAL | 0 refills | Status: AC

## 2021-12-18 MED ORDER — METOPROLOL TARTRATE 50 MG PO TABS: 50.0000 mg | ORAL_TABLET | Freq: Two times a day (BID) | ORAL | 0 refills | Status: AC

## 2021-12-18 MED ORDER — FUROSEMIDE 40 MG PO TABS: 40.0000 mg | ORAL_TABLET | Freq: Every day | ORAL | 0 refills | Status: AC

## 2021-12-18 MED ORDER — MICONAZOLE 3 200 MG VA SUPP: 200.0000 mg | Freq: Every day | VAGINAL | 0 refills | Status: AC

## 2021-12-18 MED ORDER — SPIRONOLACTONE 25 MG PO TABS: 25.0000 mg | ORAL_TABLET | Freq: Every day | ORAL | 0 refills | Status: AC

## 2021-12-18 NOTE — Patient Instructions (Signed)
It was a pleasure meeting you today. Thank you for allowing me to take part in your health care.  Our goals for today as we discussed include:  Follow-up with cardiology as scheduled Continue current medications  We will get some labs today.  If they are abnormal or we need to do something about them, I will call you.  If they are normal, I will send you a message on MyChart (if it is active) or a letter in the mail.  If you don't hear from [place] in 2 weeks, please call the office at the number below.   For your diabetes Start your metformin 500 mg daily Continue Jardiance 25 mg daily  Prescription sent for miconazole suppositories.  Use as directed.   Please follow-up with PCP in 2-3 weeks  If you have any questions or concerns, please do not hesitate to call the office at [phone].  I look forward to our next visit and until then take care and stay safe.  Regards,   Carollee Leitz, MD   [facility]

## 2021-12-18 NOTE — Progress Notes (Signed)
    SUBJECTIVE:   CHIEF COMPLAINT / HPI: follow up hospital discharge  Patient admitted to hospital between 12/07/21 and 12/12/2021 and treated for symptomatic PVCs, HFrEF, autoimmune hepatitis, cirrhosis, hypomagnesemia, A-fib RVR. Discharged on 08/23. Presents today for follow up new diagnosis HFrEF. Reports improvement in symptoms.  Denies any heart palpitations, chest pain, increasing shortness of breath or lower extremity edema.  Requires repeat CMP/ renal function panel/CBC, magnesium today.  Diabetes type 2 Patient reports self discontinuation of Tresiba and metformin.  Currently taking Jardiance 5 mg daily was initiated for HFrEF.  Denies any abdominal pain, nausea, vomiting or polyuria.  CBGs at home 130s.  Recently has been 35s to 90s.  Recent medications during hospitalizations include Extent 5 mg twice daily Jardiance 25 mg daily Metoprolol 50 g daily Aldactone 25 mg daily Lasix 40 mg daily Digoxin was discontinued.  PERTINENT  PMH / PSH:  DM type II Autoimmune hepatitis Or cirrhosis HFrEF Adrenal insufficiency Hyperlipidemia Tobacco use    OBJECTIVE:   BP 124/64 (BP Location: Left Arm, Patient Position: Sitting, Cuff Size: Normal)   Pulse 72   Temp (!) 96.9 F (36.1 C) (Other (Comment))   Ht '5\' 6"'$  (1.676 m)   Wt 175 lb 12.8 oz (79.7 kg)   SpO2 99%   BMI 28.37 kg/m    General: Alert, no acute distress Cardio: Normal S1 and S2, RRR, no r/m/g, no JVD appreciated Pulm: CTAB, normal work of breathing Abdomen: Bowel sounds normal. Abdomen soft and non-tender.  Extremities: No peripheral edema.   ASSESSMENT/PLAN:   HFrEF (heart failure with reduced ejection fraction) (HCC) New diagnosis during recent hospitalization.  Evaluated by cardiology.  Left ejection fraction 20 to 25%.  Asymptomatic and euvolemic on exam.  Compliant with medication. -CMET today -Continue Lasix 40 mg daily -Continue Jardiance 25 mg daily -Continue metoprolol 50 mg daily -Continue  Aldactone 25 mg -Follow-up with cardiology as scheduled 08/17    Portal hypertensive gastropathy No signs of bleeding Follows with GI, scheduled for 09/22  Type 2 diabetes mellitus without complication, without long-term current use of insulin (HCC) Self discontinued [place] and metformin.Marland Kitchen  Recent A1c 6.2 -Repeat A1c today -Urine ACR -Recommend restarting metformin 500 mg daily -Continue Jardiance 25 mg daily -Recommend yearly eye and foot exam -Follow-up in 3 months.  HLD (hyperlipidemia) Reviewed recent LDL.  Goal less than 50.  Not currently at goal.  Not on statin. -Direct LDL today -Follow-up with labs. -Given history of myalgias secondary to statin use could consider initiation of Repatha at next visit.   Myalgia due to statin Previously documented trials of statin use causing myalgia.  Also with history of elevated liver enzymes secondary to autoimmune hepatitis. -Avoid statin therapy.    Vitamin D deficiency Currently on vitamin D supplements Vitamin D level today Continue vitamin D3 on 125 mcg daily   Low serum vitamin B12 Currently on vitamin B12 supplements Repeat vitamin B12 today Continue vitamin B12 100 mcg daily.   HCM -HIV declined -Eye, foot exam due -Pap up-to-date -Colonoscopy up-to-date -Recommend DEXA scan given continued Steroid use and postmenopausal, discuss at next visit    PDMP reviewed  Carollee Leitz, MD

## 2021-12-19 DIAGNOSIS — M549 Dorsalgia, unspecified: Secondary | ICD-10-CM | POA: Diagnosis not present

## 2021-12-19 DIAGNOSIS — E119 Type 2 diabetes mellitus without complications: Secondary | ICD-10-CM | POA: Diagnosis not present

## 2021-12-19 DIAGNOSIS — M1990 Unspecified osteoarthritis, unspecified site: Secondary | ICD-10-CM | POA: Diagnosis not present

## 2021-12-19 DIAGNOSIS — J45909 Unspecified asthma, uncomplicated: Secondary | ICD-10-CM | POA: Diagnosis not present

## 2021-12-19 DIAGNOSIS — I493 Ventricular premature depolarization: Secondary | ICD-10-CM | POA: Diagnosis not present

## 2021-12-19 DIAGNOSIS — K766 Portal hypertension: Secondary | ICD-10-CM | POA: Diagnosis not present

## 2021-12-19 DIAGNOSIS — K219 Gastro-esophageal reflux disease without esophagitis: Secondary | ICD-10-CM | POA: Diagnosis not present

## 2021-12-19 DIAGNOSIS — D539 Nutritional anemia, unspecified: Secondary | ICD-10-CM | POA: Diagnosis not present

## 2021-12-19 DIAGNOSIS — I480 Paroxysmal atrial fibrillation: Secondary | ICD-10-CM | POA: Diagnosis not present

## 2021-12-19 DIAGNOSIS — I110 Hypertensive heart disease with heart failure: Secondary | ICD-10-CM | POA: Diagnosis not present

## 2021-12-19 DIAGNOSIS — F17210 Nicotine dependence, cigarettes, uncomplicated: Secondary | ICD-10-CM | POA: Diagnosis not present

## 2021-12-19 DIAGNOSIS — I420 Dilated cardiomyopathy: Secondary | ICD-10-CM | POA: Diagnosis not present

## 2021-12-19 DIAGNOSIS — I447 Left bundle-branch block, unspecified: Secondary | ICD-10-CM | POA: Diagnosis not present

## 2021-12-19 DIAGNOSIS — Z48812 Encounter for surgical aftercare following surgery on the circulatory system: Secondary | ICD-10-CM | POA: Diagnosis not present

## 2021-12-19 DIAGNOSIS — R197 Diarrhea, unspecified: Secondary | ICD-10-CM | POA: Diagnosis not present

## 2021-12-19 DIAGNOSIS — I5021 Acute systolic (congestive) heart failure: Secondary | ICD-10-CM | POA: Diagnosis not present

## 2021-12-19 DIAGNOSIS — K7460 Unspecified cirrhosis of liver: Secondary | ICD-10-CM | POA: Diagnosis not present

## 2021-12-19 DIAGNOSIS — M4690 Unspecified inflammatory spondylopathy, site unspecified: Secondary | ICD-10-CM | POA: Diagnosis not present

## 2021-12-19 DIAGNOSIS — K754 Autoimmune hepatitis: Secondary | ICD-10-CM | POA: Diagnosis not present

## 2021-12-19 DIAGNOSIS — I272 Pulmonary hypertension, unspecified: Secondary | ICD-10-CM | POA: Diagnosis not present

## 2021-12-19 DIAGNOSIS — I081 Rheumatic disorders of both mitral and tricuspid valves: Secondary | ICD-10-CM | POA: Diagnosis not present

## 2021-12-19 DIAGNOSIS — M810 Age-related osteoporosis without current pathological fracture: Secondary | ICD-10-CM | POA: Diagnosis not present

## 2021-12-19 DIAGNOSIS — M329 Systemic lupus erythematosus, unspecified: Secondary | ICD-10-CM | POA: Diagnosis not present

## 2021-12-22 DIAGNOSIS — I493 Ventricular premature depolarization: Secondary | ICD-10-CM | POA: Diagnosis not present

## 2021-12-26 DIAGNOSIS — I5020 Unspecified systolic (congestive) heart failure: Secondary | ICD-10-CM | POA: Diagnosis not present

## 2021-12-26 DIAGNOSIS — I2510 Atherosclerotic heart disease of native coronary artery without angina pectoris: Secondary | ICD-10-CM | POA: Diagnosis not present

## 2021-12-26 DIAGNOSIS — G4733 Obstructive sleep apnea (adult) (pediatric): Secondary | ICD-10-CM | POA: Diagnosis not present

## 2021-12-26 DIAGNOSIS — I480 Paroxysmal atrial fibrillation: Secondary | ICD-10-CM | POA: Diagnosis not present

## 2021-12-27 ENCOUNTER — Encounter: Payer: Self-pay | Admitting: Family Medicine

## 2021-12-27 DIAGNOSIS — T466X5A Adverse effect of antihyperlipidemic and antiarteriosclerotic drugs, initial encounter: Secondary | ICD-10-CM | POA: Insufficient documentation

## 2021-12-27 DIAGNOSIS — I5020 Unspecified systolic (congestive) heart failure: Secondary | ICD-10-CM | POA: Insufficient documentation

## 2021-12-27 DIAGNOSIS — E538 Deficiency of other specified B group vitamins: Secondary | ICD-10-CM | POA: Insufficient documentation

## 2021-12-27 NOTE — Assessment & Plan Note (Signed)
Currently on vitamin D supplements Vitamin D level today Continue vitamin D3 on 125 mcg daily

## 2021-12-27 NOTE — Assessment & Plan Note (Addendum)
No signs of bleeding Follows with GI, scheduled for 09/22

## 2021-12-27 NOTE — Assessment & Plan Note (Addendum)
Reviewed recent LDL.  Goal less than 50.  Not currently at goal.  Not on statin. -Direct LDL today -Follow-up with labs. -Given history of myalgias secondary to statin use could consider initiation of Repatha at next visit.

## 2021-12-27 NOTE — Assessment & Plan Note (Signed)
Previously documented trials of statin use causing myalgia.  Also with history of elevated liver enzymes secondary to autoimmune hepatitis. -Avoid statin therapy.

## 2021-12-27 NOTE — Assessment & Plan Note (Signed)
Currently on vitamin B12 supplements Repeat vitamin B12 today Continue vitamin B12 100 mcg daily.

## 2021-12-27 NOTE — Assessment & Plan Note (Addendum)
New diagnosis during recent hospitalization.  Evaluated by cardiology.  Left ejection fraction 20 to 25%.  Asymptomatic and euvolemic on exam.  Compliant with medication. -CMET today -Continue Lasix 40 mg daily -Continue Jardiance 25 mg daily -Continue metoprolol 50 mg daily -Continue Aldactone 25 mg -Follow-up with cardiology as scheduled 08/17

## 2021-12-27 NOTE — Assessment & Plan Note (Addendum)
Self discontinued Shannon [name] and metformin.Marland Kitchen  Recent A1c 6.2 -Repeat A1c today -Urine ACR -Recommend restarting metformin 500 mg daily -Continue Jardiance 25 mg daily -Recommend yearly eye and foot exam -Follow-up in 3 months.

## 2021-12-30 ENCOUNTER — Encounter: Payer: Self-pay | Admitting: Internal Medicine

## 2021-12-30 DIAGNOSIS — I4891 Unspecified atrial fibrillation: Secondary | ICD-10-CM | POA: Insufficient documentation

## 2021-12-30 DIAGNOSIS — R001 Bradycardia, unspecified: Secondary | ICD-10-CM | POA: Insufficient documentation

## 2021-12-30 DIAGNOSIS — I420 Dilated cardiomyopathy: Secondary | ICD-10-CM | POA: Insufficient documentation

## 2022-01-02 DIAGNOSIS — I493 Ventricular premature depolarization: Secondary | ICD-10-CM | POA: Diagnosis not present

## 2022-01-03 DIAGNOSIS — K754 Autoimmune hepatitis: Secondary | ICD-10-CM | POA: Diagnosis not present

## 2022-01-03 DIAGNOSIS — E119 Type 2 diabetes mellitus without complications: Secondary | ICD-10-CM | POA: Diagnosis not present

## 2022-01-03 DIAGNOSIS — M810 Age-related osteoporosis without current pathological fracture: Secondary | ICD-10-CM | POA: Diagnosis not present

## 2022-01-03 DIAGNOSIS — I447 Left bundle-branch block, unspecified: Secondary | ICD-10-CM | POA: Diagnosis not present

## 2022-01-03 DIAGNOSIS — M329 Systemic lupus erythematosus, unspecified: Secondary | ICD-10-CM | POA: Diagnosis not present

## 2022-01-03 DIAGNOSIS — Z48812 Encounter for surgical aftercare following surgery on the circulatory system: Secondary | ICD-10-CM | POA: Diagnosis not present

## 2022-01-03 DIAGNOSIS — K7460 Unspecified cirrhosis of liver: Secondary | ICD-10-CM | POA: Diagnosis not present

## 2022-01-03 DIAGNOSIS — I420 Dilated cardiomyopathy: Secondary | ICD-10-CM | POA: Diagnosis not present

## 2022-01-03 DIAGNOSIS — D539 Nutritional anemia, unspecified: Secondary | ICD-10-CM | POA: Diagnosis not present

## 2022-01-03 DIAGNOSIS — J45909 Unspecified asthma, uncomplicated: Secondary | ICD-10-CM | POA: Diagnosis not present

## 2022-01-03 DIAGNOSIS — M1990 Unspecified osteoarthritis, unspecified site: Secondary | ICD-10-CM | POA: Diagnosis not present

## 2022-01-03 DIAGNOSIS — I5021 Acute systolic (congestive) heart failure: Secondary | ICD-10-CM | POA: Diagnosis not present

## 2022-01-03 DIAGNOSIS — M549 Dorsalgia, unspecified: Secondary | ICD-10-CM | POA: Diagnosis not present

## 2022-01-03 DIAGNOSIS — I110 Hypertensive heart disease with heart failure: Secondary | ICD-10-CM | POA: Diagnosis not present

## 2022-01-03 DIAGNOSIS — F17210 Nicotine dependence, cigarettes, uncomplicated: Secondary | ICD-10-CM | POA: Diagnosis not present

## 2022-01-03 DIAGNOSIS — K766 Portal hypertension: Secondary | ICD-10-CM | POA: Diagnosis not present

## 2022-01-03 DIAGNOSIS — I480 Paroxysmal atrial fibrillation: Secondary | ICD-10-CM | POA: Diagnosis not present

## 2022-01-03 DIAGNOSIS — I493 Ventricular premature depolarization: Secondary | ICD-10-CM | POA: Diagnosis not present

## 2022-01-03 DIAGNOSIS — R197 Diarrhea, unspecified: Secondary | ICD-10-CM | POA: Diagnosis not present

## 2022-01-03 DIAGNOSIS — I081 Rheumatic disorders of both mitral and tricuspid valves: Secondary | ICD-10-CM | POA: Diagnosis not present

## 2022-01-03 DIAGNOSIS — M4690 Unspecified inflammatory spondylopathy, site unspecified: Secondary | ICD-10-CM | POA: Diagnosis not present

## 2022-01-03 DIAGNOSIS — I272 Pulmonary hypertension, unspecified: Secondary | ICD-10-CM | POA: Diagnosis not present

## 2022-01-03 DIAGNOSIS — K219 Gastro-esophageal reflux disease without esophagitis: Secondary | ICD-10-CM | POA: Diagnosis not present

## 2022-01-06 ENCOUNTER — Other Ambulatory Visit: Payer: Self-pay | Admitting: Internal Medicine

## 2022-01-06 DIAGNOSIS — I472 Ventricular tachycardia, unspecified: Secondary | ICD-10-CM | POA: Diagnosis not present

## 2022-01-06 DIAGNOSIS — I471 Supraventricular tachycardia: Secondary | ICD-10-CM | POA: Diagnosis not present

## 2022-01-06 DIAGNOSIS — E119 Type 2 diabetes mellitus without complications: Secondary | ICD-10-CM

## 2022-01-07 ENCOUNTER — Telehealth: Payer: Self-pay | Admitting: Internal Medicine

## 2022-01-07 NOTE — Telephone Encounter (Signed)
Pt daughter came into office to drop off smoking cessation form. Placed in provider folder

## 2022-01-07 NOTE — Telephone Encounter (Signed)
Form filled out

## 2022-01-10 DIAGNOSIS — I447 Left bundle-branch block, unspecified: Secondary | ICD-10-CM | POA: Diagnosis not present

## 2022-01-10 DIAGNOSIS — M329 Systemic lupus erythematosus, unspecified: Secondary | ICD-10-CM | POA: Diagnosis not present

## 2022-01-10 DIAGNOSIS — M810 Age-related osteoporosis without current pathological fracture: Secondary | ICD-10-CM | POA: Diagnosis not present

## 2022-01-10 DIAGNOSIS — R197 Diarrhea, unspecified: Secondary | ICD-10-CM | POA: Diagnosis not present

## 2022-01-10 DIAGNOSIS — I480 Paroxysmal atrial fibrillation: Secondary | ICD-10-CM | POA: Diagnosis not present

## 2022-01-10 DIAGNOSIS — I272 Pulmonary hypertension, unspecified: Secondary | ICD-10-CM | POA: Diagnosis not present

## 2022-01-10 DIAGNOSIS — K766 Portal hypertension: Secondary | ICD-10-CM | POA: Diagnosis not present

## 2022-01-10 DIAGNOSIS — K754 Autoimmune hepatitis: Secondary | ICD-10-CM | POA: Diagnosis not present

## 2022-01-10 DIAGNOSIS — Z48812 Encounter for surgical aftercare following surgery on the circulatory system: Secondary | ICD-10-CM | POA: Diagnosis not present

## 2022-01-10 DIAGNOSIS — I110 Hypertensive heart disease with heart failure: Secondary | ICD-10-CM | POA: Diagnosis not present

## 2022-01-10 DIAGNOSIS — I5021 Acute systolic (congestive) heart failure: Secondary | ICD-10-CM | POA: Diagnosis not present

## 2022-01-10 DIAGNOSIS — I493 Ventricular premature depolarization: Secondary | ICD-10-CM | POA: Diagnosis not present

## 2022-01-10 DIAGNOSIS — J45909 Unspecified asthma, uncomplicated: Secondary | ICD-10-CM | POA: Diagnosis not present

## 2022-01-10 DIAGNOSIS — M1990 Unspecified osteoarthritis, unspecified site: Secondary | ICD-10-CM | POA: Diagnosis not present

## 2022-01-10 DIAGNOSIS — K219 Gastro-esophageal reflux disease without esophagitis: Secondary | ICD-10-CM | POA: Diagnosis not present

## 2022-01-10 DIAGNOSIS — F17210 Nicotine dependence, cigarettes, uncomplicated: Secondary | ICD-10-CM | POA: Diagnosis not present

## 2022-01-10 DIAGNOSIS — M4690 Unspecified inflammatory spondylopathy, site unspecified: Secondary | ICD-10-CM | POA: Diagnosis not present

## 2022-01-10 DIAGNOSIS — I420 Dilated cardiomyopathy: Secondary | ICD-10-CM | POA: Diagnosis not present

## 2022-01-10 DIAGNOSIS — D539 Nutritional anemia, unspecified: Secondary | ICD-10-CM | POA: Diagnosis not present

## 2022-01-10 DIAGNOSIS — K7460 Unspecified cirrhosis of liver: Secondary | ICD-10-CM | POA: Diagnosis not present

## 2022-01-10 DIAGNOSIS — I081 Rheumatic disorders of both mitral and tricuspid valves: Secondary | ICD-10-CM | POA: Diagnosis not present

## 2022-01-10 DIAGNOSIS — M549 Dorsalgia, unspecified: Secondary | ICD-10-CM | POA: Diagnosis not present

## 2022-01-10 DIAGNOSIS — E119 Type 2 diabetes mellitus without complications: Secondary | ICD-10-CM | POA: Diagnosis not present

## 2022-01-20 DIAGNOSIS — I5022 Chronic systolic (congestive) heart failure: Secondary | ICD-10-CM | POA: Diagnosis not present

## 2022-01-20 DIAGNOSIS — I4892 Unspecified atrial flutter: Secondary | ICD-10-CM | POA: Diagnosis not present

## 2022-01-20 DIAGNOSIS — I5020 Unspecified systolic (congestive) heart failure: Secondary | ICD-10-CM | POA: Diagnosis not present

## 2022-01-20 DIAGNOSIS — K30 Functional dyspepsia: Secondary | ICD-10-CM | POA: Diagnosis not present

## 2022-01-20 DIAGNOSIS — I420 Dilated cardiomyopathy: Secondary | ICD-10-CM | POA: Diagnosis not present

## 2022-01-20 DIAGNOSIS — G4730 Sleep apnea, unspecified: Secondary | ICD-10-CM | POA: Diagnosis not present

## 2022-01-20 DIAGNOSIS — F17210 Nicotine dependence, cigarettes, uncomplicated: Secondary | ICD-10-CM | POA: Diagnosis not present

## 2022-01-20 DIAGNOSIS — E119 Type 2 diabetes mellitus without complications: Secondary | ICD-10-CM | POA: Diagnosis not present

## 2022-01-20 DIAGNOSIS — I491 Atrial premature depolarization: Secondary | ICD-10-CM | POA: Diagnosis not present

## 2022-01-20 DIAGNOSIS — Z7984 Long term (current) use of oral hypoglycemic drugs: Secondary | ICD-10-CM | POA: Diagnosis not present

## 2022-01-20 DIAGNOSIS — D539 Nutritional anemia, unspecified: Secondary | ICD-10-CM | POA: Diagnosis not present

## 2022-01-20 DIAGNOSIS — K766 Portal hypertension: Secondary | ICD-10-CM | POA: Diagnosis not present

## 2022-01-20 DIAGNOSIS — K7469 Other cirrhosis of liver: Secondary | ICD-10-CM | POA: Diagnosis not present

## 2022-01-20 DIAGNOSIS — I4891 Unspecified atrial fibrillation: Secondary | ICD-10-CM | POA: Diagnosis not present

## 2022-01-20 DIAGNOSIS — I493 Ventricular premature depolarization: Secondary | ICD-10-CM | POA: Diagnosis not present

## 2022-01-20 DIAGNOSIS — I447 Left bundle-branch block, unspecified: Secondary | ICD-10-CM | POA: Diagnosis not present

## 2022-01-21 DIAGNOSIS — J939 Pneumothorax, unspecified: Secondary | ICD-10-CM | POA: Diagnosis not present

## 2022-01-21 DIAGNOSIS — K30 Functional dyspepsia: Secondary | ICD-10-CM | POA: Diagnosis not present

## 2022-01-21 DIAGNOSIS — I4891 Unspecified atrial fibrillation: Secondary | ICD-10-CM | POA: Diagnosis not present

## 2022-01-21 DIAGNOSIS — K766 Portal hypertension: Secondary | ICD-10-CM | POA: Diagnosis not present

## 2022-01-21 DIAGNOSIS — I4892 Unspecified atrial flutter: Secondary | ICD-10-CM | POA: Diagnosis not present

## 2022-01-21 DIAGNOSIS — E119 Type 2 diabetes mellitus without complications: Secondary | ICD-10-CM | POA: Diagnosis not present

## 2022-01-21 DIAGNOSIS — K7469 Other cirrhosis of liver: Secondary | ICD-10-CM | POA: Diagnosis not present

## 2022-01-21 DIAGNOSIS — I447 Left bundle-branch block, unspecified: Secondary | ICD-10-CM | POA: Diagnosis not present

## 2022-01-21 DIAGNOSIS — I420 Dilated cardiomyopathy: Secondary | ICD-10-CM | POA: Diagnosis not present

## 2022-01-21 DIAGNOSIS — I5022 Chronic systolic (congestive) heart failure: Secondary | ICD-10-CM | POA: Diagnosis not present

## 2022-01-21 DIAGNOSIS — F17210 Nicotine dependence, cigarettes, uncomplicated: Secondary | ICD-10-CM | POA: Diagnosis not present

## 2022-01-21 DIAGNOSIS — Z4502 Encounter for adjustment and management of automatic implantable cardiac defibrillator: Secondary | ICD-10-CM | POA: Diagnosis not present

## 2022-01-21 DIAGNOSIS — Z7984 Long term (current) use of oral hypoglycemic drugs: Secondary | ICD-10-CM | POA: Diagnosis not present

## 2022-01-21 DIAGNOSIS — D539 Nutritional anemia, unspecified: Secondary | ICD-10-CM | POA: Diagnosis not present

## 2022-01-21 DIAGNOSIS — G4730 Sleep apnea, unspecified: Secondary | ICD-10-CM | POA: Diagnosis not present

## 2022-01-23 DIAGNOSIS — R9431 Abnormal electrocardiogram [ECG] [EKG]: Secondary | ICD-10-CM | POA: Diagnosis not present

## 2022-01-29 DIAGNOSIS — Z4502 Encounter for adjustment and management of automatic implantable cardiac defibrillator: Secondary | ICD-10-CM | POA: Diagnosis not present

## 2022-02-06 DIAGNOSIS — I2510 Atherosclerotic heart disease of native coronary artery without angina pectoris: Secondary | ICD-10-CM | POA: Diagnosis not present

## 2022-02-06 DIAGNOSIS — K766 Portal hypertension: Secondary | ICD-10-CM | POA: Diagnosis not present

## 2022-02-06 DIAGNOSIS — I4891 Unspecified atrial fibrillation: Secondary | ICD-10-CM | POA: Diagnosis not present

## 2022-02-06 DIAGNOSIS — I5020 Unspecified systolic (congestive) heart failure: Secondary | ICD-10-CM | POA: Diagnosis not present

## 2022-02-06 DIAGNOSIS — G4733 Obstructive sleep apnea (adult) (pediatric): Secondary | ICD-10-CM | POA: Diagnosis not present

## 2022-02-14 DIAGNOSIS — Z79899 Other long term (current) drug therapy: Secondary | ICD-10-CM | POA: Diagnosis not present

## 2022-02-14 LAB — HM DIABETES EYE EXAM

## 2022-02-17 DIAGNOSIS — M7552 Bursitis of left shoulder: Secondary | ICD-10-CM | POA: Diagnosis not present

## 2022-02-18 ENCOUNTER — Encounter: Payer: Self-pay | Admitting: Internal Medicine

## 2022-02-27 DIAGNOSIS — Z4502 Encounter for adjustment and management of automatic implantable cardiac defibrillator: Secondary | ICD-10-CM | POA: Diagnosis not present

## 2022-03-04 ENCOUNTER — Encounter: Payer: Self-pay | Admitting: *Deleted

## 2022-03-04 ENCOUNTER — Encounter: Payer: Medicare Other | Attending: Family Medicine | Admitting: *Deleted

## 2022-03-04 DIAGNOSIS — I5022 Chronic systolic (congestive) heart failure: Secondary | ICD-10-CM

## 2022-03-04 NOTE — Progress Notes (Signed)
Virtual orientation call completed today. shehas an appointment on Date: 03/12/2022  for EP eval and gym Orientation.  Documentation of diagnosis can be found in [facility] Date: 11/05/2021 .   Shannon [name] is a current tobacco user. Intervention for tobacco cessation was provided at the initial medical review. She was asked about readiness to quit and reported that she has not set a quit date. She has weaned down to under a pack a day and is working towards a quit.   . Patient was advised and educated about tobacco cessation using combination therapy, tobacco cessation classes, quit line, and quit smoking apps. Patient demonstrated understanding of this material. Staff will continue to provide encouragement and follow up with the patient throughout the program.  She has already reached out to the [facility] Quitline and received patches. She has used some patches.

## 2022-03-06 DIAGNOSIS — Z23 Encounter for immunization: Secondary | ICD-10-CM | POA: Diagnosis not present

## 2022-03-06 DIAGNOSIS — K7682 Hepatic encephalopathy: Secondary | ICD-10-CM | POA: Diagnosis not present

## 2022-03-06 DIAGNOSIS — I8510 Secondary esophageal varices without bleeding: Secondary | ICD-10-CM | POA: Diagnosis not present

## 2022-03-06 DIAGNOSIS — K766 Portal hypertension: Secondary | ICD-10-CM | POA: Diagnosis not present

## 2022-03-06 DIAGNOSIS — K7460 Unspecified cirrhosis of liver: Secondary | ICD-10-CM | POA: Diagnosis not present

## 2022-03-06 DIAGNOSIS — K754 Autoimmune hepatitis: Secondary | ICD-10-CM | POA: Diagnosis not present

## 2022-03-12 ENCOUNTER — Encounter: Payer: Medicare Other | Attending: Family Medicine | Admitting: *Deleted

## 2022-03-12 VITALS — Ht 66.0 in | Wt 173.3 lb

## 2022-03-12 DIAGNOSIS — I5022 Chronic systolic (congestive) heart failure: Secondary | ICD-10-CM | POA: Insufficient documentation

## 2022-03-12 NOTE — Patient Instructions (Signed)
Patient Instructions  Patient Details  Name: Shannon [name] MRN: [id number] Date of Birth: [birth year] Referring Provider:  Marjean Donna, MD  Below are your personal goals for exercise, nutrition, and risk factors. Our goal is to help you stay on track towards obtaining and maintaining these goals. We will be discussing your progress on these goals with you throughout the program.  Initial Exercise Prescription:  Initial Exercise Prescription - 03/12/22 1500       Date of Initial Exercise RX and Referring Provider   Date 03/12/22    Referring Provider C. Deen      Oxygen   Maintain Oxygen Saturation 88% or higher      Treadmill   MPH 2.2    Grade 1    Minutes 15    METs 2.99      NuStep   Level 2    SPM 80    Minutes 15    METs 3.03      REL-XR   Level 1    Speed 50    Minutes 5    METs 3.03      Biostep-RELP   Level 1    SPM 50    Minutes 15    METs 3.03      Prescription Details   Frequency (times per week) 2    Duration Progress to 30 minutes of continuous aerobic without signs/symptoms of physical distress      Intensity   THRR 40-80% of Max Heartrate 105-139    Ratings of Perceived Exertion 11-13    Perceived Dyspnea 0-4      Progression   Progression Continue to progress workloads to maintain intensity without signs/symptoms of physical distress.      Resistance Training   Training Prescription Yes    Weight 2    Reps 10-15             Exercise Goals: Frequency: Be able to perform aerobic exercise two to three times per week in program working toward 2-5 days per week of home exercise.  Intensity: Work with a perceived exertion of 11 (fairly light) - 15 (hard) while following your exercise prescription.  We will make changes to your prescription with you as you progress through the program.   Duration: Be able to do 30 to 45 minutes of continuous aerobic exercise in addition to a 5 minute warm-up and a 5 minute cool-down routine.    Nutrition Goals: Your personal nutrition goals will be established when you do your nutrition analysis with the dietician.  The following are general nutrition guidelines to follow: Cholesterol < '200mg'$ /day Sodium < '1500mg'$ /day Fiber: Women over 50 yrs - 21 grams per day  Personal Goals:  Personal Goals and Risk Factors at Admission - 03/12/22 1547       Core Components/Risk Factors/Patient Goals on Admission    Weight Management Yes    Intervention Weight Management: Provide education and appropriate resources to help participant work on and attain dietary goals.;Weight Management: Develop a combined nutrition and exercise program designed to reach desired caloric intake, while maintaining appropriate intake of nutrient and fiber, sodium and fats, and appropriate energy expenditure required for the weight goal.    Admit Weight 173 lb 4.8 oz (78.6 kg)    Goal Weight: Short Term 165 lb (74.8 kg)    Goal Weight: Long Term 155 lb (70.3 kg)    Expected Outcomes Short Term: Continue to assess and modify interventions until short term weight is achieved;Long  Term: Adherence to nutrition and physical activity/exercise program aimed toward attainment of established weight goal;Weight Loss: Understanding of general recommendations for a balanced deficit meal plan, which promotes 1-2 lb weight loss per week and includes a negative energy balance of [phone] kcal/d    Tobacco Cessation Yes    Number of packs per day 1/2 pack  a day   Pegah is a current tobacco user. Intervention for tobacco cessation was provided at the initial medical review. She was asked about readiness to quit and reported that she has not set a quit date. She has weaned down to under a pack a day and is working towards a quit.   . Patient was advised and educated about tobacco cessation using combination therapy, tobacco cessation classes, quit line, and quit smoking apps. Patient demonstrated understanding of this material. Staff  will continue to provide encouragement and follow up with the patient throughout the program.  She has already reached out to the [facility] Quitline and received patches. She has used some patches.    Intervention Assist the participant in steps to quit. Provide individualized education and counseling about committing to Tobacco Cessation, relapse prevention, and pharmacological support that can be provided by physician.;Advice worker, assist with locating and accessing local/national Quit Smoking programs, and support quit date choice.    Expected Outcomes Short Term: Will demonstrate readiness to quit, by selecting a quit date.;Short Term: Will quit all tobacco product use, adhering to prevention of relapse plan.;Long Term: Complete abstinence from all tobacco products for at least 12 months from quit date.    Diabetes Yes    Intervention Provide education about signs/symptoms and action to take for hypo/hyperglycemia.;Provide education about proper nutrition, including hydration, and aerobic/resistive exercise prescription along with prescribed medications to achieve blood glucose in normal ranges: Fasting glucose 65-99 mg/dL    Expected Outcomes Short Term: Participant verbalizes understanding of the signs/symptoms and immediate care of hyper/hypoglycemia, proper foot care and importance of medication, aerobic/resistive exercise and nutrition plan for blood glucose control.;Long Term: Attainment of HbA1C < 7%.    Heart Failure Yes    Intervention Provide a combined exercise and nutrition program that is supplemented with education, support and counseling about heart failure. Directed toward relieving symptoms such as shortness of breath, decreased exercise tolerance, and extremity edema.    Expected Outcomes Improve functional capacity of life;Short term: Attendance in program 2-3 days a week with increased exercise capacity. Reported lower sodium intake. Reported increased fruit and vegetable  intake. Reports medication compliance.;Short term: Daily weights obtained and reported for increase. Utilizing diuretic protocols set by physician.;Long term: Adoption of self-care skills and reduction of barriers for early signs and symptoms recognition and intervention leading to self-care maintenance.             Tobacco Use Initial Evaluation: Social History   Tobacco Use  Smoking Status Every Day   Packs/day: 0.50   Years: 30.00   Total pack years: 15.00   Types: Cigarettes  Smokeless Tobacco Never  Tobacco Comments   Aware of need to quit  has decreased number of cigarettes per day. Has reached out to [facility] Quitline and has patches. No quit date yet.     Exercise Goals and Review:  Exercise Goals     Row Name 03/12/22 1540             Exercise Goals   Increase Physical Activity Yes       Intervention Develop an individualized exercise prescription for  aerobic and resistive training based on initial evaluation findings, risk stratification, comorbidities and participant's personal goals.;Provide advice, education, support and counseling about physical activity/exercise needs.       Expected Outcomes Short Term: Attend rehab on a regular basis to increase amount of physical activity.;Long Term: Add in home exercise to make exercise part of routine and to increase amount of physical activity.;Long Term: Exercising regularly at least 3-5 days a week.       Increase Strength and Stamina Yes       Intervention Provide advice, education, support and counseling about physical activity/exercise needs.;Develop an individualized exercise prescription for aerobic and resistive training based on initial evaluation findings, risk stratification, comorbidities and participant's personal goals.       Expected Outcomes Short Term: Increase workloads from initial exercise prescription for resistance, speed, and METs.;Short Term: Perform resistance training exercises routinely during rehab and  add in resistance training at home;Long Term: Improve cardiorespiratory fitness, muscular endurance and strength as measured by increased METs and functional capacity (6MWT)       Able to understand and use rate of perceived exertion (RPE) scale Yes       Intervention Provide education and explanation on how to use RPE scale       Expected Outcomes Short Term: Able to use RPE daily in rehab to express subjective intensity level;Long Term:  Able to use RPE to guide intensity level when exercising independently       Able to understand and use Dyspnea scale Yes       Intervention Provide education and explanation on how to use Dyspnea scale       Expected Outcomes Short Term: Able to use Dyspnea scale daily in rehab to express subjective sense of shortness of breath during exertion;Long Term: Able to use Dyspnea scale to guide intensity level when exercising independently       Knowledge and understanding of Target Heart Rate Range (THRR) Yes       Intervention Provide education and explanation of THRR including how the numbers were predicted and where they are located for reference       Expected Outcomes Short Term: Able to state/look up THRR;Short Term: Able to use daily as guideline for intensity in rehab;Long Term: Able to use THRR to govern intensity when exercising independently       Able to check pulse independently Yes       Intervention Provide education and demonstration on how to check pulse in carotid and radial arteries.;Review the importance of being able to check your own pulse for safety during independent exercise       Expected Outcomes Short Term: Able to explain why pulse checking is important during independent exercise;Long Term: Able to check pulse independently and accurately       Understanding of Exercise Prescription Yes       Intervention Provide education, explanation, and written materials on patient's individual exercise prescription       Expected Outcomes Short Term:  Able to explain program exercise prescription;Long Term: Able to explain home exercise prescription to exercise independently       Improve claudication pain toleration; Improve walking ability Yes       Intervention Participate in PAD/SET Rehab 2-3 days a week, walking at home as part of exercise prescription;Attend education sessions to aid in risk factor modification and understanding of disease process       Expected Outcomes Short Term: Improve walking distance/time to onset of claudication pain;Long Term:  Improve score of PAD questionnaires                Copy of goals given to participant.

## 2022-03-12 NOTE — Progress Notes (Signed)
Cardiac Individual Treatment Plan  Patient Details  Name: Shannon [name] MRN: [id number] Date of Birth: [birth year] Referring Provider:   Flowsheet Row Cardiac Rehab from 03/12/2022 in [facility] Cardiac and Pulmonary Rehab  Referring Provider C. Deen       Initial Encounter Date:  Flowsheet Row Cardiac Rehab from 03/12/2022 in [facility] Cardiac and Pulmonary Rehab  Date 03/12/22       Visit Diagnosis: Heart failure, chronic systolic (HCC)  Patient's Home Medications on Admission:  Current Outpatient Medications:    acetaminophen (TYLENOL) 650 MG CR tablet, Take by mouth., Disp: , Rfl:    albuterol (VENTOLIN HFA) 108 (90 Base) MCG/ACT inhaler, USE 1 TO 2 INHALATIONS BY  MOUTH INTO THE LUNGS EVERY  6 HOURS AS NEEDED FOR  WHEEZING OR SHORTNESS OF  BREATH, Disp: 34 g, Rfl: 2   apixaban (ELIQUIS) 5 MG TABS tablet, Take 1 tablet (5 mg total) by mouth 2 (two) times daily., Disp: , Rfl: 0   azaTHIOprine (IMURAN) 50 MG tablet, Take 4 tablets (200 mg total) by mouth daily., Disp: , Rfl:    blood glucose meter kit and supplies KIT, Dispense based on patient and insurance preference. Use up to four times daily as directed. (FOR ICD-9 250.00, 250.01)., Disp: 1 each, Rfl: 0   blood glucose meter kit and supplies, Dispense based on patient and insurance preference. Use up to four times daily as directed. (FOR ICD-10 E10.9, E11.9)., Disp: 1 each, Rfl: 0   Blood Glucose Monitoring Suppl (ONE TOUCH ULTRA SYSTEM KIT) W/DEVICE KIT, Test blood sugar as directed Dx E09.9, Disp: 1 each, Rfl: 0   celecoxib (CELEBREX) 100 MG capsule, Take 100 mg by mouth daily., Disp: , Rfl:    Cholecalciferol (VITAMIN D3) 5000 units TABS, Take by mouth. , Disp: , Rfl:    empagliflozin (JARDIANCE) 25 MG TABS tablet, Take 1 tablet (25 mg total) by mouth daily before breakfast., Disp: , Rfl:    furosemide (LASIX) 40 MG tablet, Take 1 tablet (40 mg total) by mouth daily., Disp: , Rfl: 0   glucose blood (ONETOUCH VERIO) test strip, 1  each by Other route 4 (four) times daily. Use as instructed, Disp: 400 strip, Rfl: 3   insulin degludec (TRESIBA FLEXTOUCH) 100 UNIT/ML FlexTouch Pen, Inject 10 Units into the skin daily. (Patient not taking: Reported on 03/04/2022), Disp: 15 mL, Rfl: 11   Insulin Pen Needle (PEN NEEDLES) 31G X 6 MM MISC, Use with Tyler Aas as instructed daily (Patient not taking: Reported on 03/04/2022), Disp: 100 each, Rfl: 11   lactulose (CHRONULAC) 10 GM/15ML solution, TAKE 30 ML BY MOUTH 4 TIMES DAILY AS NEEDED (Patient taking differently: Take 30 g by mouth. 50-60 g 1x per day not 30 g qid), Disp: 10800 mL, Rfl: 3   lidocaine (LIDODERM) 5 %, Place 1 patch onto the skin daily. Remove & Discard patch within 12 hours or as directed by MD, Disp: 30 patch, Rfl: 5   loratadine (CLARITIN) 10 MG tablet, Take 10 mg by mouth every other day as needed for allergies., Disp: , Rfl:    losartan (COZAAR) 25 MG tablet, Take 1 tablet by mouth daily., Disp: , Rfl:    Magnesium Oxide 500 MG TABS, Take by mouth., Disp: , Rfl:    metFORMIN (GLUCOPHAGE-XR) 500 MG 24 hr tablet, TAKE 2 TABLETS BY MOUTH TWICE  DAILY WITH A MEAL, Disp: 400 tablet, Rfl: 2   metoprolol tartrate (LOPRESSOR) 50 MG tablet, Take 1 tablet (50 mg total) by mouth  2 (two) times daily., Disp: , Rfl: 0   miconazole (MICONAZOLE 3) 200 MG vaginal suppository, Place 1 suppository (200 mg total) vaginally at bedtime., Disp: 3 suppository, Rfl: 0   nystatin (MYCOSTATIN) 100000 UNIT/ML suspension, Take 5 mLs (500,000 Units total) by mouth 4 (four) times daily. Hold and spit (Patient not taking: Reported on 03/04/2022), Disp: 473 mL, Rfl: 0   omeprazole (PRILOSEC) 40 MG capsule, TAKE 1 CAPSULE BY MOUTH  DAILY 1/2 HOUR BEFORE A  MEAL, Disp: 90 capsule, Rfl: 3   predniSONE (DELTASONE) 1 MG tablet, Take 3 mg by mouth daily., Disp: , Rfl:    Simethicone 180 MG CAPS, Take by mouth., Disp: , Rfl:    spironolactone (ALDACTONE) 25 MG tablet, Take 1 tablet (25 mg total) by mouth  daily., Disp: , Rfl: 0   traMADol (ULTRAM) 50 MG tablet, Take by mouth., Disp: , Rfl:    traZODone (DESYREL) 50 MG tablet, Take 25 mg by mouth at bedtime as needed., Disp: , Rfl:    vitamin B-12 (CYANOCOBALAMIN) 100 MCG tablet, Take 100 mcg by mouth daily., Disp: , Rfl:   Past Medical History: Past Medical History:  Diagnosis Date   Abdominal bloating 08/16/2013   Adjustment disorder 11/04/2010   Asthma    Atrial fibrillation with RVR ([place])    as of 11/2021   Autoimmune hepatitis ([place])    Bradycardia    Depression    Diabetes mellitus without complication ([place])    Dilated cardiomyopathy ([place])    DUB (dysfunctional uterine bleeding)    2008 s/p hysterectomy   Elevated liver enzymes 04/11/2020   Exposure to TB 2005   tx'ed x 6 months   Fatigue 01/23/2015   Gallstones    Gastric ulcer    GERD (gastroesophageal reflux disease)    HFrEF (heart failure with reduced ejection fraction) ([place])    unc cards as of 11/2021   History of asthma 02/26/2018   History of chicken pox    Hx of migraines    Leukopenia 04/11/2020   Liver disease    very low liver function, auto-immune hepatitis   Motion sickness    Osteoarthritis    Systemic lupus erythematosus ([place]) 05/31/2010   Qualifier: Diagnosis of  By: Deborra Medina MD, Talia     Vitamin D deficiency     Tobacco Use: Social History   Tobacco Use  Smoking Status Every Day   Packs/day: 0.50   Years: 30.00   Total pack years: 15.00   Types: Cigarettes  Smokeless Tobacco Never  Tobacco Comments   Aware of need to quit  has decreased number of cigarettes per day. Has reached out to [facility] Quitline and has patches. No quit date yet.     Labs: Review Flowsheet  More data exists      Latest Ref Rng & Units 04/28/2019 07/25/2020 11/27/2020 03/20/2021 12/18/2021  Labs for ITP Cardiac and Pulmonary Rehab  Cholestrol 100 - 199 mg/dL 193  131  123  159  -  LDL (calc) 0 - 99 mg/dL 114  65  60  78  -  Direct LDL mg/dL - - - - 60.0   HDL-C >39 mg/dL 67   56  48.30  69  -  Trlycerides 0 - 149 mg/dL 66  39  73.0  58  -  Hemoglobin A1c 4.6 - 6.5 % 6.5  6.1  - 6.2  6.8      Exercise Target Goals: Exercise Program Goal: Individual exercise prescription set using results  from initial 6 min walk test and THRR while considering  patient's activity barriers and safety.   Exercise Prescription Goal: Initial exercise prescription builds to 30-45 minutes a day of aerobic activity, 2-3 days per week.  Home exercise guidelines will be given to patient during program as part of exercise prescription that the participant will acknowledge.   Education: Aerobic Exercise: - Group verbal and visual presentation on the components of exercise prescription. Introduces F.I.T.T principle from ACSM for exercise prescriptions.  Reviews F.I.T.T. principles of aerobic exercise including progression. Written material given at graduation.   Education: Resistance Exercise: - Group verbal and visual presentation on the components of exercise prescription. Introduces F.I.T.T principle from ACSM for exercise prescriptions  Reviews F.I.T.T. principles of resistance exercise including progression. Written material given at graduation.    Education: Exercise & Equipment Safety: - Individual verbal instruction and demonstration of equipment use and safety with use of the equipment. Flowsheet Row Cardiac Rehab from 03/12/2022 in [facility] Cardiac and Pulmonary Rehab  Date 03/12/22  Educator [facility]  Instruction Review Code 1- Verbalizes Understanding       Education: Exercise Physiology & General Exercise Guidelines: - Group verbal and written instruction with models to review the exercise physiology of the cardiovascular system and associated critical values. Provides general exercise guidelines with specific guidelines to those with heart or lung disease.    Education: Flexibility, Balance, Mind/Body Relaxation: - Group verbal and visual presentation with interactive activity  on the components of exercise prescription. Introduces F.I.T.T principle from ACSM for exercise prescriptions. Reviews F.I.T.T. principles of flexibility and balance exercise training including progression. Also discusses the mind body connection.  Reviews various relaxation techniques to help reduce and manage stress (i.e. Deep breathing, progressive muscle relaxation, and visualization). Balance handout provided to take home. Written material given at graduation.   Activity Barriers & Risk Stratification:  Activity Barriers & Cardiac Risk Stratification - 03/04/22 1036       Activity Barriers & Cardiac Risk Stratification   Activity Barriers Back Problems   chronic pain, knows her limits   Cardiac Risk Stratification High             6 Minute Walk:  6 Minute Walk     Row Name 03/12/22 1530         6 Minute Walk   Phase Initial     Distance 1265 feet     Walk Time 6 minutes     # of Rest Breaks 0     MPH 2.4     METS 3.03     RPE 11     Perceived Dyspnea  1     VO2 Peak 10.61     Symptoms Yes (comment)     Comments left leg joint pain during test 5/10 pain     Resting HR 71 bpm     Resting BP 110/64     Resting Oxygen Saturation  98 %     Exercise Oxygen Saturation  during 6 min walk 98 %     Max Ex. HR 90 bpm     Max Ex. BP 124/72     2 Minute Post BP 114/70              Oxygen Initial Assessment:   Oxygen Re-Evaluation:   Oxygen Discharge (Final Oxygen Re-Evaluation):   Initial Exercise Prescription:  Initial Exercise Prescription - 03/12/22 1500       Date of Initial Exercise RX and Referring Provider   Date  03/12/22    Referring Provider C. Deen      Oxygen   Maintain Oxygen Saturation 88% or higher      Treadmill   MPH 2.2    Grade 1    Minutes 15    METs 2.99      NuStep   Level 2    SPM 80    Minutes 15    METs 3.03      REL-XR   Level 1    Speed 50    Minutes 5    METs 3.03      Biostep-RELP   Level 1    SPM 50     Minutes 15    METs 3.03      Prescription Details   Frequency (times per week) 2    Duration Progress to 30 minutes of continuous aerobic without signs/symptoms of physical distress      Intensity   THRR 40-80% of Max Heartrate 105-139    Ratings of Perceived Exertion 11-13    Perceived Dyspnea 0-4      Progression   Progression Continue to progress workloads to maintain intensity without signs/symptoms of physical distress.      Resistance Training   Training Prescription Yes    Weight 2    Reps 10-15             Perform Capillary Blood Glucose checks as needed.  Exercise Prescription Changes:   Exercise Prescription Changes     Row Name 03/12/22 1500             Response to Exercise   Blood Pressure (Admit) 110/64       Blood Pressure (Exercise) 124/72       Blood Pressure (Exit) 114/70       Heart Rate (Admit) 71 bpm       Heart Rate (Exercise) 90 bpm       Heart Rate (Exit) 69 bpm       Oxygen Saturation (Admit) 98 %       Oxygen Saturation (Exercise) 98 %       Oxygen Saturation (Exit) 100 %       Rating of Perceived Exertion (Exercise) 11       Perceived Dyspnea (Exercise) 1       Symptoms left leg joint pain 5/10       Comments 6 MWT results                Exercise Comments:   Exercise Goals and Review:   Exercise Goals     Row Name 03/12/22 1540             Exercise Goals   Increase Physical Activity Yes       Intervention Develop an individualized exercise prescription for aerobic and resistive training based on initial evaluation findings, risk stratification, comorbidities and participant's personal goals.;Provide advice, education, support and counseling about physical activity/exercise needs.       Expected Outcomes Short Term: Attend rehab on a regular basis to increase amount of physical activity.;Long Term: Add in home exercise to make exercise part of routine and to increase amount of physical activity.;Long Term: Exercising  regularly at least 3-5 days a week.       Increase Strength and Stamina Yes       Intervention Provide advice, education, support and counseling about physical activity/exercise needs.;Develop an individualized exercise prescription for aerobic and resistive training based on initial evaluation findings, risk stratification, comorbidities and  participant's personal goals.       Expected Outcomes Short Term: Increase workloads from initial exercise prescription for resistance, speed, and METs.;Short Term: Perform resistance training exercises routinely during rehab and add in resistance training at home;Long Term: Improve cardiorespiratory fitness, muscular endurance and strength as measured by increased METs and functional capacity (6MWT)       Able to understand and use rate of perceived exertion (RPE) scale Yes       Intervention Provide education and explanation on how to use RPE scale       Expected Outcomes Short Term: Able to use RPE daily in rehab to express subjective intensity level;Long Term:  Able to use RPE to guide intensity level when exercising independently       Able to understand and use Dyspnea scale Yes       Intervention Provide education and explanation on how to use Dyspnea scale       Expected Outcomes Short Term: Able to use Dyspnea scale daily in rehab to express subjective sense of shortness of breath during exertion;Long Term: Able to use Dyspnea scale to guide intensity level when exercising independently       Knowledge and understanding of Target Heart Rate Range (THRR) Yes       Intervention Provide education and explanation of THRR including how the numbers were predicted and where they are located for reference       Expected Outcomes Short Term: Able to state/look up THRR;Short Term: Able to use daily as guideline for intensity in rehab;Long Term: Able to use THRR to govern intensity when exercising independently       Able to check pulse independently Yes        Intervention Provide education and demonstration on how to check pulse in carotid and radial arteries.;Review the importance of being able to check your own pulse for safety during independent exercise       Expected Outcomes Short Term: Able to explain why pulse checking is important during independent exercise;Long Term: Able to check pulse independently and accurately       Understanding of Exercise Prescription Yes       Intervention Provide education, explanation, and written materials on patient's individual exercise prescription       Expected Outcomes Short Term: Able to explain program exercise prescription;Long Term: Able to explain home exercise prescription to exercise independently       Improve claudication pain toleration; Improve walking ability Yes       Intervention Participate in PAD/SET Rehab 2-3 days a week, walking at home as part of exercise prescription;Attend education sessions to aid in risk factor modification and understanding of disease process       Expected Outcomes Short Term: Improve walking distance/time to onset of claudication pain;Long Term: Improve score of PAD questionnaires                Exercise Goals Re-Evaluation :   Discharge Exercise Prescription (Final Exercise Prescription Changes):  Exercise Prescription Changes - 03/12/22 1500       Response to Exercise   Blood Pressure (Admit) 110/64    Blood Pressure (Exercise) 124/72    Blood Pressure (Exit) 114/70    Heart Rate (Admit) 71 bpm    Heart Rate (Exercise) 90 bpm    Heart Rate (Exit) 69 bpm    Oxygen Saturation (Admit) 98 %    Oxygen Saturation (Exercise) 98 %    Oxygen Saturation (Exit) 100 %    Rating  of Perceived Exertion (Exercise) 11    Perceived Dyspnea (Exercise) 1    Symptoms left leg joint pain 5/10    Comments 6 MWT results             Nutrition:  Target Goals: Understanding of nutrition guidelines, daily intake of sodium <1588m, cholesterol <2033m calories 30%  from fat and 7% or less from saturated fats, daily to have 5 or more servings of fruits and vegetables.  Education: All About Nutrition: -Group instruction provided by verbal, written material, interactive activities, discussions, models, and posters to present general guidelines for heart healthy nutrition including fat, fiber, MyPlate, the role of sodium in heart healthy nutrition, utilization of the nutrition label, and utilization of this knowledge for meal planning. Follow up email sent as well. Written material given at graduation.   Biometrics:  Pre Biometrics - 03/12/22 1542       Pre Biometrics   Height _0  (1.676 m)    Weight 173 lb 4.8 oz (78.6 kg)    Waist Circumference 39.5 inches    Hip Circumference 43 inches    Waist to Hip Ratio 0.92 %    BMI (Calculated) 27.98    Single Leg Stand 30 seconds              Nutrition Therapy Plan and Nutrition Goals:  Nutrition Therapy & Goals - 03/12/22 1547       Intervention Plan   Intervention Prescribe, educate and counsel regarding individualized specific dietary modifications aiming towards targeted core components such as weight, hypertension, lipid management, diabetes, heart failure and other comorbidities.    Expected Outcomes Short Term Goal: Understand basic principles of dietary content, such as calories, fat, sodium, cholesterol and nutrients.;Short Term Goal: A plan has been developed with personal nutrition goals set during dietitian appointment.;Long Term Goal: Adherence to prescribed nutrition plan.             Nutrition Assessments:  MEDIFICTS Score Key: ?70 Need to make dietary changes  40-70 Heart Healthy Diet ? 40 Therapeutic Level Cholesterol Diet   Picture Your Plate Scores: <4<62nhealthy dietary pattern with much room for improvement. 41-50 Dietary pattern unlikely to meet recommendations for good health and room for improvement. 51-60 More healthful dietary pattern, with some room for  improvement.  >60 Healthy dietary pattern, although there may be some specific behaviors that could be improved.    Nutrition Goals Re-Evaluation:   Nutrition Goals Discharge (Final Nutrition Goals Re-Evaluation):   Psychosocial: Target Goals: Acknowledge presence or absence of significant depression and/or stress, maximize coping skills, provide positive support system. Participant is able to verbalize types and ability to use techniques and skills needed for reducing stress and depression.   Education: Stress, Anxiety, and Depression - Group verbal and visual presentation to define topics covered.  Reviews how body is impacted by stress, anxiety, and depression.  Also discusses healthy ways to reduce stress and to treat/manage anxiety and depression.  Written material given at graduation.   Education: Sleep Hygiene -Provides group verbal and written instruction about how sleep can affect your health.  Define sleep hygiene, discuss sleep cycles and impact of sleep habits. Review good sleep hygiene tips.    Initial Review & Psychosocial Screening:  Initial Psych Review & Screening - 03/04/22 1039       Initial Review   Current issues with Current Stress Concerns    Source of Stress Concerns Chronic Illness    Comments new diagnosis, new ICD/Pacer  still  working on accepting the stress  keeping positive.      Family Dynamics   Good Support System? Yes   9 siblings, 3 children  and grandchildren.     Barriers   Psychosocial barriers to participate in program There are no identifiable barriers or psychosocial needs.      Screening Interventions   Interventions Encouraged to exercise;To provide support and resources with identified psychosocial needs;Provide feedback about the scores to participant    Expected Outcomes Short Term goal: Utilizing psychosocial counselor, staff and physician to assist with identification of specific Stressors or current issues interfering with healing  process. Setting desired goal for each stressor or current issue identified.;Long Term Goal: Stressors or current issues are controlled or eliminated.;Short Term goal: Identification and review with participant of any Quality of Life or Depression concerns found by scoring the questionnaire.;Long Term goal: The participant improves quality of Life and PHQ9 Scores as seen by post scores and/or verbalization of changes             Quality of Life Scores:   Quality of Life - 03/12/22 1545       Quality of Life   Select Quality of Life      Quality of Life Scores   Health/Function Pre 14.87 %    Socioeconomic Pre 18.5 %    Psych/Spiritual Pre 15.71 %    Family Pre 15.6 %    GLOBAL Pre 15.97 %            Scores of 19 and below usually indicate a poorer quality of life in these areas.  A difference of  2-3 points is a clinically meaningful difference.  A difference of 2-3 points in the total score of the Quality of Life Index has been associated with significant improvement in overall quality of life, self-image, physical symptoms, and general health in studies assessing change in quality of life.  PHQ-9: Review Flowsheet  More data exists      03/12/2022 09/04/2021 03/19/2021 09/03/2020 09/01/2019  Depression screen PHQ 2/9  Decreased Interest 2 0 3 0 0  Down, Depressed, Hopeless 2 0 2 0 0  PHQ - 2 Score 4 0 5 0 0  Altered sleeping 1 - - - -  Tired, decreased energy 1 - - - -  Change in appetite 1 - - - -  Feeling bad or failure about yourself  0 - - - -  Trouble concentrating 0 - - - -  Moving slowly or fidgety/restless 0 - - - -  Suicidal thoughts 0 - - - -  PHQ-9 Score 7 - - - -  Difficult doing work/chores Not difficult at all - - - -   Interpretation of Total Score  Total Score Depression Severity:  1-4 = Minimal depression, 5-9 = Mild depression, 10-14 = Moderate depression, 15-19 = Moderately severe depression, 20-27 = Severe depression   Psychosocial Evaluation and  Intervention:  Psychosocial Evaluation - 03/04/22 1101       Psychosocial Evaluation & Interventions   Interventions Encouraged to exercise with the program and follow exercise prescription    Comments Shannon [name] has been dealing with multiple health issues and then new diagnosis of Heart Failure. She has had recent implantation of an ICD/Pacemaker. THis has all become overwhelming to her at times. She states she does her best to maintain a positive attitude and continue to manage her day to day life. She has a large support team with 9 siblings, 3 children and  her grandchildren. SHe is aware that if she starts feeling depression that she cannot clear, that she will reach out to her physician for meds if needed.  She stated that there are days just talking about all her health issues can bring tears to her eyes.  She is a cigarette smoker and has decreased her habit to a little under 1/2 pack a day. She has not set a quit date yet. She has reached out to the [facility] Quitline and received nicotene patches that she has used some. She has chronic pain . She has to manage her medications to not cause her to sleep all the time. She has had liver disease for over 20 years.    Expected Outcomes STG She is able to attend all scheduled sessions, she will continue to work on smoking cessation, and she will reach out to her physician if her depression symptoms become daily.  LTG: She will continue to progress with her exercise and manage any depression symptoms that occur after discharge    Continue Psychosocial Services  Follow up required by staff             Psychosocial Re-Evaluation:   Psychosocial Discharge (Final Psychosocial Re-Evaluation):   Vocational Rehabilitation: Provide vocational rehab assistance to qualifying candidates.   Vocational Rehab Evaluation & Intervention:  Vocational Rehab - 03/04/22 1042       Initial Vocational Rehab Evaluation & Intervention   Assessment shows need for  Vocational Rehabilitation No      Vocational Rehab Re-Evaulation   Comments retired      Discharge Vocational Rehab   Discharge Vocational Rehabilitation retired             Education: Education Goals: Education classes will be provided on a variety of topics geared toward better understanding of heart health and risk factor modification. Participant will state understanding/return demonstration of topics presented as noted by education test scores.  Learning Barriers/Preferences:   General Cardiac Education Topics:  AED/CPR: - Group verbal and written instruction with the use of models to demonstrate the basic use of the AED with the basic ABC's of resuscitation.   Anatomy and Cardiac Procedures: - Group verbal and visual presentation and models provide information about basic cardiac anatomy and function. Reviews the testing methods done to diagnose heart disease and the outcomes of the test results. Describes the treatment choices: Medical Management, Angioplasty, or Coronary Bypass Surgery for treating various heart conditions including Myocardial Infarction, Angina, Valve Disease, and Cardiac Arrhythmias.  Written material given at graduation.   Medication Safety: - Group verbal and visual instruction to review commonly prescribed medications for heart and lung disease. Reviews the medication, class of the drug, and side effects. Includes the steps to properly store meds and maintain the prescription regimen.  Written material given at graduation.   Intimacy: - Group verbal instruction through game format to discuss how heart and lung disease can affect sexual intimacy. Written material given at graduation..   Know Your Numbers and Heart Failure: - Group verbal and visual instruction to discuss disease risk factors for cardiac and pulmonary disease and treatment options.  Reviews associated critical values for Overweight/Obesity, Hypertension, Cholesterol, and Diabetes.   Discusses basics of heart failure: signs/symptoms and treatments.  Introduces Heart Failure Zone chart for action plan for heart failure.  Written material given at graduation.   Infection Prevention: - Provides verbal and written material to individual with discussion of infection control including proper hand washing and proper equipment cleaning  during exercise session. Flowsheet Row Cardiac Rehab from 03/12/2022 in [facility] Cardiac and Pulmonary Rehab  Date 03/12/22  Educator [facility]  Instruction Review Code 1- Verbalizes Understanding       Falls Prevention: - Provides verbal and written material to individual with discussion of falls prevention and safety. Flowsheet Row Cardiac Rehab from 03/12/2022 in [facility] Cardiac and Pulmonary Rehab  Date 03/12/22  Educator [facility]  Instruction Review Code 1- Verbalizes Understanding       Other: -Provides group and verbal instruction on various topics (see comments)   Knowledge Questionnaire Score:  Knowledge Questionnaire Score - 03/12/22 1549       Knowledge Questionnaire Score   Pre Score Patient didn't have time to fill out in orientaiton, knowledge quiz sent home for patient to complete and bring back.             Core Components/Risk Factors/Patient Goals at Admission:  Personal Goals and Risk Factors at Admission - 03/12/22 1547       Core Components/Risk Factors/Patient Goals on Admission    Weight Management Yes    Intervention Weight Management: Provide education and appropriate resources to help participant work on and attain dietary goals.;Weight Management: Develop a combined nutrition and exercise program designed to reach desired caloric intake, while maintaining appropriate intake of nutrient and fiber, sodium and fats, and appropriate energy expenditure required for the weight goal.    Admit Weight 173 lb 4.8 oz (78.6 kg)    Goal Weight: Short Term 165 lb (74.8 kg)    Goal Weight: Long Term 155 lb (70.3 kg)    Expected  Outcomes Short Term: Continue to assess and modify interventions until short term weight is achieved;Long Term: Adherence to nutrition and physical activity/exercise program aimed toward attainment of established weight goal;Weight Loss: Understanding of general recommendations for a balanced deficit meal plan, which promotes 1-2 lb weight loss per week and includes a negative energy balance of [id number] kcal/d    Tobacco Cessation Yes    Number of packs per day 1/2 pack  a day   Shannon [name] is a current tobacco user. Intervention for tobacco cessation was provided at the initial medical review. She was asked about readiness to quit and reported that she has not set a quit date. She has weaned down to under a pack a day and is working towards a quit.   . Patient was advised and educated about tobacco cessation using combination therapy, tobacco cessation classes, quit line, and quit smoking apps. Patient demonstrated understanding of this material. Staff will continue to provide encouragement and follow up with the patient throughout the program.  She has already reached out to the [facility] Quitline and received patches. She has used some patches.    Intervention Assist the participant in steps to quit. Provide individualized education and counseling about committing to Tobacco Cessation, relapse prevention, and pharmacological support that can be provided by physician.;Advice worker, assist with locating and accessing local/national Quit Smoking programs, and support quit date choice.    Expected Outcomes Short Term: Will demonstrate readiness to quit, by selecting a quit date.;Short Term: Will quit all tobacco product use, adhering to prevention of relapse plan.;Long Term: Complete abstinence from all tobacco products for at least 12 months from quit date.    Diabetes Yes    Intervention Provide education about signs/symptoms and action to take for hypo/hyperglycemia.;Provide education about proper  nutrition, including hydration, and aerobic/resistive exercise prescription along with prescribed medications to achieve blood glucose in  normal ranges: Fasting glucose 65-99 mg/dL    Expected Outcomes Short Term: Participant verbalizes understanding of the signs/symptoms and immediate care of hyper/hypoglycemia, proper foot care and importance of medication, aerobic/resistive exercise and nutrition plan for blood glucose control.;Long Term: Attainment of HbA1C < 7%.    Heart Failure Yes    Intervention Provide a combined exercise and nutrition program that is supplemented with education, support and counseling about heart failure. Directed toward relieving symptoms such as shortness of breath, decreased exercise tolerance, and extremity edema.    Expected Outcomes Improve functional capacity of life;Short term: Attendance in program 2-3 days a week with increased exercise capacity. Reported lower sodium intake. Reported increased fruit and vegetable intake. Reports medication compliance.;Short term: Daily weights obtained and reported for increase. Utilizing diuretic protocols set by physician.;Long term: Adoption of self-care skills and reduction of barriers for early signs and symptoms recognition and intervention leading to self-care maintenance.             Education:Diabetes - Individual verbal and written instruction to review signs/symptoms of diabetes, desired ranges of glucose level fasting, after meals and with exercise. Acknowledge that pre and post exercise glucose checks will be done for 3 sessions at entry of program. [place] from 03/12/2022 in [facility] Cardiac and Pulmonary Rehab  Date 03/12/22  Educator [facility]  Instruction Review Code 1- Verbalizes Understanding       Core Components/Risk Factors/Patient Goals Review:    Core Components/Risk Factors/Patient Goals at Discharge (Final Review):    ITP Comments:  ITP Comments     [place] Name 03/04/22 1100 03/12/22  1529         ITP Comments Virtual orientation call completed today. shehas an appointment on Date: 03/12/2022  for EP eval and gym Orientation.  Documentation of diagnosis can be found in [facility] Date: 11/05/2021 .     Shannon [name] is a current tobacco user. Intervention for tobacco cessation was provided at the initial medical review. She was asked about readiness to quit and reported that she has not set a quit date. She has weaned down to under a pack a day and is working towards a quit.   . Patient was advised and educated about tobacco cessation using combination therapy, tobacco cessation classes, quit line, and quit smoking apps. Patient demonstrated understanding of this material. Staff will continue to provide encouragement and follow up with the patient throughout the program.  She has already reached out to the [facility] Quitline and received patches. She has used some patches. Completed 6MWT and gym orientation. Initial ITP created and sent for review to Dr. Emily Filbert, Medical Director.Shannon [name]  is a current tobacco user. Intervention for tobacco cessation was provided at the initial medical review. She was asked about readiness to quit and reported she has called the quit line and is working with the resources they have given her but does not currently have a quit date.  . Patient was advised and educated about tobacco cessation using combination therapy, tobacco cessation classes, quit line, and quit smoking apps. Patient demonstrated understanding of this material. Staff will continue to provide encouragement and follow up with the patient throughout the program.               Comments: initial ITP

## 2022-03-13 DIAGNOSIS — I8510 Secondary esophageal varices without bleeding: Secondary | ICD-10-CM | POA: Insufficient documentation

## 2022-03-20 ENCOUNTER — Encounter: Payer: Medicare Other | Admitting: *Deleted

## 2022-03-20 DIAGNOSIS — I5022 Chronic systolic (congestive) heart failure: Secondary | ICD-10-CM

## 2022-03-20 LAB — GLUCOSE, CAPILLARY
Glucose-Capillary: 125 mg/dL — ABNORMAL HIGH (ref 70–99)
Glucose-Capillary: 87 mg/dL (ref 70–99)

## 2022-03-20 NOTE — Progress Notes (Signed)
Daily Session Note  Patient Details  Name: Shannon [name] MRN: [id number] Date of Birth: [birth year] Referring Provider:   Flowsheet Row Cardiac Rehab from 03/12/2022 in [facility] Cardiac and Pulmonary Rehab  Referring Provider C. Deen       Encounter Date: 03/20/2022  Check In:  Session Check In - 03/20/22 1314       Check-In   Supervising physician immediately available to respond to emergencies See telemetry face sheet for immediately available ER MD    Location [facility]-Cardiac & Pulmonary Rehab    Staff Present Coralie Keens, MS, ASCM CEP, Exercise Physiologist;Meredith Sherryll Burger, RN BSN;Joseph Tessie Fass, Ernestina Patches, RN, [place]    Virtual Visit No    Medication changes reported     No    Fall or balance concerns reported    No    Warm-up and Cool-down Performed on first and last piece of equipment    Resistance Training Performed Yes    VAD Patient? No    PAD/SET Patient? No      Pain Assessment   Currently in Pain? No/denies                Social History   Tobacco Use  Smoking Status Every Day   Packs/day: 0.50   Years: 30.00   Total pack years: 15.00   Types: Cigarettes  Smokeless Tobacco Never  Tobacco Comments   Aware of need to quit  has decreased number of cigarettes per day. Has reached out to [facility] Quitline and has patches. No quit date yet.     Goals Met:  Independence with exercise equipment Exercise tolerated well No report of concerns or symptoms today Strength training completed today  Goals Unmet:  Not Applicable  Comments: First full day of exercise!  Patient was oriented to gym and equipment including functions, settings, policies, and procedures.  Patient's individual exercise prescription and treatment plan were reviewed.  All starting workloads were established based on the results of the 6 minute walk test done at initial orientation visit.  The plan for exercise progression was also introduced and progression will be customized based on  patient's performance and goals.    Dr. Emily Filbert is Medical Director for [place].  Dr. Ottie Glazier is Medical Director for [facility] Pulmonary Rehabilitation.

## 2022-03-25 ENCOUNTER — Encounter: Payer: Medicare Other | Admitting: *Deleted

## 2022-03-25 DIAGNOSIS — I5022 Chronic systolic (congestive) heart failure: Secondary | ICD-10-CM | POA: Diagnosis not present

## 2022-03-25 LAB — GLUCOSE, CAPILLARY
Glucose-Capillary: 171 mg/dL — ABNORMAL HIGH (ref 70–99)
Glucose-Capillary: 104 mg/dL — ABNORMAL HIGH (ref 70–99)

## 2022-03-25 NOTE — Progress Notes (Signed)
Daily Session Note  Patient Details  Name: VERLIE LIOTTA MRN: [id number] Date of Birth: [birth year] Referring Provider:   Flowsheet Row Cardiac Rehab from 03/12/2022 in [facility] Cardiac and Pulmonary Rehab  Referring Provider C. Deen       Encounter Date: 03/25/2022  Check In:  Session Check In - 03/25/22 1114       Check-In   Supervising physician immediately available to respond to emergencies See telemetry face sheet for immediately available ER MD    Location [facility]-Cardiac & Pulmonary Rehab    Staff Present Renita Papa, RN BSN;Jessica [place], MA, RCEP, CCRP, Marylynn Pearson, MS, ASCM CEP, Exercise Physiologist;Noah Tickle, BS, Exercise Physiologist    Virtual Visit No    Medication changes reported     No    Fall or balance concerns reported    No    Tobacco Cessation No Change    Current number of cigarettes/nicotine per day     7    Warm-up and Cool-down Performed on first and last piece of equipment    Resistance Training Performed Yes    VAD Patient? No    PAD/SET Patient? No      Pain Assessment   Currently in Pain? No/denies                Social History   Tobacco Use  Smoking Status Every Day   Packs/day: 0.50   Years: 30.00   Total pack years: 15.00   Types: Cigarettes  Smokeless Tobacco Never  Tobacco Comments   Aware of need to quit  has decreased number of cigarettes per day. Has reached out to [facility] Quitline and has patches. No quit date yet.     Goals Met:  Independence with exercise equipment Exercise tolerated well No report of concerns or symptoms today Strength training completed today  Goals Unmet:  Not Applicable  Comments: Pt able to follow exercise prescription today without complaint.  Will continue to monitor for progression.    Dr. Emily Filbert is Medical Director for [place].  Dr. Ottie Glazier is Medical Director for [facility] Pulmonary Rehabilitation.

## 2022-03-27 ENCOUNTER — Encounter: Payer: Medicare Other | Admitting: *Deleted

## 2022-03-27 ENCOUNTER — Encounter: Payer: Self-pay | Admitting: Family Medicine

## 2022-03-27 DIAGNOSIS — I5022 Chronic systolic (congestive) heart failure: Secondary | ICD-10-CM | POA: Diagnosis not present

## 2022-03-27 LAB — GLUCOSE, CAPILLARY
Glucose-Capillary: 130 mg/dL — ABNORMAL HIGH (ref 70–99)
Glucose-Capillary: 106 mg/dL — ABNORMAL HIGH (ref 70–99)

## 2022-03-27 NOTE — Progress Notes (Signed)
Daily Session Note  Patient Details  Name: Shannon [name] MRN: [id number] Date of Birth: Oct 09, 1955 Referring Provider:   Flowsheet Row Cardiac Rehab from 03/12/2022 in [facility] Cardiac and Pulmonary Rehab  Referring Provider C. Deen       Encounter Date: 03/27/2022  Check In:  Session Check In - 03/27/22 1138       Check-In   Supervising physician immediately available to respond to emergencies See telemetry face sheet for immediately available ER MD    Location [facility]-Cardiac & Pulmonary Rehab    Staff Present Renita Papa, RN BSN;Jessica [place], MA, RCEP, CCRP, CCET;Joseph [place], [place], RN, [place]    Virtual Visit No    Medication changes reported     No    Fall or balance concerns reported    No    Warm-up and Cool-down Performed on first and last piece of equipment    Resistance Training Performed Yes    VAD Patient? No    PAD/SET Patient? No      Pain Assessment   Currently in Pain? No/denies                Social History   Tobacco Use  Smoking Status Every Day   Packs/day: 0.50   Years: 30.00   Total pack years: 15.00   Types: Cigarettes  Smokeless Tobacco Never  Tobacco Comments   Aware of need to quit  has decreased number of cigarettes per day. Has reached out to [facility] Quitline and has patches. No quit date yet.     Goals Met:  Independence with exercise equipment Exercise tolerated well No report of concerns or symptoms today Strength training completed today  Goals Unmet:  Not Applicable  Comments: Pt able to follow exercise prescription today without complaint.  Will continue to monitor for progression.    Dr. Emily Filbert is Medical Director for [place].  Dr. Ottie Glazier is Medical Director for [facility] Pulmonary Rehabilitation.

## 2022-04-01 ENCOUNTER — Encounter: Payer: Medicare Other | Admitting: *Deleted

## 2022-04-01 DIAGNOSIS — I5022 Chronic systolic (congestive) heart failure: Secondary | ICD-10-CM

## 2022-04-01 NOTE — Progress Notes (Signed)
Daily Session Note  Patient Details  Name: DOLORAS TELLADO MRN: [id number] Date of Birth: 1956-03-16 Referring Provider:   Flowsheet Row Cardiac Rehab from 03/12/2022 in [facility] Cardiac and Pulmonary Rehab  Referring Provider C. Deen       Encounter Date: 04/01/2022  Check In:  Session Check In - 04/01/22 1104       Check-In   Supervising physician immediately available to respond to emergencies See telemetry face sheet for immediately available ER MD    Location [facility]-Cardiac & Pulmonary Rehab    Staff Present Renita Papa, RN BSN;Jessica [place], MA, RCEP, CCRP, [place], MS, ASCM CEP, Exercise Physiologist    Virtual Visit No    Medication changes reported     No    Fall or balance concerns reported    No    Tobacco Cessation No Change    Current number of cigarettes/nicotine per day     7    Warm-up and Cool-down Performed on first and last piece of equipment    Resistance Training Performed Yes    VAD Patient? No    PAD/SET Patient? No      Pain Assessment   Currently in Pain? No/denies                Social History   Tobacco Use  Smoking Status Every Day   Packs/day: 0.50   Years: 30.00   Total pack years: 15.00   Types: Cigarettes  Smokeless Tobacco Never  Tobacco Comments   Aware of need to quit  has decreased number of cigarettes per day. Has reached out to [facility] Quitline and has patches. No quit date yet.     Goals Met:  Independence with exercise equipment Exercise tolerated well No report of concerns or symptoms today Strength training completed today  Goals Unmet:  Not Applicable  Comments: Pt able to follow exercise prescription today without complaint.  Will continue to monitor for progression.    Dr. Emily Filbert is Medical Director for [place].  Dr. Ottie Glazier is Medical Director for [facility] Pulmonary Rehabilitation.

## 2022-04-08 DIAGNOSIS — K766 Portal hypertension: Secondary | ICD-10-CM | POA: Diagnosis not present

## 2022-04-09 ENCOUNTER — Encounter: Payer: Self-pay | Admitting: *Deleted

## 2022-04-09 DIAGNOSIS — I5022 Chronic systolic (congestive) heart failure: Secondary | ICD-10-CM

## 2022-04-09 NOTE — Progress Notes (Signed)
Cardiac Individual Treatment Plan  Patient Details  Name: Shannon [name] MRN: [id number] Date of Birth: [birth year] Referring Provider:   Flowsheet Row Cardiac Rehab from 03/12/2022 in [facility] Cardiac and Pulmonary Rehab  Referring Provider C. Deen       Initial Encounter Date:  Flowsheet Row Cardiac Rehab from 03/12/2022 in [facility] Cardiac and Pulmonary Rehab  Date 03/12/22       Visit Diagnosis: Heart failure, chronic systolic (HCC)  Patient's Home Medications on Admission:  Current Outpatient Medications:    acetaminophen (TYLENOL) 650 MG CR tablet, Take by mouth., Disp: , Rfl:    albuterol (VENTOLIN HFA) 108 (90 Base) MCG/ACT inhaler, USE 1 TO 2 INHALATIONS BY  MOUTH INTO THE LUNGS EVERY  6 HOURS AS NEEDED FOR  WHEEZING OR SHORTNESS OF  BREATH, Disp: 34 g, Rfl: 2   apixaban (ELIQUIS) 5 MG TABS tablet, Take 1 tablet (5 mg total) by mouth 2 (two) times daily., Disp: , Rfl: 0   azaTHIOprine (IMURAN) 50 MG tablet, Take 4 tablets (200 mg total) by mouth daily., Disp: , Rfl:    blood glucose meter kit and supplies KIT, Dispense based on patient and insurance preference. Use up to four times daily as directed. (FOR ICD-9 250.00, 250.01)., Disp: 1 each, Rfl: 0   blood glucose meter kit and supplies, Dispense based on patient and insurance preference. Use up to four times daily as directed. (FOR ICD-10 E10.9, E11.9)., Disp: 1 each, Rfl: 0   Blood Glucose Monitoring Suppl (ONE TOUCH ULTRA SYSTEM KIT) W/DEVICE KIT, Test blood sugar as directed Dx E09.9, Disp: 1 each, Rfl: 0   celecoxib (CELEBREX) 100 MG capsule, Take 100 mg by mouth daily., Disp: , Rfl:    Cholecalciferol (VITAMIN D3) 5000 units TABS, Take by mouth. , Disp: , Rfl:    empagliflozin (JARDIANCE) 25 MG TABS tablet, Take 1 tablet (25 mg total) by mouth daily before breakfast., Disp: , Rfl:    furosemide (LASIX) 40 MG tablet, Take 1 tablet (40 mg total) by mouth daily., Disp: , Rfl: 0   glucose blood (ONETOUCH VERIO) test strip, 1  each by Other route 4 (four) times daily. Use as instructed, Disp: 400 strip, Rfl: 3   insulin degludec (TRESIBA FLEXTOUCH) 100 UNIT/ML FlexTouch Pen, Inject 10 Units into the skin daily. (Patient not taking: Reported on 03/04/2022), Disp: 15 mL, Rfl: 11   Insulin Pen Needle (PEN NEEDLES) 31G X 6 MM MISC, Use with Tyler Aas as instructed daily (Patient not taking: Reported on 03/04/2022), Disp: 100 each, Rfl: 11   lactulose (CHRONULAC) 10 GM/15ML solution, TAKE 30 ML BY MOUTH 4 TIMES DAILY AS NEEDED (Patient taking differently: Take 30 g by mouth. 50-60 g 1x per day not 30 g qid), Disp: 10800 mL, Rfl: 3   lidocaine (LIDODERM) 5 %, Place 1 patch onto the skin daily. Remove & Discard patch within 12 hours or as directed by MD, Disp: 30 patch, Rfl: 5   loratadine (CLARITIN) 10 MG tablet, Take 10 mg by mouth every other day as needed for allergies., Disp: , Rfl:    losartan (COZAAR) 25 MG tablet, Take 1 tablet by mouth daily., Disp: , Rfl:    Magnesium Oxide 500 MG TABS, Take by mouth., Disp: , Rfl:    metFORMIN (GLUCOPHAGE-XR) 500 MG 24 hr tablet, TAKE 2 TABLETS BY MOUTH TWICE  DAILY WITH A MEAL, Disp: 400 tablet, Rfl: 2   metoprolol tartrate (LOPRESSOR) 50 MG tablet, Take 1 tablet (50 mg total) by mouth  2 (two) times daily., Disp: , Rfl: 0   miconazole (MICONAZOLE 3) 200 MG vaginal suppository, Place 1 suppository (200 mg total) vaginally at bedtime., Disp: 3 suppository, Rfl: 0   nystatin (MYCOSTATIN) 100000 UNIT/ML suspension, Take 5 mLs (500,000 Units total) by mouth 4 (four) times daily. Hold and spit (Patient not taking: Reported on 03/04/2022), Disp: 473 mL, Rfl: 0   omeprazole (PRILOSEC) 40 MG capsule, TAKE 1 CAPSULE BY MOUTH  DAILY 1/2 HOUR BEFORE A  MEAL, Disp: 90 capsule, Rfl: 3   predniSONE (DELTASONE) 1 MG tablet, Take 3 mg by mouth daily., Disp: , Rfl:    Simethicone 180 MG CAPS, Take by mouth., Disp: , Rfl:    spironolactone (ALDACTONE) 25 MG tablet, Take 1 tablet (25 mg total) by mouth  daily., Disp: , Rfl: 0   traMADol (ULTRAM) 50 MG tablet, Take by mouth., Disp: , Rfl:    traZODone (DESYREL) 50 MG tablet, Take 25 mg by mouth at bedtime as needed., Disp: , Rfl:    vitamin B-12 (CYANOCOBALAMIN) 100 MCG tablet, Take 100 mcg by mouth daily., Disp: , Rfl:   Past Medical History: Past Medical History:  Diagnosis Date   Abdominal bloating 08/16/2013   Adjustment disorder 11/04/2010   Asthma    Atrial fibrillation with RVR ([place])    as of 11/2021   Autoimmune hepatitis ([place])    Bradycardia    Depression    Diabetes mellitus without complication ([place])    Dilated cardiomyopathy ([place])    DUB (dysfunctional uterine bleeding)    2008 s/p hysterectomy   Elevated liver enzymes 04/11/2020   Exposure to TB 2005   tx'ed x 6 months   Fatigue 01/23/2015   Gallstones    Gastric ulcer    GERD (gastroesophageal reflux disease)    HFrEF (heart failure with reduced ejection fraction) ([place])    unc cards as of 11/2021   History of asthma 02/26/2018   History of chicken pox    Hx of migraines    Leukopenia 04/11/2020   Liver disease    very low liver function, auto-immune hepatitis   Motion sickness    Osteoarthritis    Systemic lupus erythematosus ([place]) 05/31/2010   Qualifier: Diagnosis of  By: Deborra Medina MD, Talia     Vitamin D deficiency     Tobacco Use: Social History   Tobacco Use  Smoking Status Every Day   Packs/day: 0.50   Years: 30.00   Total pack years: 15.00   Types: Cigarettes  Smokeless Tobacco Never  Tobacco Comments   Aware of need to quit  has decreased number of cigarettes per day. Has reached out to [facility] Quitline and has patches. No quit date yet.     Labs: Review Flowsheet  More data exists      Latest Ref Rng & Units 04/28/2019 07/25/2020 11/27/2020 03/20/2021 12/18/2021  Labs for ITP Cardiac and Pulmonary Rehab  Cholestrol 100 - 199 mg/dL 193  131  123  159  -  LDL (calc) 0 - 99 mg/dL 114  65  60  78  -  Direct LDL mg/dL - - - - 60.0   HDL-C >39 mg/dL 67   56  48.30  69  -  Trlycerides 0 - 149 mg/dL 66  39  73.0  58  -  Hemoglobin A1c 4.6 - 6.5 % 6.5  6.1  - 6.2  6.8      Exercise Target Goals: Exercise Program Goal: Individual exercise prescription set using results  from initial 6 min walk test and THRR while considering  patient's activity barriers and safety.   Exercise Prescription Goal: Initial exercise prescription builds to 30-45 minutes a day of aerobic activity, 2-3 days per week.  Home exercise guidelines will be given to patient during program as part of exercise prescription that the participant will acknowledge.   Education: Aerobic Exercise: - Group verbal and visual presentation on the components of exercise prescription. Introduces F.I.T.T principle from ACSM for exercise prescriptions.  Reviews F.I.T.T. principles of aerobic exercise including progression. Written material given at graduation. Flowsheet Row Cardiac Rehab from 03/27/2022 in [facility] Cardiac and Pulmonary Rehab  Date 03/20/22  Educator [facility]  Instruction Review Code 1- Verbalizes Understanding       Education: Resistance Exercise: - Group verbal and visual presentation on the components of exercise prescription. Introduces F.I.T.T principle from ACSM for exercise prescriptions  Reviews F.I.T.T. principles of resistance exercise including progression. Written material given at graduation.    Education: Exercise & Equipment Safety: - Individual verbal instruction and demonstration of equipment use and safety with use of the equipment. Flowsheet Row Cardiac Rehab from 03/27/2022 in [facility] Cardiac and Pulmonary Rehab  Date 03/12/22  Educator [facility]  Instruction Review Code 1- Verbalizes Understanding       Education: Exercise Physiology & General Exercise Guidelines: - Group verbal and written instruction with models to review the exercise physiology of the cardiovascular system and associated critical values. Provides general exercise guidelines with specific  guidelines to those with heart or lung disease.    Education: Flexibility, Balance, Mind/Body Relaxation: - Group verbal and visual presentation with interactive activity on the components of exercise prescription. Introduces F.I.T.T principle from ACSM for exercise prescriptions. Reviews F.I.T.T. principles of flexibility and balance exercise training including progression. Also discusses the mind body connection.  Reviews various relaxation techniques to help reduce and manage stress (i.e. Deep breathing, progressive muscle relaxation, and visualization). Balance handout provided to take home. Written material given at graduation.   Activity Barriers & Risk Stratification:  Activity Barriers & Cardiac Risk Stratification - 03/04/22 1036       Activity Barriers & Cardiac Risk Stratification   Activity Barriers Back Problems   chronic pain, knows her limits   Cardiac Risk Stratification High             6 Minute Walk:  6 Minute Walk     Row Name 03/12/22 1530         6 Minute Walk   Phase Initial     Distance 1265 feet     Walk Time 6 minutes     # of Rest Breaks 0     MPH 2.4     METS 3.03     RPE 11     Perceived Dyspnea  1     VO2 Peak 10.61     Symptoms Yes (comment)     Comments left leg joint pain during test 5/10 pain     Resting HR 71 bpm     Resting BP 110/64     Resting Oxygen Saturation  98 %     Exercise Oxygen Saturation  during 6 min walk 98 %     Max Ex. HR 90 bpm     Max Ex. BP 124/72     2 Minute Post BP 114/70              Oxygen Initial Assessment:   Oxygen Re-Evaluation:   Oxygen Discharge (Final Oxygen Re-Evaluation):  Initial Exercise Prescription:  Initial Exercise Prescription - 03/12/22 1500       Date of Initial Exercise RX and Referring Provider   Date 03/12/22    Referring Provider C. Deen      Oxygen   Maintain Oxygen Saturation 88% or higher      Treadmill   MPH 2.2    Grade 1    Minutes 15    METs 2.99       NuStep   Level 2    SPM 80    Minutes 15    METs 3.03      REL-XR   Level 1    Speed 50    Minutes 5    METs 3.03      Biostep-RELP   Level 1    SPM 50    Minutes 15    METs 3.03      Prescription Details   Frequency (times per week) 2    Duration Progress to 30 minutes of continuous aerobic without signs/symptoms of physical distress      Intensity   THRR 40-80% of Max Heartrate 105-139    Ratings of Perceived Exertion 11-13    Perceived Dyspnea 0-4      Progression   Progression Continue to progress workloads to maintain intensity without signs/symptoms of physical distress.      Resistance Training   Training Prescription Yes    Weight 2    Reps 10-15             Perform Capillary Blood Glucose checks as needed.  Exercise Prescription Changes:   Exercise Prescription Changes     Row Name 03/12/22 1500 03/24/22 1600           Response to Exercise   Blood Pressure (Admit) 110/64 122/62      Blood Pressure (Exercise) 124/72 140/72      Blood Pressure (Exit) 114/70 118/62      Heart Rate (Admit) 71 bpm 72 bpm      Heart Rate (Exercise) 90 bpm 121 bpm      Heart Rate (Exit) 69 bpm 77 bpm      Oxygen Saturation (Admit) 98 % --      Oxygen Saturation (Exercise) 98 % --      Oxygen Saturation (Exit) 100 % --      Rating of Perceived Exertion (Exercise) 11 11      Perceived Dyspnea (Exercise) 1 --      Symptoms left leg joint pain 5/10 none      Comments 6 MWT results 1st full day of exercise      Duration -- Progress to 30 minutes of  aerobic without signs/symptoms of physical distress      Intensity -- THRR unchanged        Progression   Progression -- Continue to progress workloads to maintain intensity without signs/symptoms of physical distress.      Average METs -- 2.68        Resistance Training   Training Prescription -- Yes      Weight -- 2 lb      Reps -- 10-15        Interval Training   Interval Training -- No         Biostep-RELP   Level -- 3      Minutes -- 15      METs -- 3        Track   Laps -- 25  Minutes -- 15      METs -- 2.36        Oxygen   Maintain Oxygen Saturation -- 88% or higher               Exercise Comments:   Exercise Comments     Row Name 03/20/22 1321           Exercise Comments First full day of exercise!  Patient was oriented to gym and equipment including functions, settings, policies, and procedures.  Patient's individual exercise prescription and treatment plan were reviewed.  All starting workloads were established based on the results of the 6 minute walk test done at initial orientation visit.  The plan for exercise progression was also introduced and progression will be customized based on patient's performance and goals.                Exercise Goals and Review:   Exercise Goals     Row Name 03/12/22 1540             Exercise Goals   Increase Physical Activity Yes       Intervention Develop an individualized exercise prescription for aerobic and resistive training based on initial evaluation findings, risk stratification, comorbidities and participant's personal goals.;Provide advice, education, support and counseling about physical activity/exercise needs.       Expected Outcomes Short Term: Attend rehab on a regular basis to increase amount of physical activity.;Long Term: Add in home exercise to make exercise part of routine and to increase amount of physical activity.;Long Term: Exercising regularly at least 3-5 days a week.       Increase Strength and Stamina Yes       Intervention Provide advice, education, support and counseling about physical activity/exercise needs.;Develop an individualized exercise prescription for aerobic and resistive training based on initial evaluation findings, risk stratification, comorbidities and participant's personal goals.       Expected Outcomes Short Term: Increase workloads from initial exercise  prescription for resistance, speed, and METs.;Short Term: Perform resistance training exercises routinely during rehab and add in resistance training at home;Long Term: Improve cardiorespiratory fitness, muscular endurance and strength as measured by increased METs and functional capacity (6MWT)       Able to understand and use rate of perceived exertion (RPE) scale Yes       Intervention Provide education and explanation on how to use RPE scale       Expected Outcomes Short Term: Able to use RPE daily in rehab to express subjective intensity level;Long Term:  Able to use RPE to guide intensity level when exercising independently       Able to understand and use Dyspnea scale Yes       Intervention Provide education and explanation on how to use Dyspnea scale       Expected Outcomes Short Term: Able to use Dyspnea scale daily in rehab to express subjective sense of shortness of breath during exertion;Long Term: Able to use Dyspnea scale to guide intensity level when exercising independently       Knowledge and understanding of Target Heart Rate Range (THRR) Yes       Intervention Provide education and explanation of THRR including how the numbers were predicted and where they are located for reference       Expected Outcomes Short Term: Able to state/look up THRR;Short Term: Able to use daily as guideline for intensity in rehab;Long Term: Able to use THRR to govern intensity when exercising independently  Able to check pulse independently Yes       Intervention Provide education and demonstration on how to check pulse in carotid and radial arteries.;Review the importance of being able to check your own pulse for safety during independent exercise       Expected Outcomes Short Term: Able to explain why pulse checking is important during independent exercise;Long Term: Able to check pulse independently and accurately       Understanding of Exercise Prescription Yes       Intervention Provide  education, explanation, and written materials on patient's individual exercise prescription       Expected Outcomes Short Term: Able to explain program exercise prescription;Long Term: Able to explain home exercise prescription to exercise independently       Improve claudication pain toleration; Improve walking ability Yes       Intervention Participate in PAD/SET Rehab 2-3 days a week, walking at home as part of exercise prescription;Attend education sessions to aid in risk factor modification and understanding of disease process       Expected Outcomes Short Term: Improve walking distance/time to onset of claudication pain;Long Term: Improve score of PAD questionnaires                Exercise Goals Re-Evaluation :  Exercise Goals Re-Evaluation     Row Name 03/20/22 1321 03/24/22 1611           Exercise Goal Re-Evaluation   Exercise Goals Review Able to understand and use rate of perceived exertion (RPE) scale;Able to understand and use Dyspnea scale;Knowledge and understanding of Target Heart Rate Range (THRR);Understanding of Exercise Prescription Increase Physical Activity;Increase Strength and Stamina;Understanding of Exercise Prescription      Comments Reviewed RPE scale, THR and program prescription with pt today.  Pt voiced understanding and was given a copy of goals to take home. Zipporah is off to a good start with rehab. She completed her 1st session and was able to work at level 3 on [employer] and complete 25 laps on the track. We will continue to monitor patient as she progresses in the program.      Expected Outcomes Short: Use RPE daily to regulate intensity. Long: Follow program prescription in THR. Short: Continue initial exercise prescription Long: Build up overall strength and stamina               Discharge Exercise Prescription (Final Exercise Prescription Changes):  Exercise Prescription Changes - 03/24/22 1600       Response to Exercise   Blood Pressure  (Admit) 122/62    Blood Pressure (Exercise) 140/72    Blood Pressure (Exit) 118/62    Heart Rate (Admit) 72 bpm    Heart Rate (Exercise) 121 bpm    Heart Rate (Exit) 77 bpm    Rating of Perceived Exertion (Exercise) 11    Symptoms none    Comments 1st full day of exercise    Duration Progress to 30 minutes of  aerobic without signs/symptoms of physical distress    Intensity THRR unchanged      Progression   Progression Continue to progress workloads to maintain intensity without signs/symptoms of physical distress.    Average METs 2.68      Resistance Training   Training Prescription Yes    Weight 2 lb    Reps 10-15      Interval Training   Interval Training No      Biostep-RELP   Level 3    Minutes 15  METs 3      Track   Laps 25    Minutes 15    METs 2.36      Oxygen   Maintain Oxygen Saturation 88% or higher             Nutrition:  Target Goals: Understanding of nutrition guidelines, daily intake of sodium <1549m, cholesterol <2015m calories 30% from fat and 7% or less from saturated fats, daily to have 5 or more servings of fruits and vegetables.  Education: All About Nutrition: -Group instruction provided by verbal, written material, interactive activities, discussions, models, and posters to present general guidelines for heart healthy nutrition including fat, fiber, MyPlate, the role of sodium in heart healthy nutrition, utilization of the nutrition label, and utilization of this knowledge for meal planning. Follow up email sent as well. Written material given at graduation. Flow[facility]  Date [facility]/27/22  [facility] Code 1- Verbalizes Understanding       Biometrics:  Pre Biometrics - 03/12/22 1542       Pre Biometrics   Height 5' 6" (1.676 m)    Weight 173 lb 4.8 oz (78.6 kg)    Waist Circumference 39.5 inches    Hip Circumference 43 inches    Waist to Hip  Ratio 0.92 %    BMI (Calculated) 27.98    Single Leg Stand 30 seconds              Nutrition Therapy Plan and Nutrition Goals:  Nutrition Therapy & Goals - 03/12/22 1547       Intervention Plan   Intervention Prescribe, educate and counsel regarding individualized specific dietary modifications aiming towards targeted core components such as weight, hypertension, lipid management, diabetes, heart failure and other comorbidities.    Expected Outcomes Short Term Goal: Understand basic principles of dietary content, such as calories, fat, sodium, cholesterol and nutrients.;Short Term Goal: A plan has been developed with personal nutrition goals set during dietitian appointment.;Long Term Goal: Adherence to prescribed nutrition plan.             Nutrition Assessments:  MEDIFICTS Score Key: ?70 Need to make dietary changes  40-70 Heart Healthy Diet ? 40 Therapeutic Level Cholesterol Diet   Picture Your Plate Scores: <4<49nhealthy dietary pattern with much room for improvement. 41-50 Dietary pattern unlikely to meet recommendations for good health and room for improvement. 51-60 More healthful dietary pattern, with some room for improvement.  >60 Healthy dietary pattern, although there may be some specific behaviors that could be improved.    Nutrition Goals Re-Evaluation:  Nutrition Goals Re-Evaluation     [place] 03/27/22 1118             Goals   Current Weight 174 lb (78.9 kg)       Nutrition Goal Make an eating schedule       Comment JeSteffanitates she knows what to do but has a sporadic eating schedule. Patient deferres dietitian.       Expected Outcome Short: get on an eating schedule. Long: maintain a diet plan that pertains to her.                Nutrition Goals Discharge (Final Nutrition Goals Re-Evaluation):  Nutrition Goals Re-Evaluation - 03/27/22 1118       Goals   Current Weight 174 lb (78.9 kg)    Nutrition Goal Make an eating schedule  Comment Junko states she knows what to do but has a sporadic eating schedule. Patient deferres dietitian.    Expected Outcome Short: get on an eating schedule. Long: maintain a diet plan that pertains to her.             Psychosocial: Target Goals: Acknowledge presence or absence of significant depression and/or stress, maximize coping skills, provide positive support system. Participant is able to verbalize types and ability to use techniques and skills needed for reducing stress and depression.   Education: Stress, Anxiety, and Depression - Group verbal and visual presentation to define topics covered.  Reviews how body is impacted by stress, anxiety, and depression.  Also discusses healthy ways to reduce stress and to treat/manage anxiety and depression.  Written material given at graduation.   Education: Sleep Hygiene -Provides group verbal and written instruction about how sleep can affect your health.  Define sleep hygiene, discuss sleep cycles and impact of sleep habits. Review good sleep hygiene tips.    Initial Review & Psychosocial Screening:  Initial Psych Review & Screening - 03/04/22 1039       Initial Review   Current issues with Current Stress Concerns    Source of Stress Concerns Chronic Illness    Comments new diagnosis, new ICD/Pacer  still working on accepting the stress  keeping positive.      Family Dynamics   Good Support System? Yes   9 siblings, 3 children  and grandchildren.     Barriers   Psychosocial barriers to participate in program There are no identifiable barriers or psychosocial needs.      Screening Interventions   Interventions Encouraged to exercise;To provide support and resources with identified psychosocial needs;Provide feedback about the scores to participant    Expected Outcomes Short Term goal: Utilizing psychosocial counselor, staff and physician to assist with identification of specific Stressors or current issues interfering with  healing process. Setting desired goal for each stressor or current issue identified.;Long Term Goal: Stressors or current issues are controlled or eliminated.;Short Term goal: Identification and review with participant of any Quality of Life or Depression concerns found by scoring the questionnaire.;Long Term goal: The participant improves quality of Life and PHQ9 Scores as seen by post scores and/or verbalization of changes             Quality of Life Scores:   Quality of Life - 03/12/22 1545       Quality of Life   Select Quality of Life      Quality of Life Scores   Health/Function Pre 14.87 %    Socioeconomic Pre 18.5 %    Psych/Spiritual Pre 15.71 %    Family Pre 15.6 %    GLOBAL Pre 15.97 %            Scores of 19 and below usually indicate a poorer quality of life in these areas.  A difference of  2-3 points is a clinically meaningful difference.  A difference of 2-3 points in the total score of the Quality of Life Index has been associated with significant improvement in overall quality of life, self-image, physical symptoms, and general health in studies assessing change in quality of life.  PHQ-9: Review Flowsheet  More data exists      03/27/2022 03/12/2022 09/04/2021 03/19/2021 09/03/2020  Depression screen PHQ 2/9  Decreased Interest 0 2 0 3 0  Down, Depressed, Hopeless 1 2 0 2 0  PHQ - 2 Score 1 4 0 5 0  Altered sleeping 0 1 - - -  Tired, decreased energy 1 1 - - -  Change in appetite 1 1 - - -  Feeling bad or failure about yourself  2 0 - - -  Trouble concentrating 1 0 - - -  Moving slowly or fidgety/restless 0 0 - - -  Suicidal thoughts 0 0 - - -  PHQ-9 Score 6 7 - - -  Difficult doing work/chores Not difficult at all Not difficult at all - - -   Interpretation of Total Score  Total Score Depression Severity:  1-4 = Minimal depression, 5-9 = Mild depression, 10-14 = Moderate depression, 15-19 = Moderately severe depression, 20-27 = Severe depression    Psychosocial Evaluation and Intervention:  Psychosocial Evaluation - 03/04/22 1101       Psychosocial Evaluation & Interventions   Interventions Encouraged to exercise with the program and follow exercise prescription    Comments Delta has been dealing with multiple health issues and then new diagnosis of Heart Failure. She has had recent implantation of an ICD/Pacemaker. THis has all become overwhelming to her at times. She states she does her best to maintain a positive attitude and continue to manage her day to day life. She has a large support team with 9 siblings, 3 children and her grandchildren. SHe is aware that if she starts feeling depression that she cannot clear, that she will reach out to her physician for meds if needed.  She stated that there are days just talking about all her health issues can bring tears to her eyes.  She is a cigarette smoker and has decreased her habit to a little under 1/2 pack a day. She has not set a quit date yet. She has reached out to the [facility] Quitline and received nicotene patches that she has used some. She has chronic pain . She has to manage her medications to not cause her to sleep all the time. She has had liver disease for over 20 years.    Expected Outcomes STG She is able to attend all scheduled sessions, she will continue to work on smoking cessation, and she will reach out to her physician if her depression symptoms become daily.  LTG: She will continue to progress with her exercise and manage any depression symptoms that occur after discharge    Continue Psychosocial Services  Follow up required by staff             Psychosocial Re-Evaluation:  Psychosocial Re-Evaluation     [place] Name 03/27/22 1125             Psychosocial Re-Evaluation   Current issues with Current Stress Concerns       Comments Reviewed patient health questionnaire (PHQ-9) with patient for follow up. Previously, patients score indicated signs/symptoms of  depression.  Reviewed to see if patient is improving symptom wise while in program.  Score improved and patient states that it is because she has had a little more energy since she has started the program.       Expected Outcomes Short: Continue to attend [facility] regularly for regular exercise and social engagement. Long: Continue to improve symptoms and manage a positive mental state.       Interventions Encouraged to attend Cardiac Rehabilitation for the exercise       Continue Psychosocial Services  Follow up required by staff                Psychosocial Discharge (Final Psychosocial Re-Evaluation):  Psychosocial Re-Evaluation - 03/27/22 1125       Psychosocial Re-Evaluation   Current issues with Current Stress Concerns    Comments Reviewed patient health questionnaire (PHQ-9) with patient for follow up. Previously, patients score indicated signs/symptoms of depression.  Reviewed to see if patient is improving symptom wise while in program.  Score improved and patient states that it is because she has had a little more energy since she has started the program.    Expected Outcomes Short: Continue to attend [facility] regularly for regular exercise and social engagement. Long: Continue to improve symptoms and manage a positive mental state.    Interventions Encouraged to attend Cardiac Rehabilitation for the exercise    Continue Psychosocial Services  Follow up required by staff             Vocational Rehabilitation: Provide vocational rehab assistance to qualifying candidates.   Vocational Rehab Evaluation & Intervention:  Vocational Rehab - 03/04/22 1042       Initial Vocational Rehab Evaluation & Intervention   Assessment shows need for Vocational Rehabilitation No      Vocational Rehab Re-Evaulation   Comments retired      Discharge Vocational Rehab   Discharge Vocational Rehabilitation retired             Education: Education Goals: Education classes will  be provided on a variety of topics geared toward better understanding of heart health and risk factor modification. Participant will state understanding/return demonstration of topics presented as noted by education test scores.  Learning Barriers/Preferences:   General Cardiac Education Topics:  AED/CPR: - Group verbal and written instruction with the use of models to demonstrate the basic use of the AED with the basic ABC's of resuscitation.   Anatomy and Cardiac Procedures: - Group verbal and visual presentation and models provide information about basic cardiac anatomy and function. Reviews the testing methods done to diagnose heart disease and the outcomes of the test results. Describes the treatment choices: Medical Management, Angioplasty, or Coronary Bypass Surgery for treating various heart conditions including Myocardial Infarction, Angina, Valve Disease, and Cardiac Arrhythmias.  Written material given at graduation.   Medication Safety: - Group verbal and visual instruction to review commonly prescribed medications for heart and lung disease. Reviews the medication, class of the drug, and side effects. Includes the steps to properly store meds and maintain the prescription regimen.  Written material given at graduation.   Intimacy: - Group verbal instruction through game format to discuss how heart and lung disease can affect sexual intimacy. Written material given at graduation.. Flowsheet Row Cardiac Rehab from 03/27/2022 in [facility] Cardiac and Pulmonary Rehab  Date 03/20/22  Educator [facility]  Instruction Review Code 1- Verbalizes Understanding       Know Your Numbers and Heart Failure: - Group verbal and visual instruction to discuss disease risk factors for cardiac and pulmonary disease and treatment options.  Reviews associated critical values for Overweight/Obesity, Hypertension, Cholesterol, and Diabetes.  Discusses basics of heart failure: signs/symptoms and treatments.   Introduces Heart Failure Zone chart for action plan for heart failure.  Written material given at graduation.   Infection Prevention: - Provides verbal and written material to individual with discussion of infection control including proper hand washing and proper equipment cleaning during exercise session. Flowsheet Row Cardiac Rehab from 03/27/2022 in [facility] Cardiac and Pulmonary Rehab  Date 03/12/22  Educator [facility]  Instruction Review Code 1- Verbalizes Understanding       Falls Prevention: - Provides verbal  and written material to individual with discussion of falls prevention and safety. Flowsheet Row Cardiac Rehab from 03/27/2022 in [facility] Cardiac and Pulmonary Rehab  Date 03/12/22  Educator [facility]  Instruction Review Code 1- Verbalizes Understanding       Other: -Provides group and verbal instruction on various topics (see comments)   Knowledge Questionnaire Score:  Knowledge Questionnaire Score - 03/12/22 1549       Knowledge Questionnaire Score   Pre Score Patient didn't have time to fill out in orientaiton, knowledge quiz sent home for patient to complete and bring back.             Core Components/Risk Factors/Patient Goals at Admission:  Personal Goals and Risk Factors at Admission - 03/12/22 1547       Core Components/Risk Factors/Patient Goals on Admission    Weight Management Yes    Intervention Weight Management: Provide education and appropriate resources to help participant work on and attain dietary goals.;Weight Management: Develop a combined nutrition and exercise program designed to reach desired caloric intake, while maintaining appropriate intake of nutrient and fiber, sodium and fats, and appropriate energy expenditure required for the weight goal.    Admit Weight 173 lb 4.8 oz (78.6 kg)    Goal Weight: Short Term 165 lb (74.8 kg)    Goal Weight: Long Term 155 lb (70.3 kg)    Expected Outcomes Short Term: Continue to assess and modify interventions  until short term weight is achieved;Long Term: Adherence to nutrition and physical activity/exercise program aimed toward attainment of established weight goal;Weight Loss: Understanding of general recommendations for a balanced deficit meal plan, which promotes 1-2 lb weight loss per week and includes a negative energy balance of [phone] kcal/d    Tobacco Cessation Yes    Number of packs per day 1/2 pack  a day   Dylana is a current tobacco user. Intervention for tobacco cessation was provided at the initial medical review. She was asked about readiness to quit and reported that she has not set a quit date. She has weaned down to under a pack a day and is working towards a quit.   . Patient was advised and educated about tobacco cessation using combination therapy, tobacco cessation classes, quit line, and quit smoking apps. Patient demonstrated understanding of this material. Staff will continue to provide encouragement and follow up with the patient throughout the program.  She has already reached out to the [facility] Quitline and received patches. She has used some patches.    Intervention Assist the participant in steps to quit. Provide individualized education and counseling about committing to Tobacco Cessation, relapse prevention, and pharmacological support that can be provided by physician.;Advice worker, assist with locating and accessing local/national Quit Smoking programs, and support quit date choice.    Expected Outcomes Short Term: Will demonstrate readiness to quit, by selecting a quit date.;Short Term: Will quit all tobacco product use, adhering to prevention of relapse plan.;Long Term: Complete abstinence from all tobacco products for at least 12 months from quit date.    Diabetes Yes    Intervention Provide education about signs/symptoms and action to take for hypo/hyperglycemia.;Provide education about proper nutrition, including hydration, and aerobic/resistive exercise  prescription along with prescribed medications to achieve blood glucose in normal ranges: Fasting glucose 65-99 mg/dL    Expected Outcomes Short Term: Participant verbalizes understanding of the signs/symptoms and immediate care of hyper/hypoglycemia, proper foot care and importance of medication, aerobic/resistive exercise and nutrition plan for blood glucose  control.;Long Term: Attainment of HbA1C < 7%.    Heart Failure Yes    Intervention Provide a combined exercise and nutrition program that is supplemented with education, support and counseling about heart failure. Directed toward relieving symptoms such as shortness of breath, decreased exercise tolerance, and extremity edema.    Expected Outcomes Improve functional capacity of life;Short term: Attendance in program 2-3 days a week with increased exercise capacity. Reported lower sodium intake. Reported increased fruit and vegetable intake. Reports medication compliance.;Short term: Daily weights obtained and reported for increase. Utilizing diuretic protocols set by physician.;Long term: Adoption of self-care skills and reduction of barriers for early signs and symptoms recognition and intervention leading to self-care maintenance.             Education:Diabetes - Individual verbal and written instruction to review signs/symptoms of diabetes, desired ranges of glucose level fasting, after meals and with exercise. Acknowledge that pre and post exercise glucose checks will be done for 3 sessions at entry of program. [place] from 03/27/2022 in [facility] Cardiac and Pulmonary Rehab  Date 03/12/22  Educator [facility]  Instruction Review Code 1- Verbalizes Understanding       Core Components/Risk Factors/Patient Goals Review:   Goals and Risk Factor Review     Row Name 03/27/22 1124             Core Components/Risk Factors/Patient Goals Review   Personal Goals Review Weight Management/Obesity;Diabetes       Review Felcia  has been doing well in class and states that she would like to lose a little weight. Her blood sugars have been in check and she has been checking them at home. She is off insulin since she is on Jardiance now.       Expected Outcomes Short: lose some weight. Long: maintain weight loss independently.                Core Components/Risk Factors/Patient Goals at Discharge (Final Review):   Goals and Risk Factor Review - 03/27/22 1124       Core Components/Risk Factors/Patient Goals Review   Personal Goals Review Weight Management/Obesity;Diabetes    Review Wally has been doing well in class and states that she would like to lose a little weight. Her blood sugars have been in check and she has been checking them at home. She is off insulin since she is on Jardiance now.    Expected Outcomes Short: lose some weight. Long: maintain weight loss independently.             ITP Comments:  ITP Comments     Row Name 03/04/22 1100 03/12/22 1529 03/20/22 1320 04/09/22 0827     ITP Comments Virtual orientation call completed today. shehas an appointment on Date: 03/12/2022  for EP eval and gym Orientation.  Documentation of diagnosis can be found in [facility] Date: 11/05/2021 .     Amariona is a current tobacco user. Intervention for tobacco cessation was provided at the initial medical review. She was asked about readiness to quit and reported that she has not set a quit date. She has weaned down to under a pack a day and is working towards a quit.   . Patient was advised and educated about tobacco cessation using combination therapy, tobacco cessation classes, quit line, and quit smoking apps. Patient demonstrated understanding of this material. Staff will continue to provide encouragement and follow up with the patient throughout the program.  She has already reached out to  the [place] Quitline and received patches. She has used some patches. Completed 6MWT and gym orientation. Initial ITP created and sent  for review to Dr. Emily Filbert, Medical Director.Rubby  is a current tobacco user. Intervention for tobacco cessation was provided at the initial medical review. She was asked about readiness to quit and reported she has called the quit line and is working with the resources they have given her but does not currently have a quit date.  . Patient was advised and educated about tobacco cessation using combination therapy, tobacco cessation classes, quit line, and quit smoking apps. Patient demonstrated understanding of this material. Staff will continue to provide encouragement and follow up with the patient throughout the program. First full day of exercise!  Patient was oriented to gym and equipment including functions, settings, policies, and procedures.  Patient's individual exercise prescription and treatment plan were reviewed.  All starting workloads were established based on the results of the 6 minute walk test done at initial orientation visit.  The plan for exercise progression was also introduced and progression will be customized based on patient's performance and goals. 30 Day review completed. Medical Director ITP review done, changes made as directed, and signed approval by Medical Director.   New to program             Comments:

## 2022-04-10 ENCOUNTER — Encounter: Payer: Medicare Other | Admitting: *Deleted

## 2022-04-10 DIAGNOSIS — I5022 Chronic systolic (congestive) heart failure: Secondary | ICD-10-CM

## 2022-04-10 NOTE — Progress Notes (Signed)
Daily Session Note  Patient Details  Name: Shannon [name] MRN: [id number] Date of Birth: 1955/11/04 Referring Provider:   Flowsheet Row Cardiac Rehab from 03/12/2022 in [facility] Cardiac and Pulmonary Rehab  Referring Provider C. Deen       Encounter Date: 04/10/2022  Check In:  Session Check In - 04/10/22 1148       Check-In   Supervising physician immediately available to respond to emergencies See telemetry face sheet for immediately available ER MD    Location [facility]-Cardiac & Pulmonary Rehab    Staff Present Heath Lark, RN, BSN, CCRP;Joseph Lavina, RCP,RRT,BSRT;Noah Tickle, [place], Exercise Physiologist;Meredith Sherryll Burger, RN BSN    Virtual Visit No    Medication changes reported     No    Fall or balance concerns reported    No    Warm-up and Cool-down Performed on first and last piece of equipment    Resistance Training Performed Yes    VAD Patient? No    PAD/SET Patient? No      PAD/SET Patient   Completed foot check today? No      Pain Assessment   Currently in Pain? No/denies                Social History   Tobacco Use  Smoking Status Every Day   Packs/day: 0.50   Years: 30.00   Total pack years: 15.00   Types: Cigarettes  Smokeless Tobacco Never  Tobacco Comments   Aware of need to quit  has decreased number of cigarettes per day. Has reached out to [facility] Quitline and has patches. No quit date yet.     Goals Met:  Independence with exercise equipment Exercise tolerated well No report of concerns or symptoms today  Goals Unmet:  Not Applicable  Comments: Pt able to follow exercise prescription today without complaint.  Will continue to monitor for progression.    Dr. Emily Filbert is Medical Director for [place].  Dr. Ottie Glazier is Medical Director for [facility] Pulmonary Rehabilitation.

## 2022-04-15 ENCOUNTER — Encounter: Payer: Medicare Other | Attending: Family Medicine | Admitting: *Deleted

## 2022-04-15 DIAGNOSIS — I5022 Chronic systolic (congestive) heart failure: Secondary | ICD-10-CM | POA: Insufficient documentation

## 2022-04-15 NOTE — Progress Notes (Signed)
Daily Session Note  Patient Details  Name: MARLYCE MCDOUGALD MRN: [id number] Date of Birth: 13-Dec-1955 Referring Provider:   Flowsheet Row Cardiac Rehab from 03/12/2022 in [facility] Cardiac and Pulmonary Rehab  Referring Provider C. Deen       Encounter Date: 04/15/2022  Check In:  Session Check In - 04/15/22 1123       Check-In   Supervising physician immediately available to respond to emergencies See telemetry face sheet for immediately available ER MD    Location [facility]-Cardiac & Pulmonary Rehab    Staff Present Antionette Fairy, BS, Exercise Physiologist;Jessica Crewe, MA, RCEP, CCRP, CCET;Wilson Sample Sherryll Burger, RN BSN    Virtual Visit No    Medication changes reported     No    Fall or balance concerns reported    No    Tobacco Cessation No Change    Current number of cigarettes/nicotine per day     7    Warm-up and Cool-down Performed on first and last piece of equipment    Resistance Training Performed Yes    VAD Patient? No    PAD/SET Patient? No      Pain Assessment   Currently in Pain? No/denies                Social History   Tobacco Use  Smoking Status Every Day   Packs/day: 0.50   Years: 30.00   Total pack years: 15.00   Types: Cigarettes  Smokeless Tobacco Never  Tobacco Comments   Aware of need to quit  has decreased number of cigarettes per day. Has reached out to [facility] Quitline and has patches. No quit date yet.     Goals Met:  Independence with exercise equipment Exercise tolerated well No report of concerns or symptoms today Strength training completed today  Goals Unmet:  Not Applicable  Comments: Pt able to follow exercise prescription today without complaint.  Will continue to monitor for progression.    Dr. Emily Filbert is Medical Director for [place].  Dr. Ottie Glazier is Medical Director for [facility] Pulmonary Rehabilitation.

## 2022-04-17 ENCOUNTER — Encounter: Payer: Medicare Other | Admitting: *Deleted

## 2022-04-17 DIAGNOSIS — I5022 Chronic systolic (congestive) heart failure: Secondary | ICD-10-CM | POA: Diagnosis not present

## 2022-04-17 NOTE — Progress Notes (Signed)
Completed initial RD consultation ?

## 2022-04-17 NOTE — Progress Notes (Signed)
Daily Session Note  Patient Details  Name: Shannon [name] MRN: [id number] Date of Birth: 1956-05-05 Referring Provider:   Flowsheet Row Cardiac Rehab from 03/12/2022 in [facility] Cardiac and Pulmonary Rehab  Referring Provider C. Deen       Encounter Date: 04/17/2022  Check In:  Session Check In - 04/17/22 1110       Check-In   Supervising physician immediately available to respond to emergencies See telemetry face sheet for immediately available ER MD    Location [facility]-Cardiac & Pulmonary Rehab    Staff Present Renita Papa, RN BSN;Jessica [place], MA, RCEP, CCRP, Bertram Gala, MS, ACSM CEP, Exercise Physiologist;Joseph Tessie Fass, [place]    Virtual Visit No    Medication changes reported     No    Fall or balance concerns reported    No    Tobacco Cessation No Change    Current number of cigarettes/nicotine per day     7    Warm-up and Cool-down Performed on first and last piece of equipment    Resistance Training Performed Yes    VAD Patient? No    PAD/SET Patient? No      Pain Assessment   Currently in Pain? No/denies                Social History   Tobacco Use  Smoking Status Every Day   Packs/day: 0.50   Years: 30.00   Total pack years: 15.00   Types: Cigarettes  Smokeless Tobacco Never  Tobacco Comments   Aware of need to quit  has decreased number of cigarettes per day. Has reached out to [facility] Quitline and has patches. No quit date yet.     Goals Met:  Independence with exercise equipment Exercise tolerated well No report of concerns or symptoms today Strength training completed today  Goals Unmet:  Not Applicable  Comments: Pt able to follow exercise prescription today without complaint.  Will continue to monitor for progression.    Dr. Emily Filbert is Medical Director for [place].  Dr. Ottie Glazier is Medical Director for [facility] Pulmonary Rehabilitation.

## 2022-04-21 DIAGNOSIS — M8580 Other specified disorders of bone density and structure, unspecified site: Secondary | ICD-10-CM | POA: Diagnosis not present

## 2022-04-21 DIAGNOSIS — Z4502 Encounter for adjustment and management of automatic implantable cardiac defibrillator: Secondary | ICD-10-CM | POA: Diagnosis not present

## 2022-04-21 DIAGNOSIS — I5020 Unspecified systolic (congestive) heart failure: Secondary | ICD-10-CM | POA: Diagnosis not present

## 2022-04-21 DIAGNOSIS — Z7901 Long term (current) use of anticoagulants: Secondary | ICD-10-CM | POA: Diagnosis not present

## 2022-04-21 DIAGNOSIS — I480 Paroxysmal atrial fibrillation: Secondary | ICD-10-CM | POA: Diagnosis not present

## 2022-04-21 DIAGNOSIS — Z95810 Presence of automatic (implantable) cardiac defibrillator: Secondary | ICD-10-CM | POA: Diagnosis not present

## 2022-04-21 DIAGNOSIS — E119 Type 2 diabetes mellitus without complications: Secondary | ICD-10-CM | POA: Diagnosis not present

## 2022-04-21 DIAGNOSIS — K7460 Unspecified cirrhosis of liver: Secondary | ICD-10-CM | POA: Diagnosis not present

## 2022-04-21 DIAGNOSIS — K766 Portal hypertension: Secondary | ICD-10-CM | POA: Diagnosis not present

## 2022-04-21 DIAGNOSIS — Z7952 Long term (current) use of systemic steroids: Secondary | ICD-10-CM | POA: Diagnosis not present

## 2022-04-21 DIAGNOSIS — Z79899 Other long term (current) drug therapy: Secondary | ICD-10-CM | POA: Diagnosis not present

## 2022-04-22 ENCOUNTER — Encounter: Payer: Medicare Other | Admitting: *Deleted

## 2022-04-22 DIAGNOSIS — I5022 Chronic systolic (congestive) heart failure: Secondary | ICD-10-CM | POA: Diagnosis not present

## 2022-04-22 NOTE — Progress Notes (Signed)
Daily Session Note  Patient Details  Name: HALEY FUERSTENBERG MRN: [id number] Date of Birth: Nov 12, 1955 Referring Provider:   Flowsheet Row Cardiac Rehab from 03/12/2022 in [facility] Cardiac and Pulmonary Rehab  Referring Provider C. Deen       Encounter Date: 04/22/2022  Check In:  Session Check In - 04/22/22 1109       Check-In   Supervising physician immediately available to respond to emergencies See telemetry face sheet for immediately available ER MD    Location [facility]-Cardiac & Pulmonary Rehab    Staff Present Antionette Fairy, BS, Exercise Physiologist;Jessica Maybeury, MA, RCEP, CCRP, CCET;Galdino Hinchman Whitakers, RN Abel Presto, MS, ACSM CEP, Exercise Physiologist    Virtual Visit No    Medication changes reported     Yes    Comments increased metoprolol    Fall or balance concerns reported    No    Tobacco Cessation No Change    Current number of cigarettes/nicotine per day     7    Warm-up and Cool-down Performed on first and last piece of equipment    Resistance Training Performed Yes    VAD Patient? No    PAD/SET Patient? No      Pain Assessment   Currently in Pain? No/denies                Social History   Tobacco Use  Smoking Status Every Day   Packs/day: 0.50   Years: 30.00   Total pack years: 15.00   Types: Cigarettes  Smokeless Tobacco Never  Tobacco Comments   Aware of need to quit  has decreased number of cigarettes per day. Has reached out to [facility] Quitline and has patches. No quit date yet.     Goals Met:  Independence with exercise equipment Exercise tolerated well No report of concerns or symptoms today Strength training completed today  Goals Unmet:  Not Applicable  Comments: Pt able to follow exercise prescription today without complaint.  Will continue to monitor for progression.    Dr. Emily Filbert is Medical Director for [place].  Dr. Ottie Glazier is Medical Director for [facility] Pulmonary Rehabilitation.

## 2022-04-23 DIAGNOSIS — Z4502 Encounter for adjustment and management of automatic implantable cardiac defibrillator: Secondary | ICD-10-CM | POA: Diagnosis not present

## 2022-04-24 ENCOUNTER — Encounter: Payer: Medicare Other | Admitting: *Deleted

## 2022-04-24 DIAGNOSIS — I5022 Chronic systolic (congestive) heart failure: Secondary | ICD-10-CM | POA: Diagnosis not present

## 2022-04-24 NOTE — Progress Notes (Signed)
Daily Session Note  Patient Details  Name: Shannon [name] MRN: [id number] Date of Birth: [birth year] Referring Provider:   Flowsheet Row Cardiac Rehab from 03/12/2022 in [facility] Cardiac and Pulmonary Rehab  Referring Provider C. Deen       Encounter Date: 04/24/2022  Check In:  Session Check In - 04/24/22 1111       Check-In   Supervising physician immediately available to respond to emergencies See telemetry face sheet for immediately available ER MD    Location [facility]-Cardiac & Pulmonary Rehab    Staff Present Renita Papa, RN Odelia Gage, RN, ADN;Jessica Luan Pulling, MA, RCEP, CCRP, Bertram Gala, MS, ACSM CEP, Exercise Physiologist    Virtual Visit No    Medication changes reported     No    Fall or balance concerns reported    No    Tobacco Cessation No Change    Warm-up and Cool-down Performed on first and last piece of equipment    Resistance Training Performed Yes    VAD Patient? No    PAD/SET Patient? No      Pain Assessment   Currently in Pain? No/denies                Social History   Tobacco Use  Smoking Status Every Day   Packs/day: 0.50   Years: 30.00   Total pack years: 15.00   Types: Cigarettes  Smokeless Tobacco Never  Tobacco Comments   Aware of need to quit  has decreased number of cigarettes per day. Has reached out to [facility] Quitline and has patches. No quit date yet.     Goals Met:  Independence with exercise equipment Exercise tolerated well No report of concerns or symptoms today Strength training completed today  Goals Unmet:  Not Applicable  Comments: Pt able to follow exercise prescription today without complaint.  Will continue to monitor for progression.    Dr. Emily Filbert is Medical Director for [place].  Dr. Ottie Glazier is Medical Director for [facility] Pulmonary Rehabilitation.

## 2022-04-29 ENCOUNTER — Encounter: Payer: Medicare Other | Admitting: *Deleted

## 2022-04-29 DIAGNOSIS — I5022 Chronic systolic (congestive) heart failure: Secondary | ICD-10-CM

## 2022-04-29 NOTE — Progress Notes (Signed)
Daily Session Note  Patient Details  Name: Shannon [name] MRN: [id number] Date of Birth: [birth year] Referring Provider:   Flowsheet Row Cardiac Rehab from 03/12/2022 in [facility] Cardiac and Pulmonary Rehab  Referring Provider C. Deen       Encounter Date: 04/29/2022  Check In:  Session Check In - 04/29/22 1105       Check-In   Supervising physician immediately available to respond to emergencies See telemetry face sheet for immediately available ER MD    Location [facility]-Cardiac & Pulmonary Rehab    Staff Present Renita Papa, RN BSN;Jessica [place], MA, RCEP, CCRP, Bertram Gala, MS, ACSM CEP, Exercise Physiologist    Virtual Visit No    Medication changes reported     No    Fall or balance concerns reported    No    Tobacco Cessation No Change    Current number of cigarettes/nicotine per day     7    Warm-up and Cool-down Performed on first and last piece of equipment    Resistance Training Performed Yes    VAD Patient? No    PAD/SET Patient? No      Pain Assessment   Currently in Pain? No/denies                Social History   Tobacco Use  Smoking Status Every Day   Packs/day: 0.50   Years: 30.00   Total pack years: 15.00   Types: Cigarettes  Smokeless Tobacco Never  Tobacco Comments   Aware of need to quit  has decreased number of cigarettes per day. Has reached out to [facility] Quitline and has patches. No quit date yet.     Goals Met:  Independence with exercise equipment Exercise tolerated well No report of concerns or symptoms today Strength training completed today  Goals Unmet:  Not Applicable  Comments: Pt able to follow exercise prescription today without complaint.  Will continue to monitor for progression.    Dr. Emily Filbert is Medical Director for [place].  Dr. Ottie Glazier is Medical Director for [facility] Pulmonary Rehabilitation.

## 2022-05-07 ENCOUNTER — Encounter: Payer: Self-pay | Admitting: *Deleted

## 2022-05-07 DIAGNOSIS — I5022 Chronic systolic (congestive) heart failure: Secondary | ICD-10-CM

## 2022-05-07 NOTE — Progress Notes (Signed)
Cardiac Individual Treatment Plan  Patient Details  Name: Shannon [name] MRN: [id number] Date of Birth: [birth year] Referring Provider:   Flowsheet Row Cardiac Rehab from 03/12/2022 in [facility] Cardiac and Pulmonary Rehab  Referring Provider C. Deen       Initial Encounter Date:  Flowsheet Row Cardiac Rehab from 03/12/2022 in [facility] Cardiac and Pulmonary Rehab  Date 03/12/22       Visit Diagnosis: Heart failure, chronic systolic (HCC)  Patient's Home Medications on Admission:  Current Outpatient Medications:    acetaminophen (TYLENOL) 650 MG CR tablet, Take by mouth., Disp: , Rfl:    albuterol (VENTOLIN HFA) 108 (90 Base) MCG/ACT inhaler, USE 1 TO 2 INHALATIONS BY  MOUTH INTO THE LUNGS EVERY  6 HOURS AS NEEDED FOR  WHEEZING OR SHORTNESS OF  BREATH, Disp: 34 g, Rfl: 2   apixaban (ELIQUIS) 5 MG TABS tablet, Take 1 tablet (5 mg total) by mouth 2 (two) times daily., Disp: , Rfl: 0   azaTHIOprine (IMURAN) 50 MG tablet, Take 4 tablets (200 mg total) by mouth daily., Disp: , Rfl:    blood glucose meter kit and supplies KIT, Dispense based on patient and insurance preference. Use up to four times daily as directed. (FOR ICD-9 250.00, 250.01)., Disp: 1 each, Rfl: 0   blood glucose meter kit and supplies, Dispense based on patient and insurance preference. Use up to four times daily as directed. (FOR ICD-10 E10.9, E11.9)., Disp: 1 each, Rfl: 0   Blood Glucose Monitoring Suppl (ONE TOUCH ULTRA SYSTEM KIT) W/DEVICE KIT, Test blood sugar as directed Dx E09.9, Disp: 1 each, Rfl: 0   celecoxib (CELEBREX) 100 MG capsule, Take 100 mg by mouth daily., Disp: , Rfl:    Cholecalciferol (VITAMIN D3) 5000 units TABS, Take by mouth. , Disp: , Rfl:    empagliflozin (JARDIANCE) 25 MG TABS tablet, Take 1 tablet (25 mg total) by mouth daily before breakfast., Disp: , Rfl:    furosemide (LASIX) 40 MG tablet, Take 1 tablet (40 mg total) by mouth daily., Disp: , Rfl: 0   glucose blood (ONETOUCH VERIO) test strip, 1  each by Other route 4 (four) times daily. Use as instructed, Disp: 400 strip, Rfl: 3   insulin degludec (TRESIBA FLEXTOUCH) 100 UNIT/ML FlexTouch Pen, Inject 10 Units into the skin daily. (Patient not taking: Reported on 03/04/2022), Disp: 15 mL, Rfl: 11   Insulin Pen Needle (PEN NEEDLES) 31G X 6 MM MISC, Use with Tyler Aas as instructed daily (Patient not taking: Reported on 03/04/2022), Disp: 100 each, Rfl: 11   lactulose (CHRONULAC) 10 GM/15ML solution, TAKE 30 ML BY MOUTH 4 TIMES DAILY AS NEEDED (Patient taking differently: Take 30 g by mouth. 50-60 g 1x per day not 30 g qid), Disp: 10800 mL, Rfl: 3   lidocaine (LIDODERM) 5 %, Place 1 patch onto the skin daily. Remove & Discard patch within 12 hours or as directed by MD, Disp: 30 patch, Rfl: 5   loratadine (CLARITIN) 10 MG tablet, Take 10 mg by mouth every other day as needed for allergies., Disp: , Rfl:    losartan (COZAAR) 25 MG tablet, Take 1 tablet by mouth daily., Disp: , Rfl:    Magnesium Oxide 500 MG TABS, Take by mouth., Disp: , Rfl:    metFORMIN (GLUCOPHAGE-XR) 500 MG 24 hr tablet, TAKE 2 TABLETS BY MOUTH TWICE  DAILY WITH A MEAL, Disp: 400 tablet, Rfl: 2   metoprolol tartrate (LOPRESSOR) 50 MG tablet, Take 1 tablet (50 mg total) by mouth  2 (two) times daily., Disp: , Rfl: 0   miconazole (MICONAZOLE 3) 200 MG vaginal suppository, Place 1 suppository (200 mg total) vaginally at bedtime., Disp: 3 suppository, Rfl: 0   nystatin (MYCOSTATIN) 100000 UNIT/ML suspension, Take 5 mLs (500,000 Units total) by mouth 4 (four) times daily. Hold and spit (Patient not taking: Reported on 03/04/2022), Disp: 473 mL, Rfl: 0   omeprazole (PRILOSEC) 40 MG capsule, TAKE 1 CAPSULE BY MOUTH  DAILY 1/2 HOUR BEFORE A  MEAL, Disp: 90 capsule, Rfl: 3   predniSONE (DELTASONE) 1 MG tablet, Take 3 mg by mouth daily., Disp: , Rfl:    Simethicone 180 MG CAPS, Take by mouth., Disp: , Rfl:    spironolactone (ALDACTONE) 25 MG tablet, Take 1 tablet (25 mg total) by mouth  daily., Disp: , Rfl: 0   traMADol (ULTRAM) 50 MG tablet, Take by mouth., Disp: , Rfl:    traZODone (DESYREL) 50 MG tablet, Take 25 mg by mouth at bedtime as needed., Disp: , Rfl:    vitamin B-12 (CYANOCOBALAMIN) 100 MCG tablet, Take 100 mcg by mouth daily., Disp: , Rfl:   Past Medical History: Past Medical History:  Diagnosis Date   Abdominal bloating 08/16/2013   Adjustment disorder 11/04/2010   Asthma    Atrial fibrillation with RVR ([place])    as of 11/2021   Autoimmune hepatitis ([place])    Bradycardia    Depression    Diabetes mellitus without complication ([place])    Dilated cardiomyopathy ([place])    DUB (dysfunctional uterine bleeding)    2008 s/p hysterectomy   Elevated liver enzymes 04/11/2020   Exposure to TB 2005   tx'ed x 6 months   Fatigue 01/23/2015   Gallstones    Gastric ulcer    GERD (gastroesophageal reflux disease)    HFrEF (heart failure with reduced ejection fraction) ([place])    unc cards as of 11/2021   History of asthma 02/26/2018   History of chicken pox    Hx of migraines    Leukopenia 04/11/2020   Liver disease    very low liver function, auto-immune hepatitis   Motion sickness    Osteoarthritis    Systemic lupus erythematosus ([place]) 05/31/2010   Qualifier: Diagnosis of  By: Deborra Medina MD, Talia     Vitamin D deficiency     Tobacco Use: Social History   Tobacco Use  Smoking Status Every Day   Packs/day: 0.50   Years: 30.00   Total pack years: 15.00   Types: Cigarettes  Smokeless Tobacco Never  Tobacco Comments   Aware of need to quit  has decreased number of cigarettes per day. Has reached out to [facility] Quitline and has patches. No quit date yet.     Labs: Review Flowsheet  More data exists      Latest Ref Rng & Units 04/28/2019 07/25/2020 11/27/2020 03/20/2021 12/18/2021  Labs for ITP Cardiac and Pulmonary Rehab  Cholestrol 100 - 199 mg/dL 193  131  123  159  -  LDL (calc) 0 - 99 mg/dL 114  65  60  78  -  Direct LDL mg/dL - - - - 60.0   HDL-C >39 mg/dL 67   56  48.30  69  -  Trlycerides 0 - 149 mg/dL 66  39  73.0  58  -  Hemoglobin A1c 4.6 - 6.5 % 6.5  6.1  - 6.2  6.8      Exercise Target Goals: Exercise Program Goal: Individual exercise prescription set using results  from initial 6 min walk test and THRR while considering  patient's activity barriers and safety.   Exercise Prescription Goal: Initial exercise prescription builds to 30-45 minutes a day of aerobic activity, 2-3 days per week.  Home exercise guidelines will be given to patient during program as part of exercise prescription that the participant will acknowledge.   Education: Aerobic Exercise: - Group verbal and visual presentation on the components of exercise prescription. Introduces F.I.T.T principle from ACSM for exercise prescriptions.  Reviews F.I.T.T. principles of aerobic exercise including progression. Written material given at graduation. Flowsheet Row Cardiac Rehab from 03/27/2022 in [facility] Cardiac and Pulmonary Rehab  Date 03/20/22  Educator [facility]  Instruction Review Code 1- Verbalizes Understanding       Education: Resistance Exercise: - Group verbal and visual presentation on the components of exercise prescription. Introduces F.I.T.T principle from ACSM for exercise prescriptions  Reviews F.I.T.T. principles of resistance exercise including progression. Written material given at graduation.    Education: Exercise & Equipment Safety: - Individual verbal instruction and demonstration of equipment use and safety with use of the equipment. Flowsheet Row Cardiac Rehab from 03/27/2022 in [facility] Cardiac and Pulmonary Rehab  Date 03/12/22  Educator [facility]  Instruction Review Code 1- Verbalizes Understanding       Education: Exercise Physiology & General Exercise Guidelines: - Group verbal and written instruction with models to review the exercise physiology of the cardiovascular system and associated critical values. Provides general exercise guidelines with specific  guidelines to those with heart or lung disease.    Education: Flexibility, Balance, Mind/Body Relaxation: - Group verbal and visual presentation with interactive activity on the components of exercise prescription. Introduces F.I.T.T principle from ACSM for exercise prescriptions. Reviews F.I.T.T. principles of flexibility and balance exercise training including progression. Also discusses the mind body connection.  Reviews various relaxation techniques to help reduce and manage stress (i.e. Deep breathing, progressive muscle relaxation, and visualization). Balance handout provided to take home. Written material given at graduation.   Activity Barriers & Risk Stratification:  Activity Barriers & Cardiac Risk Stratification - 03/04/22 1036       Activity Barriers & Cardiac Risk Stratification   Activity Barriers Back Problems   chronic pain, knows her limits   Cardiac Risk Stratification High             6 Minute Walk:  6 Minute Walk     Row Name 03/12/22 1530         6 Minute Walk   Phase Initial     Distance 1265 feet     Walk Time 6 minutes     # of Rest Breaks 0     MPH 2.4     METS 3.03     RPE 11     Perceived Dyspnea  1     VO2 Peak 10.61     Symptoms Yes (comment)     Comments left leg joint pain during test 5/10 pain     Resting HR 71 bpm     Resting BP 110/64     Resting Oxygen Saturation  98 %     Exercise Oxygen Saturation  during 6 min walk 98 %     Max Ex. HR 90 bpm     Max Ex. BP 124/72     2 Minute Post BP 114/70              Oxygen Initial Assessment:   Oxygen Re-Evaluation:   Oxygen Discharge (Final Oxygen Re-Evaluation):  Initial Exercise Prescription:  Initial Exercise Prescription - 03/12/22 1500       Date of Initial Exercise RX and Referring Provider   Date 03/12/22    Referring Provider C. Deen      Oxygen   Maintain Oxygen Saturation 88% or higher      Treadmill   MPH 2.2    Grade 1    Minutes 15    METs 2.99       NuStep   Level 2    SPM 80    Minutes 15    METs 3.03      REL-XR   Level 1    Speed 50    Minutes 5    METs 3.03      Biostep-RELP   Level 1    SPM 50    Minutes 15    METs 3.03      Prescription Details   Frequency (times per week) 2    Duration Progress to 30 minutes of continuous aerobic without signs/symptoms of physical distress      Intensity   THRR 40-80% of Max Heartrate 105-139    Ratings of Perceived Exertion 11-13    Perceived Dyspnea 0-4      Progression   Progression Continue to progress workloads to maintain intensity without signs/symptoms of physical distress.      Resistance Training   Training Prescription Yes    Weight 2    Reps 10-15             Perform Capillary Blood Glucose checks as needed.  Exercise Prescription Changes:   Exercise Prescription Changes     Row Name 03/12/22 1500 03/24/22 1600 04/09/22 1300 04/22/22 1500 04/29/22 1200     Response to Exercise   Blood Pressure (Admit) 110/64 122/62 106/72 108/60 --   Blood Pressure (Exercise) 124/72 140/72 148/86 124/68 --   Blood Pressure (Exit) 114/70 118/62 108/60 102/62 --   Heart Rate (Admit) 71 bpm 72 bpm 77 bpm 75 bpm --   Heart Rate (Exercise) 90 bpm 121 bpm 117 bpm 121 bpm --   Heart Rate (Exit) 69 bpm 77 bpm 84 bpm 78 bpm --   Oxygen Saturation (Admit) 98 % -- -- -- --   Oxygen Saturation (Exercise) 98 % -- -- -- --   Oxygen Saturation (Exit) 100 % -- -- -- --   Rating of Perceived Exertion (Exercise) _0 --   Perceived Dyspnea (Exercise) 1 -- -- -- --   Symptoms left leg joint pain 5/10 none none none --   Comments 6 MWT results 1st full day of exercise -- -- --   Duration -- Progress to 30 minutes of  aerobic without signs/symptoms of physical distress Continue with 30 min of aerobic exercise without signs/symptoms of physical distress. Continue with 30 min of aerobic exercise without signs/symptoms of physical distress. --   Intensity -- THRR unchanged  THRR unchanged THRR unchanged --     Progression   Progression -- Continue to progress workloads to maintain intensity without signs/symptoms of physical distress. Continue to progress workloads to maintain intensity without signs/symptoms of physical distress. Continue to progress workloads to maintain intensity without signs/symptoms of physical distress. --   Average METs -- 2.68 2.43 2.84 --     Resistance Training   Training Prescription -- Yes Yes Yes --   Weight -- 2 lb 2 lb 2 lb --   Reps -- 10-15 10-15 10-15 --  Interval Training   Interval Training -- No No No --     Treadmill   MPH -- -- 2 -- --   Grade -- -- 1 -- --   Minutes -- -- 15 -- --   METs -- -- 2.81 -- --     Recumbant Bike   Level -- -- -- 3 --   Watts -- -- -- 14 --   Minutes -- -- -- 15 --   METs -- -- -- 2.57 --     REL-XR   Level -- -- 3 2 --   Minutes -- -- 15 15 --   METs -- -- -- 3.6 --     Biostep-RELP   Level -- _0 --   Minutes -- _1 --   METs -- _2 --     Track   Laps -- _3 --   Minutes -- _4 --   METs -- 2.36 2.63 2.63 --     Home Exercise Plan   Plans to continue exercise at -- -- -- -- Home (comment)  Pt. plans to walk at home and get her own recumbent bike. She also has hand weights at home that she uses for resistance training.   Frequency -- -- -- -- Add 3 additional days to program exercise sessions.   Initial Home Exercises Provided -- -- -- -- 04/29/22     Oxygen   Maintain Oxygen Saturation -- 88% or higher -- 88% or higher --            Exercise Comments:   Exercise Comments     Row Name 03/20/22 1321           Exercise Comments First full day of exercise!  Patient was oriented to gym and equipment including functions, settings, policies, and procedures.  Patient's individual exercise prescription and treatment plan were reviewed.  All starting workloads were established based on the results of the 6 minute walk test done at initial  orientation visit.  The plan for exercise progression was also introduced and progression will be customized based on patient's performance and goals.                Exercise Goals and Review:   Exercise Goals     Row Name 03/12/22 1540             Exercise Goals   Increase Physical Activity Yes       Intervention Develop an individualized exercise prescription for aerobic and resistive training based on initial evaluation findings, risk stratification, comorbidities and participant's personal goals.;Provide advice, education, support and counseling about physical activity/exercise needs.       Expected Outcomes Short Term: Attend rehab on a regular basis to increase amount of physical activity.;Long Term: Add in home exercise to make exercise part of routine and to increase amount of physical activity.;Long Term: Exercising regularly at least 3-5 days a week.       Increase Strength and Stamina Yes       Intervention Provide advice, education, support and counseling about physical activity/exercise needs.;Develop an individualized exercise prescription for aerobic and resistive training based on initial evaluation findings, risk stratification, comorbidities and participant's personal goals.       Expected Outcomes Short Term: Increase workloads from initial exercise prescription for resistance, speed, and METs.;Short Term: Perform resistance training exercises routinely during rehab and add in resistance training at home;Long Term: Improve cardiorespiratory fitness, muscular  endurance and strength as measured by increased METs and functional capacity (6MWT)       Able to understand and use rate of perceived exertion (RPE) scale Yes       Intervention Provide education and explanation on how to use RPE scale       Expected Outcomes Short Term: Able to use RPE daily in rehab to express subjective intensity level;Long Term:  Able to use RPE to guide intensity level when exercising  independently       Able to understand and use Dyspnea scale Yes       Intervention Provide education and explanation on how to use Dyspnea scale       Expected Outcomes Short Term: Able to use Dyspnea scale daily in rehab to express subjective sense of shortness of breath during exertion;Long Term: Able to use Dyspnea scale to guide intensity level when exercising independently       Knowledge and understanding of Target Heart Rate Range (THRR) Yes       Intervention Provide education and explanation of THRR including how the numbers were predicted and where they are located for reference       Expected Outcomes Short Term: Able to state/look up THRR;Short Term: Able to use daily as guideline for intensity in rehab;Long Term: Able to use THRR to govern intensity when exercising independently       Able to check pulse independently Yes       Intervention Provide education and demonstration on how to check pulse in carotid and radial arteries.;Review the importance of being able to check your own pulse for safety during independent exercise       Expected Outcomes Short Term: Able to explain why pulse checking is important during independent exercise;Long Term: Able to check pulse independently and accurately       Understanding of Exercise Prescription Yes       Intervention Provide education, explanation, and written materials on patient's individual exercise prescription       Expected Outcomes Short Term: Able to explain program exercise prescription;Long Term: Able to explain home exercise prescription to exercise independently       Improve claudication pain toleration; Improve walking ability Yes       Intervention Participate in PAD/SET Rehab 2-3 days a week, walking at home as part of exercise prescription;Attend education sessions to aid in risk factor modification and understanding of disease process       Expected Outcomes Short Term: Improve walking distance/time to onset of claudication  pain;Long Term: Improve score of PAD questionnaires                Exercise Goals Re-Evaluation :  Exercise Goals Re-Evaluation     Row Name 03/20/22 1321 03/24/22 1611 04/09/22 1318 04/22/22 1520 04/24/22 1126     Exercise Goal Re-Evaluation   Exercise Goals Review Able to understand and use rate of perceived exertion (RPE) scale;Able to understand and use Dyspnea scale;Knowledge and understanding of Target Heart Rate Range (THRR);Understanding of Exercise Prescription Increase Physical Activity;Increase Strength and Stamina;Understanding of Exercise Prescription Increase Physical Activity;Increase Strength and Stamina;Understanding of Exercise Prescription Increase Physical Activity;Increase Strength and Stamina;Understanding of Exercise Prescription Increase Physical Activity;Increase Strength and Stamina;Understanding of Exercise Prescription   Comments Reviewed RPE scale, THR and program prescription with pt today.  Pt voiced understanding and was given a copy of goals to take home. Shannon [name] is off to a good start with rehab. She completed her 1st session and was able  to work at level 3 on [employer] and complete 25 laps on the track. We will continue to monitor patient as she progresses in the program. Shannon [name] is doing well in rehab. She recently improved to 30 laps on the track. She also has consistently walked at least 2 mph on the treadmill. She has also done well with level 3 on the XR. We will continue to monitor her progress in the program. Shannon [name] continues to do well in rehab. She did increase to level 3 on the Biostep and has been hitting her THR all sessions. She has been consistently walking 30 laps on the track and may benefit from increasing that number to therefore increase her walking speed. We will continue to monitor. Shannon [name] reports walking (20 minutes, RPE 11) and using 4 lbs weights and stretching. She reports her arthritis has been flaring up recently. EP has not reviewed  home exercise yet.   Expected Outcomes Short: Use RPE daily to regulate intensity. Long: Follow program prescription in THR. Short: Continue initial exercise prescription Long: Build up overall strength and stamina Short: Increase workload on the treadmill. Long: Continue to increase strength and stamina. Short: Increase laps on track beyond 30, strive for 35 Long: Continue to increase overall MET level Short: EP to go over home exercise Long: Continue to increase overall MET level    Row Name 04/29/22 1208             Exercise Goal Re-Evaluation   Exercise Goals Review Increase Physical Activity;Able to understand and use Dyspnea scale;Understanding of Exercise Prescription;Knowledge and understanding of Target Heart Rate Range (THRR);Increase Strength and Stamina;Able to understand and use rate of perceived exertion (RPE) scale;Able to check pulse independently       Comments Reviewed home exercise with pt today.  Pt plans to walk at home and get her own recumbent bike for exercise. She also has hand weights at home that she uses for resistance training. Reviewed THR, pulse, RPE, sign and symptoms, pulse oximetery and when to call 911 or MD.  Also discussed weather considerations and indoor options.  Pt voiced understanding.       Expected Outcomes Short: Continue to walk on days away from rehab and begin using recumbent bike at home. Long: Continue to exercise independently.                Discharge Exercise Prescription (Final Exercise Prescription Changes):  Exercise Prescription Changes - 04/29/22 1200       Home Exercise Plan   Plans to continue exercise at Home (comment)   Pt. plans to walk at home and get her own recumbent bike. She also has hand weights at home that she uses for resistance training.   Frequency Add 3 additional days to program exercise sessions.    Initial Home Exercises Provided 04/29/22             Nutrition:  Target Goals: Understanding of nutrition  guidelines, daily intake of sodium <1594m, cholesterol <2020m calories 30% from fat and 7% or less from saturated fats, daily to have 5 or more servings of fruits and vegetables.  Education: All About Nutrition: -Group instruction provided by verbal, written material, interactive activities, discussions, models, and posters to present general guidelines for heart healthy nutrition including fat, fiber, MyPlate, the role of sodium in heart healthy nutrition, utilization of the nutrition label, and utilization of this knowledge for meal planning. Follow up email sent as well. Written material given at graduation. Flowsheet Row  Cardiac Rehab from 03/27/2022 in [facility] Cardiac and Pulmonary Rehab  Date 03/27/22  Educator [facility]  Instruction Review Code 1- Verbalizes Understanding       Biometrics:  Pre Biometrics - 03/12/22 1542       Pre Biometrics   Height _0  (1.676 m)    Weight 173 lb 4.8 oz (78.6 kg)    Waist Circumference 39.5 inches    Hip Circumference 43 inches    Waist to Hip Ratio 0.92 %    BMI (Calculated) 27.98    Single Leg Stand 30 seconds              Nutrition Therapy Plan and Nutrition Goals:  Nutrition Therapy & Goals - 04/17/22 1318       Nutrition Therapy   Diet Heart healthy, low Na, T2DM MNT    Protein (specify units) 65-75g    Fiber 25 grams    Whole Grain Foods 3 servings    Saturated Fats 12 max. grams    Fruits and Vegetables 8 servings/day    Sodium 1.5 grams      Personal Nutrition Goals   Nutrition Goal ST: practice MyPlate guidelines, review paperwork, add nutrition to meals such as fiber, protein, and heart healthy fats LT: eat consistent meals/snacks with fiber, healthy fat, and protein.    Comments 66 y.o. F admitted to cardiac rehab for chronic systolic heart failure. PMHx includes cirrhosis of liver, hepatic encephalopathy, HTN, autoimmune hepatitis, gallstones, T2DM, OA, HLD. Last A1C 12/18/21 was 6.8. PYP Score: 59. Vegetables & Fruits 9/12.  Breads, Grains & Cereals 4/12. Red & Processed Meat 8/12. Poultry 2/2. Fish & Shellfish 3/4. Beans, Nuts & Seeds 1/4. Milk & Dairy Foods 2/6. Toppings, Oils, Seasonings & Salt 13/20. Sweets, Snacks & Restaurant Food 10/14. Beverages 7/10.  Shannon [name] reports that she has made changes to her diet to be more heart healthy. She reports that her appetite has changed and her taste has changed since taking her medications. She reports her BG has been running around 90; she has been eating more sweets she feels due to lower intake such as fruit such as bananas, apples, blueberries, cherrys. B: 5am-7am coffee (cream (powdered and liquid) with no sugar). 1030am (will eat something between 930 and 10 am when going to rehab) She no longer likes eggs, but she will have things like toast and applesauce and berries, sometimes scrambled eggs with something like tater tots, oatmeal with cinnamon and sugar, sometimes cold cereal like honey bunches of oats or rice crispies with banana or cheerios. S: pretzles - she has reduced chips and cheese D: 830 or 9pm sometimes: baked salmon, broccoli, cauliflower, cabbage, potato, pasta, and beans, as well as salad. She enjoys greek yogurt, but she gets tired of it quickly. Discussed ways that she can add in protein, healthy fat, and fiber. Reviewed heart healthy eating and T2DM MNT.      Intervention Plan   Intervention Prescribe, educate and counsel regarding individualized specific dietary modifications aiming towards targeted core components such as weight, hypertension, lipid management, diabetes, heart failure and other comorbidities.    Expected Outcomes Short Term Goal: Understand basic principles of dietary content, such as calories, fat, sodium, cholesterol and nutrients.;Short Term Goal: A plan has been developed with personal nutrition goals set during dietitian appointment.;Long Term Goal: Adherence to prescribed nutrition plan.             Nutrition  Assessments:  MEDIFICTS Score Key: ?70 Need to make dietary changes  40-70 Heart Healthy Diet ? 40 Therapeutic Level Cholesterol Diet  Flowsheet Row Cardiac Rehab from 03/12/2022 in [facility] Cardiac and Pulmonary Rehab  Picture Your Plate Total Score on Admission 59      Picture Your Plate Scores: <54 Unhealthy dietary pattern with much room for improvement. 41-50 Dietary pattern unlikely to meet recommendations for good health and room for improvement. 51-60 More healthful dietary pattern, with some room for improvement.  >60 Healthy dietary pattern, although there may be some specific behaviors that could be improved.    Nutrition Goals Re-Evaluation:  Nutrition Goals Re-Evaluation     Shannon [name] Name 03/27/22 1118             Goals   Current Weight 174 lb (78.9 kg)       Nutrition Goal Make an eating schedule       Comment Earlee states she knows what to do but has a sporadic eating schedule. Patient deferres dietitian.       Expected Outcome Short: get on an eating schedule. Long: maintain a diet plan that pertains to her.                Nutrition Goals Discharge (Final Nutrition Goals Re-Evaluation):  Nutrition Goals Re-Evaluation - 03/27/22 1118       Goals   Current Weight 174 lb (78.9 kg)    Nutrition Goal Make an eating schedule    Comment Jerrine states she knows what to do but has a sporadic eating schedule. Patient deferres dietitian.    Expected Outcome Short: get on an eating schedule. Long: maintain a diet plan that pertains to her.             Psychosocial: Target Goals: Acknowledge presence or absence of significant depression and/or stress, maximize coping skills, provide positive support system. Participant is able to verbalize types and ability to use techniques and skills needed for reducing stress and depression.   Education: Stress, Anxiety, and Depression - Group verbal and visual presentation to define topics covered.  Reviews how body is  impacted by stress, anxiety, and depression.  Also discusses healthy ways to reduce stress and to treat/manage anxiety and depression.  Written material given at graduation.   Education: Sleep Hygiene -Provides group verbal and written instruction about how sleep can affect your health.  Define sleep hygiene, discuss sleep cycles and impact of sleep habits. Review good sleep hygiene tips.    Initial Review & Psychosocial Screening:  Initial Psych Review & Screening - 03/04/22 1039       Initial Review   Current issues with Current Stress Concerns    Source of Stress Concerns Chronic Illness    Comments new diagnosis, new ICD/Pacer  still working on accepting the stress  keeping positive.      Family Dynamics   Good Support System? Yes   9 siblings, 3 children  and grandchildren.     Barriers   Psychosocial barriers to participate in program There are no identifiable barriers or psychosocial needs.      Screening Interventions   Interventions Encouraged to exercise;To provide support and resources with identified psychosocial needs;Provide feedback about the scores to participant    Expected Outcomes Short Term goal: Utilizing psychosocial counselor, staff and physician to assist with identification of specific Stressors or current issues interfering with healing process. Setting desired goal for each stressor or current issue identified.;Long Term Goal: Stressors or current issues are controlled or eliminated.;Short Term goal: Identification and review with participant of  any Quality of Life or Depression concerns found by scoring the questionnaire.;Long Term goal: The participant improves quality of Life and PHQ9 Scores as seen by post scores and/or verbalization of changes             Quality of Life Scores:   Quality of Life - 03/12/22 1545       Quality of Life   Select Quality of Life      Quality of Life Scores   Health/Function Pre 14.87 %    Socioeconomic Pre 18.5 %     Psych/Spiritual Pre 15.71 %    Family Pre 15.6 %    GLOBAL Pre 15.97 %            Scores of 19 and below usually indicate a poorer quality of life in these areas.  A difference of  2-3 points is a clinically meaningful difference.  A difference of 2-3 points in the total score of the Quality of Life Index has been associated with significant improvement in overall quality of life, self-image, physical symptoms, and general health in studies assessing change in quality of life.  PHQ-9: Review Flowsheet  More data exists      04/10/2022 03/27/2022 03/12/2022 09/04/2021 03/19/2021  Depression screen PHQ 2/9  Decreased Interest 0 0 2 0 3  Down, Depressed, Hopeless _0 0 2  PHQ - 2 Score _1 0 5  Altered sleeping 0 0 1 - -  Tired, decreased energy 0 1 1 - -  Change in appetite _2 - -  Feeling bad or failure about yourself  0 2 0 - -  Trouble concentrating 0 1 0 - -  Moving slowly or fidgety/restless 0 0 0 - -  Suicidal thoughts 0 0 0 - -  PHQ-9 Score _3 - -  Difficult doing work/chores Not difficult at all Not difficult at all Not difficult at all - -   Interpretation of Total Score  Total Score Depression Severity:  1-4 = Minimal depression, 5-9 = Mild depression, 10-14 = Moderate depression, 15-19 = Moderately severe depression, 20-27 = Severe depression   Psychosocial Evaluation and Intervention:  Psychosocial Evaluation - 03/04/22 1101       Psychosocial Evaluation & Interventions   Interventions Encouraged to exercise with the program and follow exercise prescription    Comments Shannon [name] has been dealing with multiple health issues and then new diagnosis of Heart Failure. She has had recent implantation of an ICD/Pacemaker. THis has all become overwhelming to her at times. She states she does her best to maintain a positive attitude and continue to manage her day to day life. She has a large support team with 9 siblings, 3 children and her grandchildren. SHe is aware  that if she starts feeling depression that she cannot clear, that she will reach out to her physician for meds if needed.  She stated that there are days just talking about all her health issues can bring tears to her eyes.  She is a cigarette smoker and has decreased her habit to a little under 1/2 pack a day. She has not set a quit date yet. She has reached out to the [facility] Quitline and received nicotene patches that she has used some. She has chronic pain . She has to manage her medications to not cause her to sleep all the time. She has had liver disease for over 20 years.    Expected Outcomes STG She is  able to attend all scheduled sessions, she will continue to work on smoking cessation, and she will reach out to her physician if her depression symptoms become daily.  LTG: She will continue to progress with her exercise and manage any depression symptoms that occur after discharge    Continue Psychosocial Services  Follow up required by staff             Psychosocial Re-Evaluation:  Psychosocial Re-Evaluation     [place] Name 03/27/22 1125 04/10/22 1106 04/24/22 1123         Psychosocial Re-Evaluation   Current issues with Current Stress Concerns -- Current Stress Concerns     Comments Reviewed patient health questionnaire (PHQ-9) with patient for follow up. Previously, patients score indicated signs/symptoms of depression.  Reviewed to see if patient is improving symptom wise while in program.  Score improved and patient states that it is because she has had a little more energy since she has started the program. Reviewed patient health questionnaire (PHQ-9) with patient for follow up. Previously, patients score indicated signs/symptoms of depression.  Reviewed to see if patient is improving symptom wise while in program.  Score improved and patient states that it is because she is feeling better overall.  She still has some down days but overall improving. Shannon [name] reports chest pain yesterday all  day and is stressed from that. She reports that she is not feeling it today aside from walking. Heaviness, tightness, and sharp pain - she did not tell her MD yet - highly encouraged her to speak with MD regarding this. Overall Shannon [name] reports feeling better mentally and physcially.     Expected Outcomes Short: Continue to attend [facility] regularly for regular exercise and social engagement. Long: Continue to improve symptoms and manage a positive mental state. Short: Continue to attend [facility] regularly for regular exercise and social engagement. Long: Continue to improve symptoms and manage a positive mental state. Short: Continue to attend [facility] regularly for regular exercise and social engagement. Long: Continue to improve symptoms and manage a positive mental state.     Interventions Encouraged to attend Cardiac Rehabilitation for the exercise Encouraged to attend Cardiac Rehabilitation for the exercise;Stress management education Encouraged to attend Cardiac Rehabilitation for the exercise;Stress management education     Continue Psychosocial Services  Follow up required by staff Follow up required by staff Follow up required by staff              Psychosocial Discharge (Final Psychosocial Re-Evaluation):  Psychosocial Re-Evaluation - 04/24/22 1123       Psychosocial Re-Evaluation   Current issues with Current Stress Concerns    Comments Shannon [name] reports chest pain yesterday all day and is stressed from that. She reports that she is not feeling it today aside from walking. Heaviness, tightness, and sharp pain - she did not tell her MD yet - highly encouraged her to speak with MD regarding this. Overall Shannon [name] reports feeling better mentally and physcially.    Expected Outcomes Short: Continue to attend [facility] regularly for regular exercise and social engagement. Long: Continue to improve symptoms and manage a positive mental state.    Interventions  Encouraged to attend Cardiac Rehabilitation for the exercise;Stress management education    Continue Psychosocial Services  Follow up required by staff             Vocational Rehabilitation: Provide vocational rehab assistance to qualifying candidates.   Vocational Rehab Evaluation & Intervention:  Vocational Rehab - 03/04/22 4801  Initial Vocational Rehab Evaluation & Intervention   Assessment shows need for Vocational Rehabilitation No      Vocational Rehab Re-Evaulation   Comments retired      Discharge Vocational Rehab   Discharge Vocational Rehabilitation retired             Education: Education Goals: Education classes will be provided on a variety of topics geared toward better understanding of heart health and risk factor modification. Participant will state understanding/return demonstration of topics presented as noted by education test scores.  Learning Barriers/Preferences:   General Cardiac Education Topics:  AED/CPR: - Group verbal and written instruction with the use of models to demonstrate the basic use of the AED with the basic ABC's of resuscitation.   Anatomy and Cardiac Procedures: - Group verbal and visual presentation and models provide information about basic cardiac anatomy and function. Reviews the testing methods done to diagnose heart disease and the outcomes of the test results. Describes the treatment choices: Medical Management, Angioplasty, or Coronary Bypass Surgery for treating various heart conditions including Myocardial Infarction, Angina, Valve Disease, and Cardiac Arrhythmias.  Written material given at graduation.   Medication Safety: - Group verbal and visual instruction to review commonly prescribed medications for heart and lung disease. Reviews the medication, class of the drug, and side effects. Includes the steps to properly store meds and maintain the prescription regimen.  Written material given at  graduation.   Intimacy: - Group verbal instruction through game format to discuss how heart and lung disease can affect sexual intimacy. Written material given at graduation.. Flowsheet Row Cardiac Rehab from 03/27/2022 in [facility] Cardiac and Pulmonary Rehab  Date 03/20/22  Educator [facility]  Instruction Review Code 1- Verbalizes Understanding       Know Your Numbers and Heart Failure: - Group verbal and visual instruction to discuss disease risk factors for cardiac and pulmonary disease and treatment options.  Reviews associated critical values for Overweight/Obesity, Hypertension, Cholesterol, and Diabetes.  Discusses basics of heart failure: signs/symptoms and treatments.  Introduces Heart Failure Zone chart for action plan for heart failure.  Written material given at graduation.   Infection Prevention: - Provides verbal and written material to individual with discussion of infection control including proper hand washing and proper equipment cleaning during exercise session. Flowsheet Row Cardiac Rehab from 03/27/2022 in [facility] Cardiac and Pulmonary Rehab  Date 03/12/22  Educator [facility]  Instruction Review Code 1- Verbalizes Understanding       Falls Prevention: - Provides verbal and written material to individual with discussion of falls prevention and safety. Flowsheet Row Cardiac Rehab from 03/27/2022 in [facility] Cardiac and Pulmonary Rehab  Date 03/12/22  Educator [facility]  Instruction Review Code 1- Verbalizes Understanding       Other: -Provides group and verbal instruction on various topics (see comments)   Knowledge Questionnaire Score:  Knowledge Questionnaire Score - 03/12/22 1549       Knowledge Questionnaire Score   Pre Score Patient didn't have time to fill out in orientaiton, knowledge quiz sent home for patient to complete and bring back.             Core Components/Risk Factors/Patient Goals at Admission:  Personal Goals and Risk Factors at Admission - 03/12/22 1547        Core Components/Risk Factors/Patient Goals on Admission    Weight Management Yes    Intervention Weight Management: Provide education and appropriate resources to help participant work on and attain dietary goals.;Weight Management: Develop a combined  nutrition and exercise program designed to reach desired caloric intake, while maintaining appropriate intake of nutrient and fiber, sodium and fats, and appropriate energy expenditure required for the weight goal.    Admit Weight 173 lb 4.8 oz (78.6 kg)    Goal Weight: Short Term 165 lb (74.8 kg)    Goal Weight: Long Term 155 lb (70.3 kg)    Expected Outcomes Short Term: Continue to assess and modify interventions until short term weight is achieved;Long Term: Adherence to nutrition and physical activity/exercise program aimed toward attainment of established weight goal;Weight Loss: Understanding of general recommendations for a balanced deficit meal plan, which promotes 1-2 lb weight loss per week and includes a negative energy balance of [phone] kcal/d    Tobacco Cessation Yes    Number of packs per day 1/2 pack  a day   Lether is a current tobacco user. Intervention for tobacco cessation was provided at the initial medical review. She was asked about readiness to quit and reported that she has not set a quit date. She has weaned down to under a pack a day and is working towards a quit.   . Patient was advised and educated about tobacco cessation using combination therapy, tobacco cessation classes, quit line, and quit smoking apps. Patient demonstrated understanding of this material. Staff will continue to provide encouragement and follow up with the patient throughout the program.  She has already reached out to the [facility] Quitline and received patches. She has used some patches.    Intervention Assist the participant in steps to quit. Provide individualized education and counseling about committing to Tobacco Cessation, relapse prevention, and  pharmacological support that can be provided by physician.;Advice worker, assist with locating and accessing local/national Quit Smoking programs, and support quit date choice.    Expected Outcomes Short Term: Will demonstrate readiness to quit, by selecting a quit date.;Short Term: Will quit all tobacco product use, adhering to prevention of relapse plan.;Long Term: Complete abstinence from all tobacco products for at least 12 months from quit date.    Diabetes Yes    Intervention Provide education about signs/symptoms and action to take for hypo/hyperglycemia.;Provide education about proper nutrition, including hydration, and aerobic/resistive exercise prescription along with prescribed medications to achieve blood glucose in normal ranges: Fasting glucose 65-99 mg/dL    Expected Outcomes Short Term: Participant verbalizes understanding of the signs/symptoms and immediate care of hyper/hypoglycemia, proper foot care and importance of medication, aerobic/resistive exercise and nutrition plan for blood glucose control.;Long Term: Attainment of HbA1C < 7%.    Heart Failure Yes    Intervention Provide a combined exercise and nutrition program that is supplemented with education, support and counseling about heart failure. Directed toward relieving symptoms such as shortness of breath, decreased exercise tolerance, and extremity edema.    Expected Outcomes Improve functional capacity of life;Short term: Attendance in program 2-3 days a week with increased exercise capacity. Reported lower sodium intake. Reported increased fruit and vegetable intake. Reports medication compliance.;Short term: Daily weights obtained and reported for increase. Utilizing diuretic protocols set by physician.;Long term: Adoption of self-care skills and reduction of barriers for early signs and symptoms recognition and intervention leading to self-care maintenance.             Education:Diabetes - Individual  verbal and written instruction to review signs/symptoms of diabetes, desired ranges of glucose level fasting, after meals and with exercise. Acknowledge that pre and post exercise glucose checks will be done for 3 sessions at  entry of program. Flowsheet Row Cardiac Rehab from 03/27/2022 in [facility] Cardiac and Pulmonary Rehab  Date 03/12/22  Educator [facility]  Instruction Review Code 1- Verbalizes Understanding       Core Components/Risk Factors/Patient Goals Review:   Goals and Risk Factor Review     Row Name 03/27/22 1124 04/24/22 1132           Core Components/Risk Factors/Patient Goals Review   Personal Goals Review Weight Management/Obesity;Diabetes Weight Management/Obesity;Diabetes      Review Shannon [name] has been doing well in class and states that she would like to lose a little weight. Her blood sugars have been in check and she has been checking them at home. She is off insulin since she is on Jardiance now. Shannon [name] has reported doing well in rehab and feels better overall physically as well as mentally. She reports her BG has been running in the 80s - reviewed on what to do in case of low BG. Weight has been stable since starting rehab. She is taking all of her medications as directed, but feels she is having side effects with Jardiance and is going to tell her MD that she would like to switch back to insulin. Shannon [name] reports chest pain yesterday all day and is stressed from that. She reports that she is not feeling it today aside from walking. Heaviness, tightness, and sharp pain - she did not tell her MD yet - highly encouraged her to speak with MD regarding this.      Expected Outcomes Short: lose some weight. Long: maintain weight loss independently. ST: speak with MD regarding recent chest pain and medication swap from [place] LT: monitor lifestyle               Core Components/Risk Factors/Patient Goals at Discharge (Final Review):   Goals and Risk Factor Review - 04/24/22 1132        Core Components/Risk Factors/Patient Goals Review   Personal Goals Review Weight Management/Obesity;Diabetes    Review Shannon [name] has reported doing well in rehab and feels better overall physically as well as mentally. She reports her BG has been running in the 80s - reviewed on what to do in case of low BG. Weight has been stable since starting rehab. She is taking all of her medications as directed, but feels she is having side effects with Jardiance and is going to tell her MD that she would like to switch back to insulin. Shannon [name] reports chest pain yesterday all day and is stressed from that. She reports that she is not feeling it today aside from walking. Heaviness, tightness, and sharp pain - she did not tell her MD yet - highly encouraged her to speak with MD regarding this.    Expected Outcomes ST: speak with MD regarding recent chest pain and medication swap from Shannon [name] LT: monitor lifestyle             ITP Comments:  ITP Comments     Row Name 03/04/22 1100 03/12/22 1529 03/20/22 1320 04/09/22 0827 04/17/22 1350   ITP Comments Virtual orientation call completed today. shehas an appointment on Date: 03/12/2022  for EP eval and gym Orientation.  Documentation of diagnosis can be found in [facility] Date: 11/05/2021 .     Shannon [name] is a current tobacco user. Intervention for tobacco cessation was provided at the initial medical review. She was asked about readiness to quit and reported that she has not set a quit date. She has weaned down to under a pack  a day and is working towards a quit.   . Patient was advised and educated about tobacco cessation using combination therapy, tobacco cessation classes, quit line, and quit smoking apps. Patient demonstrated understanding of this material. Staff will continue to provide encouragement and follow up with the patient throughout the program.  She has already reached out to the [facility] Quitline and received patches. She has used some patches. Completed 6MWT  and gym orientation. Initial ITP created and sent for review to Dr. Emily Filbert, Medical Director.Shannon [name]  is a current tobacco user. Intervention for tobacco cessation was provided at the initial medical review. She was asked about readiness to quit and reported she has called the quit line and is working with the resources they have given her but does not currently have a quit date.  . Patient was advised and educated about tobacco cessation using combination therapy, tobacco cessation classes, quit line, and quit smoking apps. Patient demonstrated understanding of this material. Staff will continue to provide encouragement and follow up with the patient throughout the program. First full day of exercise!  Patient was oriented to gym and equipment including functions, settings, policies, and procedures.  Patient's individual exercise prescription and treatment plan were reviewed.  All starting workloads were established based on the results of the 6 minute walk test done at initial orientation visit.  The plan for exercise progression was also introduced and progression will be customized based on patient's performance and goals. 30 Day review completed. Medical Director ITP review done, changes made as directed, and signed approval by Medical Director.   New to program Completed initial RD consultation    Harris Name 05/07/22 1057           ITP Comments 30 Day review completed. Medical Director ITP review done, changes made as directed, and signed approval by Medical Director.                Comments:

## 2022-05-08 ENCOUNTER — Ambulatory Visit
Admission: EM | Admit: 2022-05-08 | Discharge: 2022-05-08 | Disposition: A | Discharge status: Home / Self Care | Payer: Medicare Other | Attending: Physician Assistant | Admitting: Physician Assistant

## 2022-05-08 DIAGNOSIS — R519 Headache, unspecified: Secondary | ICD-10-CM

## 2022-05-08 DIAGNOSIS — R0981 Nasal congestion: Secondary | ICD-10-CM

## 2022-05-08 DIAGNOSIS — J019 Acute sinusitis, unspecified: Secondary | ICD-10-CM

## 2022-05-08 DIAGNOSIS — L989 Disorder of the skin and subcutaneous tissue, unspecified: Secondary | ICD-10-CM | POA: Diagnosis not present

## 2022-05-08 DIAGNOSIS — B370 Candidal stomatitis: Secondary | ICD-10-CM

## 2022-05-08 MED ORDER — NYSTATIN 100000 UNIT/ML MT SUSP: 5.0000 mL | Freq: Four times a day (QID) | OROMUCOSAL | 0 refills | Status: AC

## 2022-05-08 MED ORDER — AMOXICILLIN-POT CLAVULANATE 875-125 MG PO TABS: 1.0000 | ORAL_TABLET | Freq: Two times a day (BID) | ORAL | 0 refills | Status: AC

## 2022-05-08 NOTE — ED Triage Notes (Signed)
Chief Complaint: negative home COVID test. Congestion and sinus pressure. Post nasal drip   Onset: 5 days   Prescriptions or OTC medications tried: Yes- sinus rinse, robitussin, tylenol     with mild relief  Sick exposure: No  New foods, medications, or products: No  Recent Travel: No

## 2022-05-08 NOTE — ED Provider Notes (Signed)
[facility]    CSN: [id number] Arrival date & time: 05/08/22  0957      History   Chief Complaint Chief Complaint  Patient presents with   Sinus Problem    HPI Shannon [name] is a 66 y.o. female presenting for 1 week history of consistently worsening sinus pain and pressure, discolored nasal drainage, fatigue, headaches, postnasal drainage.  Patient reports she think she has a sinus infection.  Reports history of sinus infections.  Performed a COVID test at home and it was negative.  She says she has had vaccines for COVID, flu and RSV.  She says she has tried multiple over-the-counter medications, nasal sprays and sinus rinses without relief.  She reports that she has a history of lupus, asthma, A-fib, diabetes, cardiomyopathy, pacemaker.  HPI  Past Medical History:  Diagnosis Date   Abdominal bloating 08/16/2013   Adjustment disorder 11/04/2010   Asthma    Atrial fibrillation with RVR ([place])    as of 11/2021   Autoimmune hepatitis ([place])    Bradycardia    Depression    Diabetes mellitus without complication ([place])    Dilated cardiomyopathy ([place])    DUB (dysfunctional uterine bleeding)    2008 s/p hysterectomy   Elevated liver enzymes 04/11/2020   Exposure to TB 2005   tx'ed x 6 months   Fatigue 01/23/2015   Gallstones    Gastric ulcer    GERD (gastroesophageal reflux disease)    HFrEF (heart failure with reduced ejection fraction) ([place])    unc cards as of 11/2021   History of asthma 02/26/2018   History of chicken pox    Hx of migraines    Leukopenia 04/11/2020   Liver disease    very low liver function, auto-immune hepatitis   Motion sickness    Osteoarthritis    Systemic lupus erythematosus ([place]) 05/31/2010   Qualifier: Diagnosis of  By: Deborra Medina MD, Talia     Vitamin D deficiency     Patient Active Problem List   Diagnosis Date Noted   Atrial fibrillation with RVR ([place]) 12/30/2021   Dilated cardiomyopathy ([place]) 12/30/2021   Bradycardia  12/30/2021   HFrEF (heart failure with reduced ejection fraction) ([place]) 12/27/2021   Myalgia due to statin 12/27/2021   Low serum vitamin B12 12/27/2021   Anxiety 03/27/2021   Adrenal insufficiency due to corticosteroid withdrawal ([place]) 01/02/2020   Hirsutism 09/27/2019   Osteoarthritis of both shoulders 09/27/2019   Type 2 diabetes mellitus without complication, without long-term current use of insulin ([place]) 03/25/2019   Gallstones 10/05/2018   Mild intermittent asthma with exacerbation 04/07/2018   History of colon polyps 03/03/2018   Cirrhosis of liver ([place]) 02/26/2018   Sacroiliac joint pain 02/26/2018   Arthritis 02/26/2018   Chronic pain 02/26/2018   Bronchitis 08/23/2017   Portal hypertensive gastropathy ([place]) 01/23/2015   HLD (hyperlipidemia) 11/23/2013   Tobacco abuse 11/23/2013   Fibromyalgia 08/16/2013   Lumbosacral radiculopathy at L5 03/26/2012   OA (osteoarthritis) of knee 03/26/2012   Candidiasis 10/08/2011   Hyperpigmentation 11/04/2010   Steroid-induced diabetes ([place]) 05/31/2010   Vitamin D deficiency 05/31/2010   Autoimmune hepatitis ([place]) 05/31/2010   Systemic lupus erythematosus ([place]) 05/31/2010    Past Surgical History:  Procedure Laterality Date   ABDOMINAL HYSTERECTOMY     BACK SURGERY     L4/5 herniated disc repair 1992    HALLUX VALGUS AUSTIN Left 02/09/2019   Procedure: HALLUX VALGUS AUSTIN ([place]);  Surgeon: [name], DPM;  [facility];  Service: Podiatry;  Laterality: Left;  General with local   HAMMER TOE SURGERY Left 02/09/2019   Procedure: HAMMER TOE CORRECTION;  Surgeon: [name], DPM;  Location: [facility];  Service: Podiatry;  Laterality: Left;  Diabetic - oral meds   LAPAROSCOPIC HYSTERECTOMY     TUBAL LIGATION      OB History   No obstetric history on file.      Home Medications    Prior to Admission medications   Medication Sig Start Date End Date Taking? Authorizing Provider   acetaminophen (TYLENOL) 650 MG CR tablet Take by mouth.   Yes [provider]  albuterol (VENTOLIN HFA) 108 (90 Base) MCG/ACT inhaler USE 1 TO 2 INHALATIONS BY  MOUTH INTO THE LUNGS EVERY  6 HOURS AS NEEDED FOR  WHEEZING OR SHORTNESS OF  BREATH 10/02/21  Yes McLean-Scocuzza, Nino Glow, MD  amoxicillin-clavulanate (AUGMENTIN) 875-125 MG tablet Take 1 tablet by mouth every 12 (twelve) hours for 7 days. 05/08/22 05/15/22 Yes Danton Clap, PA-C  apixaban (ELIQUIS) 5 MG TABS tablet Take 1 tablet (5 mg total) by mouth 2 (two) times daily. 12/18/21  Yes Carollee Leitz, MD  azaTHIOprine (IMURAN) 50 MG tablet Take 4 tablets (200 mg total) by mouth daily. 12/18/21  Yes Carollee Leitz, MD  blood glucose meter kit and supplies KIT Dispense based on patient and insurance preference. Use up to four times daily as directed. (FOR ICD-9 250.00, 250.01). 01/02/20  Yes Marval Regal, NP  blood glucose meter kit and supplies Dispense based on patient and insurance preference. Use up to four times daily as directed. (FOR ICD-10 E10.9, E11.9). 01/02/20  Yes Marval Regal, NP  Blood Glucose Monitoring Suppl (ONE TOUCH ULTRA SYSTEM KIT) W/DEVICE KIT Test blood sugar as directed Dx E09.9 04/28/14  Yes Lucille Passy, MD  celecoxib (CELEBREX) 100 MG capsule Take 100 mg by mouth daily. 01/03/22  Yes [provider]  Cholecalciferol (VITAMIN D3) 5000 units TABS Take by mouth.    Yes [provider]  empagliflozin (JARDIANCE) 25 MG TABS tablet Take 1 tablet (25 mg total) by mouth daily before breakfast. 12/18/21  Yes Carollee Leitz, MD  furosemide (LASIX) 40 MG tablet Take 1 tablet (40 mg total) by mouth daily. 12/18/21  Yes Carollee Leitz, MD  glucose blood (ONETOUCH VERIO) test strip 1 each by Other route 4 (four) times daily. Use as instructed 05/15/20  Yes McLean-Scocuzza, Nino Glow, MD  lactulose (CHRONULAC) 10 GM/15ML solution TAKE 30 ML BY MOUTH 4 TIMES DAILY AS NEEDED Patient taking differently: Take 30 g by  mouth. 50-60 g 1x per day not 30 g qid 09/26/14  Yes Lucille Passy, MD  lidocaine (LIDODERM) 5 % Place 1 patch onto the skin daily. Remove & Discard patch within 12 hours or as directed by MD 09/22/12  Yes Copland, Frederico Hamman, MD  loratadine (CLARITIN) 10 MG tablet Take 10 mg by mouth every other day as needed for allergies.   Yes [provider]  losartan (COZAAR) 25 MG tablet Take 1 tablet by mouth daily. 02/17/22  Yes [provider]  Magnesium Oxide 500 MG TABS Take by mouth.   Yes [provider]  metFORMIN (GLUCOPHAGE-XR) 500 MG 24 hr tablet TAKE 2 TABLETS BY MOUTH TWICE  DAILY WITH A MEAL 01/07/22  Yes Dutch Quint B, FNP  metoprolol tartrate (LOPRESSOR) 50 MG tablet Take 1 tablet (50 mg total) by mouth 2 (two) times daily. Patient taking differently: Take  25 mg by mouth 2 (two) times daily. 12/18/21  Yes Carollee Leitz, MD  miconazole (MICONAZOLE 3) 200 MG vaginal suppository Place 1 suppository (200 mg total) vaginally at bedtime. 12/18/21  Yes Carollee Leitz, MD  omeprazole (PRILOSEC) 40 MG capsule TAKE 1 CAPSULE BY MOUTH  DAILY 1/2 HOUR BEFORE A  MEAL 09/27/21  Yes McLean-Scocuzza, Nino Glow, MD  predniSONE (DELTASONE) 1 MG tablet Take 3 mg by mouth daily. 10/25/20  Yes [provider]  Simethicone 180 MG CAPS Take by mouth.   Yes [provider]  spironolactone (ALDACTONE) 25 MG tablet Take 1 tablet (25 mg total) by mouth daily. 12/18/21  Yes Carollee Leitz, MD  traMADol (ULTRAM) 50 MG tablet Take by mouth. 01/22/22  Yes [provider]  traZODone (DESYREL) 50 MG tablet Take 25 mg by mouth at bedtime as needed. 11/13/21  Yes [provider]  vitamin B-12 (CYANOCOBALAMIN) 100 MCG tablet Take 100 mcg by mouth daily.   Yes [provider]  insulin degludec (TRESIBA FLEXTOUCH) 100 UNIT/ML FlexTouch Pen Inject 10 Units into the skin daily. 04/24/21   McLean-Scocuzza, Nino Glow, MD  Insulin Pen Needle (PEN NEEDLES) 31G X 6 MM MISC Use with Tyler Aas  as instructed daily 09/13/20   McLean-Scocuzza, Nino Glow, MD  nystatin (MYCOSTATIN) 100000 UNIT/ML suspension Take 5 mLs (500,000 Units total) by mouth 4 (four) times daily. Hold and spit 05/08/22   Danton Clap, PA-C    Family History Family History  Problem Relation Age of Onset   Cancer Mother        lung cancer smoker   Heart disease Father    Hypertension Father    Hypertension Sister    Sarcoidosis Sister        lung transplant failure   Heart disease Brother    Alcohol abuse Brother    Diabetes Sister    Diabetes Sister    Kidney disease Sister    Hypertension Sister    Rheum arthritis Sister    Diabetes Son    Hypertension Son     Social History Social History   Tobacco Use   Smoking status: Every Day    Packs/day: 0.50    Years: 30.00    Total pack years: 15.00    Types: Cigarettes   Smokeless tobacco: Never   Tobacco comments:    Aware of need to quit  has decreased number of cigarettes per day. Has reached out to [facility] Quitline and has patches. No quit date yet.   Vaping Use   Vaping Use: Never used  Substance Use Topics   Alcohol use: No   Drug use: No     Allergies   Hydrocodone, Lipitor [atorvastatin calcium], Milk-related compounds, Oxycodone, Wellbutrin [bupropion], and Adhesive [tape]   Review of Systems Review of Systems  Constitutional:  Positive for fatigue. Negative for chills, diaphoresis and fever.  HENT:  Positive for congestion, rhinorrhea, sinus pressure and sinus pain. Negative for ear pain and sore throat.   Respiratory:  Positive for cough. Negative for shortness of breath.   Gastrointestinal:  Negative for abdominal pain, nausea and vomiting.  Musculoskeletal:  Negative for arthralgias and myalgias.  Skin:  Negative for rash.  Neurological:  Positive for headaches. Negative for weakness.  Hematological:  Negative for adenopathy.     Physical Exam Triage Vital Signs ED Triage Vitals  Enc Vitals Group     BP 05/08/22 1130 (!)  114/52     Pulse Rate 05/08/22 1130 72  Resp 05/08/22 1130 16     Temp 05/08/22 1130 98.2 F (36.8 C)     Temp Source 05/08/22 1130 Oral     SpO2 05/08/22 1130 97 %     Weight 05/08/22 1132 173 lb 4.5 oz (78.6 kg)     Height 05/08/22 1132 _0  (1.676 m)     Head Circumference --      Peak Flow --      Pain Score 05/08/22 1128 8     Pain Loc --      Pain Edu? --      Excl. in [place]? --    No data found.  Updated Vital Signs BP (!) 114/52 (BP Location: Left Arm)   Pulse 72   Temp 98.2 F (36.8 C) (Oral)   Resp 16   Ht _1  (1.676 m)   Wt 173 lb 4.5 oz (78.6 kg)   SpO2 97%   BMI 27.97 kg/m      Physical Exam Vitals and nursing note reviewed.  Constitutional:      General: She is not in acute distress.    Appearance: Normal appearance. She is ill-appearing. She is not toxic-appearing.  HENT:     Head: Normocephalic and atraumatic.     Nose: Congestion present.     Mouth/Throat:     Mouth: Mucous membranes are moist.     Pharynx: Oropharynx is clear.  Eyes:     General: No scleral icterus.       Right eye: No discharge.        Left eye: No discharge.     Conjunctiva/sclera: Conjunctivae normal.  Cardiovascular:     Rate and Rhythm: Normal rate and regular rhythm.     Heart sounds: Normal heart sounds.  Pulmonary:     Effort: Pulmonary effort is normal. No respiratory distress.     Breath sounds: Normal breath sounds.  Musculoskeletal:     Cervical back: Neck supple.  Skin:    General: Skin is dry.  Neurological:     General: No focal deficit present.     Mental Status: She is alert. Mental status is at baseline.     Motor: No weakness.     Gait: Gait normal.  Psychiatric:        Mood and Affect: Mood normal.        Behavior: Behavior normal.        Thought Content: Thought content normal.      UC Treatments / Results  Labs (all labs ordered are listed, but only abnormal results are displayed) Labs Reviewed - No data to  display  EKG   Radiology No results found.  Procedures Procedures (including critical care time)  Medications Ordered in UC Medications - No data to display  Initial Impression / Assessment and Plan / UC Course  I have reviewed the triage vital signs and the nursing notes.  Pertinent labs & imaging results that were available during my care of the patient were reviewed by me and considered in my medical decision making (see chart for details).   66 year old female with history of lupus, diabetes, cardiomyopathy and pacemaker presents for 1 week history of worsening sinus pain, discolored nasal drainage when she says is yellowish-green and sometimes brown, cough and headaches.  History of sinus infections.  Negative at home COVID test.  Vitals are stable.  She is mildly ill-appearing but nontoxic.  Nasal congestion on exam.  Chest clear auscultation heart regular rate rhythm.  Suspect  secondary bacterial sinusitis.  Will treat this time with Augmentin.  Patient reports that she gets thrush when she takes any antibiotics and always has to have nystatin.  I prescribed this for her.  Advised to continue decongestants and nasal sprays, rest and fluids.  Reviewed return and ER precautions.   Final Clinical Impressions(s) / UC Diagnoses   Final diagnoses:  Acute sinusitis, recurrence not specified, unspecified location  Facial pain  Nasal congestion  Disorder of skin and mucous membrane of mouth, lips, and tongue   Discharge Instructions   None    ED Prescriptions     Medication Sig Dispense Auth. Provider   amoxicillin-clavulanate (AUGMENTIN) 875-125 MG tablet Take 1 tablet by mouth every 12 (twelve) hours for 7 days. 14 tablet Laurene Footman B, PA-C   nystatin (MYCOSTATIN) 100000 UNIT/ML suspension Take 5 mLs (500,000 Units total) by mouth 4 (four) times daily. Hold and spit 473 mL Danton Clap, PA-C      PDMP not reviewed this encounter.   Danton Clap, PA-C 05/08/22  1217

## 2022-05-13 ENCOUNTER — Encounter: Payer: Medicare Other | Attending: Family Medicine

## 2022-05-13 DIAGNOSIS — I5022 Chronic systolic (congestive) heart failure: Secondary | ICD-10-CM | POA: Insufficient documentation

## 2022-05-15 ENCOUNTER — Encounter: Payer: Medicare Other | Admitting: *Deleted

## 2022-05-20 ENCOUNTER — Telehealth: Payer: Self-pay

## 2022-05-20 NOTE — Telephone Encounter (Signed)
Called patient to follow up on cardiac rehab, she is still recovery from being sick and still not feeling the best. She was diagnosed with a respiratory virus and sinusitis with antibiotics. She is also in the process of dealing with medication changes and has not heard back from her doctors after sending a message. I encouraged her to call today to follow up. Canceled rehab appointments until next week and told her to call us on updates and how she is feeling.

## 2022-05-27 DIAGNOSIS — I5022 Chronic systolic (congestive) heart failure: Secondary | ICD-10-CM

## 2022-05-29 ENCOUNTER — Encounter: Payer: Medicare Other | Admitting: *Deleted

## 2022-06-03 ENCOUNTER — Encounter: Payer: Self-pay | Admitting: *Deleted

## 2022-06-03 DIAGNOSIS — I5022 Chronic systolic (congestive) heart failure: Secondary | ICD-10-CM

## 2022-06-03 NOTE — Progress Notes (Signed)
Discharge Note  Adisson Deak  April 02, 1956  Discharged today. Taleigh called, she cannot afford her insurance co pay and pay for her meds.   She completed 13 sessions   6 Minute Walk     Row Name 03/12/22 1530         6 Minute Walk   Phase Initial     Distance 1265 feet     Walk Time 6 minutes     # of Rest Breaks 0     MPH 2.4     METS 3.03     RPE 11     Perceived Dyspnea  1     VO2 Peak 10.61     Symptoms Yes (comment)     Comments left leg joint pain during test 5/10 pain     Resting HR 71 bpm     Resting BP 110/64     Resting Oxygen Saturation  98 %     Exercise Oxygen Saturation  during 6 min walk 98 %     Max Ex. HR 90 bpm     Max Ex. BP 124/72     2 Minute Post BP 114/70

## 2022-06-03 NOTE — Progress Notes (Signed)
Cardiac Individual Treatment Plan  Patient Details  Name: Shannon [name] MRN: [id number] Date of Birth: April 02, 1956 Referring Provider:   Flowsheet Row Cardiac Rehab from 03/12/2022 in [facility] Cardiac and Pulmonary Rehab  Referring Provider C. Deen       Initial Encounter Date:  Flowsheet Row Cardiac Rehab from 03/12/2022 in [facility] Cardiac and Pulmonary Rehab  Date 03/12/22       Visit Diagnosis: Heart failure, chronic systolic (HCC)  Patient's Home Medications on Admission:  Current Outpatient Medications:    acetaminophen (TYLENOL) 650 MG CR tablet, Take by mouth., Disp: , Rfl:    albuterol (VENTOLIN HFA) 108 (90 Base) MCG/ACT inhaler, USE 1 TO 2 INHALATIONS BY  MOUTH INTO THE LUNGS EVERY  6 HOURS AS NEEDED FOR  WHEEZING OR SHORTNESS OF  BREATH, Disp: 34 g, Rfl: 2   apixaban (ELIQUIS) 5 MG TABS tablet, Take 1 tablet (5 mg total) by mouth 2 (two) times daily., Disp: , Rfl: 0   azaTHIOprine (IMURAN) 50 MG tablet, Take 4 tablets (200 mg total) by mouth daily., Disp: , Rfl:    blood glucose meter kit and supplies KIT, Dispense based on patient and insurance preference. Use up to four times daily as directed. (FOR ICD-9 250.00, 250.01)., Disp: 1 each, Rfl: 0   blood glucose meter kit and supplies, Dispense based on patient and insurance preference. Use up to four times daily as directed. (FOR ICD-10 E10.9, E11.9)., Disp: 1 each, Rfl: 0   Blood Glucose Monitoring Suppl (ONE TOUCH ULTRA SYSTEM KIT) W/DEVICE KIT, Test blood sugar as directed Dx E09.9, Disp: 1 each, Rfl: 0   celecoxib (CELEBREX) 100 MG capsule, Take 100 mg by mouth daily., Disp: , Rfl:    Cholecalciferol (VITAMIN D3) 5000 units TABS, Take by mouth. , Disp: , Rfl:    empagliflozin (JARDIANCE) 25 MG TABS tablet, Take 1 tablet (25 mg total) by mouth daily before breakfast., Disp: , Rfl:    furosemide (LASIX) 40 MG tablet, Take 1 tablet (40 mg total) by mouth daily., Disp: , Rfl: 0   glucose blood (ONETOUCH VERIO) test strip, 1  each by Other route 4 (four) times daily. Use as instructed, Disp: 400 strip, Rfl: 3   insulin degludec (TRESIBA FLEXTOUCH) 100 UNIT/ML FlexTouch Pen, Inject 10 Units into the skin daily., Disp: 15 mL, Rfl: 11   Insulin Pen Needle (PEN NEEDLES) 31G X 6 MM MISC, Use with [place] as instructed daily, Disp: 100 each, Rfl: 11   lactulose (CHRONULAC) 10 GM/15ML solution, TAKE 30 ML BY MOUTH 4 TIMES DAILY AS NEEDED (Patient taking differently: Take 30 g by mouth. 50-60 g 1x per day not 30 g qid), Disp: 10800 mL, Rfl: 3   lidocaine (LIDODERM) 5 %, Place 1 patch onto the skin daily. Remove & Discard patch within 12 hours or as directed by MD, Disp: 30 patch, Rfl: 5   loratadine (CLARITIN) 10 MG tablet, Take 10 mg by mouth every other day as needed for allergies., Disp: , Rfl:    losartan (COZAAR) 25 MG tablet, Take 1 tablet by mouth daily., Disp: , Rfl:    Magnesium Oxide 500 MG TABS, Take by mouth., Disp: , Rfl:    metFORMIN (GLUCOPHAGE-XR) 500 MG 24 hr tablet, TAKE 2 TABLETS BY MOUTH TWICE  DAILY WITH A MEAL, Disp: 400 tablet, Rfl: 2   metoprolol tartrate (LOPRESSOR) 50 MG tablet, Take 1 tablet (50 mg total) by mouth 2 (two) times daily. (Patient taking differently: Take 25 mg by mouth  2 (two) times daily.), Disp: , Rfl: 0   miconazole (MICONAZOLE 3) 200 MG vaginal suppository, Place 1 suppository (200 mg total) vaginally at bedtime., Disp: 3 suppository, Rfl: 0   nystatin (MYCOSTATIN) 100000 UNIT/ML suspension, Take 5 mLs (500,000 Units total) by mouth 4 (four) times daily. Hold and spit, Disp: 473 mL, Rfl: 0   omeprazole (PRILOSEC) 40 MG capsule, TAKE 1 CAPSULE BY MOUTH  DAILY 1/2 HOUR BEFORE A  MEAL, Disp: 90 capsule, Rfl: 3   predniSONE (DELTASONE) 1 MG tablet, Take 3 mg by mouth daily., Disp: , Rfl:    Simethicone 180 MG CAPS, Take by mouth., Disp: , Rfl:    spironolactone (ALDACTONE) 25 MG tablet, Take 1 tablet (25 mg total) by mouth daily., Disp: , Rfl: 0   traMADol (ULTRAM) 50 MG tablet, Take by  mouth., Disp: , Rfl:    traZODone (DESYREL) 50 MG tablet, Take 25 mg by mouth at bedtime as needed., Disp: , Rfl:    vitamin B-12 (CYANOCOBALAMIN) 100 MCG tablet, Take 100 mcg by mouth daily., Disp: , Rfl:   Past Medical History: Past Medical History:  Diagnosis Date   Abdominal bloating 08/16/2013   Adjustment disorder 11/04/2010   Asthma    Atrial fibrillation with RVR ([place])    as of 11/2021   Autoimmune hepatitis ([place])    Bradycardia    Depression    Diabetes mellitus without complication ([place])    Dilated cardiomyopathy ([place])    DUB (dysfunctional uterine bleeding)    2008 s/p hysterectomy   Elevated liver enzymes 04/11/2020   Exposure to TB 2005   tx'ed x 6 months   Fatigue 01/23/2015   Gallstones    Gastric ulcer    GERD (gastroesophageal reflux disease)    HFrEF (heart failure with reduced ejection fraction) ([place])    unc cards as of 11/2021   History of asthma 02/26/2018   History of chicken pox    Hx of migraines    Leukopenia 04/11/2020   Liver disease    very low liver function, auto-immune hepatitis   Motion sickness    Osteoarthritis    Systemic lupus erythematosus ([place]) 05/31/2010   Qualifier: Diagnosis of  By: Deborra Medina MD, Talia     Vitamin D deficiency     Tobacco Use: Social History   Tobacco Use  Smoking Status Every Day   Packs/day: 0.50   Years: 30.00   Total pack years: 15.00   Types: Cigarettes  Smokeless Tobacco Never  Tobacco Comments   Aware of need to quit  has decreased number of cigarettes per day. Has reached out to [facility] Quitline and has patches. No quit date yet.     Labs: Review Flowsheet  More data exists      Latest Ref Rng & Units 04/28/2019 07/25/2020 11/27/2020 03/20/2021 12/18/2021  Labs for ITP Cardiac and Pulmonary Rehab  Cholestrol 100 - 199 mg/dL 193  131  123  159  -  LDL (calc) 0 - 99 mg/dL 114  65  60  78  -  Direct LDL mg/dL - - - - 60.0   HDL-C >39 mg/dL 67  56  48.30  69  -  Trlycerides 0 - 149 mg/dL 66  39  73.0  58  -   Hemoglobin A1c 4.6 - 6.5 % 6.5  6.1  - 6.2  6.8      Exercise Target Goals: Exercise Program Goal: Individual exercise prescription set using results from initial 6 min walk test  and THRR while considering  patient's activity barriers and safety.   Exercise Prescription Goal: Initial exercise prescription builds to 30-45 minutes a day of aerobic activity, 2-3 days per week.  Home exercise guidelines will be given to patient during program as part of exercise prescription that the participant will acknowledge.   Education: Aerobic Exercise: - Group verbal and visual presentation on the components of exercise prescription. Introduces F.I.T.T principle from ACSM for exercise prescriptions.  Reviews F.I.T.T. principles of aerobic exercise including progression. Written material given at graduation. Flowsheet Row Cardiac Rehab from 03/27/2022 in [facility] Cardiac and Pulmonary Rehab  Date 03/20/22  Educator [facility]  Instruction Review Code 1- Verbalizes Understanding       Education: Resistance Exercise: - Group verbal and visual presentation on the components of exercise prescription. Introduces F.I.T.T principle from ACSM for exercise prescriptions  Reviews F.I.T.T. principles of resistance exercise including progression. Written material given at graduation.    Education: Exercise & Equipment Safety: - Individual verbal instruction and demonstration of equipment use and safety with use of the equipment. Flowsheet Row Cardiac Rehab from 03/27/2022 in [facility] Cardiac and Pulmonary Rehab  Date 03/12/22  Educator [facility]  Instruction Review Code 1- Verbalizes Understanding       Education: Exercise Physiology & General Exercise Guidelines: - Group verbal and written instruction with models to review the exercise physiology of the cardiovascular system and associated critical values. Provides general exercise guidelines with specific guidelines to those with heart or lung disease.    Education:  Flexibility, Balance, Mind/Body Relaxation: - Group verbal and visual presentation with interactive activity on the components of exercise prescription. Introduces F.I.T.T principle from ACSM for exercise prescriptions. Reviews F.I.T.T. principles of flexibility and balance exercise training including progression. Also discusses the mind body connection.  Reviews various relaxation techniques to help reduce and manage stress (i.e. Deep breathing, progressive muscle relaxation, and visualization). Balance handout provided to take home. Written material given at graduation.   Activity Barriers & Risk Stratification:  Activity Barriers & Cardiac Risk Stratification - 03/04/22 1036       Activity Barriers & Cardiac Risk Stratification   Activity Barriers Back Problems   chronic pain, knows her limits   Cardiac Risk Stratification High             6 Minute Walk:  6 Minute Walk     Row Name 03/12/22 1530         6 Minute Walk   Phase Initial     Distance 1265 feet     Walk Time 6 minutes     # of Rest Breaks 0     MPH 2.4     METS 3.03     RPE 11     Perceived Dyspnea  1     VO2 Peak 10.61     Symptoms Yes (comment)     Comments left leg joint pain during test 5/10 pain     Resting HR 71 bpm     Resting BP 110/64     Resting Oxygen Saturation  98 %     Exercise Oxygen Saturation  during 6 min walk 98 %     Max Ex. HR 90 bpm     Max Ex. BP 124/72     2 Minute Post BP 114/70              Oxygen Initial Assessment:   Oxygen Re-Evaluation:   Oxygen Discharge (Final Oxygen Re-Evaluation):   Initial Exercise Prescription:  Initial Exercise Prescription - 03/12/22 1500       Date of Initial Exercise RX and Referring Provider   Date 03/12/22    Referring Provider C. Deen      Oxygen   Maintain Oxygen Saturation 88% or higher      Treadmill   MPH 2.2    Grade 1    Minutes 15    METs 2.99      NuStep   Level 2    SPM 80    Minutes 15    METs 3.03       REL-XR   Level 1    Speed 50    Minutes 5    METs 3.03      Biostep-RELP   Level 1    SPM 50    Minutes 15    METs 3.03      Prescription Details   Frequency (times per week) 2    Duration Progress to 30 minutes of continuous aerobic without signs/symptoms of physical distress      Intensity   THRR 40-80% of Max Heartrate 105-139    Ratings of Perceived Exertion 11-13    Perceived Dyspnea 0-4      Progression   Progression Continue to progress workloads to maintain intensity without signs/symptoms of physical distress.      Resistance Training   Training Prescription Yes    Weight 2    Reps 10-15             Perform Capillary Blood Glucose checks as needed.  Exercise Prescription Changes:   Exercise Prescription Changes     Row Name 03/12/22 1500 03/24/22 1600 04/09/22 1300 04/22/22 1500 04/29/22 1200     Response to Exercise   Blood Pressure (Admit) 110/64 122/62 106/72 108/60 --   Blood Pressure (Exercise) 124/72 140/72 148/86 124/68 --   Blood Pressure (Exit) 114/70 118/62 108/60 102/62 --   Heart Rate (Admit) 71 bpm 72 bpm 77 bpm 75 bpm --   Heart Rate (Exercise) 90 bpm 121 bpm 117 bpm 121 bpm --   Heart Rate (Exit) 69 bpm 77 bpm 84 bpm 78 bpm --   Oxygen Saturation (Admit) 98 % -- -- -- --   Oxygen Saturation (Exercise) 98 % -- -- -- --   Oxygen Saturation (Exit) 100 % -- -- -- --   Rating of Perceived Exertion (Exercise) '11 11 14 14 '$ --   Perceived Dyspnea (Exercise) 1 -- -- -- --   Symptoms left leg joint pain 5/10 none none none --   Comments 6 MWT results 1st full day of exercise -- -- --   Duration -- Progress to 30 minutes of  aerobic without signs/symptoms of physical distress Continue with 30 min of aerobic exercise without signs/symptoms of physical distress. Continue with 30 min of aerobic exercise without signs/symptoms of physical distress. --   Intensity -- THRR unchanged THRR unchanged THRR unchanged --     Progression   Progression --  Continue to progress workloads to maintain intensity without signs/symptoms of physical distress. Continue to progress workloads to maintain intensity without signs/symptoms of physical distress. Continue to progress workloads to maintain intensity without signs/symptoms of physical distress. --   Average METs -- 2.68 2.43 2.84 --     Resistance Training   Training Prescription -- Yes Yes Yes --   Weight -- 2 lb 2 lb 2 lb --   Reps -- 10-15 10-15 10-15 --     Interval Training  Interval Training -- No No No --     Treadmill   MPH -- -- 2 -- --   Grade -- -- 1 -- --   Minutes -- -- 15 -- --   METs -- -- 2.81 -- --     Recumbant Bike   Level -- -- -- 3 --   Watts -- -- -- 14 --   Minutes -- -- -- 15 --   METs -- -- -- 2.57 --     REL-XR   Level -- -- 3 2 --   Minutes -- -- 15 15 --   METs -- -- -- 3.6 --     Biostep-RELP   Level -- '3 2 3 '$ --   Minutes -- '15 15 15 '$ --   METs -- '3 2 3 '$ --     Track   Laps -- '25 30 30 '$ --   Minutes -- '15 15 15 '$ --   METs -- 2.36 2.63 2.63 --     Home Exercise Plan   Plans to continue exercise at -- -- -- -- Home (comment)  Pt. plans to walk at home and get her own recumbent bike. She also has hand weights at home that she uses for resistance training.   Frequency -- -- -- -- Add 3 additional days to program exercise sessions.   Initial Home Exercises Provided -- -- -- -- 04/29/22     Oxygen   Maintain Oxygen Saturation -- 88% or higher -- 88% or higher --    Row Name 05/07/22 1100             Response to Exercise   Blood Pressure (Admit) 124/70       Blood Pressure (Exercise) 138/74       Blood Pressure (Exit) 124/64       Heart Rate (Admit) 83 bpm       Heart Rate (Exercise) 108 bpm       Heart Rate (Exit) 78 bpm       Rating of Perceived Exertion (Exercise) 13       Symptoms none       Duration Continue with 30 min of aerobic exercise without signs/symptoms of physical distress.       Intensity THRR unchanged          Progression   Progression Continue to progress workloads to maintain intensity without signs/symptoms of physical distress.       Average METs 2.94         Resistance Training   Training Prescription Yes       Weight 2 lb       Reps 10-15         Interval Training   Interval Training No         Recumbant Bike   Level 3       Watts 20       Minutes 15       METs 2.8         REL-XR   Level 2       Minutes 15       METs 3.4         Track   Laps 30       Minutes 15       METs 2.63         Home Exercise Plan   Plans to continue exercise at Home (comment)  Pt. plans to walk at home and get her own recumbent bike. She  also has hand weights at home that she uses for resistance training.       Frequency Add 3 additional days to program exercise sessions.       Initial Home Exercises Provided 04/29/22         Oxygen   Maintain Oxygen Saturation 88% or higher                Exercise Comments:   Exercise Comments     Row Name 03/20/22 1321           Exercise Comments First full day of exercise!  Patient was oriented to gym and equipment including functions, settings, policies, and procedures.  Patient's individual exercise prescription and treatment plan were reviewed.  All starting workloads were established based on the results of the 6 minute walk test done at initial orientation visit.  The plan for exercise progression was also introduced and progression will be customized based on patient's performance and goals.                Exercise Goals and Review:   Exercise Goals     Row Name 03/12/22 1540             Exercise Goals   Increase Physical Activity Yes       Intervention Develop an individualized exercise prescription for aerobic and resistive training based on initial evaluation findings, risk stratification, comorbidities and participant's personal goals.;Provide advice, education, support and counseling about physical activity/exercise needs.        Expected Outcomes Short Term: Attend rehab on a regular basis to increase amount of physical activity.;Long Term: Add in home exercise to make exercise part of routine and to increase amount of physical activity.;Long Term: Exercising regularly at least 3-5 days a week.       Increase Strength and Stamina Yes       Intervention Provide advice, education, support and counseling about physical activity/exercise needs.;Develop an individualized exercise prescription for aerobic and resistive training based on initial evaluation findings, risk stratification, comorbidities and participant's personal goals.       Expected Outcomes Short Term: Increase workloads from initial exercise prescription for resistance, speed, and METs.;Short Term: Perform resistance training exercises routinely during rehab and add in resistance training at home;Long Term: Improve cardiorespiratory fitness, muscular endurance and strength as measured by increased METs and functional capacity (6MWT)       Able to understand and use rate of perceived exertion (RPE) scale Yes       Intervention Provide education and explanation on how to use RPE scale       Expected Outcomes Short Term: Able to use RPE daily in rehab to express subjective intensity level;Long Term:  Able to use RPE to guide intensity level when exercising independently       Able to understand and use Dyspnea scale Yes       Intervention Provide education and explanation on how to use Dyspnea scale       Expected Outcomes Short Term: Able to use Dyspnea scale daily in rehab to express subjective sense of shortness of breath during exertion;Long Term: Able to use Dyspnea scale to guide intensity level when exercising independently       Knowledge and understanding of Target Heart Rate Range (THRR) Yes       Intervention Provide education and explanation of THRR including how the numbers were predicted and where they are located for reference       Expected Outcomes  Short Term: Able to state/look up THRR;Short Term: Able to use daily as guideline for intensity in rehab;Long Term: Able to use THRR to govern intensity when exercising independently       Able to check pulse independently Yes       Intervention Provide education and demonstration on how to check pulse in carotid and radial arteries.;Review the importance of being able to check your own pulse for safety during independent exercise       Expected Outcomes Short Term: Able to explain why pulse checking is important during independent exercise;Long Term: Able to check pulse independently and accurately       Understanding of Exercise Prescription Yes       Intervention Provide education, explanation, and written materials on patient's individual exercise prescription       Expected Outcomes Short Term: Able to explain program exercise prescription;Long Term: Able to explain home exercise prescription to exercise independently       Improve claudication pain toleration; Improve walking ability Yes       Intervention Participate in PAD/SET Rehab 2-3 days a week, walking at home as part of exercise prescription;Attend education sessions to aid in risk factor modification and understanding of disease process       Expected Outcomes Short Term: Improve walking distance/time to onset of claudication pain;Long Term: Improve score of PAD questionnaires                Exercise Goals Re-Evaluation :  Exercise Goals Re-Evaluation     Row Name 03/20/22 1321 03/24/22 1611 04/09/22 1318 04/22/22 1520 04/24/22 1126     Exercise Goal Re-Evaluation   Exercise Goals Review Able to understand and use rate of perceived exertion (RPE) scale;Able to understand and use Dyspnea scale;Knowledge and understanding of Target Heart Rate Range (THRR);Understanding of Exercise Prescription Increase Physical Activity;Increase Strength and Stamina;Understanding of Exercise Prescription Increase Physical Activity;Increase  Strength and Stamina;Understanding of Exercise Prescription Increase Physical Activity;Increase Strength and Stamina;Understanding of Exercise Prescription Increase Physical Activity;Increase Strength and Stamina;Understanding of Exercise Prescription   Comments Reviewed RPE scale, THR and program prescription with pt today.  Pt voiced understanding and was given a copy of goals to take home. Raziya is off to a good start with rehab. She completed her 1st session and was able to work at level 3 on [employer] and complete 25 laps on the track. We will continue to monitor patient as she progresses in the program. Asher is doing well in rehab. She recently improved to 30 laps on the track. She also has consistently walked at least 2 mph on the treadmill. She has also done well with level 3 on the XR. We will continue to monitor her progress in the program. Analuisa continues to do well in rehab. She did increase to level 3 on the Biostep and has been hitting her THR all sessions. She has been consistently walking 30 laps on the track and may benefit from increasing that number to therefore increase her walking speed. We will continue to monitor. Kenyonna reports walking (20 minutes, RPE 11) and using 4 lbs weights and stretching. She reports her arthritis has been flaring up recently. EP has not reviewed home exercise yet.   Expected Outcomes Short: Use RPE daily to regulate intensity. Long: Follow program prescription in THR. Short: Continue initial exercise prescription Long: Build up overall strength and stamina Short: Increase workload on the treadmill. Long: Continue to increase strength and stamina. Short: Increase laps on track beyond  30, strive for 35 Long: Continue to increase overall MET level Short: EP to go over home exercise Long: Continue to increase overall MET level    Row Name 04/29/22 1208 05/07/22 1122 05/20/22 1405         Exercise Goal Re-Evaluation   Exercise Goals Review Increase  Physical Activity;Able to understand and use Dyspnea scale;Understanding of Exercise Prescription;Knowledge and understanding of Target Heart Rate Range (THRR);Increase Strength and Stamina;Able to understand and use rate of perceived exertion (RPE) scale;Able to check pulse independently Increase Physical Activity;Understanding of Exercise Prescription;Increase Strength and Stamina Increase Physical Activity;Understanding of Exercise Prescription;Increase Strength and Stamina     Comments Reviewed home exercise with pt today.  Pt plans to walk at home and get her own recumbent bike for exercise. She also has hand weights at home that she uses for resistance training. Reviewed THR, pulse, RPE, sign and symptoms, pulse oximetery and when to call 911 or MD.  Also discussed weather considerations and indoor options.  Pt voiced understanding. Donnia continues to do well in rehab. She recently improved her overall average MET level to 2.94 METs. She has also stayed consistent with her workloads at level 2 on the XR and level 3 on the recumbent bike. She has continued to walk 30 laps on the track as well. We will continue to monitor her progress. Orella has not attendd rehab since last reivew as she has been out sick. We called patient today to follow up who stated she is still not feeling well and still trying to contact her doctor on a medication changed. We will continue to follow up with patient on any updates.     Expected Outcomes Short: Continue to walk on days away from rehab and begin using recumbent bike at home. Long: Continue to exercise independently. Short: increase workloads on seated machines. Long: Continue to improve strength and stamina. Short: Return to rehab once feeling better Long: Graduate from [facility] program              Discharge Exercise Prescription (Final Exercise Prescription Changes):  Exercise Prescription Changes - 05/07/22 1100       Response to Exercise   Blood  Pressure (Admit) 124/70    Blood Pressure (Exercise) 138/74    Blood Pressure (Exit) 124/64    Heart Rate (Admit) 83 bpm    Heart Rate (Exercise) 108 bpm    Heart Rate (Exit) 78 bpm    Rating of Perceived Exertion (Exercise) 13    Symptoms none    Duration Continue with 30 min of aerobic exercise without signs/symptoms of physical distress.    Intensity THRR unchanged      Progression   Progression Continue to progress workloads to maintain intensity without signs/symptoms of physical distress.    Average METs 2.94      Resistance Training   Training Prescription Yes    Weight 2 lb    Reps 10-15      Interval Training   Interval Training No      Recumbant Bike   Level 3    Watts 20    Minutes 15    METs 2.8      REL-XR   Level 2    Minutes 15    METs 3.4      Track   Laps 30    Minutes 15    METs 2.63      Home Exercise Plan   Plans to continue exercise at Home (comment)  Pt. plans to walk at home and get her own recumbent bike. She also has hand weights at home that she uses for resistance training.   Frequency Add 3 additional days to program exercise sessions.    Initial Home Exercises Provided 04/29/22      Oxygen   Maintain Oxygen Saturation 88% or higher             Nutrition:  Target Goals: Understanding of nutrition guidelines, daily intake of sodium '1500mg'$ , cholesterol '200mg'$ , calories 30% from fat and 7% or less from saturated fats, daily to have 5 or more servings of fruits and vegetables.  Education: All About Nutrition: -Group instruction provided by verbal, written material, interactive activities, discussions, models, and posters to present general guidelines for heart healthy nutrition including fat, fiber, MyPlate, the role of sodium in heart healthy nutrition, utilization of the nutrition label, and utilization of this knowledge for meal planning. Follow up email sent as well. Written material given at graduation. Flowsheet Row Cardiac  Rehab from 03/27/2022 in [facility] Cardiac and Pulmonary Rehab  Date 03/27/22  Educator [facility]  Instruction Review Code 1- Verbalizes Understanding       Biometrics:  Pre Biometrics - 03/12/22 1542       Pre Biometrics   Height '5\' 6"'$  (1.676 m)    Weight 173 lb 4.8 oz (78.6 kg)    Waist Circumference 39.5 inches    Hip Circumference 43 inches    Waist to Hip Ratio 0.92 %    BMI (Calculated) 27.98    Single Leg Stand 30 seconds              Nutrition Therapy Plan and Nutrition Goals:  Nutrition Therapy & Goals - 04/17/22 1318       Nutrition Therapy   Diet Heart healthy, low Na, T2DM MNT    Protein (specify units) 65-75g    Fiber 25 grams    Whole Grain Foods 3 servings    Saturated Fats 12 max. grams    Fruits and Vegetables 8 servings/day    Sodium 1.5 grams      Personal Nutrition Goals   Nutrition Goal ST: practice MyPlate guidelines, review paperwork, add nutrition to meals such as fiber, protein, and heart healthy fats LT: eat consistent meals/snacks with fiber, healthy fat, and protein.    Comments 67 y.o. F admitted to cardiac rehab for chronic systolic heart failure. PMHx includes cirrhosis of liver, hepatic encephalopathy, HTN, autoimmune hepatitis, gallstones, T2DM, OA, HLD. Last A1C 12/18/21 was 6.8. PYP Score: 59. Vegetables & Fruits 9/12. Breads, Grains & Cereals 4/12. Red & Processed Meat 8/12. Poultry 2/2. Fish & Shellfish 3/4. Beans, Nuts & Seeds 1/4. Milk & Dairy Foods 2/6. Toppings, Oils, Seasonings & Salt 13/20. Sweets, Snacks & Restaurant Food 10/14. Beverages 7/10.  Keeleigh reports that she has made changes to her diet to be more heart healthy. She reports that her appetite has changed and her taste has changed since taking her medications. She reports her BG has been running around 90; she has been eating more sweets she feels due to lower intake such as fruit such as bananas, apples, blueberries, cherrys. B: 5am-7am coffee (cream (powdered and liquid) with no  sugar). 1030am (will eat something between 930 and 10 am when going to rehab) She no longer likes eggs, but she will have things like toast and applesauce and berries, sometimes scrambled eggs with something like tater tots, oatmeal with cinnamon and sugar, sometimes cold cereal like honey bunches  of oats or rice crispies with banana or cheerios. S: pretzles - she has reduced chips and cheese D: 830 or 9pm sometimes: baked salmon, broccoli, cauliflower, cabbage, potato, pasta, and beans, as well as salad. She enjoys greek yogurt, but she gets tired of it quickly. Discussed ways that she can add in protein, healthy fat, and fiber. Reviewed heart healthy eating and T2DM MNT.      Intervention Plan   Intervention Prescribe, educate and counsel regarding individualized specific dietary modifications aiming towards targeted core components such as weight, hypertension, lipid management, diabetes, heart failure and other comorbidities.    Expected Outcomes Short Term Goal: Understand basic principles of dietary content, such as calories, fat, sodium, cholesterol and nutrients.;Short Term Goal: A plan has been developed with personal nutrition goals set during dietitian appointment.;Long Term Goal: Adherence to prescribed nutrition plan.             Nutrition Assessments:  MEDIFICTS Score Key: ?70 Need to make dietary changes  40-70 Heart Healthy Diet ? 40 Therapeutic Level Cholesterol Diet  Flowsheet Row Cardiac Rehab from 03/12/2022 in [facility] Cardiac and Pulmonary Rehab  Picture Your Plate Total Score on Admission 59      Picture Your Plate Scores: <24 Unhealthy dietary pattern with much room for improvement. 41-50 Dietary pattern unlikely to meet recommendations for good health and room for improvement. 51-60 More healthful dietary pattern, with some room for improvement.  >60 Healthy dietary pattern, although there may be some specific behaviors that could be improved.    Nutrition Goals  Re-Evaluation:  Nutrition Goals Re-Evaluation     [place] Name 03/27/22 1118             Goals   Current Weight 174 lb (78.9 kg)       Nutrition Goal Make an eating schedule       Comment Bralyn states she knows what to do but has a sporadic eating schedule. Patient deferres dietitian.       Expected Outcome Short: get on an eating schedule. Long: maintain a diet plan that pertains to her.                Nutrition Goals Discharge (Final Nutrition Goals Re-Evaluation):  Nutrition Goals Re-Evaluation - 03/27/22 1118       Goals   Current Weight 174 lb (78.9 kg)    Nutrition Goal Make an eating schedule    Comment Gaylene states she knows what to do but has a sporadic eating schedule. Patient deferres dietitian.    Expected Outcome Short: get on an eating schedule. Long: maintain a diet plan that pertains to her.             Psychosocial: Target Goals: Acknowledge presence or absence of significant depression and/or stress, maximize coping skills, provide positive support system. Participant is able to verbalize types and ability to use techniques and skills needed for reducing stress and depression.   Education: Stress, Anxiety, and Depression - Group verbal and visual presentation to define topics covered.  Reviews how body is impacted by stress, anxiety, and depression.  Also discusses healthy ways to reduce stress and to treat/manage anxiety and depression.  Written material given at graduation.   Education: Sleep Hygiene -Provides group verbal and written instruction about how sleep can affect your health.  Define sleep hygiene, discuss sleep cycles and impact of sleep habits. Review good sleep hygiene tips.    Initial Review & Psychosocial Screening:  Initial Psych Review & Screening -  03/04/22 1039       Initial Review   Current issues with Current Stress Concerns    Source of Stress Concerns Chronic Illness    Comments new diagnosis, new ICD/Pacer  still  working on accepting the stress  keeping positive.      Family Dynamics   Good Support System? Yes   9 siblings, 3 children  and grandchildren.     Barriers   Psychosocial barriers to participate in program There are no identifiable barriers or psychosocial needs.      Screening Interventions   Interventions Encouraged to exercise;To provide support and resources with identified psychosocial needs;Provide feedback about the scores to participant    Expected Outcomes Short Term goal: Utilizing psychosocial counselor, staff and physician to assist with identification of specific Stressors or current issues interfering with healing process. Setting desired goal for each stressor or current issue identified.;Long Term Goal: Stressors or current issues are controlled or eliminated.;Short Term goal: Identification and review with participant of any Quality of Life or Depression concerns found by scoring the questionnaire.;Long Term goal: The participant improves quality of Life and PHQ9 Scores as seen by post scores and/or verbalization of changes             Quality of Life Scores:   Quality of Life - 03/12/22 1545       Quality of Life   Select Quality of Life      Quality of Life Scores   Health/Function Pre 14.87 %    Socioeconomic Pre 18.5 %    Psych/Spiritual Pre 15.71 %    Family Pre 15.6 %    GLOBAL Pre 15.97 %            Scores of 19 and below usually indicate a poorer quality of life in these areas.  A difference of  2-3 points is a clinically meaningful difference.  A difference of 2-3 points in the total score of the Quality of Life Index has been associated with significant improvement in overall quality of life, self-image, physical symptoms, and general health in studies assessing change in quality of life.  PHQ-9: Review Flowsheet  More data exists      04/10/2022 03/27/2022 03/12/2022 09/04/2021 03/19/2021  Depression screen PHQ 2/9  Decreased Interest 0 0 2 0 3   Down, Depressed, Hopeless '1 1 2 '$ 0 2  PHQ - 2 Score '1 1 4 '$ 0 5  Altered sleeping 0 0 1 - -  Tired, decreased energy 0 1 1 - -  Change in appetite '1 1 1 '$ - -  Feeling bad or failure about yourself  0 2 0 - -  Trouble concentrating 0 1 0 - -  Moving slowly or fidgety/restless 0 0 0 - -  Suicidal thoughts 0 0 0 - -  PHQ-9 Score '2 6 7 '$ - -  Difficult doing work/chores Not difficult at all Not difficult at all Not difficult at all - -   Interpretation of Total Score  Total Score Depression Severity:  1-4 = Minimal depression, 5-9 = Mild depression, 10-14 = Moderate depression, 15-19 = Moderately severe depression, 20-27 = Severe depression   Psychosocial Evaluation and Intervention:  Psychosocial Evaluation - 03/04/22 1101       Psychosocial Evaluation & Interventions   Interventions Encouraged to exercise with the program and follow exercise prescription    Comments Brinlee has been dealing with multiple health issues and then new diagnosis of Heart Failure. She has had recent implantation of  an ICD/Pacemaker. THis has all become overwhelming to her at times. She states she does her best to maintain a positive attitude and continue to manage her day to day life. She has a large support team with 9 siblings, 3 children and her grandchildren. SHe is aware that if she starts feeling depression that she cannot clear, that she will reach out to her physician for meds if needed.  She stated that there are days just talking about all her health issues can bring tears to her eyes.  She is a cigarette smoker and has decreased her habit to a little under 1/2 pack a day. She has not set a quit date yet. She has reached out to the [facility] Quitline and received nicotene patches that she has used some. She has chronic pain . She has to manage her medications to not cause her to sleep all the time. She has had liver disease for over 20 years.    Expected Outcomes STG She is able to attend all scheduled sessions, she  will continue to work on smoking cessation, and she will reach out to her physician if her depression symptoms become daily.  LTG: She will continue to progress with her exercise and manage any depression symptoms that occur after discharge    Continue Psychosocial Services  Follow up required by staff             Psychosocial Re-Evaluation:  Psychosocial Re-Evaluation     [place] Name 03/27/22 1125 04/10/22 1106 04/24/22 1123         Psychosocial Re-Evaluation   Current issues with Current Stress Concerns -- Current Stress Concerns     Comments Reviewed patient health questionnaire (PHQ-9) with patient for follow up. Previously, patients score indicated signs/symptoms of depression.  Reviewed to see if patient is improving symptom wise while in program.  Score improved and patient states that it is because she has had a little more energy since she has started the program. Reviewed patient health questionnaire (PHQ-9) with patient for follow up. Previously, patients score indicated signs/symptoms of depression.  Reviewed to see if patient is improving symptom wise while in program.  Score improved and patient states that it is because she is feeling better overall.  She still has some down days but overall improving. Cassandra reports chest pain yesterday all day and is stressed from that. She reports that she is not feeling it today aside from walking. Heaviness, tightness, and sharp pain - she did not tell her MD yet - highly encouraged her to speak with MD regarding this. Overall Mattisen reports feeling better mentally and physcially.     Expected Outcomes Short: Continue to attend [facility] regularly for regular exercise and social engagement. Long: Continue to improve symptoms and manage a positive mental state. Short: Continue to attend [facility] regularly for regular exercise and social engagement. Long: Continue to improve symptoms and manage a positive mental state. Short: Continue  to attend [facility] regularly for regular exercise and social engagement. Long: Continue to improve symptoms and manage a positive mental state.     Interventions Encouraged to attend Cardiac Rehabilitation for the exercise Encouraged to attend Cardiac Rehabilitation for the exercise;Stress management education Encouraged to attend Cardiac Rehabilitation for the exercise;Stress management education     Continue Psychosocial Services  Follow up required by staff Follow up required by staff Follow up required by staff              Psychosocial Discharge (Final Psychosocial Re-Evaluation):  Psychosocial Re-Evaluation - 04/24/22 1123       Psychosocial Re-Evaluation   Current issues with Current Stress Concerns    Comments Damonique reports chest pain yesterday all day and is stressed from that. She reports that she is not feeling it today aside from walking. Heaviness, tightness, and sharp pain - she did not tell her MD yet - highly encouraged her to speak with MD regarding this. Overall Isobel reports feeling better mentally and physcially.    Expected Outcomes Short: Continue to attend [facility] regularly for regular exercise and social engagement. Long: Continue to improve symptoms and manage a positive mental state.    Interventions Encouraged to attend Cardiac Rehabilitation for the exercise;Stress management education    Continue Psychosocial Services  Follow up required by staff             Vocational Rehabilitation: Provide vocational rehab assistance to qualifying candidates.   Vocational Rehab Evaluation & Intervention:  Vocational Rehab - 03/04/22 1042       Initial Vocational Rehab Evaluation & Intervention   Assessment shows need for Vocational Rehabilitation No      Vocational Rehab Re-Evaulation   Comments retired      Discharge Vocational Rehab   Discharge Vocational Rehabilitation retired             Education: Education Goals:  Education classes will be provided on a variety of topics geared toward better understanding of heart health and risk factor modification. Participant will state understanding/return demonstration of topics presented as noted by education test scores.  Learning Barriers/Preferences:   General Cardiac Education Topics:  AED/CPR: - Group verbal and written instruction with the use of models to demonstrate the basic use of the AED with the basic ABC's of resuscitation.   Anatomy and Cardiac Procedures: - Group verbal and visual presentation and models provide information about basic cardiac anatomy and function. Reviews the testing methods done to diagnose heart disease and the outcomes of the test results. Describes the treatment choices: Medical Management, Angioplasty, or Coronary Bypass Surgery for treating various heart conditions including Myocardial Infarction, Angina, Valve Disease, and Cardiac Arrhythmias.  Written material given at graduation.   Medication Safety: - Group verbal and visual instruction to review commonly prescribed medications for heart and lung disease. Reviews the medication, class of the drug, and side effects. Includes the steps to properly store meds and maintain the prescription regimen.  Written material given at graduation.   Intimacy: - Group verbal instruction through game format to discuss how heart and lung disease can affect sexual intimacy. Written material given at graduation.. Flowsheet Row Cardiac Rehab from 03/27/2022 in [facility] Cardiac and Pulmonary Rehab  Date 03/20/22  Educator [facility]  Instruction Review Code 1- Verbalizes Understanding       Know Your Numbers and Heart Failure: - Group verbal and visual instruction to discuss disease risk factors for cardiac and pulmonary disease and treatment options.  Reviews associated critical values for Overweight/Obesity, Hypertension, Cholesterol, and Diabetes.  Discusses basics of heart failure:  signs/symptoms and treatments.  Introduces Heart Failure Zone chart for action plan for heart failure.  Written material given at graduation.   Infection Prevention: - Provides verbal and written material to individual with discussion of infection control including proper hand washing and proper equipment cleaning during exercise session. Flowsheet Row Cardiac Rehab from 03/27/2022 in [facility] Cardiac and Pulmonary Rehab  Date 03/12/22  Educator [facility]  Instruction Review Code 1- [employer]  Prevention: - Provides verbal and written material to individual with discussion of falls prevention and safety. Flowsheet Row Cardiac Rehab from 03/27/2022 in [facility] Cardiac and Pulmonary Rehab  Date 03/12/22  Educator [facility]  Instruction Review Code 1- Verbalizes Understanding       Other: -Provides group and verbal instruction on various topics (see comments)   Knowledge Questionnaire Score:  Knowledge Questionnaire Score - 03/12/22 1549       Knowledge Questionnaire Score   Pre Score Patient didn't have time to fill out in orientaiton, knowledge quiz sent home for patient to complete and bring back.             Core Components/Risk Factors/Patient Goals at Admission:  Personal Goals and Risk Factors at Admission - 03/12/22 1547       Core Components/Risk Factors/Patient Goals on Admission    Weight Management Yes    Intervention Weight Management: Provide education and appropriate resources to help participant work on and attain dietary goals.;Weight Management: Develop a combined nutrition and exercise program designed to reach desired caloric intake, while maintaining appropriate intake of nutrient and fiber, sodium and fats, and appropriate energy expenditure required for the weight goal.    Admit Weight 173 lb 4.8 oz (78.6 kg)    Goal Weight: Short Term 165 lb (74.8 kg)    Goal Weight: Long Term 155 lb (70.3 kg)    Expected Outcomes Short Term: Continue to  assess and modify interventions until short term weight is achieved;Long Term: Adherence to nutrition and physical activity/exercise program aimed toward attainment of established weight goal;Weight Loss: Understanding of general recommendations for a balanced deficit meal plan, which promotes 1-2 lb weight loss per week and includes a negative energy balance of [phone] kcal/d    Tobacco Cessation Yes    Number of packs per day 1/2 pack  a day   Laveda is a current tobacco user. Intervention for tobacco cessation was provided at the initial medical review. She was asked about readiness to quit and reported that she has not set a quit date. She has weaned down to under a pack a day and is working towards a quit.   . Patient was advised and educated about tobacco cessation using combination therapy, tobacco cessation classes, quit line, and quit smoking apps. Patient demonstrated understanding of this material. Staff will continue to provide encouragement and follow up with the patient throughout the program.  She has already reached out to the [facility] Quitline and received patches. She has used some patches.    Intervention Assist the participant in steps to quit. Provide individualized education and counseling about committing to Tobacco Cessation, relapse prevention, and pharmacological support that can be provided by physician.;Advice worker, assist with locating and accessing local/national Quit Smoking programs, and support quit date choice.    Expected Outcomes Short Term: Will demonstrate readiness to quit, by selecting a quit date.;Short Term: Will quit all tobacco product use, adhering to prevention of relapse plan.;Long Term: Complete abstinence from all tobacco products for at least 12 months from quit date.    Diabetes Yes    Intervention Provide education about signs/symptoms and action to take for hypo/hyperglycemia.;Provide education about proper nutrition, including hydration, and  aerobic/resistive exercise prescription along with prescribed medications to achieve blood glucose in normal ranges: Fasting glucose 65-99 mg/dL    Expected Outcomes Short Term: Participant verbalizes understanding of the signs/symptoms and immediate care of hyper/hypoglycemia, proper foot care and importance of medication, aerobic/resistive exercise and nutrition  plan for blood glucose control.;Long Term: Attainment of HbA1C < 7%.    Heart Failure Yes    Intervention Provide a combined exercise and nutrition program that is supplemented with education, support and counseling about heart failure. Directed toward relieving symptoms such as shortness of breath, decreased exercise tolerance, and extremity edema.    Expected Outcomes Improve functional capacity of life;Short term: Attendance in program 2-3 days a week with increased exercise capacity. Reported lower sodium intake. Reported increased fruit and vegetable intake. Reports medication compliance.;Short term: Daily weights obtained and reported for increase. Utilizing diuretic protocols set by physician.;Long term: Adoption of self-care skills and reduction of barriers for early signs and symptoms recognition and intervention leading to self-care maintenance.             Education:Diabetes - Individual verbal and written instruction to review signs/symptoms of diabetes, desired ranges of glucose level fasting, after meals and with exercise. Acknowledge that pre and post exercise glucose checks will be done for 3 sessions at entry of program. [place] from 03/27/2022 in [facility] Cardiac and Pulmonary Rehab  Date 03/12/22  Educator [facility]  Instruction Review Code 1- Verbalizes Understanding       Core Components/Risk Factors/Patient Goals Review:   Goals and Risk Factor Review     Row Name 03/27/22 1124 04/24/22 1132           Core Components/Risk Factors/Patient Goals Review   Personal Goals Review Weight  Management/Obesity;Diabetes Weight Management/Obesity;Diabetes      Review Mycala has been doing well in class and states that she would like to lose a little weight. Her blood sugars have been in check and she has been checking them at home. She is off insulin since she is on Jardiance now. Marguerite has reported doing well in rehab and feels better overall physically as well as mentally. She reports her BG has been running in the 80s - reviewed on what to do in case of low BG. Weight has been stable since starting rehab. She is taking all of her medications as directed, but feels she is having side effects with Jardiance and is going to tell her MD that she would like to switch back to insulin. Stepahnie reports chest pain yesterday all day and is stressed from that. She reports that she is not feeling it today aside from walking. Heaviness, tightness, and sharp pain - she did not tell her MD yet - highly encouraged her to speak with MD regarding this.      Expected Outcomes Short: lose some weight. Long: maintain weight loss independently. ST: speak with MD regarding recent chest pain and medication swap from [place] LT: monitor lifestyle               Core Components/Risk Factors/Patient Goals at Discharge (Final Review):   Goals and Risk Factor Review - 04/24/22 1132       Core Components/Risk Factors/Patient Goals Review   Personal Goals Review Weight Management/Obesity;Diabetes    Review Makayle has reported doing well in rehab and feels better overall physically as well as mentally. She reports her BG has been running in the 80s - reviewed on what to do in case of low BG. Weight has been stable since starting rehab. She is taking all of her medications as directed, but feels she is having side effects with Jardiance and is going to tell her MD that she would like to switch back to insulin. Clela reports chest pain yesterday all day  and is stressed from that. She reports that she is not  feeling it today aside from walking. Heaviness, tightness, and sharp pain - she did not tell her MD yet - highly encouraged her to speak with MD regarding this.    Expected Outcomes ST: speak with MD regarding recent chest pain and medication swap from [place] [facility]: monitor lifestyle             ITP Comments:  ITP Comments     Row Name 03/04/22 1100 03/12/22 1529 03/20/22 1320 04/09/22 0827 04/17/22 1350   ITP Comments Virtual orientation call completed today. shehas an appointment on Date: 03/12/2022  for EP eval and gym Orientation.  Documentation of diagnosis can be found in [facility] Date: 11/05/2021 .     Keyira is a current tobacco user. Intervention for tobacco cessation was provided at the initial medical review. She was asked about readiness to quit and reported that she has not set a quit date. She has weaned down to under a pack a day and is working towards a quit.   . Patient was advised and educated about tobacco cessation using combination therapy, tobacco cessation classes, quit line, and quit smoking apps. Patient demonstrated understanding of this material. Staff will continue to provide encouragement and follow up with the patient throughout the program.  She has already reached out to the [facility] Quitline and received patches. She has used some patches. Completed 6MWT and gym orientation. Initial ITP created and sent for review to Dr. Emily Filbert, Medical Director.Samhitha  is a current tobacco user. Intervention for tobacco cessation was provided at the initial medical review. She was asked about readiness to quit and reported she has called the quit line and is working with the resources they have given her but does not currently have a quit date.  . Patient was advised and educated about tobacco cessation using combination therapy, tobacco cessation classes, quit line, and quit smoking apps. Patient demonstrated understanding of this material. Staff will continue to provide encouragement and  follow up with the patient throughout the program. First full day of exercise!  Patient was oriented to gym and equipment including functions, settings, policies, and procedures.  Patient's individual exercise prescription and treatment plan were reviewed.  All starting workloads were established based on the results of the 6 minute walk test done at initial orientation visit.  The plan for exercise progression was also introduced and progression will be customized based on patient's performance and goals. 30 Day review completed. Medical Director ITP review done, changes made as directed, and signed approval by Medical Director.   New to program Completed initial RD consultation    Deary Name 05/07/22 1057 05/20/22 1405 05/27/22 1115       ITP Comments 30 Day review completed. Medical Director ITP review done, changes made as directed, and signed approval by Medical Director. Called patient to follow up on cardiac rehab, she is still recovery from being sick and still not feeling the best. She was diagnosed with a respiratory virus and sinusitis with antibiotics. She is also in the process of dealing with medication changes and has not heard back from her doctors after sending a message. I encouraged her to call today to follow up. Canceled rehab appointments until next week and told her to call us on updates and how she is feeling. Patient called today to let us know that she will not attend rehab due to her still experiencing symptoms of sickness. She last attended  cardiac rehab on 04/29/2022. We will continue to follow up with Trayonna to see when she plans on returning to rehab.              Comments: Discharge ITP

## 2022-07-21 ENCOUNTER — Encounter: Payer: Self-pay | Admitting: Pharmacist

## 2022-07-21 DIAGNOSIS — G720 Drug-induced myopathy: Secondary | ICD-10-CM

## 2022-07-21 NOTE — Progress Notes (Signed)
[place] [facility]) [place] Team Statin Quality Measure Assessment  07/21/2022  MIKERA SCHWEGEL Aug 11, 1955 [id number]  Per review of chart and payor information, patient has a diagnosis of diabetes but is not currently filling a statin prescription.  This places patient into the Statin Use In Patients with Diabetes (SUPD) measure for CMS.    Patient has documented trials of atorvastatin  with reported myopathy, but no corresponding CPT codes that would exclude patient from SUPD measure.(For the current benefit year)  If deemed therapeutically appropriate, a statin exclusion code could be associated with the upcoming visit.  The 10-year ASCVD risk score (Arnett DK, et al., 2019) is: 19%   Values used to calculate the score:     Age: 67 years     Sex: Female     Is Non-Hispanic African American: Yes     Diabetic: Yes     Tobacco smoker: Yes     Systolic Blood Pressure: 99991111 mmHg     Is BP treated: No     HDL Cholesterol: 43 mg/dL     Total Cholesterol: 141 mg/dL 03/20/2021     Component Value Date/Time   CHOL 159 03/20/2021 0834   TRIG 58 03/20/2021 0834   HDL 69 03/20/2021 0834   CHOLHDL 2.3 03/20/2021 0834   CHOLHDL 3 11/27/2020 0850   VLDL 14.6 11/27/2020 0850   LDLCALC 78 03/20/2021 0834   LDLCALC 151 (H) 03/01/2018 1534   LDLDIRECT 60.0 12/18/2021 1126    Please consider ONE of the following recommendations:  Initiate high intensity statin Atorvastatin 40 mg once daily, #90, 3 refills   Rosuvastatin 20 mg once daily, #90, 3 refills    Initiate moderate intensity          statin with reduced frequency if prior          statin intolerance 1x weekly, #13, 3 refills   2x weekly, #26, 3 refills   3x weekly, #39, 3 refills    Code for past statin intolerance or  other exclusions (required annually)  Provider Requirements: Associate code during an office visit or telehealth encounter  Drug Induced Myopathy G72.0   Myopathy, unspecified G72.9    Myositis, unspecified M60.9   Rhabdomyolysis M62.82   Cirrhosis of liver K74.69   Prediabetes R73.03   PCOS E28.2   Plan: Route note to PCP prior to upcoming appointment.  Elayne Guerin, PharmD, [place] Clinical Pharmacist [phone]

## 2022-07-22 ENCOUNTER — Encounter: Payer: Medicare HMO | Admitting: Family Medicine

## 2022-08-24 DIAGNOSIS — F32A Depression, unspecified: Secondary | ICD-10-CM | POA: Insufficient documentation

## 2022-08-27 DIAGNOSIS — D696 Thrombocytopenia, unspecified: Secondary | ICD-10-CM | POA: Insufficient documentation

## 2022-09-15 DIAGNOSIS — K5900 Constipation, unspecified: Secondary | ICD-10-CM | POA: Insufficient documentation

## 2022-10-30 DIAGNOSIS — K811 Chronic cholecystitis: Secondary | ICD-10-CM | POA: Insufficient documentation

## 2023-04-16 ENCOUNTER — Encounter: Payer: Self-pay | Admitting: Pain Medicine

## 2023-04-16 NOTE — Progress Notes (Unsigned)
Patient: Shannon [name]  Service Category: E/M  Provider: Oswaldo Done, MD  DOB: December 05, 1955  DOS: 04/20/2023  Referring Provider: Carnella Guadalajara, DO  MRN: [id number]  Setting: Ambulatory outpatient  PCP: Vernard Gambles, MD  Type: New Patient  Specialty: Interventional Pain Management    Location: Office  Delivery: Face-to-face     Primary Reason(s) for Visit: Encounter for initial evaluation of one or more chronic problems (new to examiner) potentially causing chronic pain, and posing a threat to normal musculoskeletal function. (Level of risk: High) CC: No chief complaint on file.  HPI  Shannon [name] is a 67 y.o. year old, female patient, who comes for the first time to our practice referred by Carnella Guadalajara, DO for our initial evaluation of her chronic pain. She has Steroid-induced diabetes (HCC); Vitamin D deficiency; Autoimmune hepatitis (HCC); Systemic lupus erythematosus (HCC); Hyperpigmentation; Candidiasis; Lumbosacral radiculopathy at L5; Osteoarthritis of knee; Fibromyalgia; HLD (hyperlipidemia); Tobacco abuse; Portal hypertensive gastropathy (HCC); Bronchitis; Cirrhosis of liver (HCC); Sacroiliac joint pain; Arthritis; Chronic pain; History of colon polyps; Mild intermittent asthma with exacerbation; Calculus of gallbladder without cholecystitis without obstruction; Type 2 diabetes mellitus without complication, with long-term current use of insulin (HCC); Hirsutism; Osteoarthritis of both shoulders; Adrenal insufficiency due to corticosteroid withdrawal (HCC); Anxiety; HFrEF (heart failure with reduced ejection fraction) (HCC); Myalgia due to statin; Low serum vitamin B12; Atrial fibrillation with RVR (HCC); Dilated cardiomyopathy (HCC); Bradycardia; Acute pulmonary edema (HCC); Anemia; Bilateral lower extremity edema; Chronic cholecystitis; Constipation; Depression; Diabetes mellitus due to underlying condition with hyperosmolarity without coma, without long-term current use of insulin  (HCC); Diarrhea; Disorder of pancreatic internal secretion; Dyschromia; Encephalopathy, portal systemic (HCC); Hypomagnesemia; Pancreas cyst; Pleural effusion; Portal hypertension (HCC); Secondary esophageal varices without bleeding (HCC); Seronegative spondylitis (HCC); Symptomatic PVCs; Thrombocytopenia, unspecified (HCC); Abdominal pain; Elevated troponin; Pharmacologic therapy; Disorder of skeletal system; and Problems influencing health status on their problem list. Today she comes in for evaluation of her No chief complaint on file.  Pain Assessment: Location:     Radiating:   Onset:   Duration:   Quality:   Severity:  /10 (subjective, self-reported pain score)  Effect on ADL:   Timing:   Modifying factors:   BP:    HR:    Onset and Duration: {Hx; Onset and Duration:210120511} Cause of pain: {Hx; Cause:210120521} Severity: {Pain Severity:210120502} Timing: {Symptoms; Timing:210120501} Aggravating Factors: {Causes; Aggravating pain factors:210120507} Alleviating Factors: {Causes; Alleviating Factors:210120500} Associated Problems: {Hx; Associated problems:210120515} Quality of Pain: {Hx; Symptom quality or Descriptor:210120531} Previous Examinations or Tests: {Hx; Previous examinations or test:210120529} Previous Treatments: {Hx; Previous Treatment:210120503}  Shannon [name] is being evaluated for possible interventional pain management therapies for the treatment of her chronic pain.  Discussed the use of AI scribe software for clinical note transcription with the patient, who gave verbal consent to proceed.  History of Present Illness           *** Shannon [name] has been informed that this initial visit was an evaluation only.  On the follow up appointment I will go over the results, including ordered tests and available interventional therapies. At that time she will have the opportunity to decide whether to proceed with offered therapies or not. In the event that Shannon [name]  prefers avoiding interventional options, this will conclude our involvement in the case.  Medication management recommendations may be provided upon request.  Patient informed that diagnostic tests may be ordered to assist in identifying underlying causes, narrow the list of differential diagnoses  and aid in determining candidacy for (or contraindications to) planned therapeutic interventions.  Historic Controlled Substance Pharmacotherapy Review  PMP and historical list of controlled substances: ***  Most recently prescribed opioid analgesics:   *** MME/day: *** mg/day  Historical Monitoring: The patient  reports no history of drug use. List of prior UDS Testing: No results found for: "MDMA", "COCAINSCRNUR", "PCPSCRNUR", "PCPQUANT", "CANNABQUANT", "THCU", "ETH", "CBDTHCR", "D8THCCBX", "D9THCCBX" Historical Background Evaluation: [place] PMP: PDMP reviewed during this encounter. Review of the past 61-months conducted.             PMP NARX Score Report:  Narcotic: 030 Sedative: 010 Stimulant: 000 [place] Department of public safety, offender search: Engineer, mining Information) Non-contributory Risk Assessment Profile: Aberrant behavior: None observed or detected today Risk factors for fatal opioid overdose: None identified today PMP NARX Overdose Risk Score: 150 Fatal overdose hazard ratio (HR): Calculation deferred Non-fatal overdose hazard ratio (HR): Calculation deferred Risk of opioid abuse or dependence: 0.7-3.0% with doses <= 36 MME/day and 6.1-26% with doses >= 120 MME/day. Substance use disorder (SUD) risk level: See below Personal History of Substance Abuse (SUD-Substance use disorder):  Alcohol:    Illegal Drugs:    Rx Drugs:    ORT Risk Level calculation:    ORT Scoring interpretation table:  Score <3 = Low Risk for SUD  Score between 4-7 = Moderate Risk for SUD  Score >8 = High Risk for Opioid Abuse   PHQ-2 Depression Scale:  Total score:    PHQ-2 Scoring interpretation table:  (Score and probability of major depressive disorder)  Score 0 = No depression  Score 1 = 15.4% Probability  Score 2 = 21.1% Probability  Score 3 = 38.4% Probability  Score 4 = 45.5% Probability  Score 5 = 56.4% Probability  Score 6 = 78.6% Probability   PHQ-9 Depression Scale:  Total score:    PHQ-9 Scoring interpretation table:  Score 0-4 = No depression  Score 5-9 = Mild depression  Score 10-14 = Moderate depression  Score 15-19 = Moderately severe depression  Score 20-27 = Severe depression (2.4 times higher risk of SUD and 2.89 times higher risk of overuse)   Pharmacologic Plan: As per protocol, I have not taken over any controlled substance management, pending the results of ordered tests and/or consults.            Initial impression: Pending review of available data and ordered tests.  Meds   Current Outpatient Medications:    acetaminophen (TYLENOL) 650 MG CR tablet, Take by mouth., Disp: , Rfl:    albuterol (VENTOLIN HFA) 108 (90 Base) MCG/ACT inhaler, USE 1 TO 2 INHALATIONS BY  MOUTH INTO THE LUNGS EVERY  6 HOURS AS NEEDED FOR  WHEEZING OR SHORTNESS OF  BREATH, Disp: 34 g, Rfl: 2   apixaban (ELIQUIS) 5 MG TABS tablet, Take 1 tablet (5 mg total) by mouth 2 (two) times daily., Disp: , Rfl: 0   azaTHIOprine (IMURAN) 50 MG tablet, Take 4 tablets (200 mg total) by mouth daily., Disp: , Rfl:    blood glucose meter kit and supplies KIT, Dispense based on patient and insurance preference. Use up to four times daily as directed. (FOR ICD-9 250.00, 250.01)., Disp: 1 each, Rfl: 0   blood glucose meter kit and supplies, Dispense based on patient and insurance preference. Use up to four times daily as directed. (FOR ICD-10 E10.9, E11.9)., Disp: 1 each, Rfl: 0   Blood Glucose Monitoring Suppl (ONE TOUCH ULTRA SYSTEM KIT) W/DEVICE KIT, Test  blood sugar as directed Dx E09.9, Disp: 1 each, Rfl: 0   celecoxib (CELEBREX) 100 MG capsule, Take 100 mg by mouth daily., Disp: , Rfl:     Cholecalciferol (VITAMIN D3) 5000 units TABS, Take by mouth. , Disp: , Rfl:    empagliflozin (JARDIANCE) 25 MG TABS tablet, Take 1 tablet (25 mg total) by mouth daily before breakfast., Disp: , Rfl:    furosemide (LASIX) 40 MG tablet, Take 1 tablet (40 mg total) by mouth daily., Disp: , Rfl: 0   glucose blood (ONETOUCH VERIO) test strip, 1 each by Other route 4 (four) times daily. Use as instructed, Disp: 400 strip, Rfl: 3   insulin degludec (TRESIBA FLEXTOUCH) 100 UNIT/ML FlexTouch Pen, Inject 10 Units into the skin daily., Disp: 15 mL, Rfl: 11   Insulin Pen Needle (PEN NEEDLES) 31G X 6 MM MISC, Use with [place] as instructed daily, Disp: 100 each, Rfl: 11   lactulose (CHRONULAC) 10 GM/15ML solution, TAKE 30 ML BY MOUTH 4 TIMES DAILY AS NEEDED (Patient taking differently: Take 30 g by mouth. 50-60 g 1x per day not 30 g qid), Disp: 10800 mL, Rfl: 3   lidocaine (LIDODERM) 5 %, Place 1 patch onto the skin daily. Remove & Discard patch within 12 hours or as directed by MD, Disp: 30 patch, Rfl: 5   loratadine (CLARITIN) 10 MG tablet, Take 10 mg by mouth every other day as needed for allergies., Disp: , Rfl:    losartan (COZAAR) 25 MG tablet, Take 1 tablet by mouth daily., Disp: , Rfl:    Magnesium Oxide 500 MG TABS, Take by mouth., Disp: , Rfl:    metFORMIN (GLUCOPHAGE-XR) 500 MG 24 hr tablet, TAKE 2 TABLETS BY MOUTH TWICE  DAILY WITH A MEAL, Disp: 400 tablet, Rfl: 2   metoprolol tartrate (LOPRESSOR) 50 MG tablet, Take 1 tablet (50 mg total) by mouth 2 (two) times daily. (Patient taking differently: Take 25 mg by mouth 2 (two) times daily.), Disp: , Rfl: 0   miconazole (MICONAZOLE 3) 200 MG vaginal suppository, Place 1 suppository (200 mg total) vaginally at bedtime., Disp: 3 suppository, Rfl: 0   nystatin (MYCOSTATIN) 100000 UNIT/ML suspension, Take 5 mLs (500,000 Units total) by mouth 4 (four) times daily. Hold and spit, Disp: 473 mL, Rfl: 0   omeprazole (PRILOSEC) 40 MG capsule, TAKE 1 CAPSULE BY MOUTH   DAILY 1/2 HOUR BEFORE A  MEAL, Disp: 90 capsule, Rfl: 3   predniSONE (DELTASONE) 1 MG tablet, Take 3 mg by mouth daily., Disp: , Rfl:    Simethicone 180 MG CAPS, Take by mouth., Disp: , Rfl:    spironolactone (ALDACTONE) 25 MG tablet, Take 1 tablet (25 mg total) by mouth daily., Disp: , Rfl: 0   traMADol (ULTRAM) 50 MG tablet, Take by mouth., Disp: , Rfl:    traZODone (DESYREL) 50 MG tablet, Take 25 mg by mouth at bedtime as needed., Disp: , Rfl:    vitamin B-12 (CYANOCOBALAMIN) 100 MCG tablet, Take 100 mcg by mouth daily., Disp: , Rfl:   Imaging Review   Complexity Note: No results found under the [facility] electronic medical record.                         ROS  Cardiovascular: {Hx; Cardiovascular History:210120525} Pulmonary or Respiratory: {Hx; Pumonary and/or Respiratory History:210120523} Neurological: {Hx; Neurological:210120504} Psychological-Psychiatric: {Hx; Psychological-Psychiatric History:210120512} Gastrointestinal: {Hx; Gastrointestinal:210120527} Genitourinary: {Hx; Genitourinary:210120506} Hematological: {Hx; Hematological:210120510} Endocrine: {Hx; Endocrine history:210120509} Rheumatologic: {Hx; Rheumatological:210120530} Musculoskeletal: {Hx;  Musculoskeletal:210120528} Work History: {Hx; Work history:210120514}  Allergies  Ms. Brozek is allergic to hydrocodone, lipitor [atorvastatin calcium], milk-related compounds, oxycodone, wellbutrin [bupropion], and adhesive [tape].  Laboratory Chemistry Profile   Renal Lab Results  Component Value Date   BUN 14 12/18/2021   CREATININE 0.71 12/18/2021   BCR 12 03/20/2021   GFR 90.32 12/18/2021   SPECGRAV 1.018 03/20/2021   PHUR 6.5 03/20/2021   PROTEINUR Negative 03/20/2021     Electrolytes Lab Results  Component Value Date   NA 142 12/18/2021   K 4.1 12/18/2021   CL 105 12/18/2021   CALCIUM 9.1 12/18/2021   MG 1.8 12/18/2021     Hepatic Lab Results  Component Value Date   AST 31 12/18/2021   ALT  29 12/18/2021   ALBUMIN 3.6 12/18/2021   ALKPHOS 61 12/18/2021   LIPASE 31.0 01/23/2015     ID Lab Results  Component Value Date   SARSCOV2NAA NEGATIVE 05/23/2019     Bone Lab Results  Component Value Date   VD25OH 65.28 12/18/2021     Endocrine Lab Results  Component Value Date   GLUCOSE 108 (H) 12/18/2021   GLUCOSEU Negative 03/20/2021   HGBA1C 6.8 (H) 12/18/2021   TSH 2.250 03/20/2021   FREET4 1.0 03/01/2018   CRTSLPL 9.5 01/02/2020     Neuropathy Lab Results  Component Value Date   VITAMINB12 999 (H) 12/18/2021   HGBA1C 6.8 (H) 12/18/2021     CNS No results found for: "COLORCSF", "APPEARCSF", "RBCCOUNTCSF", "WBCCSF", "POLYSCSF", "LYMPHSCSF", "EOSCSF", "PROTEINCSF", "GLUCCSF", "JCVIRUS", "CSFOLI", "IGGCSF", "LABACHR", "ACETBL"   Inflammation (CRP: Acute  ESR: Chronic) Lab Results  Component Value Date   CRP <0.5 05/17/2012   ESRSEDRATE 37 (H) 05/17/2012     Rheumatology Lab Results  Component Value Date   RF <10 05/17/2012   ANA POS (A) 05/17/2012     Coagulation Lab Results  Component Value Date   PLT 237 03/20/2021     Cardiovascular Lab Results  Component Value Date   HGB 12.8 03/20/2021   HCT 38.2 03/20/2021     Screening Lab Results  Component Value Date   SARSCOV2NAA NEGATIVE 05/23/2019   COVIDSOURCE NASOPHARYNGEAL 12/23/2018     Cancer No results found for: "CEA", "CA125", "LABCA2"   Allergens No results found for: "ALMOND", "APPLE", "ASPARAGUS", "AVOCADO", "BANANA", "BARLEY", "BASIL", "BAYLEAF", "GREENBEAN", "LIMABEAN", "WHITEBEAN", "BEEFIGE", "REDBEET", "BLUEBERRY", "BROCCOLI", "CABBAGE", "MELON", "CARROT", "CASEIN", "CASHEWNUT", "CAULIFLOWER", "CELERY"     Note: Lab results reviewed.  PFSH  Drug: Ms. Kope  reports no history of drug use. Alcohol:  reports no history of alcohol use. Tobacco:  reports that she has been smoking cigarettes. She has a 15 pack-year smoking history. She has never used smokeless tobacco. Medical:   has a past medical history of Abdominal bloating (08/16/2013), Adjustment disorder (11/04/2010), Asthma, Atrial fibrillation with RVR (HCC), Autoimmune hepatitis (HCC), Bradycardia, Depression, Diabetes mellitus without complication (HCC), Dilated cardiomyopathy (HCC), DUB (dysfunctional uterine bleeding), Elevated liver enzymes (04/11/2020), Exposure to TB (2005), Fatigue (01/23/2015), Gallstones, Gastric ulcer, GERD (gastroesophageal reflux disease), HFrEF (heart failure with reduced ejection fraction) (HCC), History of asthma (02/26/2018), History of chicken pox, migraines, Leukopenia (04/11/2020), Liver disease, Motion sickness, Osteoarthritis, Systemic lupus erythematosus (HCC) (05/31/2010), Thoracic radiculitis (03/05/2012), and Vitamin D deficiency. Family: family history includes Alcohol abuse in her brother; Cancer in her mother; Diabetes in her sister, sister, and son; Heart disease in her brother and father; Hypertension in her father, sister, sister, and son; Kidney disease in her sister; Rheum arthritis in her  sister; Sarcoidosis in her sister.  Past Surgical History:  Procedure Laterality Date   ABDOMINAL HYSTERECTOMY     BACK SURGERY     L4/5 herniated disc repair 1992    HALLUX VALGUS AUSTIN Left 02/09/2019   Procedure: HALLUX VALGUS AUSTIN (MITCHELL);  Surgeon: Gwyneth Revels, DPM;  Location: [facility];  Service: Podiatry;  Laterality: Left;  General with local   HAMMER TOE SURGERY Left 02/09/2019   Procedure: HAMMER TOE CORRECTION;  Surgeon: Gwyneth Revels, DPM;  Location: [facility];  Service: Podiatry;  Laterality: Left;  Diabetic - oral meds   LAPAROSCOPIC HYSTERECTOMY     TUBAL LIGATION     Active Ambulatory Problems    Diagnosis Date Noted   Steroid-induced diabetes (HCC) 05/31/2010   Vitamin D deficiency 05/31/2010   Autoimmune hepatitis (HCC) 03/19/2010   Systemic lupus erythematosus (HCC) 05/31/2010   Hyperpigmentation 11/04/2010   Candidiasis  10/08/2011   Lumbosacral radiculopathy at L5 03/26/2012   Osteoarthritis of knee 03/26/2012   Fibromyalgia 08/16/2013   HLD (hyperlipidemia) 11/23/2013   Tobacco abuse 11/23/2013   Portal hypertensive gastropathy (HCC) 01/23/2015   Bronchitis 08/23/2017   Cirrhosis of liver (HCC) 02/26/2018   Sacroiliac joint pain 02/26/2018   Arthritis 02/26/2018   Chronic pain 02/26/2018   History of colon polyps 03/03/2018   Mild intermittent asthma with exacerbation 04/07/2018   Calculus of gallbladder without cholecystitis without obstruction 10/05/2018   Type 2 diabetes mellitus without complication, with long-term current use of insulin (HCC) 03/25/2019   Hirsutism 09/27/2019   Osteoarthritis of both shoulders 09/27/2019   Adrenal insufficiency due to corticosteroid withdrawal (HCC) 01/02/2020   Anxiety 03/27/2021   HFrEF (heart failure with reduced ejection fraction) (HCC) 11/13/2021   Myalgia due to statin 12/27/2021   Low serum vitamin B12 12/27/2021   Atrial fibrillation with RVR (HCC) 11/08/2021   Dilated cardiomyopathy (HCC) 12/30/2021   Bradycardia 12/08/2021   Acute pulmonary edema (HCC) 11/06/2021   Anemia 11/06/2021   Bilateral lower extremity edema 11/06/2021   Chronic cholecystitis 10/30/2022   Constipation 09/15/2022   Depression 08/24/2022   Diabetes mellitus due to underlying condition with hyperosmolarity without coma, without long-term current use of insulin (HCC) 04/23/2020   Diarrhea 11/06/2021   Disorder of pancreatic internal secretion 05/31/2010   Dyschromia 11/04/2010   Encephalopathy, portal systemic (HCC) 05/07/2010   Hypomagnesemia 11/06/2021   Pancreas cyst 03/02/2020   Pleural effusion 11/06/2021   Portal hypertension (HCC) 09/03/2021   Secondary esophageal varices without bleeding (HCC) 03/13/2022   Seronegative spondylitis (HCC) 03/10/2018   Symptomatic PVCs 12/08/2021   Thrombocytopenia, unspecified (HCC) 08/27/2022   Abdominal pain 11/06/2021    Elevated troponin 11/06/2021   Pharmacologic therapy 04/19/2023   Disorder of skeletal system 04/19/2023   Problems influencing health status 04/19/2023   Resolved Ambulatory Problems    Diagnosis Date Noted   Acute frontal sinusitis 07/03/2010   Adjustment disorder 11/04/2010   Polyarthralgia 11/23/2012   Cough 12/23/2012   Hemoptysis 12/23/2012   Abdominal bloating 08/16/2013   Chest pain, unspecified 11/23/2013   Fatigue 01/23/2015   History of asthma 02/26/2018   Verruca vulgaris 09/27/2019   Leukopenia 04/11/2020   Elevated liver enzymes 04/11/2020   Thoracic radiculitis 03/05/2012   Past Medical History:  Diagnosis Date   Asthma    Diabetes mellitus without complication (HCC)    DUB (dysfunctional uterine bleeding)    Exposure to TB 2005   Gallstones    Gastric ulcer    GERD (gastroesophageal reflux disease)  History of chicken pox    Hx of migraines    Liver disease    Motion sickness    Osteoarthritis    Constitutional Exam  General appearance: Well nourished, well developed, and well hydrated. In no apparent acute distress There were no vitals filed for this visit. BMI Assessment: Estimated body mass index is 27.97 kg/m as calculated from the following:   Height as of 05/08/22: 5\' 6"  (1.676 m).   Weight as of 05/08/22: 173 lb 4.5 oz (78.6 kg).  BMI interpretation table: BMI level Category Range association with higher incidence of chronic pain  <18 kg/m2 Underweight   18.5-24.9 kg/m2 Ideal body weight   25-29.9 kg/m2 Overweight Increased incidence by 20%  30-34.9 kg/m2 Obese (Class I) Increased incidence by 68%  35-39.9 kg/m2 Severe obesity (Class II) Increased incidence by 136%  >40 kg/m2 Extreme obesity (Class III) Increased incidence by 254%   Patient's current BMI Ideal Body weight  There is no height or weight on file to calculate BMI. Patient weight not recorded   BMI Readings from Last 4 Encounters:  05/08/22 27.97 kg/m  03/12/22 27.97  kg/m  12/18/21 28.37 kg/m  09/04/21 28.73 kg/m   Wt Readings from Last 4 Encounters:  05/08/22 173 lb 4.5 oz (78.6 kg)  03/12/22 173 lb 4.8 oz (78.6 kg)  12/18/21 175 lb 12.8 oz (79.7 kg)  09/04/21 178 lb (80.7 kg)    Psych/Mental status: Alert, oriented x 3 (person, place, & time)       Eyes: PERLA Respiratory: No evidence of acute respiratory distress  Assessment  Primary Diagnosis & Pertinent Problem List: The primary encounter diagnosis was Chronic pain syndrome. Diagnoses of Pharmacologic therapy, Disorder of skeletal system, and Problems influencing health status were also pertinent to this visit.  Visit Diagnosis (New problems to examiner): 1. Chronic pain syndrome   2. Pharmacologic therapy   3. Disorder of skeletal system   4. Problems influencing health status    Plan of Care (Initial workup plan)  Note: Ms. Edgett was reminded that as per protocol, today's visit has been an evaluation only. We have not taken over the patient's controlled substance management.  Problem-specific plan: Assessment and Plan            Lab Orders  No laboratory test(s) ordered today   Imaging Orders  No imaging studies ordered today   Referral Orders  No referral(s) requested today   Procedure Orders    No procedure(s) ordered today   Pharmacotherapy (current): Medications ordered:  No orders of the defined types were placed in this encounter.  Medications administered during this visit: Natha S. Hovatter had no medications administered during this visit.   Analgesic Pharmacotherapy:  Opioid Analgesics: For patients currently taking or requesting to take opioid analgesics, in accordance with [facility] Guidelines, we will assess their risks and indications for the use of these substances. After completing our evaluation, we may offer recommendations, but we no longer take patients for medication management. The prescribing physician will ultimately  decide, based on his/her training and level of comfort whether to adopt any of the recommendations, including whether or not to prescribe such medicines.  Membrane stabilizer: To be determined at a later time  Muscle relaxant: To be determined at a later time  NSAID: To be determined at a later time  Other analgesic(s): To be determined at a later time   Interventional management options: Ms. Yurchak was informed that there is no guarantee that  she would be a candidate for interventional therapies. The decision will be based on the results of diagnostic studies, as well as Ms. Laws's risk profile.  Procedure(s) under consideration:  Pending results of ordered studies      Interventional Therapies  Risk Factors  Considerations  Medical Comorbidities:     Planned  Pending:      Under consideration:   Pending   Completed:   None at this time   Therapeutic  Palliative (PRN) options:   None established   Completed by other providers:   None reported       Provider-requested follow-up: No follow-ups on file.  Future Appointments  Date Time Provider Department Center  04/20/2023  1:00 PM Delano Metz, MD [facility] None    Duration of encounter: *** minutes.  Total time on encounter, as per AMA guidelines included both the face-to-face and non-face-to-face time personally spent by the physician and/or other qualified health care professional(s) on the day of the encounter (includes time in activities that require the physician or other qualified health care professional and does not include time in activities normally performed by clinical staff). Physician's time may include the following activities when performed: Preparing to see the patient (e.g., pre-charting review of records, searching for previously ordered imaging, lab work, and nerve conduction tests) Review of prior analgesic pharmacotherapies. Reviewing PMP Interpreting ordered tests (e.g., lab work,  imaging, nerve conduction tests) Performing post-procedure evaluations, including interpretation of diagnostic procedures Obtaining and/or reviewing separately obtained history Performing a medically appropriate examination and/or evaluation Counseling and educating the patient/family/caregiver Ordering medications, tests, or procedures Referring and communicating with other health care professionals (when not separately reported) Documenting clinical information in the electronic or other health record Independently interpreting results (not separately reported) and communicating results to the patient/ family/caregiver Care coordination (not separately reported)  Note by: Oswaldo Done, MD (AI and TTS technology used. I apologize for any typographical errors that were not detected and corrected.) Date: 04/20/2023; Time: 6:44 PM

## 2023-04-19 DIAGNOSIS — Z79899 Other long term (current) drug therapy: Secondary | ICD-10-CM | POA: Insufficient documentation

## 2023-04-19 DIAGNOSIS — M899 Disorder of bone, unspecified: Secondary | ICD-10-CM | POA: Insufficient documentation

## 2023-04-19 DIAGNOSIS — Z789 Other specified health status: Secondary | ICD-10-CM | POA: Insufficient documentation

## 2023-04-20 ENCOUNTER — Ambulatory Visit: Payer: Medicare HMO | Admitting: Pain Medicine

## 2023-04-20 ENCOUNTER — Ambulatory Visit
Admission: RE | Admit: 2023-04-20 | Discharge: 2023-04-20 | Disposition: A | Discharge status: Home / Self Care | Payer: Medicare HMO | Source: Ambulatory Visit | Attending: Pain Medicine | Admitting: Pain Medicine

## 2023-04-20 ENCOUNTER — Encounter: Payer: Self-pay | Admitting: Pain Medicine

## 2023-04-20 VITALS — BP 130/71 | HR 86 | Temp 97.4°F | Ht 65.0 in | Wt 173.0 lb

## 2023-04-20 DIAGNOSIS — G8929 Other chronic pain: Secondary | ICD-10-CM | POA: Insufficient documentation

## 2023-04-20 DIAGNOSIS — M79604 Pain in right leg: Secondary | ICD-10-CM

## 2023-04-20 DIAGNOSIS — M5442 Lumbago with sciatica, left side: Secondary | ICD-10-CM | POA: Insufficient documentation

## 2023-04-20 DIAGNOSIS — M79605 Pain in left leg: Secondary | ICD-10-CM | POA: Insufficient documentation

## 2023-04-20 DIAGNOSIS — Z7901 Long term (current) use of anticoagulants: Secondary | ICD-10-CM

## 2023-04-20 DIAGNOSIS — Z789 Other specified health status: Secondary | ICD-10-CM | POA: Insufficient documentation

## 2023-04-20 DIAGNOSIS — M533 Sacrococcygeal disorders, not elsewhere classified: Secondary | ICD-10-CM

## 2023-04-20 DIAGNOSIS — Z79899 Other long term (current) drug therapy: Secondary | ICD-10-CM

## 2023-04-20 DIAGNOSIS — M4690 Unspecified inflammatory spondylopathy, site unspecified: Secondary | ICD-10-CM | POA: Insufficient documentation

## 2023-04-20 DIAGNOSIS — M5441 Lumbago with sciatica, right side: Secondary | ICD-10-CM

## 2023-04-20 DIAGNOSIS — R937 Abnormal findings on diagnostic imaging of other parts of musculoskeletal system: Secondary | ICD-10-CM | POA: Diagnosis present

## 2023-04-20 DIAGNOSIS — M899 Disorder of bone, unspecified: Secondary | ICD-10-CM | POA: Insufficient documentation

## 2023-04-20 DIAGNOSIS — M4316 Spondylolisthesis, lumbar region: Secondary | ICD-10-CM

## 2023-04-20 DIAGNOSIS — T380X5S Adverse effect of glucocorticoids and synthetic analogues, sequela: Secondary | ICD-10-CM | POA: Insufficient documentation

## 2023-04-20 DIAGNOSIS — E559 Vitamin D deficiency, unspecified: Secondary | ICD-10-CM

## 2023-04-20 DIAGNOSIS — E099 Drug or chemical induced diabetes mellitus without complications: Secondary | ICD-10-CM | POA: Insufficient documentation

## 2023-04-20 DIAGNOSIS — G894 Chronic pain syndrome: Secondary | ICD-10-CM | POA: Diagnosis not present

## 2023-04-20 DIAGNOSIS — Z7952 Long term (current) use of systemic steroids: Secondary | ICD-10-CM

## 2023-04-20 NOTE — Patient Instructions (Signed)
______________________________________________________________________    New Patients  Welcome to [facility].   Initial Visit The first or initial visit consists of an evaluation only.   Interventional pain management.  We offer therapies other than opioid controlled substances to manage chronic pain. These include, but are not limited to, diagnostic, therapeutic, and palliative specialized injection therapies (i.e.: Epidural Steroids, Facet Blocks, etc.). We specialize in a variety of nerve blocks as well as radiofrequency treatments. We offer pain implant evaluations and trials, as well as follow up management. In addition we also provide a variety joint injections, including Viscosupplementation (AKA: Gel Therapy).  Prescription Pain Medication. We specialize in alternatives to opioids. We can provide evaluations and recommendations for/of pharmacologic therapies based on CDC Guidelines.  We no longer take patients for long-term medication management. We will not be taking over your pain medications.  ______________________________________________________________________      ______________________________________________________________________    Patient Information update  To: All of our patients.  Re: Name change.  It has been made official that our current name, "[facility]"   will soon be changed to " [facility]".   The purpose of this change is to eliminate any confusion created by the concept of our practice being a "Medication Management Pain Clinic". In the past this has led to the misconception that we treat pain primarily by the use of prescription medications.  Nothing can be farther from the truth.   Understanding PAIN MANAGEMENT: To further understand what our practice does, you first have to  understand that "Pain Management" is a subspecialty that requires additional training once a physician has completed their specialty training, which can be in either Anesthesia, Neurology, Psychiatry, or Physical Medicine and Rehabilitation (PMR). Each one of these contributes to the final approach taken by each physician to the management of their patient's pain. To be a "Pain Management Specialist" you must have first completed one of the specialty trainings below.  Anesthesiologists - trained in clinical pharmacology and interventional techniques such as nerve blockade and regional as well as central neuroanatomy. They are trained to block pain before, during, and after surgical interventions.  Neurologists - trained in the diagnosis and pharmacological treatment of complex neurological conditions, such as Multiple Sclerosis, Parkinson's, spinal cord injuries, and other systemic conditions that may be associated with symptoms that may include but are not limited to pain. They tend to rely primarily on the treatment of chronic pain using prescription medications.  Psychiatrist - trained in conditions affecting the psychosocial wellbeing of patients including but not limited to depression, anxiety, schizophrenia, personality disorders, addiction, and other substance use disorders that may be associated with chronic pain. They tend to rely primarily on the treatment of chronic pain using prescription medications.   Physical Medicine and Rehabilitation (PMR) physicians, also known as physiatrists - trained to treat a wide variety of medical conditions affecting the brain, spinal cord, nerves, bones, joints, ligaments, muscles, and tendons. Their training is primarily aimed at treating patients that have suffered injuries that have caused severe physical impairment. Their training is primarily aimed at the physical therapy and rehabilitation of those patients. They may also work alongside orthopedic surgeons  or neurosurgeons using their expertise in assisting surgical patients to recover after their surgeries.  INTERVENTIONAL PAIN MANAGEMENT is sub-subspecialty of Pain Management.  Our physicians are Board-certified in Anesthesia, Pain Management, and Interventional Pain Management.  This meaning that  not only have they been trained and Board-certified in their specialty of Anesthesia, and subspecialty of Pain Management, but they have also received further training in the sub-subspecialty of Interventional Pain Management, in order to become Board-certified as INTERVENTIONAL PAIN MANAGEMENT SPECIALIST.    Mission: Our goal is to use our skills in  INTERVENTIONAL PAIN MANAGEMENT as alternatives to the chronic use of prescription opioid medications for the treatment of pain. To make this more clear, we have changed our name to reflect what we do and offer. We will continue to offer medication management assessment and recommendations, but we will not be taking over any patient's medication management.  ______________________________________________________________________       ______________________________________________________________________    Blood Thinners  IMPORTANT NOTICE:  If you take any of these, make sure to notify the nursing staff.  Failure to do so may result in serious injury.  Recommended time intervals to stop and restart blood-thinners, before & after invasive procedures  Generic Name Brand Name Pre-procedure: Stop medication for this amount of time before your procedure: Post-procedure: Wait this amount of time after the procedure before restarting your medication:  Abciximab Reopro 15 days 2 hrs  Alteplase Activase 10 days 10 days  Anagrelide Agrylin    Apixaban Eliquis 3 days 6 hrs  Cilostazol Pletal 3 days 5 hrs  Clopidogrel Plavix 7-10 days 2 hrs  Dabigatran Pradaxa 5 days 6 hrs  Dalteparin Fragmin 24 hours 4 hrs  Dipyridamole Aggrenox 11days 2 hrs  Edoxaban  Lixiana; Savaysa 3 days 2 hrs  Enoxaparin  Lovenox 24 hours 4 hrs  Eptifibatide Integrillin 8 hours 2 hrs  Fondaparinux  Arixtra 72 hours 12 hrs  Hydroxychloroquine Plaquenil 11 days   Prasugrel Effient 7-10 days 6 hrs  Reteplase Retavase 10 days 10 days  Rivaroxaban Xarelto 3 days 6 hrs  Ticagrelor Brilinta 5-7 days 6 hrs  Ticlopidine Ticlid 10-14 days 2 hrs  Tinzaparin Innohep 24 hours 4 hrs  Tirofiban Aggrastat 8 hours 2 hrs  Warfarin Coumadin 5 days 2 hrs   Other medications with blood-thinning effects  Product indications Generic (Brand) names Note  Cholesterol Lipitor Stop 4 days before procedure  Blood thinner (injectable) Heparin (LMW or LMWH Heparin) Stop 24 hours before procedure  Cancer Ibrutinib (Imbruvica) Stop 7 days before procedure  Malaria/Rheumatoid Hydroxychloroquine (Plaquenil) Stop 11 days before procedure  Thrombolytics  10 days before or after procedures   Over-the-counter (OTC) Products with blood-thinning effects  Product Common names Stop Time  Aspirin > 325 mg Goody Powders, Excedrin, etc. 11 days  Aspirin <= 81 mg  7 days  Fish oil  4 days  Garlic supplements  7 days  Ginkgo biloba  36 hours  Ginseng  24 hours  NSAIDs Ibuprofen, Naprosyn, etc. 3 days  Vitamin E  4 days   ______________________________________________________________________

## 2023-04-23 LAB — COMPLIANCE DRUG ANALYSIS, UR

## 2023-04-28 LAB — COMP. METABOLIC PANEL (12)
AST: 29 [IU]/L (ref 0–40)
Albumin: 3.9 g/dL (ref 3.9–4.9)
Alkaline Phosphatase: 131 [IU]/L — ABNORMAL HIGH (ref 44–121)
BUN/Creatinine Ratio: 15 (ref 12–28)
BUN: 13 mg/dL (ref 8–27)
Bilirubin Total: 0.3 mg/dL (ref 0.0–1.2)
Calcium: 9.2 mg/dL (ref 8.7–10.3)
Chloride: 106 mmol/L (ref 96–106)
Creatinine, Ser: 0.88 mg/dL (ref 0.57–1.00)
Globulin, Total: 3.1 g/dL (ref 1.5–4.5)
Glucose: 162 mg/dL — ABNORMAL HIGH (ref 70–99)
Potassium: 4.7 mmol/L (ref 3.5–5.2)
Sodium: 144 mmol/L (ref 134–144)
Total Protein: 7 g/dL (ref 6.0–8.5)
eGFR: 73 mL/min/{1.73_m2} (ref >59)

## 2023-04-28 LAB — SEDIMENTATION RATE: Sed Rate: 58 mm/h — ABNORMAL HIGH (ref 0–40)

## 2023-04-28 LAB — VITAMIN B12: Vitamin B-12: 971 pg/mL (ref 232–1245)

## 2023-04-28 LAB — 25-HYDROXY VITAMIN D LCMS D2+D3
25-Hydroxy, Vitamin D-2: 1.1 ng/mL
25-Hydroxy, Vitamin D-3: 39 ng/mL
25-Hydroxy, Vitamin D: 40 ng/mL

## 2023-04-28 LAB — MAGNESIUM: Magnesium: 1.8 mg/dL (ref 1.6–2.3)

## 2023-04-28 LAB — C-REACTIVE PROTEIN: CRP: <1 mg/L (ref 0–10)

## 2023-05-10 NOTE — Progress Notes (Unsigned)
PROVIDER NOTE: Information contained herein reflects review and annotations entered in association with encounter. Interpretation of such information and data should be left to medically-trained personnel. Information provided to patient can be located elsewhere in the medical record under "Patient Instructions". Document created using STT-dictation technology, any transcriptional errors that may result from process are unintentional.    Patient: Shannon [name]  Service Category: E/M  Provider: Oswaldo Done, MD  DOB: 1956-04-06  DOS: 05/11/2023  Referring Provider: Vernard Gambles, MD  MRN: [id number]  Specialty: Interventional Pain Management  PCP: Vernard Gambles, MD  Type: Established Patient  Setting: Ambulatory outpatient    Location: Office  Delivery: Face-to-face     Primary Reason(s) for Visit: Encounter for evaluation before starting new chronic pain management plan of care (Level of risk: moderate) CC: Back Pain  HPI  [name] is a 67 y.o. year old, female patient, who comes today for a follow-up evaluation to review the test results and decide on a treatment plan. She has Steroid-induced diabetes (HCC); Vitamin D deficiency; Autoimmune hepatitis (HCC); Systemic lupus erythematosus (HCC); Hyperpigmentation; Candidiasis; Lumbosacral radiculopathy at L5; Osteoarthritis of knee; HLD (hyperlipidemia); Tobacco abuse; Portal hypertensive gastropathy (HCC); Bronchitis; Cirrhosis of liver (HCC); Sacroiliac joint pain; Arthritis; Chronic pain syndrome; History of colon polyps; Mild intermittent asthma with exacerbation; Calculus of gallbladder without cholecystitis without obstruction; Type 2 diabetes mellitus without complication, with long-term current use of insulin (HCC); Hirsutism; Osteoarthritis of both shoulders; Adrenal insufficiency due to corticosteroid withdrawal (HCC); Anxiety; HFrEF (heart failure with reduced ejection fraction) (HCC); Myalgia due to statin; Low serum vitamin B12;  Atrial fibrillation with RVR (HCC); Dilated cardiomyopathy (HCC); Bradycardia; Acute pulmonary edema (HCC); Anemia; Chronic cholecystitis; Constipation; Depression; Diabetes mellitus due to underlying condition with hyperosmolarity without coma, without long-term current use of insulin (HCC); Diarrhea; Disorder of pancreatic internal secretion; Dyschromia; Encephalopathy, portal systemic (HCC); Hypomagnesemia; Pancreas cyst; Pleural effusion; Portal hypertension (HCC); Secondary esophageal varices without bleeding (HCC); Seronegative spondylitis (HCC); Symptomatic PVCs; Thrombocytopenia, unspecified (HCC); Abdominal pain; Elevated troponin; Pharmacologic therapy; Disorder of skeletal system; Problems influencing health status; Chronic anticoagulation (Eliquis); Abnormal MRI, lumbar spine (12/16/2022) [facility]); Lumbar Grade 1 Anterolisthesis of L3/L4 & L4/L5; Chronic lower extremity pain (2ry area of Pain) (Bilateral) (R>L); Chronic low back pain (1ry area of Pain) (Bilateral) w/ sciatica (Bilateral); Long term (current) use of systemic steroids; Lower extremity weakness (Left); and Frequent falls on their problem list. Her primarily concern today is the Back Pain  Pain Assessment: Location: Lower, Right, Left Back Radiating: radaites from lower back into left leg down to ankle of left leg Onset: More than a month ago Duration: Chronic pain Quality: Constant, Aching, Throbbing, Shooting, Sharp, Stabbing, Discomfort, Radiating Severity: 7 /10 (subjective, self-reported pain score)  Effect on ADL: Limits ADLs Timing: Constant Modifying factors: Denies BP: 110/76  HR: 70  [name] comes in today for a follow-up visit after her initial evaluation on 04/20/2023. Today we went over the results of her tests. These were explained in "Layman's terms". During today's appointment we went over my diagnostic impression, as well as the proposed treatment plan.  Review of initial evaluation (04/20/2023): "The  patient, referred for multiple injections in their back, presents with a history of chronic lower back pain, predominantly on the left side. The pain, initially localized, has now spread across the lower back. The patient also reports pain radiating down the left leg, attributed to nerve damage from a previous hernia. This leg pain, however, is not the  primary concern and is considered secondary to the back pain.   The patient underwent back surgery around 1992 or 1999, involving the L4 and L5 vertebrae. Post-surgery, the patient experienced persistent pain, with occasional flare-ups triggered by heavy lifting or sudden movements.   An MRI revealed grade 1 anterolisthesis of L3 over L4 and L4 over L5, multilevel degenerative disc disease, bulging discs at several levels, ligamentous flavum thickening, and facet joint hypertrophy at multiple levels. The patient has received three injections on the left side at the [facility] for pain management.   The patient has a complex medical history, including atrial fibrillation managed with Eliquis, a pacemaker-defibrillator implant due to heart failure, autoimmune hepatitis leading to cirrhosis of the liver, and lupus. The patient also has a history of osteoarthritis of the knee and shoulder pain, both treated with injections. The patient is on a daily regimen of steroids, initially prescribed for liver failure, but continued due to perceived benefits in overall strength and well-being.   The patient denies any current issues with shortness of breath, swelling of the legs, or internal bleeding. The primary goal expressed by the patient is to alleviate the persistent back pain."  Review of diagnostic tests ordered on 04/20/2023: Lab work was within normal limits with the exception of the comprehensive metabolic panel which was showing an elevated alkaline phosphatase level as well as an elevated glucose level however the test was not done NPO.  In addition sed  rate was shown to be elevated at 58 mm/h (normal 0-40).  Diagnostic x-rays of the lumbar spine with flexion and extension views demonstrated a grade 1 anterolisthesis of L4 over L5 and L3 over L4.  In addition he demonstrated degenerative disc disease with loss of disc height at L3-4, L4-5, and L5-S1 with bilateral lumbar facet degenerative changes most prominent at L2-3-4 and L4-5.  Diagnostic x-rays of the sacroiliac joints showed left greater than right sacroiliac joint degenerative/arthropathic changes.  Discussed the use of AI scribe software for clinical note transcription with the patient, who gave verbal consent to proceed.  History of Present Illness   The patient, with a history of chronic low back pain, reports that the pain is bilateral but more severe on the left side. The pain radiates down the left leg. The patient believes this pain is secondary to a previous disc herniation and has worsened over the years. She has undergone back surgeries involving multiple levels and has received injections at the [facility].  The patient reports left-sided weakness, favoring the right side. She describes instability when initiating movement with the left leg, often leading to a loss of balance and a need to hold onto walls or railings for support. The patient has not experienced a full fall but has had near falls due to this instability.  The patient has a history of sacroiliac joint injections on the left side. She reports that the numbing medicine provided immediate relief, but the effect was short-lived, lasting only a couple of weeks. The patient also reports discomfort in the left lower back during certain movements and positions, suggesting possible facet joint or sacroiliac joint issues.  The patient is currently on Eliquis, which she has previously stopped for three days prior to procedures. She has received approval from the prescribing physician to do so.      Patient presented with  interventional treatment options. Ms. Cristofaro was informed that I will not be providing medication management. Pharmacotherapy evaluation including recommendations may be offered,  if specifically requested.   Controlled Substance Pharmacotherapy Assessment REMS (Risk Evaluation and Mitigation Strategy)  Opioid Analgesic:  None MME/day: 0 mg/day  Pill Count: None expected due to no prior prescriptions written by our practice. Earlyne Iba, RN  05/11/2023  1:04 PM  Sign when Signing Visit Safety precautions to be maintained throughout the outpatient stay will include: orient to surroundings, keep bed in low position, maintain call bell within reach at all times, provide assistance with transfer out of bed and ambulation.    Pharmacokinetics: Liberation and absorption (onset of action): WNL Distribution (time to peak effect): WNL Metabolism and excretion (duration of action): WNL         Pharmacodynamics: Desired effects: Analgesia: Ms. Hofmeyer reports >50% benefit. Functional ability: Patient reports that medication allows her to accomplish basic ADLs Clinically meaningful improvement in function (CMIF): Sustained CMIF goals met Perceived effectiveness: Described as relatively effective, allowing for increase in activities of daily living (ADL) Undesirable effects: Side-effects or Adverse reactions: None reported Monitoring: [place] PMP: PDMP reviewed during this encounter. Online review of the past 38-month period previously conducted. Not applicable at this point since we have not taken over the patient's medication management yet. List of other Serum/Urine Drug Screening Test(s):  No results found for: "AMPHSCRSER", "BARBSCRSER", "BENZOSCRSER", "COCAINSCRSER", "COCAINSCRNUR", "PCPSCRSER", "THCSCRSER", "THCU", "CANNABQUANT", "OPIATESCRSER", "OXYSCRSER", "PROPOXSCRSER", "ETH", "CBDTHCR", "D8THCCBX", "D9THCCBX" List of all UDS test(s) done:  Lab Results  Component Value Date   SUMMARY FINAL  04/20/2023   Last UDS on record: Summary  Date Value Ref Range Status  04/20/2023 FINAL  Final    Comment:    ==================================================================== Compliance Drug Analysis, Ur ==================================================================== Test                             Result       Flag       Units  Drug Present and Declared for Prescription Verification   Acetaminophen                  PRESENT      EXPECTED  Drug Present not Declared for Prescription Verification   Dextromethorphan               PRESENT      UNEXPECTED   Dextrorphan/Levorphanol        PRESENT      UNEXPECTED    Dextrorphan is an expected metabolite of dextromethorphan, an over-    the-counter or prescription cough suppressant. Dextrorphan cannot be    distinguished from the scheduled prescription medication levorphanol    by the method used for analysis.  Drug Absent but Declared for Prescription Verification   Tramadol                       Not Detected UNEXPECTED ng/mg creat   Trazodone                      Not Detected UNEXPECTED   Lidocaine                      Not Detected UNEXPECTED    Lidocaine, as indicated in the declared medication list, is not    always detected even when used as directed.    Metoprolol                     Not Detected UNEXPECTED ==================================================================== Test  Result    Flag   Units      Ref Range   Creatinine              150              mg/dL      >=40 ==================================================================== Declared Medications:  The flagging and interpretation on this report are based on the  following declared medications.  Unexpected results may arise from  inaccuracies in the declared medications.   **Note: The testing scope of this panel includes these medications:   Metoprolol  Tramadol (Ultram)  Trazodone (Desyrel)   **Note: The testing scope of this  panel does not include small to  moderate amounts of these reported medications:   Acetaminophen (Tylenol)  Topical Lidocaine (Lidoderm)   **Note: The testing scope of this panel does not include the  following reported medications:   Albuterol (Ventolin HFA)  Apixaban (Eliquis)  Azathioprine (Imuran)  Celecoxib (Celebrex)  Empagliflozin (Jardiance)  Furosemide (Lasix)  Insulin Evaristo Bury)  Lactulose  Loratadine (Claritin)  Losartan (Cozaar)  Magnesium (Mag-Ox)  Metformin  Miconazole  Nystatin (Mycostatin)  Omeprazole (Prilosec)  Prednisone (Deltasone)  Simethicone  Spironolactone (Aldactone)  Vitamin B12  Vitamin D3 ==================================================================== For clinical consultation, please call [phone]. ====================================================================    UDS interpretation: No unexpected findings.          Medication Assessment Form: Not applicable. No opioids. Treatment compliance: Not applicable Risk Assessment Profile: Aberrant behavior: See initial evaluations. None observed or detected today Comorbid factors increasing risk of overdose: See initial evaluation. No additional risks detected today Opioid risk tool (ORT):     04/20/2023    1:22 PM  Opioid Risk   Alcohol 0  Illegal Drugs 0  Rx Drugs 0  Alcohol 0  Illegal Drugs 0  Rx Drugs 0  Age between 16-45 years  0  History of Preadolescent Sexual Abuse 0  Psychological Disease 0  Depression 1  Opioid Risk Tool Scoring 1  Opioid Risk Interpretation Low Risk    ORT Scoring interpretation table:  Score <3 = Low Risk for SUD  Score between 4-7 = Moderate Risk for SUD  Score >8 = High Risk for Opioid Abuse   Risk of substance use disorder (SUD): Low  Risk Mitigation Strategies:  Patient opioid safety counseling: No controlled substances prescribed. Patient-Prescriber Agreement (PPA): No agreement signed.  Controlled substance notification to other  providers: None required. No opioid therapy.  Pharmacologic Plan: Non-opioid analgesic therapy offered. Interventional alternatives discussed.             Laboratory Chemistry Profile   Renal Lab Results  Component Value Date   BUN 13 04/20/2023   CREATININE 0.88 04/20/2023   BCR 15 04/20/2023   GFR 90.32 12/18/2021   SPECGRAV 1.018 03/20/2021   PHUR 6.5 03/20/2021   PROTEINUR Negative 03/20/2021     Electrolytes Lab Results  Component Value Date   NA 144 04/20/2023   K 4.7 04/20/2023   CL 106 04/20/2023   CALCIUM 9.2 04/20/2023   MG 1.8 04/20/2023     Hepatic Lab Results  Component Value Date   AST 29 04/20/2023   ALT 29 12/18/2021   ALBUMIN 3.9 04/20/2023   ALKPHOS 131 (H) 04/20/2023   LIPASE 31.0 01/23/2015     ID Lab Results  Component Value Date   SARSCOV2NAA NEGATIVE 05/23/2019     Bone Lab Results  Component Value Date   VD25OH 65.28 12/18/2021   25OHVITD1 40 04/20/2023  25OHVITD2 1.1 04/20/2023   25OHVITD3 39 04/20/2023     Endocrine Lab Results  Component Value Date   GLUCOSE 162 (H) 04/20/2023   GLUCOSEU Negative 03/20/2021   HGBA1C 6.8 (H) 12/18/2021   TSH 2.250 03/20/2021   FREET4 1.0 03/01/2018   CRTSLPL 9.5 01/02/2020     Neuropathy Lab Results  Component Value Date   VITAMINB12 971 04/20/2023   HGBA1C 6.8 (H) 12/18/2021     CNS No results found for: "COLORCSF", "APPEARCSF", "RBCCOUNTCSF", "WBCCSF", "POLYSCSF", "LYMPHSCSF", "EOSCSF", "PROTEINCSF", "GLUCCSF", "JCVIRUS", "CSFOLI", "IGGCSF", "LABACHR", "ACETBL"   Inflammation (CRP: Acute  ESR: Chronic) Lab Results  Component Value Date   CRP <1 04/20/2023   ESRSEDRATE 58 (H) 04/20/2023     Rheumatology Lab Results  Component Value Date   RF <10 05/17/2012   ANA POS (A) 05/17/2012     Coagulation Lab Results  Component Value Date   PLT 237 03/20/2021     Cardiovascular Lab Results  Component Value Date   HGB 12.8 03/20/2021   HCT 38.2 03/20/2021      Screening Lab Results  Component Value Date   SARSCOV2NAA NEGATIVE 05/23/2019   COVIDSOURCE NASOPHARYNGEAL 12/23/2018     Cancer No results found for: "CEA", "CA125", "LABCA2"   Allergens No results found for: "ALMOND", "APPLE", "ASPARAGUS", "AVOCADO", "BANANA", "BARLEY", "BASIL", "BAYLEAF", "GREENBEAN", "LIMABEAN", "WHITEBEAN", "BEEFIGE", "REDBEET", "BLUEBERRY", "BROCCOLI", "CABBAGE", "MELON", "CARROT", "CASEIN", "CASHEWNUT", "CAULIFLOWER", "CELERY"     Note: Lab results reviewed.  Recent Diagnostic Imaging Review  Lumbosacral Imaging: Lumbar DG Bending views: Results for orders placed during the hospital encounter of 04/20/23 DG Lumbar Spine Complete W/Bend  Narrative CLINICAL DATA:  Chronic low back pain  EXAM: LUMBAR SPINE - COMPLETE WITH BENDING VIEWS  COMPARISON:  None Available.  FINDINGS: No fracture or bone lesion. Minor, grade 1, anterolistheses of L4 on L5 and L3 and L4. No other spondylolisthesis.  Mild loss of disc height at L3-L4, L4-L5 and L5-S1. Bilateral facet degenerative changes noted at L3-L4 and L4-L5  Double pigtail stent in the right upper quadrant presumed to be a biliary stent. Scattered aortic and branch vessel atherosclerotic calcifications.  IMPRESSION: 1. No fracture or acute finding. 2. Degenerative changes as detailed.   Electronically Signed By: Amie Portland M.D. On: 04/29/2023 10:17  Sacroiliac Joint Imaging: Sacroiliac Joint DG: Results for orders placed during the hospital encounter of 04/20/23 DG Si Joints  Narrative CLINICAL DATA:  Bilateral SI joint region pain.  EXAM: BILATERAL SACROILIAC JOINTS - 3+ VIEW  COMPARISON:  None Available.  FINDINGS: No fracture or bone lesion.  Sclerosis noted along the iliac side of the right SI joint and the sacral and iliac side of the left SI joint. Mild narrowing of the inferior aspect of the left SI joint. No erosions  Sclerosis also noted along the margins of the pubic  symphysis.  Soft tissues are unremarkable.  IMPRESSION: 1. No fracture or acute finding. 2. Left greater than right SI joint degenerative/arthropathic changes. No erosions.   Electronically Signed By: Amie Portland M.D. On: 04/29/2023 10:18  Complexity Note: Imaging results reviewed.                         Meds   Current Outpatient Medications:    acetaminophen (TYLENOL) 650 MG CR tablet, Take by mouth., Disp: , Rfl:    albuterol (VENTOLIN HFA) 108 (90 Base) MCG/ACT inhaler, USE 1 TO 2 INHALATIONS BY  MOUTH INTO THE LUNGS EVERY  6  HOURS AS NEEDED FOR  WHEEZING OR SHORTNESS OF  BREATH, Disp: 34 g, Rfl: 2   apixaban (ELIQUIS) 5 MG TABS tablet, Take 1 tablet (5 mg total) by mouth 2 (two) times daily., Disp: , Rfl: 0   azaTHIOprine (IMURAN) 50 MG tablet, Take 4 tablets (200 mg total) by mouth daily., Disp: , Rfl:    blood glucose meter kit and supplies KIT, Dispense based on patient and insurance preference. Use up to four times daily as directed. (FOR ICD-9 250.00, 250.01)., Disp: 1 each, Rfl: 0   blood glucose meter kit and supplies, Dispense based on patient and insurance preference. Use up to four times daily as directed. (FOR ICD-10 E10.9, E11.9)., Disp: 1 each, Rfl: 0   Blood Glucose Monitoring Suppl (ONE TOUCH ULTRA SYSTEM KIT) W/DEVICE KIT, Test blood sugar as directed Dx E09.9, Disp: 1 each, Rfl: 0   celecoxib (CELEBREX) 100 MG capsule, Take 100 mg by mouth 2 (two) times a week., Disp: , Rfl:    Cholecalciferol (VITAMIN D3) 5000 units TABS, Take by mouth. , Disp: , Rfl:    furosemide (LASIX) 40 MG tablet, Take 1 tablet (40 mg total) by mouth daily., Disp: , Rfl: 0   glucose blood (ONETOUCH VERIO) test strip, 1 each by Other route 4 (four) times daily. Use as instructed, Disp: 400 strip, Rfl: 3   insulin degludec (TRESIBA FLEXTOUCH) 100 UNIT/ML FlexTouch Pen, Inject 10 Units into the skin daily., Disp: 15 mL, Rfl: 11   Insulin Pen Needle (PEN NEEDLES) 31G X 6 MM MISC, Use with  [place] as instructed daily, Disp: 100 each, Rfl: 11   lactulose (CHRONULAC) 10 GM/15ML solution, TAKE 30 ML BY MOUTH 4 TIMES DAILY AS NEEDED (Patient taking differently: Take 30 g by mouth. 50-60 g 1x per day not 30 g qid), Disp: 10800 mL, Rfl: 3   lidocaine (LIDODERM) 5 %, Place 1 patch onto the skin daily. Remove & Discard patch within 12 hours or as directed by MD, Disp: 30 patch, Rfl: 5   loratadine (CLARITIN) 10 MG tablet, Take 10 mg by mouth every other day as needed for allergies., Disp: , Rfl:    losartan (COZAAR) 25 MG tablet, Take 1 tablet by mouth daily., Disp: , Rfl:    Magnesium Oxide 500 MG TABS, Take by mouth., Disp: , Rfl:    metFORMIN (GLUCOPHAGE-XR) 500 MG 24 hr tablet, TAKE 2 TABLETS BY MOUTH TWICE  DAILY WITH A MEAL, Disp: 400 tablet, Rfl: 2   metoprolol tartrate (LOPRESSOR) 50 MG tablet, Take 1 tablet (50 mg total) by mouth 2 (two) times daily., Disp: , Rfl: 0   miconazole (MICONAZOLE 3) 200 MG vaginal suppository, Place 1 suppository (200 mg total) vaginally at bedtime., Disp: 3 suppository, Rfl: 0   nystatin (MYCOSTATIN) 100000 UNIT/ML suspension, Take 5 mLs (500,000 Units total) by mouth 4 (four) times daily. Hold and spit, Disp: 473 mL, Rfl: 0   omeprazole (PRILOSEC) 40 MG capsule, TAKE 1 CAPSULE BY MOUTH  DAILY 1/2 HOUR BEFORE A  MEAL, Disp: 90 capsule, Rfl: 3   predniSONE (DELTASONE) 1 MG tablet, Take 3 mg by mouth daily., Disp: , Rfl:    Simethicone 180 MG CAPS, Take by mouth., Disp: , Rfl:    spironolactone (ALDACTONE) 25 MG tablet, Take 1 tablet (25 mg total) by mouth daily., Disp: , Rfl: 0   traMADol (ULTRAM) 50 MG tablet, Take by mouth., Disp: , Rfl:    traZODone (DESYREL) 50 MG tablet, Take 25 mg by  mouth at bedtime as needed., Disp: , Rfl:    vitamin B-12 (CYANOCOBALAMIN) 100 MCG tablet, Take 100 mcg by mouth daily., Disp: , Rfl:   ROS  Constitutional: Denies any fever or chills Gastrointestinal: No reported hemesis, hematochezia, vomiting, or acute GI  distress Musculoskeletal: Denies any acute onset joint swelling, redness, loss of ROM, or weakness Neurological: No reported episodes of acute onset apraxia, aphasia, dysarthria, agnosia, amnesia, paralysis, loss of coordination, or loss of consciousness  Allergies  Ms. Craige is allergic to hydrocodone, lipitor [atorvastatin calcium], milk-related compounds, oxycodone, wellbutrin [bupropion], and adhesive [tape].  PFSH  Drug: Ms. Allread  reports no history of drug use. Alcohol:  reports no history of alcohol use. Tobacco:  reports that she has been smoking cigarettes. She has a 15 pack-year smoking history. She has never used smokeless tobacco. Medical:  has a past medical history of Abdominal bloating (08/16/2013), Adjustment disorder (11/04/2010), Asthma, Atrial fibrillation with RVR (HCC), Autoimmune hepatitis (HCC), Bradycardia, Depression, Diabetes mellitus without complication (HCC), Dilated cardiomyopathy (HCC), DUB (dysfunctional uterine bleeding), Elevated liver enzymes (04/11/2020), Exposure to TB (2005), Fatigue (01/23/2015), Gallstones, Gastric ulcer, GERD (gastroesophageal reflux disease), HFrEF (heart failure with reduced ejection fraction) (HCC), History of asthma (02/26/2018), History of chicken pox, migraines, Leukopenia (04/11/2020), Liver disease, Motion sickness, Osteoarthritis, Systemic lupus erythematosus (HCC) (05/31/2010), Thoracic radiculitis (03/05/2012), and Vitamin D deficiency. Surgical: Ms. Quinby  has a past surgical history that includes Laparoscopic hysterectomy; Tubal ligation; Back surgery; Abdominal hysterectomy; Hallux valgus [place] (Left, 02/09/2019); and Hammer toe surgery (Left, 02/09/2019). Family: family history includes Alcohol abuse in her brother; Cancer in her mother; Diabetes in her sister, sister, and son; Heart disease in her brother and father; Hypertension in her father, sister, sister, and son; Kidney disease in her sister; Rheum arthritis in her  sister; Sarcoidosis in her sister.  Constitutional Exam  General appearance: Well nourished, well developed, and well hydrated. In no apparent acute distress Vitals:   05/11/23 1305  BP: 110/76  Pulse: 70  Resp: 16  Temp: (!) 97.4 F (36.3 C)  TempSrc: Temporal  SpO2: 100%  Weight: 184 lb (83.5 kg)  Height: 5\' 5"  (1.651 m)   BMI Assessment: Estimated body mass index is 30.62 kg/m as calculated from the following:   Height as of this encounter: 5\' 5"  (1.651 m).   Weight as of this encounter: 184 lb (83.5 kg).  BMI interpretation table: BMI level Category Range association with higher incidence of chronic pain  <18 kg/m2 Underweight   18.5-24.9 kg/m2 Ideal body weight   25-29.9 kg/m2 Overweight Increased incidence by 20%  30-34.9 kg/m2 Obese (Class I) Increased incidence by 68%  35-39.9 kg/m2 Severe obesity (Class II) Increased incidence by 136%  >40 kg/m2 Extreme obesity (Class III) Increased incidence by 254%   Patient's current BMI Ideal Body weight  Body mass index is 30.62 kg/m. Ideal body weight: 57 kg (125 lb 10.6 oz) Adjusted ideal body weight: 67.6 kg (149 lb)   BMI Readings from Last 4 Encounters:  05/11/23 30.62 kg/m  04/20/23 28.79 kg/m  05/08/22 27.97 kg/m  03/12/22 27.97 kg/m   Wt Readings from Last 4 Encounters:  05/11/23 184 lb (83.5 kg)  04/20/23 173 lb (78.5 kg)  05/08/22 173 lb 4.5 oz (78.6 kg)  03/12/22 173 lb 4.8 oz (78.6 kg)    Psych/Mental status: Alert, oriented x 3 (person, place, & time)       Eyes: PERLA Respiratory: No evidence of acute respiratory distress  Physical Exam  MUSCULOSKELETAL: Hyperextension and rotation of the lumbar spine elicited pain, more pronounced on the left side. Straight leg raise test did not produce pain radiating down the legs. Decreased range of motion with left hip and pain in the lateral aspect of the hip noted. NEUROLOGICAL: Left sided weakness reported, affecting stability and favoring right side  during ambulation.       Assessment & Plan  Primary Diagnosis & Pertinent Problem List: The primary encounter diagnosis was Chronic pain syndrome. Diagnoses of Chronic low back pain (1ry area of Pain) (Bilateral) w/ sciatica (Bilateral), Chronic lower extremity pain (2ry area of Pain) (Bilateral) (R>L), Lumbar Grade 1 Anterolisthesis of L3/L4 & L4/L5, Abnormal MRI, lumbar spine (12/16/2022) [facility]), Lower extremity weakness (Left), Frequent falls, and Chronic anticoagulation (Eliquis) were also pertinent to this visit. Visit Diagnosis: 1. Chronic pain syndrome   2. Chronic low back pain (1ry area of Pain) (Bilateral) w/ sciatica (Bilateral)   3. Chronic lower extremity pain (2ry area of Pain) (Bilateral) (R>L)   4. Lumbar Grade 1 Anterolisthesis of L3/L4 & L4/L5   5. Abnormal MRI, lumbar spine (12/16/2022) [facility])   6. Lower extremity weakness (Left)   7. Frequent falls   8. Chronic anticoagulation (Eliquis)    Problems updated and reviewed during this visit: Problem  Lower extremity weakness (Left)  Frequent Falls    Plan of Care  Assessment and Plan    Chronic Low Back Pain with Radiculopathy   She exhibits chronic low back pain, predominantly on the left side, extending down the left leg. Diagnostic tests reveal grade 1 anterolisthesis of L3 over L4 and L4 over L5, diminished disc height, and facet joint degeneration at the L3-4 and L4-5 levels, likely stemming from facet and SI joint issues. Left leg weakness is observed, possibly due to nerve compression. We discussed the risks, benefits, and alternatives of diagnostic injections and radiofrequency ablation, including potential temporary pain, infection, bleeding, pain relief, and diagnostic clarity. Alternatives discussed include conservative management and surgery, noting surgery's poor track record and insurance challenges. We will order diagnostic injections for the SI and facet joints, refer her to a neurologist for a nerve conduction  test on the left lower extremity, schedule the procedure with sedation ensuring she has a driver, and instruct her to stop Eliquis 3 days before the procedure.  Elevated Sed Rate   Her sedimentation rate is elevated, indicating chronic inflammation, consistent with previous records. We will monitor for signs of inflammation and follow up as needed.  Elevated Liver Enzyme   Her ALT is slightly elevated at 131 IU/L. We will follow up on this elevated liver enzyme to determine the cause.  Elevated Blood Sugar   Her blood sugar level is elevated at 162 mg/dL, though she was not fasting at the time of the test. We will monitor her blood sugar levels and consider a fasting test if necessary.  General Health Maintenance   We discussed the importance of monitoring her blood sugar and liver enzyme levels and provided an after-visit summary with information on spondylolisthesis and pain patterns.  Follow-up   We will schedule a follow-up appointment after the diagnostic injections, arrange the nerve conduction test with the neurologist, and follow up on the elevated liver enzyme and blood sugar levels.      Pharmacotherapy (Medications Ordered): No orders of the defined types were placed in this encounter.  Procedure Orders    No procedure(s) ordered today   Lab Orders  No laboratory test(s) ordered today  Imaging Orders  No imaging studies ordered today   Referral Orders  No referral(s) requested today    Pharmacological management:  Opioid Analgesics: I will not be prescribing any opioids at this time Membrane stabilizer: I will not be prescribing any at this time Muscle relaxant: I will not be prescribing any at this time NSAID: I will not be prescribing any at this time Other analgesic(s): I will not be prescribing any at this time      Interventional Therapies  Risk Factors  Considerations  Medical Comorbidities:  Eliquis Anticoagulation: (Stop: 3 days  Restart: 6 hours)   A-Fib  BA  IDDM  liver cirrhosis     Planned  Pending:   See the above orders   Under consideration:   Pending   Completed:   None at this time   Therapeutic  Palliative (PRN) options:   None established   Completed by other providers:   Therapeutic left sacroiliac joint inj. x3 (03/29/2015, 04/16/2020, 09/08/2022) by Redge Gainer, DO ([facility])  Therapeutic knee injections by orthopedics Therapeutic shoulder injections by [facility]       Provider-requested follow-up: Return for (ECT): (L) L-FCT Blk #1, (Blood Thinner Protocol). Recent Visits Date Type Provider Dept  04/20/23 Office Visit Delano Metz, MD [facility]  Showing recent visits within past 90 days and meeting all other requirements Today's Visits Date Type Provider Dept  05/11/23 Office Visit Delano Metz, MD [facility]  Showing today's visits and meeting all other requirements Future Appointments No visits were found meeting these conditions. Showing future appointments within next 90 days and meeting all other requirements   Primary Care Physician: Vernard Gambles, MD  Duration of encounter: 40 minutes.  Total time on encounter, as per AMA guidelines included both the face-to-face and non-face-to-face time personally spent by the physician and/or other qualified health care professional(s) on the day of the encounter (includes time in activities that require the physician or other qualified health care professional and does not include time in activities normally performed by clinical staff). Physician's time may include the following activities when performed: Preparing to see the patient (e.g., pre-charting review of records, searching for previously ordered imaging, lab work, and nerve conduction tests) Review of prior analgesic pharmacotherapies. Reviewing PMP Interpreting ordered tests (e.g., lab work, imaging, nerve conduction tests) Performing  post-procedure evaluations, including interpretation of diagnostic procedures Obtaining and/or reviewing separately obtained history Performing a medically appropriate examination and/or evaluation Counseling and educating the patient/family/caregiver Ordering medications, tests, or procedures Referring and communicating with other health care professionals (when not separately reported) Documenting clinical information in the electronic or other health record Independently interpreting results (not separately reported) and communicating results to the patient/ family/caregiver Care coordination (not separately reported)  Note by: Oswaldo Done, MD (TTS technology used. I apologize for any typographical errors that were not detected and corrected.) Date: 05/11/2023; Time: 4:20 PM

## 2023-05-11 ENCOUNTER — Encounter: Payer: Self-pay | Admitting: Pain Medicine

## 2023-05-11 ENCOUNTER — Ambulatory Visit: Payer: Medicare HMO | Attending: Pain Medicine | Admitting: Pain Medicine

## 2023-05-11 VITALS — BP 110/76 | HR 70 | Temp 97.4°F | Resp 16 | Ht 65.0 in | Wt 184.0 lb

## 2023-05-11 DIAGNOSIS — M5441 Lumbago with sciatica, right side: Secondary | ICD-10-CM | POA: Diagnosis not present

## 2023-05-11 DIAGNOSIS — G8929 Other chronic pain: Secondary | ICD-10-CM | POA: Insufficient documentation

## 2023-05-11 DIAGNOSIS — M4316 Spondylolisthesis, lumbar region: Secondary | ICD-10-CM | POA: Insufficient documentation

## 2023-05-11 DIAGNOSIS — M79605 Pain in left leg: Secondary | ICD-10-CM | POA: Insufficient documentation

## 2023-05-11 DIAGNOSIS — M5442 Lumbago with sciatica, left side: Secondary | ICD-10-CM | POA: Insufficient documentation

## 2023-05-11 DIAGNOSIS — R937 Abnormal findings on diagnostic imaging of other parts of musculoskeletal system: Secondary | ICD-10-CM | POA: Insufficient documentation

## 2023-05-11 DIAGNOSIS — M79604 Pain in right leg: Secondary | ICD-10-CM | POA: Diagnosis not present

## 2023-05-11 DIAGNOSIS — M5459 Other low back pain: Secondary | ICD-10-CM

## 2023-05-11 DIAGNOSIS — G894 Chronic pain syndrome: Secondary | ICD-10-CM | POA: Insufficient documentation

## 2023-05-11 DIAGNOSIS — R296 Repeated falls: Secondary | ICD-10-CM | POA: Diagnosis present

## 2023-05-11 DIAGNOSIS — R29898 Other symptoms and signs involving the musculoskeletal system: Secondary | ICD-10-CM | POA: Insufficient documentation

## 2023-05-11 DIAGNOSIS — Z7901 Long term (current) use of anticoagulants: Secondary | ICD-10-CM | POA: Insufficient documentation

## 2023-05-11 NOTE — Progress Notes (Signed)
Safety precautions to be maintained throughout the outpatient stay will include: orient to surroundings, keep bed in low position, maintain call bell within reach at all times, provide assistance with transfer out of bed and ambulation.  

## 2023-05-11 NOTE — Patient Instructions (Addendum)
______________________________________________________________________    Procedure instructions  Stop blood-thinners  Do not eat or drink fluids (other than water) for 6 hours before your procedure  No water for 2 hours before your procedure  Take your blood pressure medicine with a sip of water  Arrive 30 minutes before your appointment  If sedation is planned, bring suitable driver. Pennie Banter, Benedetto Goad, & public transportation are NOT APPROVED)  Carefully read the "Preparing for your procedure" detailed instructions  If you have questions call us at [phone]  Procedure appointments are for procedures only. NO medication refills or new problem evaluations.   ______________________________________________________________________      ______________________________________________________________________    Preparing for your procedure  Appointments: If you think you may not be able to keep your appointment, call 24-48 hours in advance to cancel. We need time to make it available to others.  Procedure visits are for procedures only. During your procedure appointment there will be: NO Prescription Refills*. NO medication changes or discussions*. NO discussion of disability issues*. NO unrelated pain problem evaluations*. NO evaluations to order other pain procedures*. *These will be addressed at a separate and distinct evaluation encounter on the provider's evaluation schedule and not during procedure days.  Instructions: Food intake: Avoid eating anything solid for at least 8 hours prior to your procedure. Clear liquid intake: You may take clear liquids such as water up to 2 hours prior to your procedure. (No carbonated drinks. No soda.) Transportation: Unless otherwise stated by your physician, bring a driver. (Driver cannot be a Market researcher, Pharmacist, community, or any other form of public transportation.) Morning Medicines: Except for blood thinners, take all of your other morning  medications with a sip of water. Make sure to take your heart and blood pressure medicines. If your blood pressure's lower number is above 100, the case will be rescheduled. Blood thinners: Make sure to stop your blood thinners as instructed.  If you take a blood thinner, but were not instructed to stop it, call our office [phone] and ask to talk to a nurse. Not stopping a blood thinner prior to certain procedures could lead to serious complications. Diabetics on insulin: Notify the staff so that you can be scheduled 1st case in the morning. If your diabetes requires high dose insulin, take only  of your normal insulin dose the morning of the procedure and notify the staff that you have done so. Preventing infections: Shower with an antibacterial soap the morning of your procedure.  Build-up your immune system: Take 1000 mg of Vitamin C with every meal (3 times a day) the day prior to your procedure. Antibiotics: Inform the nursing staff if you are taking any antibiotics or if you have any conditions that may require antibiotics prior to procedures. (Example: recent joint implants)   Pregnancy: If you are pregnant make sure to notify the nursing staff. Not doing so may result in injury to the fetus, including death.  Sickness: If you have a cold, fever, or any active infections, call and cancel or reschedule your procedure. Receiving steroids while having an infection may result in complications. Arrival: You must be in the facility at least 30 minutes prior to your scheduled procedure. Tardiness: Your scheduled time is also the cutoff time. If you do not arrive at least 15 minutes prior to your procedure, you will be rescheduled.  Children: Do not bring any children with you. Make arrangements to keep them home. Dress appropriately: There is always a possibility that your clothing may get  soiled. Avoid long dresses. Valuables: Do not bring any jewelry or valuables.  Reasons to call and  reschedule or cancel your procedure: (Following these recommendations will minimize the risk of a serious complication.) Surgeries: Avoid having procedures within 2 weeks of any surgery. (Avoid for 2 weeks before or after any surgery). Flu Shots: Avoid having procedures within 2 weeks of a flu shots or . (Avoid for 2 weeks before or after immunizations). Barium: Avoid having a procedure within 7-10 days after having had a radiological study involving the use of radiological contrast. (Myelograms, Barium swallow or enema study). Heart attacks: Avoid any elective procedures or surgeries for the initial 6 months after a "Myocardial Infarction" (Heart Attack). Blood thinners: It is imperative that you stop these medications before procedures. Let us know if you if you take any blood thinner.  Infection: Avoid procedures during or within two weeks of an infection (including chest colds or gastrointestinal problems). Symptoms associated with infections include: Localized redness, fever, chills, night sweats or profuse sweating, burning sensation when voiding, cough, congestion, stuffiness, runny nose, sore throat, diarrhea, nausea, vomiting, cold or Flu symptoms, recent or current infections. It is specially important if the infection is over the area that we intend to treat. Heart and lung problems: Symptoms that may suggest an active cardiopulmonary problem include: cough, chest pain, breathing difficulties or shortness of breath, dizziness, ankle swelling, uncontrolled high or unusually low blood pressure, and/or palpitations. If you are experiencing any of these symptoms, cancel your procedure and contact your primary care physician for an evaluation.  Remember:  Regular Business hours are:  Monday to Thursday 8:00 AM to 4:00 PM  Provider's Schedule: Delano Metz, MD:  Procedure days: Tuesday and Thursday 7:30 AM to 4:00 PM  Edward Jolly, MD:  Procedure days: Monday and Wednesday 7:30 AM to 4:00  PM Last  Updated: 04/21/2023 ______________________________________________________________________      ______________________________________________________________________    General Risks and Possible Complications  Patient Responsibilities: It is important that you read this as it is part of your informed consent. It is our duty to inform you of the risks and possible complications associated with treatments offered to you. It is your responsibility as a patient to read this and to ask questions about anything that is not clear or that you believe was not covered in this document.  Patient's Rights: You have the right to refuse treatment. You also have the right to change your mind, even after initially having agreed to have the treatment done. However, under this last option, if you wait until the last second to change your mind, you may be charged for the materials used up to that point.  Introduction: Medicine is not an Visual merchandiser. Everything in Medicine, including the lack of treatment(s), carries the potential for danger, harm, or loss (which is by definition: Risk). In Medicine, a complication is a secondary problem, condition, or disease that can aggravate an already existing one. All treatments carry the risk of possible complications. The fact that a side effects or complications occurs, does not imply that the treatment was conducted incorrectly. It must be clearly understood that these can happen even when everything is done following the highest safety standards.  No treatment: You can choose not to proceed with the proposed treatment alternative. The "PRO(s)" would include: avoiding the risk of complications associated with the therapy. The "CON(s)" would include: not getting any of the treatment benefits. These benefits fall under one of three categories: diagnostic; therapeutic; and/or  palliative. Diagnostic benefits include: getting information which can ultimately lead to  improvement of the disease or symptom(s). Therapeutic benefits are those associated with the successful treatment of the disease. Finally, palliative benefits are those related to the decrease of the primary symptoms, without necessarily curing the condition (example: decreasing the pain from a flare-up of a chronic condition, such as incurable terminal cancer).  General Risks and Complications: These are associated to most interventional treatments. They can occur alone, or in combination. They fall under one of the following six (6) categories: no benefit or worsening of symptoms; bleeding; infection; nerve damage; allergic reactions; and/or death. No benefits or worsening of symptoms: In Medicine there are no guarantees, only probabilities. No healthcare provider can ever guarantee that a medical treatment will work, they can only state the probability that it may. Furthermore, there is always the possibility that the condition may worsen, either directly, or indirectly, as a consequence of the treatment. Bleeding: This is more common if the patient is taking a blood thinner, either prescription or over the counter (example: Goody Powders, Fish oil, Aspirin, Garlic, etc.), or if suffering a condition associated with impaired coagulation (example: Hemophilia, cirrhosis of the liver, low platelet counts, etc.). However, even if you do not have one on these, it can still happen. If you have any of these conditions, or take one of these drugs, make sure to notify your treating physician. Infection: This is more common in patients with a compromised immune system, either due to disease (example: diabetes, cancer, human immunodeficiency virus [HIV], etc.), or due to medications or treatments (example: therapies used to treat cancer and rheumatological diseases). However, even if you do not have one on these, it can still happen. If you have any of these conditions, or take one of these drugs, make sure to notify  your treating physician. Nerve Damage: This is more common when the treatment is an invasive one, but it can also happen with the use of medications, such as those used in the treatment of cancer. The damage can occur to small secondary nerves, or to large primary ones, such as those in the spinal cord and brain. This damage may be temporary or permanent and it may lead to impairments that can range from temporary numbness to permanent paralysis and/or brain death. Allergic Reactions: Any time a substance or material comes in contact with our body, there is the possibility of an allergic reaction. These can range from a mild skin rash (contact dermatitis) to a severe systemic reaction (anaphylactic reaction), which can result in death. Death: In general, any medical intervention can result in death, most of the time due to an unforeseen complication. ______________________________________________________________________      ______________________________________________________________________    Blood Thinners  IMPORTANT NOTICE:  If you take any of these, make sure to notify the nursing staff.  Failure to do so may result in serious injury.  Recommended time intervals to stop and restart blood-thinners, before & after invasive procedures  Generic Name Brand Name Pre-procedure: Stop medication for this amount of time before your procedure: Post-procedure: Wait this amount of time after the procedure before restarting your medication:  Abciximab Reopro 15 days 2 hrs  Alteplase Activase 10 days 10 days  Anagrelide Agrylin    Apixaban Eliquis 3 days 6 hrs  Cilostazol Pletal 3 days 5 hrs  Clopidogrel Plavix 7-10 days 2 hrs  Dabigatran Pradaxa 5 days 6 hrs  Dalteparin Fragmin 24 hours 4 hrs  Dipyridamole Aggrenox 11days 2  hrs  Edoxaban [place] 3 days 2 hrs  Enoxaparin  Lovenox 24 hours 4 hrs  Eptifibatide Integrillin 8 hours 2 hrs  Fondaparinux  Arixtra 72 hours 12 hrs   Hydroxychloroquine Plaquenil 11 days   Prasugrel Effient 7-10 days 6 hrs  Reteplase Retavase 10 days 10 days  Rivaroxaban Xarelto 3 days 6 hrs  Ticagrelor Brilinta 5-7 days 6 hrs  Ticlopidine Ticlid 10-14 days 2 hrs  Tinzaparin Innohep 24 hours 4 hrs  Tirofiban Aggrastat 8 hours 2 hrs  Warfarin Coumadin 5 days 2 hrs   Other medications with blood-thinning effects  Product indications Generic (Brand) names Note  Cholesterol Lipitor Stop 4 days before procedure  Blood thinner (injectable) Heparin (LMW or LMWH Heparin) Stop 24 hours before procedure  Cancer Ibrutinib (Imbruvica) Stop 7 days before procedure  Malaria/Rheumatoid Hydroxychloroquine (Plaquenil) Stop 11 days before procedure  Thrombolytics  10 days before or after procedures   Over-the-counter (OTC) Products with blood-thinning effects  Product Common names Stop Time  Aspirin > 325 mg Goody Powders, Excedrin, etc. 11 days  Aspirin <= 81 mg  7 days  Fish oil  4 days  Garlic supplements  7 days  Ginkgo biloba  36 hours  Ginseng  24 hours  NSAIDs Ibuprofen, Naprosyn, etc. 3 days  Vitamin E  4 days   ______________________________________________________________________      ____________________________________________________________________________________________  Spondylolisthesis  Spondylolisthesis is a condition that occurs when a vertebra in the spine slips out of place, usually in the lower back. Symptoms can vary from mild to severe, and a person may have no symptoms.  Some common symptoms include:  Back pain, especially chronic pain  Pain that radiates down the legs  Pain that worsens with exercise  Tightness in the hamstrings  Neck stiffness  Loss of spine flexibility  Weakness in the legs or trouble walking  Numbness and tingling in the groin and/or buttocks   Some causes of spondylolisthesis include: Birth defects. Sudden injury. Abnormal wear on the cartilage and bones, such as arthritis. Bone  disease and fractures. Certain sports activities, such as gymnastics, weightlifting, and football.  A doctor can diagnose spondylolisthesis with a physical exam, X-rays, and possibly a CT scan.    Forward slippage is known as "Anterolisthesis".  Backward slippage is known as "Retrolisthesis".   Pathophysiology of Spondylolisthesis:   Grading Classification of Spondylolisthesis Grade I spondylolisthesis is 1 to 25% slippage, grade II is up to 50% slippage, grade III is up to 75% slippage, and grade IV is 76-100% slippage. If there is more than 100% slippage, it is known as spondyloptosis or grade V spondylolisthesis.       ____________________________________________________________________________________________    ____________________________________________________________________________________________  Understanding MRI Findings           Facet Joint Pain Patterns      Nerve Root Pain Patterns (Dermatomes)    ____________________________________________________________________________________________

## 2023-05-19 DIAGNOSIS — M5459 Other low back pain: Secondary | ICD-10-CM | POA: Insufficient documentation

## 2023-05-20 ENCOUNTER — Telehealth: Payer: Self-pay

## 2023-05-20 NOTE — Telephone Encounter (Signed)
 When I called the patient to schedule her procedure, I told her we got clearance to stop her eliquis  for 3 days. She brought up that she was reading the papers and it said plaquenil ( I probably spelled that wrong) should be stopped for 11 days and no one said anything about that to her.  Can someone call her to clarify and see if she needs to stop that medicine? She is scheduled for 06/09/23

## 2023-05-20 NOTE — Telephone Encounter (Signed)
 Did you call her or, or do I need to?

## 2023-06-09 ENCOUNTER — Ambulatory Visit
Admission: RE | Admit: 2023-06-09 | Discharge: 2023-06-09 | Disposition: A | Discharge status: Home / Self Care | Payer: Medicare HMO | Source: Ambulatory Visit | Attending: Pain Medicine | Admitting: Pain Medicine

## 2023-06-09 ENCOUNTER — Encounter: Payer: Self-pay | Admitting: Pain Medicine

## 2023-06-09 ENCOUNTER — Ambulatory Visit: Payer: Medicare HMO | Attending: Pain Medicine | Admitting: Pain Medicine

## 2023-06-09 VITALS — BP 140/93 | HR 75 | Temp 97.3°F | Resp 16 | Ht 65.0 in | Wt 180.0 lb

## 2023-06-09 DIAGNOSIS — M8939 Hypertrophy of bone, multiple sites: Secondary | ICD-10-CM | POA: Diagnosis not present

## 2023-06-09 DIAGNOSIS — Z7901 Long term (current) use of anticoagulants: Secondary | ICD-10-CM | POA: Diagnosis present

## 2023-06-09 DIAGNOSIS — M5442 Lumbago with sciatica, left side: Secondary | ICD-10-CM | POA: Insufficient documentation

## 2023-06-09 DIAGNOSIS — G8929 Other chronic pain: Secondary | ICD-10-CM | POA: Diagnosis present

## 2023-06-09 DIAGNOSIS — R937 Abnormal findings on diagnostic imaging of other parts of musculoskeletal system: Secondary | ICD-10-CM | POA: Diagnosis present

## 2023-06-09 DIAGNOSIS — M47816 Spondylosis without myelopathy or radiculopathy, lumbar region: Secondary | ICD-10-CM | POA: Insufficient documentation

## 2023-06-09 DIAGNOSIS — I4891 Unspecified atrial fibrillation: Secondary | ICD-10-CM | POA: Diagnosis present

## 2023-06-09 DIAGNOSIS — M5459 Other low back pain: Secondary | ICD-10-CM | POA: Insufficient documentation

## 2023-06-09 DIAGNOSIS — M5441 Lumbago with sciatica, right side: Secondary | ICD-10-CM | POA: Diagnosis present

## 2023-06-09 DIAGNOSIS — M545 Low back pain, unspecified: Secondary | ICD-10-CM | POA: Insufficient documentation

## 2023-06-09 DIAGNOSIS — M47817 Spondylosis without myelopathy or radiculopathy, lumbosacral region: Secondary | ICD-10-CM | POA: Diagnosis not present

## 2023-06-09 DIAGNOSIS — Z5189 Encounter for other specified aftercare: Secondary | ICD-10-CM | POA: Insufficient documentation

## 2023-06-09 DIAGNOSIS — M5451 Vertebrogenic low back pain: Secondary | ICD-10-CM | POA: Insufficient documentation

## 2023-06-09 DIAGNOSIS — M4316 Spondylolisthesis, lumbar region: Secondary | ICD-10-CM | POA: Diagnosis not present

## 2023-06-09 DIAGNOSIS — R930 Abnormal findings on diagnostic imaging of skull and head, not elsewhere classified: Secondary | ICD-10-CM

## 2023-06-09 DIAGNOSIS — M48061 Spinal stenosis, lumbar region without neurogenic claudication: Secondary | ICD-10-CM | POA: Insufficient documentation

## 2023-06-09 MED ORDER — PENTAFLUOROPROP-TETRAFLUOROETH EX AERO: INHALATION_SPRAY | Freq: Once | CUTANEOUS | Status: DC

## 2023-06-09 MED ORDER — ROPIVACAINE HCL 2 MG/ML IJ SOLN
INTRAMUSCULAR | Status: AC
  Filled 2023-06-09: qty 20

## 2023-06-09 MED ORDER — MIDAZOLAM HCL 5 MG/5ML IJ SOLN
0.5000 mg | Freq: Once | INTRAMUSCULAR | Status: AC
  Administered 2023-06-09: 2 mg via INTRAVENOUS

## 2023-06-09 MED ORDER — TRIAMCINOLONE ACETONIDE 40 MG/ML IJ SUSP
80.0000 mg | Freq: Once | INTRAMUSCULAR | Status: AC
Start: 2023-06-09 — End: 2023-06-09
  Administered 2023-06-09: 80 mg

## 2023-06-09 MED ORDER — LIDOCAINE HCL 2 % IJ SOLN
20.0000 mL | Freq: Once | INTRAMUSCULAR | Status: AC
Start: 2023-06-09 — End: 2023-06-09
  Administered 2023-06-09: 400 mg

## 2023-06-09 MED ORDER — MIDAZOLAM HCL 5 MG/5ML IJ SOLN
INTRAMUSCULAR | Status: AC
  Filled 2023-06-09: qty 5

## 2023-06-09 MED ORDER — FENTANYL CITRATE (PF) 100 MCG/2ML IJ SOLN
INTRAMUSCULAR | Status: AC
  Filled 2023-06-09: qty 2

## 2023-06-09 MED ORDER — FENTANYL CITRATE (PF) 100 MCG/2ML IJ SOLN
25.0000 ug | INTRAMUSCULAR | Status: DC | PRN
  Administered 2023-06-09: 50 ug via INTRAVENOUS

## 2023-06-09 MED ORDER — LIDOCAINE HCL 2 % IJ SOLN
INTRAMUSCULAR | Status: AC
  Filled 2023-06-09: qty 20

## 2023-06-09 MED ORDER — TRIAMCINOLONE ACETONIDE 40 MG/ML IJ SUSP
INTRAMUSCULAR | Status: AC
Start: 2023-06-09 — End: ?
  Filled 2023-06-09: qty 2

## 2023-06-09 MED ORDER — ROPIVACAINE HCL 2 MG/ML IJ SOLN
18.0000 mL | Freq: Once | INTRAMUSCULAR | Status: AC
Start: 2023-06-09 — End: 2023-06-09
  Administered 2023-06-09: 18 mL via PERINEURAL

## 2023-06-09 NOTE — Progress Notes (Signed)
Safety precautions to be maintained throughout the outpatient stay will include: orient to surroundings, keep bed in low position, maintain call bell within reach at all times, provide assistance with transfer out of bed and ambulation.

## 2023-06-09 NOTE — Progress Notes (Signed)
PROVIDER NOTE: Interpretation of information contained herein should be left to medically-trained personnel. Specific patient instructions are provided elsewhere under "Patient Instructions" section of medical record. This document was created in part using STT-dictation technology, any transcriptional errors that may result from this process are unintentional.  Patient: Shannon [name] Type: Established DOB: July 25, 1955 MRN: [id number] PCP: Vernard Gambles, MD  Service: Procedure DOS: 06/09/2023 Setting: Ambulatory Location: Ambulatory outpatient facility Delivery: Face-to-face Provider: Oswaldo Done, MD Specialty: Interventional Pain Management Specialty designation: 09 Location: Outpatient facility Ref. Prov.: No ref. provider found       Interventional Therapy   Type: Lumbar Facet, Medial Branch Block(s) (w/ fluoroscopic mapping) #1  Laterality: Bilateral  Left Levels: L2, L3, L4, L5, and S1 Medial Branch Level(s). Injecting these levels blocks the L3-4, L4-5, and L5-S1 lumbar facet joints. Right Levels: L3, L4, L5, and S1 Medial Branch Level(s). Injecting these levels blocks the L4-5, and L5-S1 lumbar facet joints. Imaging: Fluoroscopic guidance Spinal ([id number]) Anesthesia: Local anesthesia (1-2% Lidocaine) Anxiolysis: IV Versed 2.0 mg Sedation: Moderate Sedation Fentanyl 1 mL (50 mcg) DOS: 06/09/2023 Performed by: Oswaldo Done, MD  Primary Purpose: Diagnostic/Therapeutic Indications: Low back pain severe enough to impact quality of life or function. 1. Chronic low back pain (Bilateral) w/o sciatica   2. Lumbar Grade 1 Anterolisthesis of L3/L4 & L4/L5   3. Lumbar facet joint pain   4. Lumbar facet hypertrophy   5. Lumbosacral facet hypertrophy   6. Spondylosis without myelopathy or radiculopathy, lumbosacral region   7. Abnormal MRI, lumbar spine (12/16/2022) [facility])    Chronic anticoagulation (Eliquis)    Atrial fibrillation with RVR (HCC)    NAS-11 Pain score:    Pre-procedure: 6 /10   Post-procedure: 0-No pain/10     Position / Prep / Materials:  Position: Prone  Prep solution: ChloraPrep (2% chlorhexidine gluconate and 70% isopropyl alcohol) Area Prepped: Posterolateral Lumbosacral Spine (Wide prep: From the lower border of the scapula down to the end of the tailbone and from flank to flank.)  Materials:  Tray: Block Needle(s):  Type: Spinal  Gauge (G): 22  Length: 3.5-in Qty: 4      H&P (Pre-op Assessment):  Shannon [name] is a 68 y.o. (year old), female patient, seen today for interventional treatment. She  has a past surgical history that includes Laparoscopic hysterectomy; Tubal ligation; Back surgery; Abdominal hysterectomy; Hallux valgus austin (Left, 02/09/2019); and Hammer toe surgery (Left, 02/09/2019). Shannon [name] has a current medication list which includes the following prescription(s): acetaminophen, albuterol, apixaban, azathioprine, blood glucose meter kit and supplies, blood glucose meter kit and supplies, one touch ultra system kit, celecoxib, vitamin d3, furosemide, onetouch verio, tresiba flextouch, pen needles, lactulose, lidocaine, loratadine, losartan, magnesium oxide -mg supplement, metformin, metoprolol tartrate, miconazole 3, nystatin, omeprazole, prednisone, simethicone, spironolactone, tramadol, trazodone, and cyanocobalamin, and the following Facility-Administered Medications: fentanyl and pentafluoroprop-tetrafluoroeth. Her primarily concern today is the Back Pain  Initial Vital Signs:  Pulse/HCG Rate: 75ECG Heart Rate: 70 (nsr) Temp: (!) 97.3 F (36.3 C) Resp: 16 BP: 136/84 SpO2: 99 %  BMI: Estimated body mass index is 29.95 kg/m as calculated from the following:   Height as of this encounter: 5\' 5"  (1.651 m).   Weight as of this encounter: 180 lb (81.6 kg).  Risk Assessment: Allergies: Reviewed. She is allergic to hydrocodone, lipitor [atorvastatin calcium], milk-related compounds, oxycodone, wellbutrin  [bupropion], and adhesive [tape].  Allergy Precautions: None required Coagulopathies: Reviewed. None identified.  Blood-thinner therapy: None at this time Active  Infection(s): Reviewed. None identified. Shannon [name] is afebrile  Site Confirmation: Shannon [name] was asked to confirm the procedure and laterality before marking the site Procedure checklist: Completed Consent: Before the procedure and under the influence of no sedative(s), amnesic(s), or anxiolytics, the patient was informed of the treatment options, risks and possible complications. To fulfill our ethical and legal obligations, as recommended by the American Medical Association's Code of Ethics, I have informed the patient of my clinical impression; the nature and purpose of the treatment or procedure; the risks, benefits, and possible complications of the intervention; the alternatives, including doing nothing; the risk(s) and benefit(s) of the alternative treatment(s) or procedure(s); and the risk(s) and benefit(s) of doing nothing. The patient was provided information about the general risks and possible complications associated with the procedure. These may include, but are not limited to: failure to achieve desired goals, infection, bleeding, organ or nerve damage, allergic reactions, paralysis, and death. In addition, the patient was informed of those risks and complications associated to Spine-related procedures, such as failure to decrease pain; infection (i.e.: Meningitis, epidural or intraspinal abscess); bleeding (i.e.: epidural hematoma, subarachnoid hemorrhage, or any other type of intraspinal or peri-dural bleeding); organ or nerve damage (i.e.: Any type of peripheral nerve, nerve root, or spinal cord injury) with subsequent damage to sensory, motor, and/or autonomic systems, resulting in permanent pain, numbness, and/or weakness of one or several areas of the body; allergic reactions; (i.e.: anaphylactic reaction); and/or  death. Furthermore, the patient was informed of those risks and complications associated with the medications. These include, but are not limited to: allergic reactions (i.e.: anaphylactic or anaphylactoid reaction(s)); adrenal axis suppression; blood sugar elevation that in diabetics may result in ketoacidosis or comma; water retention that in patients with history of congestive heart failure may result in shortness of breath, pulmonary edema, and decompensation with resultant heart failure; weight gain; swelling or edema; medication-induced neural toxicity; particulate matter embolism and blood vessel occlusion with resultant organ, and/or nervous system infarction; and/or aseptic necrosis of one or more joints. Finally, the patient was informed that Medicine is not an exact science; therefore, there is also the possibility of unforeseen or unpredictable risks and/or possible complications that may result in a catastrophic outcome. The patient indicated having understood very clearly. We have given the patient no guarantees and we have made no promises. Enough time was given to the patient to ask questions, all of which were answered to the patient's satisfaction. Shannon [name] has indicated that she wanted to continue with the procedure. Attestation: I, the ordering provider, attest that I have discussed with the patient the benefits, risks, side-effects, alternatives, likelihood of achieving goals, and potential problems during recovery for the procedure that I have provided informed consent. Date  Time: 06/09/2023  8:46 AM   Pre-Procedure Preparation:  Monitoring: As per clinic protocol. Respiration, ETCO2, SpO2, BP, heart rate and rhythm monitor placed and checked for adequate function Safety Precautions: Patient was assessed for positional comfort and pressure points before starting the procedure. Time-out: I initiated and conducted the "Time-out" before starting the procedure, as per protocol. The  patient was asked to participate by confirming the accuracy of the "Time Out" information. Verification of the correct person, site, and procedure were performed and confirmed by me, the nursing staff, and the patient. "Time-out" conducted as per Joint Commission's Universal Protocol (UP.01.01.01). Time: 0931 Start Time: 0931 hrs.  Description of Procedure:          Laterality: (see above) Targeted Levels: (  see above)  Safety Precautions: Aspiration looking for blood return was conducted prior to all injections. At no point did we inject any substances, as a needle was being advanced. Before injecting, the patient was told to immediately notify me if she was experiencing any new onset of "ringing in the ears, or metallic taste in the mouth". No attempts were made at seeking any paresthesias. Safe injection practices and needle disposal techniques used. Medications properly checked for expiration dates. SDV (single dose vial) medications used. After the completion of the procedure, all disposable equipment used was discarded in the proper designated medical waste containers. Local Anesthesia: Protocol guidelines were followed. The patient was positioned over the fluoroscopy table. The area was prepped in the usual manner. The time-out was completed. The target area was identified using fluoroscopy. A 12-in long, straight, sterile hemostat was used with fluoroscopic guidance to locate the targets for each level blocked. Once located, the skin was marked with an approved surgical skin marker. Once all sites were marked, the skin (epidermis, dermis, and hypodermis), as well as deeper tissues (fat, connective tissue and muscle) were infiltrated with a small amount of a short-acting local anesthetic, loaded on a 10cc syringe with a 25G, 1.5-in  Needle. An appropriate amount of time was allowed for local anesthetics to take effect before proceeding to the next step. Local Anesthetic: Lidocaine 2.0% The unused  portion of the local anesthetic was discarded in the proper designated containers. Technical description of process:  L2 Medial Branch Nerve Block (MBB): The target area for the L2 medial branch is at the junction of the postero-lateral aspect of the superior articular process and the superior, posterior, and medial edge of the transverse process of L3. Under fluoroscopic guidance, a Quincke needle was inserted until contact was made with os over the superior postero-lateral aspect of the pedicular shadow (target area). After negative aspiration for blood, 0.5 mL of the nerve block solution was injected without difficulty or complication. The needle was removed intact. (Right side only) L3 Medial Branch Nerve Block (MBB): The target area for the L3 medial branch is at the junction of the postero-lateral aspect of the superior articular process and the superior, posterior, and medial edge of the transverse process of L4. Under fluoroscopic guidance, a Quincke needle was inserted until contact was made with os over the superior postero-lateral aspect of the pedicular shadow (target area). After negative aspiration for blood, 0.5 mL of the nerve block solution was injected without difficulty or complication. The needle was removed intact. (Bilateral) L4 Medial Branch Nerve Block (MBB): The target area for the L4 medial branch is at the junction of the postero-lateral aspect of the superior articular process and the superior, posterior, and medial edge of the transverse process of L5. Under fluoroscopic guidance, a Quincke needle was inserted until contact was made with os over the superior postero-lateral aspect of the pedicular shadow (target area). After negative aspiration for blood, 0.5 mL of the nerve block solution was injected without difficulty or complication. The needle was removed intact. (Bilateral) L5 Medial Branch Nerve Block (MBB): The target area for the L5 medial branch is at the junction of the  postero-lateral aspect of the superior articular process and the superior, posterior, and medial edge of the sacral ala. Under fluoroscopic guidance, a Quincke needle was inserted until contact was made with os over the superior postero-lateral aspect of the pedicular shadow (target area). After negative aspiration for blood, 0.5 mL of the nerve block solution  was injected without difficulty or complication. The needle was removed intact. (Bilateral) S1 Medial Branch Nerve Block (MBB): The target area for the S1 medial branch is at the posterior and inferior 6 o'clock position of the L5-S1 facet joint. Under fluoroscopic guidance, the Quincke needle inserted for the L5 MBB was redirected until contact was made with os over the inferior and postero aspect of the sacrum, at the 6 o' clock position under the L5-S1 facet joint (Target area). After negative aspiration for blood, 0.5 mL of the nerve block solution was injected without difficulty or complication. The needle was removed intact. (Bilateral)  Once the entire procedure was completed, the treated area was cleaned, making sure to leave some of the prepping solution back to take advantage of its long term bactericidal properties.         Illustration of the posterior view of the lumbar spine and the posterior neural structures. Laminae of L2 through S1 are labeled. DPRL5, dorsal primary ramus of L5; DPRS1, dorsal primary ramus of S1; DPR3, dorsal primary ramus of L3; FJ, facet (zygapophyseal) joint L3-L4; I, inferior articular process of L4; LB1, lateral branch of dorsal primary ramus of L1; IAB, inferior articular branches from L3 medial branch (supplies L4-L5 facet joint); IBP, intermediate branch plexus; MB3, medial branch of dorsal primary ramus of L3; NR3, third lumbar nerve root; S, superior articular process of L5; SAB, superior articular branches from L4 (supplies L4-5 facet joint also); TP3, transverse process of L3.   Facet Joint Innervation  (* possible contribution)  L1-2 T12, L1 (L2*)  Medial Branch  L2-3 L1, L2 (L3*)         "          "  L3-4 L2, L3 (L4*)         "          "  L4-5 L3, L4 (L5*)         "          "  L5-S1 L4, L5, S1          "          "    Vitals:   06/09/23 0939 06/09/23 0946 06/09/23 0956 06/09/23 1005  BP: (!) 123/93 (!) 131/96 (!) 139/92 (!) 140/93  Pulse:      Resp: 17 16 16 16   Temp:      SpO2: 100% 99% 99% 99%  Weight:      Height:         End Time: 0939 hrs.  Imaging Guidance (Spinal):          Type of Imaging Technique: Fluoroscopy Guidance (Spinal) Indication(s): Fluoroscopy guidance for needle placement to enhance accuracy in procedures requiring precise needle localization for targeted delivery of medication in or near specific anatomical locations not easily accessible without such real-time imaging assistance. Exposure Time: Please see nurses notes. Contrast: None used. Fluoroscopic Guidance: I was personally present during the use of fluoroscopy. "Tunnel Vision Technique" used to obtain the best possible view of the target area. Parallax error corrected before commencing the procedure. "Direction-depth-direction" technique used to introduce the needle under continuous pulsed fluoroscopy. Once target was reached, antero-posterior, oblique, and lateral fluoroscopic projection used confirm needle placement in all planes. Images permanently stored in EMR. Interpretation: No contrast injected. I personally interpreted the imaging intraoperatively. Adequate needle placement confirmed in multiple planes. Permanent images saved into the patient's record.  Post-operative Assessment:  Post-procedure Vital Signs:  Pulse/HCG Rate: 7568  Temp: (!) 97.3 F (36.3 C) Resp: 16 BP: (!) 140/93 SpO2: 99 %  EBL: None  Complications: No immediate post-treatment complications observed by team, or reported by patient.  Note: The patient tolerated the entire procedure well. A repeat set of vitals were  taken after the procedure and the patient was kept under observation following institutional policy, for this type of procedure. Post-procedural neurological assessment was performed, showing return to baseline, prior to discharge. The patient was provided with post-procedure discharge instructions, including a section on how to identify potential problems. Should any problems arise concerning this procedure, the patient was given instructions to immediately contact us, at any time, without hesitation. In any case, we plan to contact the patient by telephone for a follow-up status report regarding this interventional procedure.  Comments:  No additional relevant information.  Plan of Care (POC)  Orders:  Orders Placed This Encounter  Procedures   LUMBAR FACET(MEDIAL BRANCH NERVE BLOCK) MBNB    Scheduling Instructions:     Procedure: Lumbar facet block (AKA.: Lumbosacral medial branch nerve block)     Side: Bilateral     Level: L3-4, L4-5, L5-S1, and TBD Facets (L2, L3, L4, L5, S1, and TBD Medial Branch Nerves)     Sedation: Patient's choice.     Timeframe: Today    Where will this procedure be performed?:   [facility]   DG [facility] C-ARM 1-60 MIN NO REPORT    Intraoperative interpretation by procedural physician at [facility].    Standing Status:   Standing    Number of Occurrences:   1    Reason for exam::   Assistance in needle guidance and placement for procedures requiring needle placement in or near specific anatomical locations not easily accessible without such assistance.   Care order/instruction: Please confirm that the patient has stopped the Eliquis (Apixaban) x 3 days prior to procedure or surgery.    Please confirm that the patient has stopped the Eliquis (Apixaban) x 3 days prior to procedure or surgery.    Standing Status:   Standing    Number of Occurrences:   1   Provide equipment / supplies at bedside    Procedure tray: "Block Tray" (Disposable   single use) Skin infiltration needle: Regular 1.5-in, 25-G, (x1) Block Needle type: Spinal Amount/quantity: 4 Size: Regular (3.5-inch) Gauge: 22G    Standing Status:   Standing    Number of Occurrences:   1    Specify:   Block Tray   Informed Consent Details: Physician/Practitioner Attestation; Transcribe to consent form and obtain patient signature    Nursing Order: Transcribe to consent form and obtain patient signature. Note: Always confirm laterality of pain with Shannon [name], before procedure.    Physician/Practitioner attestation of informed consent for procedure/surgical case:   I, the physician/practitioner, attest that I have discussed with the patient the benefits, risks, side effects, alternatives, likelihood of achieving goals and potential problems during recovery for the procedure that I have provided informed consent.    Procedure:   Lumbar Facet Block  under fluoroscopic guidance    Physician/Practitioner performing the procedure:   Hadlea Furuya A. Laban Emperor MD    Indication/Reason:   Low Back Pain, with our without leg pain, due to Facet Joint Arthralgia (Joint Pain) Spondylosis (Arthritis of the Spine), without myelopathy or radiculopathy (Nerve Damage).   Saline lock IV    Have LR [id number] mL available and administer at 125 mL/hr if patient becomes hypotensive.  Standing Status:   Standing    Number of Occurrences:   1   Bleeding precautions    Standing Status:   Standing    Number of Occurrences:   1   Chronic Opioid Analgesic:   None MME/day: 0 mg/day   Medications ordered for procedure: Meds ordered this encounter  Medications   lidocaine (XYLOCAINE) 2 % (with pres) injection 400 mg   pentafluoroprop-tetrafluoroeth (GEBAUERS) aerosol   midazolam (VERSED) 5 MG/5ML injection 0.5-2 mg    Make sure Flumazenil is available in the pyxis when using this medication. If oversedation occurs, administer 0.2 mg IV over 15 sec. If after 45 sec no response, administer 0.2 mg  again over 1 min; may repeat at 1 min intervals; not to exceed 4 doses (1 mg)   fentaNYL (SUBLIMAZE) injection 25-50 mcg    Make sure Narcan is available in the pyxis when using this medication. In the event of respiratory depression (RR< 8/min): Titrate NARCAN (naloxone) in increments of 0.1 to 0.2 mg IV at 2-3 minute intervals, until desired degree of reversal.   ropivacaine (PF) 2 mg/mL (0.2%) (NAROPIN) injection 18 mL   triamcinolone acetonide (KENALOG-40) injection 80 mg   Medications administered: We administered lidocaine, midazolam, fentaNYL, ropivacaine (PF) 2 mg/mL (0.2%), and triamcinolone acetonide.  See the medical record for exact dosing, route, and time of administration.  Follow-up plan:   Return in about 2 weeks (around 06/23/2023) for (Face2F), (PPE).       Interventional Therapies  Risk Factors  Considerations  Medical Comorbidities:  Eliquis Anticoagulation: (Stop: 3 days  Restart: 6 hours)  A-Fib  BA  IDDM  liver cirrhosis     Planned  Pending:   See the above orders   Under consideration:   Pending   Completed:   None at this time   Therapeutic  Palliative (PRN) options:   None established   Completed by other providers:   Therapeutic left sacroiliac joint inj. x3 (03/29/2015, 04/16/2020, 09/08/2022) by Redge Gainer, DO ([facility])  Therapeutic knee injections by orthopedics Therapeutic shoulder injections by [facility]       Recent Visits Date Type Provider Dept  05/11/23 Office Visit Delano Metz, MD [facility]  04/20/23 Office Visit Delano Metz, MD [facility]  Showing recent visits within past 90 days and meeting all other requirements Today's Visits Date Type Provider Dept  06/09/23 Procedure visit Delano Metz, MD [facility]  Showing today's visits and meeting all other requirements Future Appointments Date Type Provider Dept  06/23/23 Appointment Delano Metz, MD [facility]  Showing future appointments within next 90 days and meeting all other requirements  Disposition: Discharge home  Discharge (Date  Time): 06/09/2023; 1007 hrs.   Primary Care Physician: Vernard Gambles, MD Location: [facility] Note by: Oswaldo Done, MD (TTS technology used. I apologize for any typographical errors that were not detected and corrected.) Date: 06/09/2023; Time: 10:57 AM  Disclaimer:  Medicine is not an Visual merchandiser. The only guarantee in medicine is that nothing is guaranteed. It is important to note that the decision to proceed with this intervention was based on the information collected from the patient. The Data and conclusions were drawn from the patient's questionnaire, the interview, and the physical examination. Because the information was provided in large part by the patient, it cannot be guaranteed that it has not been purposely or unconsciously manipulated. Every effort has been made  to obtain as much relevant data as possible for this evaluation. It is important to note that the conclusions that lead to this procedure are derived in large part from the available data. Always take into account that the treatment will also be dependent on availability of resources and existing treatment guidelines, considered by other Pain Management Practitioners as being common knowledge and practice, at the time of the intervention. For Medico-Legal purposes, it is also important to point out that variation in procedural techniques and pharmacological choices are the acceptable norm. The indications, contraindications, technique, and results of the above procedure should only be interpreted and judged by a Board-Certified Interventional Pain Specialist with extensive familiarity and expertise in the same exact procedure and technique.

## 2023-06-09 NOTE — Patient Instructions (Signed)
______________________________________________________________________    Post-Procedure Discharge Instructions  Instructions: Apply ice:  Purpose: This will minimize any swelling and discomfort after procedure.  When: Day of procedure, as soon as you get home. How: Fill a plastic sandwich bag with crushed ice. Cover it with a small towel and apply to injection site. How long: (15 min on, 15 min off) Apply for 15 minutes then remove x 15 minutes.  Repeat sequence on day of procedure, until you go to bed. Apply heat:  Purpose: To treat any soreness and discomfort from the procedure. When: Starting the next day after the procedure. How: Apply heat to procedure site starting the day following the procedure. How long: May continue to repeat daily, until discomfort goes away. Food intake: Start with clear liquids (like water) and advance to regular food, as tolerated.  Physical activities: Keep activities to a minimum for the first 8 hours after the procedure. After that, then as tolerated. Driving: If you have received any sedation, be responsible and do not drive. You are not allowed to drive for 24 hours after having sedation. Blood thinner: (Applies only to those taking blood thinners) You may restart your blood thinner 6 hours after your procedure. Insulin: (Applies only to Diabetic patients taking insulin) As soon as you can eat, you may resume your normal dosing schedule. Infection prevention: Keep procedure site clean and dry. Shower daily and clean area with soap and water. Post-procedure Pain Diary: Extremely important that this be done correctly and accurately. Recorded information will be used to determine the next step in treatment. For the purpose of accuracy, follow these rules: Evaluate only the area treated. Do not report or include pain from an untreated area. For the purpose of this evaluation, ignore all other areas of pain, except for the treated area. After your procedure,  avoid taking a long nap and attempting to complete the pain diary after you wake up. Instead, set your alarm clock to go off every hour, on the hour, for the initial 8 hours after the procedure. Document the duration of the numbing medicine, and the relief you are getting from it. Do not go to sleep and attempt to complete it later. It will not be accurate. If you received sedation, it is likely that you were given a medication that may cause amnesia. Because of this, completing the diary at a later time may cause the information to be inaccurate. This information is needed to plan your care. Follow-up appointment: Keep your post-procedure follow-up evaluation appointment after the procedure (usually 2 weeks for most procedures, 6 weeks for radiofrequencies). DO NOT FORGET to bring you pain diary with you.   Expect: (What should I expect to see with my procedure?) From numbing medicine (AKA: Local Anesthetics): Numbness or decrease in pain. You may also experience some weakness, which if present, could last for the duration of the local anesthetic. Onset: Full effect within 15 minutes of injected. Duration: It will depend on the type of local anesthetic used. On the average, 1 to 8 hours.  From steroids (Applies only if steroids were used): Decrease in swelling or inflammation. Once inflammation is improved, relief of the pain will follow. Onset of benefits: Depends on the amount of swelling present. The more swelling, the longer it will take for the benefits to be seen. In some cases, up to 10 days. Duration: Steroids will stay in the system x 2 weeks. Duration of benefits will depend on multiple posibilities including persistent irritating factors. Side-effects: If  present, they may typically last 2 weeks (the duration of the steroids). Frequent: Cramps (if they occur, drink Gatorade and take over-the-counter Magnesium 450-500 mg once to twice a day); water retention with temporary weight gain;  increases in blood sugar; decreased immune system response; increased appetite. Occasional: Facial flushing (red, warm cheeks); mood swings; menstrual changes. Uncommon: Long-term decrease or suppression of natural hormones; bone thinning. (These are more common with higher doses or more frequent use. This is why we prefer that our patients avoid having any injection therapies in other practices.)  Very Rare: Severe mood changes; psychosis; aseptic necrosis. From procedure: Some discomfort is to be expected once the numbing medicine wears off. This should be minimal if ice and heat are applied as instructed.  Call if: (When should I call?) You experience numbness and weakness that gets worse with time, as opposed to wearing off. New onset bowel or bladder incontinence. (Applies only to procedures done in the spine)  Emergency Numbers: Durning business hours (Monday - Thursday, 8:00 AM - 4:00 PM) (Friday, 9:00 AM - 12:00 Noon): [phone] After hours: [phone] NOTE: If you are having a problem and are unable connect with, or to talk to a provider, then go to your nearest urgent care or emergency department. If the problem is serious and urgent, please call 911. ______________________________________________________________________     ______________________________________________________________________    Blood Thinners  IMPORTANT NOTICE:  If you take any of these, make sure to notify the nursing staff.  Failure to do so may result in serious injury.  Recommended time intervals to stop and restart blood-thinners, before & after invasive procedures  Generic Name Brand Name Pre-procedure: Stop medication for this amount of time before your procedure: Post-procedure: Wait this amount of time after the procedure before restarting your medication:  Abciximab Reopro 15 days 2 hrs  Alteplase Activase 10 days 10 days  Anagrelide Agrylin    Apixaban Eliquis 3 days 6 hrs  Cilostazol  Pletal 3 days 5 hrs  Clopidogrel Plavix 7-10 days 2 hrs  Dabigatran Pradaxa 5 days 6 hrs  Dalteparin Fragmin 24 hours 4 hrs  Dipyridamole Aggrenox 11days 2 hrs  Edoxaban Lixiana; Savaysa 3 days 2 hrs  Enoxaparin  Lovenox 24 hours 4 hrs  Eptifibatide Integrillin 8 hours 2 hrs  Fondaparinux  Arixtra 72 hours 12 hrs  Hydroxychloroquine Plaquenil 11 days   Prasugrel Effient 7-10 days 6 hrs  Reteplase Retavase 10 days 10 days  Rivaroxaban Xarelto 3 days 6 hrs  Ticagrelor Brilinta 5-7 days 6 hrs  Ticlopidine Ticlid 10-14 days 2 hrs  Tinzaparin Innohep 24 hours 4 hrs  Tirofiban Aggrastat 8 hours 2 hrs  Warfarin Coumadin 5 days 2 hrs   Other medications with blood-thinning effects  Product indications Generic (Brand) names Note  Cholesterol Lipitor Stop 4 days before procedure  Blood thinner (injectable) Heparin (LMW or LMWH Heparin) Stop 24 hours before procedure  Cancer Ibrutinib (Imbruvica) Stop 7 days before procedure  Malaria/Rheumatoid Hydroxychloroquine (Plaquenil) Stop 11 days before procedure  Thrombolytics  10 days before or after procedures   Over-the-counter (OTC) Products with blood-thinning effects  Product Common names Stop Time  Aspirin > 325 mg Goody Powders, Excedrin, etc. 11 days  Aspirin <= 81 mg  7 days  Fish oil  4 days  Garlic supplements  7 days  Ginkgo biloba  36 hours  Ginseng  24 hours  NSAIDs Ibuprofen, Naprosyn, etc. 3 days  Vitamin E  4 days   ______________________________________________________________________

## 2023-06-10 ENCOUNTER — Telehealth: Payer: Self-pay

## 2023-06-10 NOTE — Telephone Encounter (Signed)
Post procedure follow up.  Patient states she is doing good.

## 2023-06-22 NOTE — Progress Notes (Signed)
PROVIDER NOTE: Information contained herein reflects review and annotations entered in association with encounter. Interpretation of such information and data should be left to medically-trained personnel. Information provided to patient can be located elsewhere in the medical record under "Patient Instructions". Document created using STT-dictation technology, any transcriptional errors that may result from process are unintentional.    Patient: Shannon [name]  Service Category: E/M  Provider: Oswaldo Done, MD  DOB: 03-Apr-1956  DOS: 06/23/2023  Referring Provider: Vernard Gambles, MD  MRN: [id number]  Specialty: Interventional Pain Management  PCP: Vernard Gambles, MD  Type: Established Patient  Setting: Ambulatory outpatient    Location: Office  Delivery: Face-to-face     HPI  [name], a 68 y.o. year old female, is here today because of her Chronic bilateral low back pain without sciatica [M54.50, G89.29]. [name] primary complain today is Back Pain (lower)  Pertinent problems: [name] has Steroid-induced diabetes (HCC); Systemic lupus erythematosus (HCC); Lumbosacral radiculopathy at L5; Osteoarthritis of knee; Sacroiliac joint pain; Arthritis; Chronic pain syndrome; Primary osteoarthritis of shoulders (Bilateral); Myalgia due to statin; Seronegative spondylitis (HCC); Abnormal MRI, lumbar spine (12/16/2022) [facility]); Lumbar Grade 1 Anterolisthesis of L3/L4 & L4/L5; Chronic lower extremity pain (2ry area of Pain) (Bilateral) (R>L); Chronic low back pain (1ry area of Pain) (Bilateral) w/ sciatica (Bilateral); Lower extremity weakness (Left); Lumbar facet joint pain; Chronic low back pain (Bilateral) w/o sciatica; Vertebrogenic low back pain (L5-S1); Lumbar facet hypertrophy; Lumbosacral facet hypertrophy; Lumbar spinal stenosis, w/o neurogenic claudication; Lumbar foraminal stenosis; and Spondylosis without myelopathy or radiculopathy, lumbosacral region on their pertinent problem  list. Pain Assessment: Severity of Chronic pain is reported as a 3 /10. Location: Back Left, Right/Denies. Onset: More than a month ago. Quality: Constant, Aching, Stabbing. Timing: Constant. Modifying factor(s): procedures. Vitals:  height is 5\' 5"  (1.651 m) and weight is 180 lb (81.6 kg). Her temperature is 97.3 F (36.3 C) (abnormal). Her blood pressure is 121/79 and her pulse is 70. Her oxygen saturation is 100%.  BMI: Estimated body mass index is 29.95 kg/m as calculated from the following:   Height as of this encounter: 5\' 5"  (1.651 m).   Weight as of this encounter: 180 lb (81.6 kg). Last encounter: 05/11/2023. Last procedure: 06/09/2023.  Reason for encounter: post-procedure evaluation and assessment.  Discussed the use of AI scribe software for clinical note transcription with the patient, who gave verbal consent to proceed.  History of Present Illness   Shannon [name] is a 68 year old female with autoimmune hepatitis who presents with low back pain.  She has been experiencing low back pain, having previously undergone a diagnostic bilateral lumbar facet block. On the day of the procedure, she experienced numbness and no pain for about three to four hours, followed by some relief with ice application. However, the pain returned after a few days, subsided again, and then reappeared. The pain has been high-level and constant for two days prior to the visit, located on both sides of her lower back. She feels more unstable in her back, often holding onto things while walking, and reports weakness in her back.  She also experiences intermittent left flank pain, described as muscle pulling or cramps, located around the T5, T6 region. The pain is sporadic, not daily, and does not radiate to the center or the back. She has been experiencing this pain for months, but it is not constant. No specific pain medication has been prescribed for this pain  due to concerns about liver metabolism and  sedative effects.  She has a history of autoimmune hepatitis and had a stent placed in her gallbladder about four to five months ago for drainage. Her gallbladder has not been removed due to concerns about her liver's ability to handle the surgery.  She was previously prescribed gabapentin many years ago but avoids medications that cause sleepiness due to her liver's inability to metabolize them effectively. She experiences excessive sleepiness, sleeping up to 12-15 hours a day, when taking such medications.  She recalls a fall in the past year where she stood up and had no strength in her back, resulting in a fall to the floor without injury. She uses a back brace but did not wear it for the visit to present in her most natural state.      Post-procedure evaluation   Type: Lumbar Facet, Medial Branch Block(s) (w/ fluoroscopic mapping) #1  Laterality: Bilateral  Left Levels: L2, L3, L4, L5, and S1 Medial Branch Level(s). Injecting these levels blocks the L3-4, L4-5, and L5-S1 lumbar facet joints. Right Levels: L3, L4, L5, and S1 Medial Branch Level(s). Injecting these levels blocks the L4-5, and L5-S1 lumbar facet joints. Imaging: Fluoroscopic guidance Spinal ([id number]) Anesthesia: Local anesthesia (1-2% Lidocaine) Anxiolysis: IV Versed 2.0 mg Sedation: Moderate Sedation Fentanyl 1 mL (50 mcg) DOS: 06/09/2023 Performed by: Oswaldo Done, MD  Primary Purpose: Diagnostic/Therapeutic Indications: Low back pain severe enough to impact quality of life or function. 1. Chronic low back pain (Bilateral) w/o sciatica   2. Lumbar Grade 1 Anterolisthesis of L3/L4 & L4/L5   3. Lumbar facet joint pain   4. Lumbar facet hypertrophy   5. Lumbosacral facet hypertrophy   6. Spondylosis without myelopathy or radiculopathy, lumbosacral region   7. Abnormal MRI, lumbar spine (12/16/2022) [facility])    Chronic anticoagulation (Eliquis)    Atrial fibrillation with RVR (HCC)    NAS-11 Pain score:    Pre-procedure: 6 /10   Post-procedure: 0-No pain/10     Effectiveness:  Initial hour after procedure: 100 %. Subsequent 4-6 hours post-procedure: 100 %. Analgesia past initial 6 hours: 75 % (for 3 to 4 days i was pain free). Ongoing improvement:  Analgesic: The patient indicates currently having an ongoing 75% improvement of her low back pain.  She also indicated having attained 100% relief of the pain for the duration of local anesthetic.  The patient also stated having enjoyed 3 to 4 days of complete relief of her pain.  This confirms the pain to be coming from the facet joints and it also indicates that there is an inflammatory process involved in sustaining this pain. Function: Shannon [name] reports improvement in function ROM: Shannon [name] reports improvement in ROM  Pharmacotherapy Assessment  Analgesic: No chronic opioid analgesics therapy prescribed by our practice. None MME/day: 0 mg/day   Monitoring: [place] PMP: PDMP reviewed during this encounter.       Pharmacotherapy: No side-effects or adverse reactions reported. Compliance: No problems identified. Effectiveness: Clinically acceptable.  No notes on file  No results found for: "CBDTHCR" No results found for: "D8THCCBX" No results found for: "D9THCCBX"  UDS:  Summary  Date Value Ref Range Status  04/20/2023 FINAL  Final    Comment:    ==================================================================== Compliance Drug Analysis, Ur ==================================================================== Test                             Result  Flag       Units  Drug Present and Declared for Prescription Verification   Acetaminophen                  PRESENT      EXPECTED  Drug Present not Declared for Prescription Verification   Dextromethorphan               PRESENT      UNEXPECTED   Dextrorphan/Levorphanol        PRESENT      UNEXPECTED    Dextrorphan is an expected metabolite of dextromethorphan, an over-     the-counter or prescription cough suppressant. Dextrorphan cannot be    distinguished from the scheduled prescription medication levorphanol    by the method used for analysis.  Drug Absent but Declared for Prescription Verification   Tramadol                       Not Detected UNEXPECTED ng/mg creat   Trazodone                      Not Detected UNEXPECTED   Lidocaine                      Not Detected UNEXPECTED    Lidocaine, as indicated in the declared medication list, is not    always detected even when used as directed.    Metoprolol                     Not Detected UNEXPECTED ==================================================================== Test                      Result    Flag   Units      Ref Range   Creatinine              150              mg/dL      >=30 ==================================================================== Declared Medications:  The flagging and interpretation on this report are based on the  following declared medications.  Unexpected results may arise from  inaccuracies in the declared medications.   **Note: The testing scope of this panel includes these medications:   Metoprolol  Tramadol (Ultram)  Trazodone (Desyrel)   **Note: The testing scope of this panel does not include small to  moderate amounts of these reported medications:   Acetaminophen (Tylenol)  Topical Lidocaine (Lidoderm)   **Note: The testing scope of this panel does not include the  following reported medications:   Albuterol (Ventolin HFA)  Apixaban (Eliquis)  Azathioprine (Imuran)  Celecoxib (Celebrex)  Empagliflozin (Jardiance)  Furosemide (Lasix)  Insulin Evaristo Bury)  Lactulose  Loratadine (Claritin)  Losartan (Cozaar)  Magnesium (Mag-Ox)  Metformin  Miconazole  Nystatin (Mycostatin)  Omeprazole (Prilosec)  Prednisone (Deltasone)  Simethicone  Spironolactone (Aldactone)  Vitamin B12  Vitamin  D3 ==================================================================== For clinical consultation, please call [phone]. ====================================================================       ROS  Constitutional: Denies any fever or chills Gastrointestinal: No reported hemesis, hematochezia, vomiting, or acute GI distress Musculoskeletal: Denies any acute onset joint swelling, redness, loss of ROM, or weakness Neurological: No reported episodes of acute onset apraxia, aphasia, dysarthria, agnosia, amnesia, paralysis, loss of coordination, or loss of consciousness  Medication Review  Magnesium Oxide -Mg Supplement, ONE TOUCH ULTRA SYSTEM KIT, Pen Needles, Simethicone, Vitamin D3, acetaminophen, albuterol, apixaban, azaTHIOprine, blood glucose meter  kit and supplies, celecoxib, cyanocobalamin, furosemide, glucose blood, insulin degludec, lactulose, lidocaine, loratadine, losartan, metFORMIN, metoprolol tartrate, miconazole, nystatin, omeprazole, spironolactone, traMADol, and traZODone  History Review  Allergy: Shannon [name] is allergic to hydrocodone, lipitor [atorvastatin calcium], milk-related compounds, oxycodone, wellbutrin [bupropion], and adhesive [tape]. Drug: Shannon [name]  reports no history of drug use. Alcohol:  reports no history of alcohol use. Tobacco:  reports that she has been smoking cigarettes. She has a 15 pack-year smoking history. She has never used smokeless tobacco. Social: Shannon [name]  reports that she has been smoking cigarettes. She has a 15 pack-year smoking history. She has never used smokeless tobacco. She reports that she does not drink alcohol and does not use drugs. Medical:  has a past medical history of Abdominal bloating (08/16/2013), Adjustment disorder (11/04/2010), Asthma, Atrial fibrillation with RVR (HCC), Autoimmune hepatitis (HCC), Bradycardia, Depression, Diabetes mellitus without complication (HCC), Dilated cardiomyopathy (HCC), DUB  (dysfunctional uterine bleeding), Elevated liver enzymes (04/11/2020), Exposure to TB (2005), Fatigue (01/23/2015), Gallstones, Gastric ulcer, GERD (gastroesophageal reflux disease), HFrEF (heart failure with reduced ejection fraction) (HCC), History of asthma (02/26/2018), History of chicken pox, migraines, Leukopenia (04/11/2020), Liver disease, Motion sickness, Osteoarthritis, Systemic lupus erythematosus (HCC) (05/31/2010), Thoracic radiculitis (03/05/2012), and Vitamin D deficiency. Surgical: Shannon [name]  has a past surgical history that includes Laparoscopic hysterectomy; Tubal ligation; Back surgery; Abdominal hysterectomy; Hallux valgus austin (Left, 02/09/2019); and Hammer toe surgery (Left, 02/09/2019). Family: family history includes Alcohol abuse in her brother; Cancer in her mother; Diabetes in her sister, sister, and son; Heart disease in her brother and father; Hypertension in her father, sister, sister, and son; Kidney disease in her sister; Rheum arthritis in her sister; Sarcoidosis in her sister.  Laboratory Chemistry Profile   Renal Lab Results  Component Value Date   BUN 13 04/20/2023   CREATININE 0.88 04/20/2023   BCR 15 04/20/2023   GFR 90.32 12/18/2021    Hepatic Lab Results  Component Value Date   AST 29 04/20/2023   ALT 29 12/18/2021   ALBUMIN 3.9 04/20/2023   ALKPHOS 131 (H) 04/20/2023   LIPASE 31.0 01/23/2015    Electrolytes Lab Results  Component Value Date   NA 144 04/20/2023   K 4.7 04/20/2023   CL 106 04/20/2023   CALCIUM 9.2 04/20/2023   MG 1.8 04/20/2023    Bone Lab Results  Component Value Date   VD25OH 65.28 12/18/2021   25OHVITD1 40 04/20/2023   25OHVITD2 1.1 04/20/2023   25OHVITD3 39 04/20/2023    Inflammation (CRP: Acute Phase) (ESR: Chronic Phase) Lab Results  Component Value Date   CRP <1 04/20/2023   ESRSEDRATE 58 (H) 04/20/2023         Note: Above Lab results reviewed.  Recent Imaging Review  DG PAIN CLINIC C-ARM 1-60 MIN NO  REPORT Fluoro was used, but no Radiologist interpretation will be provided.  Please refer to "NOTES" tab for provider progress note. Note: Reviewed        Physical Exam  General appearance: Well nourished, well developed, and well hydrated. In no apparent acute distress Mental status: Alert, oriented x 3 (person, place, & time)       Respiratory: No evidence of acute respiratory distress Eyes: PERLA Vitals: BP 121/79   Pulse 70   Temp (!) 97.3 F (36.3 C)   Ht 5\' 5"  (1.651 m)   Wt 180 lb (81.6 kg)   SpO2 100%   BMI 29.95 kg/m  BMI: Estimated body mass index is 29.95 kg/m as  calculated from the following:   Height as of this encounter: 5\' 5"  (1.651 m).   Weight as of this encounter: 180 lb (81.6 kg). Ideal: Ideal body weight: 57 kg (125 lb 10.6 oz) Adjusted ideal body weight: 66.9 kg (147 lb 6.4 oz)  Assessment   Diagnosis Status  1. Chronic low back pain (Bilateral) w/o sciatica   2. Lumbar facet joint pain   3. Lumbar Grade 1 Anterolisthesis of L3/L4 & L4/L5   4. Lumbar facet hypertrophy   5. Spondylosis without myelopathy or radiculopathy, lumbosacral region   6. Lumbosacral facet hypertrophy   7. Abnormal MRI, lumbar spine (12/16/2022) ([facility])   8. Postop check   9. Chronic anticoagulation (Eliquis)    Improved Improving Stable   Updated Problems: Problem  Primary osteoarthritis of shoulders (Bilateral)  Seronegative Spondylitis (Hcc)  Systemic Lupus Erythematosus (Hcc)  Dilated Cardiomyopathy (Hcc)  Lower extremity edema (Bilateral) (Resolved)    Plan of Care  Problem-specific:  Assessment and Plan    Lumbar Facet Joint Arthritis Chronic low back pain is attributed to lumbar facet joint arthritis, confirmed by diagnostic bilateral lumbar facet block. Temporary relief from numbing medicine and delayed relief from steroids suggest an inflammatory component. The condition is expected to worsen with age, influenced by mechanical stress and weight. Risks and  benefits of repeating injections and potential future radiofrequency ablation, which can provide relief for 3-18 months but may need repetition as nerves regrow, were discussed. Emphasis was placed on weight management and proper lifting techniques to prevent exacerbation. Plan includes scheduling a second lumbar facet joint injection, stopping Eliquis 3 days prior, and providing sedation instructions (no food or drink 6-8 hours prior, arrange for a driver). An anti-inflammatory diet and information on over-the-counter supplements for inflammation and muscle spasms were discussed. Proper lifting techniques, use of a back brace during pain-triggering activities, and encouragement of stretching and strengthening exercises were advised.  Intermittent Left Flank Pain Intermittent left flank pain may be due to muscle spasms or referred pain from a liver condition. The pain is sporadic. Low-dose gabapentin or pregabalin for nerve pain management was discussed, with emphasis on low doses due to the liver condition. These medications can raise the threshold for nerve firing, reducing pain frequency and intensity. Consideration of low-dose gabapentin or pregabalin, instructions on dose adjustment using capsules dissolved in water, recommendation of Gatorade Zero for electrolyte replacement, and advice on taking a multivitamin daily were provided.  Autoimmune Hepatitis This chronic condition is managed with a gallbladder stent for drainage. Gallbladder removal is not an option due to high surgical risk associated with liver disease. The high surgical risk and need for liver support were discussed. Current management and monitoring will continue.  General Health Maintenance General health maintenance focuses on managing chronic pain and inflammation. Information on an anti-inflammatory diet, recommendation of over-the-counter supplements for inflammation and muscle spasms, and encouragement of regular physical  activity and exercises were provided.  Follow-up A follow-up will be scheduled after the second lumbar facet joint injection. Response to new medications, if prescribed, will be monitored. Pain levels and functional status will be reassessed at the next visit.       Ms. Shannon SCHLOTTMAN has a current medication list which includes the following long-term medication(s): albuterol, apixaban, furosemide, loratadine, metformin, metoprolol tartrate, omeprazole, simethicone, and spironolactone.  Pharmacotherapy (Medications Ordered): No orders of the defined types were placed in this encounter.  Orders:  Orders Placed This Encounter  Procedures   LUMBAR FACET(MEDIAL BRANCH NERVE  BLOCK) MBNB    Diagnosis: Lumbar Facet Syndrome (M47.816); Lumbosacral Facet Syndrome (M47.817); Lumbar Facet Joint Pain (M54.59) Medical Necessity Statement: 1.Severe chronic axial low back pain causing functional impairment documented by ongoing pain scale assessments. 2.Pain present for longer than 3 months (Chronic) documented to have failed noninvasive conservative therapies. 3.Absence of untreated radiculopathy. 4.There is no radiological evidence of untreated fractures, tumor, infection, or deformity.  Physical Examination Findings: Positive Kemp Maneuver: (Y)  Positive Lumbar Hyperextension-Rotation provocative test: (Y)    Standing Status:   Future    Expiration Date:   09/20/2023    Scheduling Instructions:     Procedure: Lumbar facet Block     Type: Medial Branch Block     Side: Bilateral     Purpose: Diagnostic/Therapeutic     Level(s): L3-4, L4-5, L5-S1, and TBD by Fluoroscopic Mapping Facets (L2, L3, L4, L5, S1, and TBD Medial Branch)     Sedation: With Sedation.     Timeframe: ASAA    Where will this procedure be performed?:   [facility]   Nursing Instructions:    Please complete this patient's postprocedure evaluation.    Scheduling Instructions:     Please complete this patient's  postprocedure evaluation.   Blood Thinner Instructions to Nursing    Always make sure patient has clearance from prescribing physician to stop blood thinners for interventional therapies. If the patient requires a Lovenox-bridge therapy, make sure arrangements are made to institute it with the assistance of the PCP.    Scheduling Instructions:     Have Shannon [name] stop the Eliquis (Apixaban) x 3 days prior to procedure or surgery.   Follow-up plan:   Return for (ECT): (B) L-FCT Blk #2, (Blood Thinner Protocol).      Interventional Therapies  Risk Factors  Considerations  Medical Comorbidities:  Eliquis Anticoagulation: (Stop: 3 days  Restart: 6 hours)  A-Fib  BA  IDDM w/ hx. Coma  steroid-induced MD  Autoimmune hepatitis  liver cirrhosis  Hx. Portal Encephalopathy  Portal-HTN  GERD  Esophageal varices  Hx. Adrenal insuficiency  SLE (Lupus)  dilated cardiomyopathy  Thrombocytopoenia  BA     Planned  Pending:   Diagnostic bilateral lumbar facet (L2-S1) MBB #2    Under consideration:   Therapeutic bilateral lumbar facet RFA #1  Therapeutic L5-S1 Intracept procedure    Completed:   Diagnostic bilateral lumbar facet (L2-S1) MBB x1 (06/09/2023) (100/100/80/75)    Therapeutic  Palliative (PRN) options:   None established   Completed by other providers:   Therapeutic left sacroiliac joint inj. x3 (03/29/2015, 04/16/2020, 09/08/2022) by Redge Gainer, DO ([facility])  Therapeutic knee injections by orthopedics Therapeutic shoulder injections by [facility]     Recent Visits Date Type Provider Dept  06/09/23 Procedure visit Delano Metz, MD [facility]  05/11/23 Office Visit Delano Metz, MD [facility]  04/20/23 Office Visit Delano Metz, MD [facility]  Showing recent visits within past 90 days and meeting all other requirements Today's Visits Date Type Provider Dept  06/23/23 Office Visit Delano Metz, MD [facility]  Showing today's visits and meeting all other requirements Future Appointments No visits were found meeting these conditions. Showing future appointments within next 90 days and meeting all other requirements  I discussed the assessment and treatment plan with the patient. The patient was provided an opportunity to ask questions and all were answered. The patient agreed with the plan and demonstrated an understanding of  the instructions.  Patient advised to call back or seek an in-person evaluation if the symptoms or condition worsens.  Duration of encounter: 40 minutes.  Total time on encounter, as per AMA guidelines included both the face-to-face and non-face-to-face time personally spent by the physician and/or other qualified health care professional(s) on the day of the encounter (includes time in activities that require the physician or other qualified health care professional and does not include time in activities normally performed by clinical staff). Physician's time may include the following activities when performed: Preparing to see the patient (e.g., pre-charting review of records, searching for previously ordered imaging, lab work, and nerve conduction tests) Review of prior analgesic pharmacotherapies. Reviewing PMP Interpreting ordered tests (e.g., lab work, imaging, nerve conduction tests) Performing post-procedure evaluations, including interpretation of diagnostic procedures Obtaining and/or reviewing separately obtained history Performing a medically appropriate examination and/or evaluation Counseling and educating the patient/family/caregiver Ordering medications, tests, or procedures Referring and communicating with other health care professionals (when not separately reported) Documenting clinical information in the electronic or other health record Independently interpreting results (not separately reported) and communicating  results to the patient/ family/caregiver Care coordination (not separately reported)  Note by: Oswaldo Done, MD Date: 06/23/2023; Time: 4:34 PM

## 2023-06-23 ENCOUNTER — Encounter: Payer: Self-pay | Admitting: Pain Medicine

## 2023-06-23 ENCOUNTER — Ambulatory Visit: Payer: Medicare HMO | Attending: Pain Medicine | Admitting: Pain Medicine

## 2023-06-23 VITALS — BP 121/79 | HR 70 | Temp 97.3°F | Ht 65.0 in | Wt 180.0 lb

## 2023-06-23 DIAGNOSIS — G8929 Other chronic pain: Secondary | ICD-10-CM | POA: Insufficient documentation

## 2023-06-23 DIAGNOSIS — M5459 Other low back pain: Secondary | ICD-10-CM | POA: Diagnosis not present

## 2023-06-23 DIAGNOSIS — M4316 Spondylolisthesis, lumbar region: Secondary | ICD-10-CM

## 2023-06-23 DIAGNOSIS — M47817 Spondylosis without myelopathy or radiculopathy, lumbosacral region: Secondary | ICD-10-CM

## 2023-06-23 DIAGNOSIS — M545 Low back pain, unspecified: Secondary | ICD-10-CM

## 2023-06-23 DIAGNOSIS — Z7901 Long term (current) use of anticoagulants: Secondary | ICD-10-CM | POA: Diagnosis present

## 2023-06-23 DIAGNOSIS — M47816 Spondylosis without myelopathy or radiculopathy, lumbar region: Secondary | ICD-10-CM | POA: Diagnosis not present

## 2023-06-23 DIAGNOSIS — R937 Abnormal findings on diagnostic imaging of other parts of musculoskeletal system: Secondary | ICD-10-CM | POA: Diagnosis present

## 2023-06-23 DIAGNOSIS — Z09 Encounter for follow-up examination after completed treatment for conditions other than malignant neoplasm: Secondary | ICD-10-CM

## 2023-06-23 NOTE — Patient Instructions (Addendum)
______________________________________________________________________    Preparing for your procedure  Appointments: If you think you may not be able to keep your appointment, call 24-48 hours in advance to cancel. We need time to make it available to others.  Procedure visits are for procedures only. During your procedure appointment there will be: NO Prescription Refills*. NO medication changes or discussions*. NO discussion of disability issues*. NO unrelated pain problem evaluations*. NO evaluations to order other pain procedures*. *These will be addressed at a separate and distinct evaluation encounter on the provider's evaluation schedule and not during procedure days.  Instructions: Food intake: Avoid eating anything solid for at least 8 hours prior to your procedure. Clear liquid intake: You may take clear liquids such as water up to 2 hours prior to your procedure. (No carbonated drinks. No soda.) Transportation: Unless otherwise stated by your physician, bring a driver. (Driver cannot be a Market researcher, Pharmacist, community, or any other form of public transportation.) Morning Medicines: Except for blood thinners, take all of your other morning medications with a sip of water. Make sure to take your heart and blood pressure medicines. If your blood pressure's lower number is above 100, the case will be rescheduled. Blood thinners: Make sure to stop your blood thinners as instructed.  If you take a blood thinner, but were not instructed to stop it, call our office [phone] and ask to talk to a nurse. Not stopping a blood thinner prior to certain procedures could lead to serious complications. Diabetics on insulin: Notify the staff so that you can be scheduled 1st case in the morning. If your diabetes requires high dose insulin, take only  of your normal insulin dose the morning of the procedure and notify the staff that you have done so. Preventing infections: Shower with an antibacterial soap the  morning of your procedure.  Build-up your immune system: Take 1000 mg of Vitamin C with every meal (3 times a day) the day prior to your procedure. Antibiotics: Inform the nursing staff if you are taking any antibiotics or if you have any conditions that may require antibiotics prior to procedures. (Example: recent joint implants)   Pregnancy: If you are pregnant make sure to notify the nursing staff. Not doing so may result in injury to the fetus, including death.  Sickness: If you have a cold, fever, or any active infections, call and cancel or reschedule your procedure. Receiving steroids while having an infection may result in complications. Arrival: You must be in the facility at least 30 minutes prior to your scheduled procedure. Tardiness: Your scheduled time is also the cutoff time. If you do not arrive at least 15 minutes prior to your procedure, you will be rescheduled.  Children: Do not bring any children with you. Make arrangements to keep them home. Dress appropriately: There is always a possibility that your clothing may get soiled. Avoid long dresses. Valuables: Do not bring any jewelry or valuables.  Reasons to call and reschedule or cancel your procedure: (Following these recommendations will minimize the risk of a serious complication.) Surgeries: Avoid having procedures within 2 weeks of any surgery. (Avoid for 2 weeks before or after any surgery). Flu Shots: Avoid having procedures within 2 weeks of a flu shots or . (Avoid for 2 weeks before or after immunizations). Barium: Avoid having a procedure within 7-10 days after having had a radiological study involving the use of radiological contrast. (Myelograms, Barium swallow or enema study). Heart attacks: Avoid any elective procedures or surgeries for the  initial 6 months after a "Myocardial Infarction" (Heart Attack). Blood thinners: It is imperative that you stop these medications before procedures. Let us know if you if you take  any blood thinner.  Infection: Avoid procedures during or within two weeks of an infection (including chest colds or gastrointestinal problems). Symptoms associated with infections include: Localized redness, fever, chills, night sweats or profuse sweating, burning sensation when voiding, cough, congestion, stuffiness, runny nose, sore throat, diarrhea, nausea, vomiting, cold or Flu symptoms, recent or current infections. It is specially important if the infection is over the area that we intend to treat. Heart and lung problems: Symptoms that may suggest an active cardiopulmonary problem include: cough, chest pain, breathing difficulties or shortness of breath, dizziness, ankle swelling, uncontrolled high or unusually low blood pressure, and/or palpitations. If you are experiencing any of these symptoms, cancel your procedure and contact your primary care physician for an evaluation.  Remember:  Regular Business hours are:  Monday to Thursday 8:00 AM to 4:00 PM  Provider's Schedule: Delano Metz, MD:  Procedure days: Tuesday and Thursday 7:30 AM to 4:00 PM  Edward Jolly, MD:  Procedure days: Monday and Wednesday 7:30 AM to 4:00 PM Last  Updated: 04/21/2023 ______________________________________________________________________      ______________________________________________________________________    Muscle Spasms & Cramps  Cause(s):  Most common - vitamin and/or electrolyte (calcium, potassium, sodium, etc.) deficiencies. Post procedure - steroids (injected, oral, or inhaled) can make your kidneys excrete (loose) electrolytes. Most of the time this will not cause any symptoms however, if you happen to be borderline low on your electrolytes, it may temporarily triggering cramps & spasms.  Possible triggers: Sweating - causes loss of electrolytes thru the skin. Steroids - causes loss of electrolytes thru the urine.  Treatment: (over-the-counter)  Gatorade (or any other  electrolyte-replenishing drink) - Take 1, 8 oz glass with each meal (3 times a day). Mechanism of action: Replenishes lost electrolytes. Magnesium 400 to 500 mg - Take 1 tablet twice a day (one with breakfast and one at bedtime). If you have kidney disease talk to your primary care physician before taking any Magnesium. Mechanism of action: Magnesium is a natural muscle relaxant. Tonic Water with quinine - Take 1, 8 oz glass before bedtime.  Mechanism of action: Quinine is used to treat spasms.  Last Update: 11/20/2022  ______________________________________________________________________     For a more information on how diet can influence inflammation, please visit: [email or web address] NOTE: We are not associated or profit from this web site.    ______________________________________________________________________    OTC Supplements:   The following is a list of over-the-counter (OTC) supplements that have been found to have [employer]) studies suggesting that they may be of some benefits when used in moderation in some chronic pain-related conditions.  NOTE:  Always consult with your primary care provider and/or pharmacist before taking any OTC medications to make sure they will not interact with your current medications. Always use manufacturer's recommended dosage.  Supplement Possible benefit May be of benefit in treatment of   Turmeric/curcumin anti-inflammatory Joint and muscle aches and pain.  Glucosamine/chondroitin (triple strength) may slow loss of articular cartilage Joint pain.  Vitamin D-3* may suppress release of chemicals associated with inflammation. Increases tolerance to pain. Joint and muscle aches and pain.   Moringa(+) anti-inflammatory with mild analgesic effects Joint and muscle aches and pain.  Melatonin(+) Helps reset sleep cycle. Insomnia.  Vitamin B-12* may help keep nerves and blood cells healthy as well  as  maintaining function of nervous system Nerve pain (Burning pain)  Alpha-Lipoic-Acid (ALA)* antioxidant that may help with nerve health, pain, and blocking the activation of some inflammatory chemicals Diabetic neuropathy and metabolic syndrome  superoxide dismutase (SOD)** Currently being reviewed.   Tiger Balm Currently being reviewed.   hydrolyzed collagen peptides* Currently being reviewed.  Collagen supplementation may increases bone strength, density, and mass; may improve joint stiffness/mobility, and functionality; and may reduce joint pain. Possible chondroprotective effects. May help with protection of joint health.   Methylsulfonylmethane (MSM)* Currently being reviewed.   CBD(+) Currently being reviewed.   Delta-8 THC(+) Currently being reviewed.   *  Generally Recognized As Safe (GRAS) approved substance.-FDA ([employer]) ** "Possibly Safe", but not considered Generally Recognized As Safe (Not GRAS) by the [place] (FDA) as a food additive. (+) Not considered Generally Recognized As Safe (Not GRAS) by the [employer] (FDA) as a food additive.  ______________________________________________________________________

## 2023-07-06 NOTE — Progress Notes (Unsigned)
 PROVIDER NOTE: Interpretation of information contained herein should be left to medically-trained personnel. Specific patient instructions are provided elsewhere under "Patient Instructions" section of medical record. This document was created in part using STT-dictation technology, any transcriptional errors that may result from this process are unintentional.  Patient: Shannon [name] Type: Established DOB: [birth year] MRN: [id number] PCP: Vernard Gambles, MD  Service: Procedure DOS: 07/07/2023 Setting: Ambulatory Location: Ambulatory outpatient facility Delivery: Face-to-face Provider: Oswaldo Done, MD Specialty: Interventional Pain Management Specialty designation: 09 Location: Outpatient facility Ref. Prov.: Delano Metz, MD       Interventional Therapy   Type: Lumbar Facet, Medial Branch Block(s) (w/ fluoroscopic mapping) #2  Laterality: Bilateral  Level: L2, L3, L4, L5, and S1 Medial Branch Level(s). Injecting these levels blocks the L3-4, L4-5, and L5-S1 lumbar facet joints. Imaging: Fluoroscopic guidance Spinal ([id number]) Anesthesia: Local anesthesia (1-2% Lidocaine) Anxiolysis: IV Versed 2.0 mg Sedation: Moderate Sedation Fentanyl 1 mL (50 mcg) DOS: 07/07/2023 Performed by: Oswaldo Done, MD  Primary Purpose: Diagnostic/Therapeutic Indications: Low back pain severe enough to impact quality of life or function. 1. Chronic low back pain (Bilateral) w/o sciatica   2. Lumbar facet joint pain   3. Lumbar facet hypertrophy   4. Lumbar Grade 1 Anterolisthesis of L3/L4 & L4/L5   5. Lumbosacral facet hypertrophy   6. Spondylosis without myelopathy or radiculopathy, lumbosacral region   7. Vertebrogenic low back pain (L5-S1)    Steroid-induced diabetes mellitus, sequela (HCC)    Chronic anticoagulation (Eliquis)    Cirrhosis of liver without ascites, unspecified hepatic cirrhosis type (HCC)    Portal hypertension (HCC)    NAS-11 Pain score:   Pre-procedure: 4  /10   Post-procedure: 0-No pain/10     Position / Prep / Materials:  Position: Prone  Prep solution: ChloraPrep (2% chlorhexidine gluconate and 70% isopropyl alcohol) Area Prepped: Posterolateral Lumbosacral Spine (Wide prep: From the lower border of the scapula down to the end of the tailbone and from flank to flank.)  Materials:  Tray: Block Needle(s):  Type: Spinal  Gauge (G): 22  Length: 5-in Qty: 4     H&P (Pre-op Assessment):  Shannon [name] is a 68 y.o. (year old), female patient, seen today for interventional treatment. She  has a past surgical history that includes Laparoscopic hysterectomy; Tubal ligation; Back surgery; Abdominal hysterectomy; Hallux valgus austin (Left, 02/09/2019); and Hammer toe surgery (Left, 02/09/2019). Shannon [name] has a current medication list which includes the following prescription(s): acetaminophen, albuterol, apixaban, azathioprine, blood glucose meter kit and supplies, blood glucose meter kit and supplies, one touch ultra system kit, celecoxib, vitamin d3, furosemide, onetouch verio, tresiba flextouch, pen needles, lactulose, lidocaine, loratadine, losartan, magnesium oxide -mg supplement, metformin, metoprolol tartrate, miconazole 3, nystatin, omeprazole, simethicone, spironolactone, tramadol, trazodone, and cyanocobalamin, and the following Facility-Administered Medications: fentanyl and pentafluoroprop-tetrafluoroeth. Her primarily concern today is the Back Pain (low)  Initial Vital Signs:  Pulse/HCG Rate: 72ECG Heart Rate: 76 (nsr) Temp: (!) 97.2 F (36.2 C) Resp: 18 BP: (!) 138/93 SpO2: 100 %  BMI: Estimated body mass index is 29.29 kg/m as calculated from the following:   Height as of this encounter: 5\' 5"  (1.651 m).   Weight as of this encounter: 176 lb (79.8 kg).  Risk Assessment: Allergies: Reviewed. She is allergic to hydrocodone, lipitor [atorvastatin calcium], milk-related compounds, oxycodone, wellbutrin [bupropion], and adhesive  [tape].  Allergy Precautions: None required Coagulopathies: Reviewed. None identified.  Blood-thinner therapy: None at this time Active Infection(s): Reviewed. None identified. Ms.  [name] is afebrile  Site Confirmation: Shannon [name] was asked to confirm the procedure and laterality before marking the site Procedure checklist: Completed Consent: Before the procedure and under the influence of no sedative(s), amnesic(s), or anxiolytics, the patient was informed of the treatment options, risks and possible complications. To fulfill our ethical and legal obligations, as recommended by the American Medical Association's Code of Ethics, I have informed the patient of my clinical impression; the nature and purpose of the treatment or procedure; the risks, benefits, and possible complications of the intervention; the alternatives, including doing nothing; the risk(s) and benefit(s) of the alternative treatment(s) or procedure(s); and the risk(s) and benefit(s) of doing nothing. The patient was provided information about the general risks and possible complications associated with the procedure. These may include, but are not limited to: failure to achieve desired goals, infection, bleeding, organ or nerve damage, allergic reactions, paralysis, and death. In addition, the patient was informed of those risks and complications associated to Spine-related procedures, such as failure to decrease pain; infection (i.e.: Meningitis, epidural or intraspinal abscess); bleeding (i.e.: epidural hematoma, subarachnoid hemorrhage, or any other type of intraspinal or peri-dural bleeding); organ or nerve damage (i.e.: Any type of peripheral nerve, nerve root, or spinal cord injury) with subsequent damage to sensory, motor, and/or autonomic systems, resulting in permanent pain, numbness, and/or weakness of one or several areas of the body; allergic reactions; (i.e.: anaphylactic reaction); and/or death. Furthermore, the patient  was informed of those risks and complications associated with the medications. These include, but are not limited to: allergic reactions (i.e.: anaphylactic or anaphylactoid reaction(s)); adrenal axis suppression; blood sugar elevation that in diabetics may result in ketoacidosis or comma; water retention that in patients with history of congestive heart failure may result in shortness of breath, pulmonary edema, and decompensation with resultant heart failure; weight gain; swelling or edema; medication-induced neural toxicity; particulate matter embolism and blood vessel occlusion with resultant organ, and/or nervous system infarction; and/or aseptic necrosis of one or more joints. Finally, the patient was informed that Medicine is not an exact science; therefore, there is also the possibility of unforeseen or unpredictable risks and/or possible complications that may result in a catastrophic outcome. The patient indicated having understood very clearly. We have given the patient no guarantees and we have made no promises. Enough time was given to the patient to ask questions, all of which were answered to the patient's satisfaction. Ms. Bernard has indicated that she wanted to continue with the procedure. Attestation: I, the ordering provider, attest that I have discussed with the patient the benefits, risks, side-effects, alternatives, likelihood of achieving goals, and potential problems during recovery for the procedure that I have provided informed consent. Date  Time: 07/07/2023  8:19 AM  Pre-Procedure Preparation:  Monitoring: As per clinic protocol. Respiration, ETCO2, SpO2, BP, heart rate and rhythm monitor placed and checked for adequate function Safety Precautions: Patient was assessed for positional comfort and pressure points before starting the procedure. Time-out: I initiated and conducted the "Time-out" before starting the procedure, as per protocol. The patient was asked to participate by  confirming the accuracy of the "Time Out" information. Verification of the correct person, site, and procedure were performed and confirmed by me, the nursing staff, and the patient. "Time-out" conducted as per Joint Commission's Universal Protocol (UP.01.01.01). Time: 0900 Start Time: 0900 hrs.  Description of Procedure:          Laterality: (see above) Targeted Levels: (see above)  Safety Precautions: Aspiration  looking for blood return was conducted prior to all injections. At no point did we inject any substances, as a needle was being advanced. Before injecting, the patient was told to immediately notify me if she was experiencing any new onset of "ringing in the ears, or metallic taste in the mouth". No attempts were made at seeking any paresthesias. Safe injection practices and needle disposal techniques used. Medications properly checked for expiration dates. SDV (single dose vial) medications used. After the completion of the procedure, all disposable equipment used was discarded in the proper designated medical waste containers. Local Anesthesia: Protocol guidelines were followed. The patient was positioned over the fluoroscopy table. The area was prepped in the usual manner. The time-out was completed. The target area was identified using fluoroscopy. A 12-in long, straight, sterile hemostat was used with fluoroscopic guidance to locate the targets for each level blocked. Once located, the skin was marked with an approved surgical skin marker. Once all sites were marked, the skin (epidermis, dermis, and hypodermis), as well as deeper tissues (fat, connective tissue and muscle) were infiltrated with a small amount of a short-acting local anesthetic, loaded on a 10cc syringe with a 25G, 1.5-in  Needle. An appropriate amount of time was allowed for local anesthetics to take effect before proceeding to the next step. Local Anesthetic: Lidocaine 2.0% The unused portion of the local anesthetic was  discarded in the proper designated containers. Technical description of process:  L2 Medial Branch Nerve Block (MBB): The target area for the L2 medial branch is at the junction of the postero-lateral aspect of the superior articular process and the superior, posterior, and medial edge of the transverse process of L3. Under fluoroscopic guidance, a Quincke needle was inserted until contact was made with os over the superior postero-lateral aspect of the pedicular shadow (target area). After negative aspiration for blood, 0.5 mL of the nerve block solution was injected without difficulty or complication. The needle was removed intact. L3 Medial Branch Nerve Block (MBB): The target area for the L3 medial branch is at the junction of the postero-lateral aspect of the superior articular process and the superior, posterior, and medial edge of the transverse process of L4. Under fluoroscopic guidance, a Quincke needle was inserted until contact was made with os over the superior postero-lateral aspect of the pedicular shadow (target area). After negative aspiration for blood, 0.5 mL of the nerve block solution was injected without difficulty or complication. The needle was removed intact. L4 Medial Branch Nerve Block (MBB): The target area for the L4 medial branch is at the junction of the postero-lateral aspect of the superior articular process and the superior, posterior, and medial edge of the transverse process of L5. Under fluoroscopic guidance, a Quincke needle was inserted until contact was made with os over the superior postero-lateral aspect of the pedicular shadow (target area). After negative aspiration for blood, 0.5 mL of the nerve block solution was injected without difficulty or complication. The needle was removed intact. L5 Medial Branch Nerve Block (MBB): The target area for the L5 medial branch is at the junction of the postero-lateral aspect of the superior articular process and the superior,  posterior, and medial edge of the sacral ala. Under fluoroscopic guidance, a Quincke needle was inserted until contact was made with os over the superior postero-lateral aspect of the pedicular shadow (target area). After negative aspiration for blood, 0.5 mL of the nerve block solution was injected without difficulty or complication. The needle was removed intact.  S1 Medial Branch Nerve Block (MBB): The target area for the S1 medial branch is at the posterior and inferior 6 o'clock position of the L5-S1 facet joint. Under fluoroscopic guidance, the Quincke needle inserted for the L5 MBB was redirected until contact was made with os over the inferior and postero aspect of the sacrum, at the 6 o' clock position under the L5-S1 facet joint (Target area). After negative aspiration for blood, 0.5 mL of the nerve block solution was injected without difficulty or complication. The needle was removed intact.  Once the entire procedure was completed, the treated area was cleaned, making sure to leave some of the prepping solution back to take advantage of its long term bactericidal properties.         Illustration of the posterior view of the lumbar spine and the posterior neural structures. Laminae of L2 through S1 are labeled. DPRL5, dorsal primary ramus of L5; DPRS1, dorsal primary ramus of S1; DPR3, dorsal primary ramus of L3; FJ, facet (zygapophyseal) joint L3-L4; I, inferior articular process of L4; LB1, lateral branch of dorsal primary ramus of L1; IAB, inferior articular branches from L3 medial branch (supplies L4-L5 facet joint); IBP, intermediate branch plexus; MB3, medial branch of dorsal primary ramus of L3; NR3, third lumbar nerve root; S, superior articular process of L5; SAB, superior articular branches from L4 (supplies L4-5 facet joint also); TP3, transverse process of L3.   Facet Joint Innervation (* possible contribution)  L1-2 T12, L1 (L2*)  Medial Branch  L2-3 L1, L2 (L3*)         "           "  L3-4 L2, L3 (L4*)         "          "  L4-5 L3, L4 (L5*)         "          "  L5-S1 L4, L5, S1          "          "    Vitals:   07/07/23 0818 07/07/23 0858 07/07/23 0903 07/07/23 0909  BP: (!) 138/93 (!) 128/100 (!) 123/106 (!) 114/93  Pulse: 72     Resp:  18 16 17   Temp: (!) 97.2 F (36.2 C)     SpO2: 100% 100% 100% 100%  Weight: 176 lb (79.8 kg)     Height: 5\' 5"  (1.651 m)        End Time: 0908 hrs.  Imaging Guidance (Spinal):          Type of Imaging Technique: Fluoroscopy Guidance (Spinal) Indication(s): Fluoroscopy guidance for needle placement to enhance accuracy in procedures requiring precise needle localization for targeted delivery of medication in or near specific anatomical locations not easily accessible without such real-time imaging assistance. Exposure Time: Please see nurses notes. Contrast: None used. Fluoroscopic Guidance: I was personally present during the use of fluoroscopy. "Tunnel Vision Technique" used to obtain the best possible view of the target area. Parallax error corrected before commencing the procedure. "Direction-depth-direction" technique used to introduce the needle under continuous pulsed fluoroscopy. Once target was reached, antero-posterior, oblique, and lateral fluoroscopic projection used confirm needle placement in all planes. Images permanently stored in EMR. Interpretation: No contrast injected. I personally interpreted the imaging intraoperatively. Adequate needle placement confirmed in multiple planes. Permanent images saved into the patient's record.  Post-operative Assessment:  Post-procedure Vital Signs:  Pulse/HCG Rate: 7275 (nsr) Temp: Marland Kitchen)  97.2 F (36.2 C) Resp: 17 BP: (!) 114/93 SpO2: 100 %  EBL: None  Complications: No immediate post-treatment complications observed by team, or reported by patient.  Note: The patient tolerated the entire procedure well. A repeat set of vitals were taken after the procedure and the  patient was kept under observation following institutional policy, for this type of procedure. Post-procedural neurological assessment was performed, showing return to baseline, prior to discharge. The patient was provided with post-procedure discharge instructions, including a section on how to identify potential problems. Should any problems arise concerning this procedure, the patient was given instructions to immediately contact us, at any time, without hesitation. In any case, we plan to contact the patient by telephone for a follow-up status report regarding this interventional procedure.  Comments:  No additional relevant information.  Plan of Care (POC)  Orders:  Orders Placed This Encounter  Procedures   LUMBAR FACET(MEDIAL BRANCH NERVE BLOCK) MBNB    Scheduling Instructions:     Procedure: Lumbar facet block (AKA.: Lumbosacral medial branch nerve block)     Side: Bilateral     Level: L3-4, L4-5, L5-S1, and TBD Facets (L2, L3, L4, L5, S1, and TBD Medial Branch Nerves)     Sedation: Patient's choice.     Timeframe: Today    Where will this procedure be performed?:   [facility]   DG [facility] C-ARM 1-60 MIN NO REPORT    Intraoperative interpretation by procedural physician at [facility].    Standing Status:   Standing    Number of Occurrences:   1    Reason for exam::   Assistance in needle guidance and placement for procedures requiring needle placement in or near specific anatomical locations not easily accessible without such assistance.   Informed Consent Details: Physician/Practitioner Attestation; Transcribe to consent form and obtain patient signature    Nursing Order: Transcribe to consent form and obtain patient signature. Note: Always confirm laterality of pain with Ms. Nazareno, before procedure.    Physician/Practitioner attestation of informed consent for procedure/surgical case:   I, the physician/practitioner, attest that I have discussed with the  patient the benefits, risks, side effects, alternatives, likelihood of achieving goals and potential problems during recovery for the procedure that I have provided informed consent.    Procedure:   Lumbar Facet Block  under fluoroscopic guidance    Physician/Practitioner performing the procedure:   Ilda Laskin A. Laban Emperor MD    Indication/Reason:   Low Back Pain, with our without leg pain, due to Facet Joint Arthralgia (Joint Pain) Spondylosis (Arthritis of the Spine), without myelopathy or radiculopathy (Nerve Damage).   Care order/instruction: Please confirm that the patient has stopped the Eliquis (Apixaban) x 3 days prior to procedure or surgery.    Please confirm that the patient has stopped the Eliquis (Apixaban) x 3 days prior to procedure or surgery.    Standing Status:   Standing    Number of Occurrences:   1   Provide equipment / supplies at bedside    Procedure tray: "Block Tray" (Disposable  single use) Skin infiltration needle: Regular 1.5-in, 25-G, (x1) Block Needle type: Spinal Amount/quantity: 4 Size: Medium (5-inch) Gauge: 22G    Standing Status:   Standing    Number of Occurrences:   1    Specify:   Block Tray   Saline lock IV    Have LR [phone] mL available and administer at 125 mL/hr if patient becomes hypotensive.    Standing Status:  Standing    Number of Occurrences:   1   Bleeding precautions    Standing Status:   Standing    Number of Occurrences:   1   Chronic Opioid Analgesic:  No chronic opioid analgesics therapy prescribed by our practice. None MME/day: 0 mg/day   Medications ordered for procedure: Meds ordered this encounter  Medications   lidocaine (XYLOCAINE) 2 % (with pres) injection 400 mg   pentafluoroprop-tetrafluoroeth (GEBAUERS) aerosol   midazolam (VERSED) 5 MG/5ML injection 0.5-2 mg    Make sure Flumazenil is available in the pyxis when using this medication. If oversedation occurs, administer 0.2 mg IV over 15 sec. If after 45 sec no  response, administer 0.2 mg again over 1 min; may repeat at 1 min intervals; not to exceed 4 doses (1 mg)   fentaNYL (SUBLIMAZE) injection 25-50 mcg    Make sure Narcan is available in the pyxis when using this medication. In the event of respiratory depression (RR< 8/min): Titrate NARCAN (naloxone) in increments of 0.1 to 0.2 mg IV at 2-3 minute intervals, until desired degree of reversal.   ropivacaine (PF) 2 mg/mL (0.2%) (NAROPIN) injection 18 mL   triamcinolone acetonide (KENALOG-40) injection 80 mg   Medications administered: We administered lidocaine, midazolam, fentaNYL, ropivacaine (PF) 2 mg/mL (0.2%), and triamcinolone acetonide.  See the medical record for exact dosing, route, and time of administration.  Follow-up plan:   Return in about 2 weeks (around 07/21/2023) for (Face2F), (PPE).       Interventional Therapies  Risk Factors  Considerations  Medical Comorbidities:  Eliquis Anticoagulation: (Stop: 3 days  Restart: 6 hours)  A-Fib  BA  IDDM w/ hx. Coma  steroid-induced MD  Autoimmune hepatitis  liver cirrhosis  Hx. Portal Encephalopathy  Portal-HTN  GERD  Esophageal varices  Hx. Adrenal insuficiency  SLE (Lupus)  dilated cardiomyopathy  Thrombocytopoenia  BA     Planned  Pending:   Diagnostic bilateral lumbar facet (L2-S1) MBB #2    Under consideration:   Therapeutic bilateral lumbar facet RFA #1  Therapeutic L5-S1 Intracept procedure    Completed:   Diagnostic bilateral lumbar facet (L2-S1) MBB x1 (06/09/2023) (100/100/80/75)    Therapeutic  Palliative (PRN) options:   None established   Completed by other providers:   Therapeutic left sacroiliac joint inj. x3 (03/29/2015, 04/16/2020, 09/08/2022) by Redge Gainer, DO ([facility])  Therapeutic knee injections by orthopedics Therapeutic shoulder injections by [facility]     Recent Visits Date Type Provider Dept  06/23/23 Office Visit Delano Metz, MD [facility]  06/09/23 Procedure visit Delano Metz, MD [facility]  05/11/23 Office Visit Delano Metz, MD [facility]  04/20/23 Office Visit Delano Metz, MD [facility]  Showing recent visits within past 90 days and meeting all other requirements Today's Visits Date Type Provider Dept  07/07/23 Procedure visit Delano Metz, MD [facility]  Showing today's visits and meeting all other requirements Future Appointments No visits were found meeting these conditions. Showing future appointments within next 90 days and meeting all other requirements  Disposition: Discharge home  Discharge (Date  Time): 07/07/2023;   hrs.   Primary Care Physician: Vernard Gambles, MD Location: [facility] Note by: Oswaldo Done, MD (TTS technology used. I apologize for any typographical errors that were not detected and corrected.) Date: 07/07/2023; Time: 9:13 AM  Disclaimer:  Medicine is not an Visual merchandiser. The only guarantee in medicine is  that nothing is guaranteed. It is important to note that the decision to proceed with this intervention was based on the information collected from the patient. The Data and conclusions were drawn from the patient's questionnaire, the interview, and the physical examination. Because the information was provided in large part by the patient, it cannot be guaranteed that it has not been purposely or unconsciously manipulated. Every effort has been made to obtain as much relevant data as possible for this evaluation. It is important to note that the conclusions that lead to this procedure are derived in large part from the available data. Always take into account that the treatment will also be dependent on availability of resources and existing treatment guidelines, considered by other Pain Management Practitioners as being common knowledge and practice, at the time of the intervention. For  Medico-Legal purposes, it is also important to point out that variation in procedural techniques and pharmacological choices are the acceptable norm. The indications, contraindications, technique, and results of the above procedure should only be interpreted and judged by a Board-Certified Interventional Pain Specialist with extensive familiarity and expertise in the same exact procedure and technique.

## 2023-07-07 ENCOUNTER — Ambulatory Visit
Admission: RE | Admit: 2023-07-07 | Discharge: 2023-07-07 | Disposition: A | Discharge status: Home / Self Care | Payer: Medicare HMO | Source: Ambulatory Visit | Attending: Pain Medicine | Admitting: Pain Medicine

## 2023-07-07 ENCOUNTER — Ambulatory Visit: Payer: Medicare HMO | Attending: Pain Medicine | Admitting: Pain Medicine

## 2023-07-07 ENCOUNTER — Encounter: Payer: Self-pay | Admitting: Pain Medicine

## 2023-07-07 VITALS — BP 122/89 | HR 72 | Temp 97.2°F | Resp 15 | Ht 65.0 in | Wt 176.0 lb

## 2023-07-07 DIAGNOSIS — T380X5S Adverse effect of glucocorticoids and synthetic analogues, sequela: Secondary | ICD-10-CM | POA: Diagnosis present

## 2023-07-07 DIAGNOSIS — M47816 Spondylosis without myelopathy or radiculopathy, lumbar region: Secondary | ICD-10-CM | POA: Insufficient documentation

## 2023-07-07 DIAGNOSIS — M47817 Spondylosis without myelopathy or radiculopathy, lumbosacral region: Secondary | ICD-10-CM | POA: Insufficient documentation

## 2023-07-07 DIAGNOSIS — K7460 Unspecified cirrhosis of liver: Secondary | ICD-10-CM | POA: Insufficient documentation

## 2023-07-07 DIAGNOSIS — E099 Drug or chemical induced diabetes mellitus without complications: Secondary | ICD-10-CM | POA: Diagnosis present

## 2023-07-07 DIAGNOSIS — Z7901 Long term (current) use of anticoagulants: Secondary | ICD-10-CM | POA: Diagnosis present

## 2023-07-07 DIAGNOSIS — M4316 Spondylolisthesis, lumbar region: Secondary | ICD-10-CM | POA: Diagnosis present

## 2023-07-07 DIAGNOSIS — M5459 Other low back pain: Secondary | ICD-10-CM | POA: Insufficient documentation

## 2023-07-07 DIAGNOSIS — M545 Low back pain, unspecified: Secondary | ICD-10-CM | POA: Insufficient documentation

## 2023-07-07 DIAGNOSIS — K766 Portal hypertension: Secondary | ICD-10-CM | POA: Insufficient documentation

## 2023-07-07 DIAGNOSIS — R937 Abnormal findings on diagnostic imaging of other parts of musculoskeletal system: Secondary | ICD-10-CM | POA: Insufficient documentation

## 2023-07-07 DIAGNOSIS — M5451 Vertebrogenic low back pain: Secondary | ICD-10-CM | POA: Diagnosis present

## 2023-07-07 DIAGNOSIS — G8929 Other chronic pain: Secondary | ICD-10-CM | POA: Diagnosis present

## 2023-07-07 MED ORDER — MIDAZOLAM HCL 5 MG/5ML IJ SOLN
0.5000 mg | Freq: Once | INTRAMUSCULAR | Status: AC
  Administered 2023-07-07: 2 mg via INTRAVENOUS

## 2023-07-07 MED ORDER — TRIAMCINOLONE ACETONIDE 40 MG/ML IJ SUSP
80.0000 mg | Freq: Once | INTRAMUSCULAR | Status: AC
Start: 2023-07-07 — End: 2023-07-07
  Administered 2023-07-07: 80 mg

## 2023-07-07 MED ORDER — ROPIVACAINE HCL 2 MG/ML IJ SOLN
INTRAMUSCULAR | Status: AC
  Filled 2023-07-07: qty 20

## 2023-07-07 MED ORDER — MIDAZOLAM HCL 5 MG/5ML IJ SOLN
INTRAMUSCULAR | Status: AC
  Filled 2023-07-07: qty 5

## 2023-07-07 MED ORDER — ROPIVACAINE HCL 2 MG/ML IJ SOLN
18.0000 mL | Freq: Once | INTRAMUSCULAR | Status: AC
Start: 2023-07-07 — End: 2023-07-07
  Administered 2023-07-07: 18 mL via PERINEURAL

## 2023-07-07 MED ORDER — LIDOCAINE HCL 2 % IJ SOLN
INTRAMUSCULAR | Status: AC
Start: 2023-07-07 — End: ?
  Filled 2023-07-07: qty 20

## 2023-07-07 MED ORDER — FENTANYL CITRATE (PF) 100 MCG/2ML IJ SOLN
INTRAMUSCULAR | Status: AC
  Filled 2023-07-07: qty 2

## 2023-07-07 MED ORDER — PENTAFLUOROPROP-TETRAFLUOROETH EX AERO: INHALATION_SPRAY | Freq: Once | CUTANEOUS | Status: DC

## 2023-07-07 MED ORDER — LIDOCAINE HCL 2 % IJ SOLN
20.0000 mL | Freq: Once | INTRAMUSCULAR | Status: AC
Start: 2023-07-07 — End: 2023-07-07
  Administered 2023-07-07: 400 mg

## 2023-07-07 MED ORDER — TRIAMCINOLONE ACETONIDE 40 MG/ML IJ SUSP
INTRAMUSCULAR | Status: AC
  Filled 2023-07-07: qty 2

## 2023-07-07 MED ORDER — FENTANYL CITRATE (PF) 100 MCG/2ML IJ SOLN
25.0000 ug | INTRAMUSCULAR | Status: DC | PRN
  Administered 2023-07-07: 50 ug via INTRAVENOUS

## 2023-07-07 NOTE — Patient Instructions (Addendum)
 ______________________________________________________________________    Blood Thinners  IMPORTANT NOTICE:  If you take any of these, make sure to notify the nursing staff.  Failure to do so may result in serious injury.  Recommended time intervals to stop and restart blood-thinners, before & after invasive procedures  Generic Name Brand Name Pre-procedure: Stop medication for this amount of time before your procedure: Post-procedure: Wait this amount of time after the procedure before restarting your medication:  Abciximab Reopro 15 days 2 hrs  Alteplase Activase 10 days 10 days  Anagrelide Agrylin    Apixaban Eliquis 3 days 6 hrs  Cilostazol Pletal 3 days 5 hrs  Clopidogrel Plavix 7-10 days 2 hrs  Dabigatran Pradaxa 5 days 6 hrs  Dalteparin Fragmin 24 hours 4 hrs  Dipyridamole Aggrenox 11days 2 hrs  Edoxaban Lixiana; Savaysa 3 days 2 hrs  Enoxaparin  Lovenox 24 hours 4 hrs  Eptifibatide Integrillin 8 hours 2 hrs  Fondaparinux  Arixtra 72 hours 12 hrs  Hydroxychloroquine Plaquenil 11 days   Prasugrel Effient 7-10 days 6 hrs  Reteplase Retavase 10 days 10 days  Rivaroxaban Xarelto 3 days 6 hrs  Ticagrelor Brilinta 5-7 days 6 hrs  Ticlopidine Ticlid 10-14 days 2 hrs  Tinzaparin Innohep 24 hours 4 hrs  Tirofiban Aggrastat 8 hours 2 hrs  Warfarin Coumadin 5 days 2 hrs   Other medications with blood-thinning effects  Product indications Generic (Brand) names Note  Cholesterol Lipitor Stop 4 days before procedure  Blood thinner (injectable) Heparin (LMW or LMWH Heparin) Stop 24 hours before procedure  Cancer Ibrutinib (Imbruvica) Stop 7 days before procedure  Malaria/Rheumatoid Hydroxychloroquine (Plaquenil) Stop 11 days before procedure  Thrombolytics  10 days before or after procedures   Over-the-counter (OTC) Products with blood-thinning effects  Product Common names Stop Time  Aspirin > 325 mg Goody Powders, Excedrin, etc. 11 days  Aspirin <= 81 mg  7 days  Fish oil   4 days  Garlic supplements  7 days  Ginkgo biloba  36 hours  Ginseng  24 hours  NSAIDs Ibuprofen, Naprosyn, etc. 3 days  Vitamin E  4 days   ______________________________________________________________________     ______________________________________________________________________    Post-Procedure Discharge Instructions  Instructions: Apply ice:  Purpose: This will minimize any swelling and discomfort after procedure.  When: Day of procedure, as soon as you get home. How: Fill a plastic sandwich bag with crushed ice. Cover it with a small towel and apply to injection site. How long: (15 min on, 15 min off) Apply for 15 minutes then remove x 15 minutes.  Repeat sequence on day of procedure, until you go to bed. Apply heat:  Purpose: To treat any soreness and discomfort from the procedure. When: Starting the next day after the procedure. How: Apply heat to procedure site starting the day following the procedure. How long: May continue to repeat daily, until discomfort goes away. Food intake: Start with clear liquids (like water) and advance to regular food, as tolerated.  Physical activities: Keep activities to a minimum for the first 8 hours after the procedure. After that, then as tolerated. Driving: If you have received any sedation, be responsible and do not drive. You are not allowed to drive for 24 hours after having sedation. Blood thinner: (Applies only to those taking blood thinners) You may restart your blood thinner 6 hours after your procedure. Insulin: (Applies only to Diabetic patients taking insulin) As soon as you can eat, you may resume your normal dosing schedule. Infection prevention: Keep  procedure site clean and dry. Shower daily and clean area with soap and water. Post-procedure Pain Diary: Extremely important that this be done correctly and accurately. Recorded information will be used to determine the next step in treatment. For the purpose of accuracy,  follow these rules: Evaluate only the area treated. Do not report or include pain from an untreated area. For the purpose of this evaluation, ignore all other areas of pain, except for the treated area. After your procedure, avoid taking a long nap and attempting to complete the pain diary after you wake up. Instead, set your alarm clock to go off every hour, on the hour, for the initial 8 hours after the procedure. Document the duration of the numbing medicine, and the relief you are getting from it. Do not go to sleep and attempt to complete it later. It will not be accurate. If you received sedation, it is likely that you were given a medication that may cause amnesia. Because of this, completing the diary at a later time may cause the information to be inaccurate. This information is needed to plan your care. Follow-up appointment: Keep your post-procedure follow-up evaluation appointment after the procedure (usually 2 weeks for most procedures, 6 weeks for radiofrequencies). DO NOT FORGET to bring you pain diary with you.   Expect: (What should I expect to see with my procedure?) From numbing medicine (AKA: Local Anesthetics): Numbness or decrease in pain. You may also experience some weakness, which if present, could last for the duration of the local anesthetic. Onset: Full effect within 15 minutes of injected. Duration: It will depend on the type of local anesthetic used. On the average, 1 to 8 hours.  From steroids (Applies only if steroids were used): Decrease in swelling or inflammation. Once inflammation is improved, relief of the pain will follow. Onset of benefits: Depends on the amount of swelling present. The more swelling, the longer it will take for the benefits to be seen. In some cases, up to 10 days. Duration: Steroids will stay in the system x 2 weeks. Duration of benefits will depend on multiple posibilities including persistent irritating factors. Side-effects: If present, they  may typically last 2 weeks (the duration of the steroids). Frequent: Cramps (if they occur, drink Gatorade and take over-the-counter Magnesium 450-500 mg once to twice a day); water retention with temporary weight gain; increases in blood sugar; decreased immune system response; increased appetite. Occasional: Facial flushing (red, warm cheeks); mood swings; menstrual changes. Uncommon: Long-term decrease or suppression of natural hormones; bone thinning. (These are more common with higher doses or more frequent use. This is why we prefer that our patients avoid having any injection therapies in other practices.)  Very Rare: Severe mood changes; psychosis; aseptic necrosis. From procedure: Some discomfort is to be expected once the numbing medicine wears off. This should be minimal if ice and heat are applied as instructed.  Call if: (When should I call?) You experience numbness and weakness that gets worse with time, as opposed to wearing off. New onset bowel or bladder incontinence. (Applies only to procedures done in the spine)  Emergency Numbers: Durning business hours (Monday - Thursday, 8:00 AM - 4:00 PM) (Friday, 9:00 AM - 12:00 Noon): [phone] After hours: [phone] NOTE: If you are having a problem and are unable connect with, or to talk to a provider, then go to your nearest urgent care or emergency department. If the problem is serious and urgent, please call 911. ______________________________________________________________________

## 2023-07-08 ENCOUNTER — Telehealth: Payer: Self-pay | Admitting: *Deleted

## 2023-07-14 DIAGNOSIS — I5022 Chronic systolic (congestive) heart failure: Secondary | ICD-10-CM | POA: Insufficient documentation

## 2023-07-14 DIAGNOSIS — E611 Iron deficiency: Secondary | ICD-10-CM | POA: Insufficient documentation

## 2023-07-19 NOTE — Progress Notes (Unsigned)
 PROVIDER NOTE: Information contained herein reflects review and annotations entered in association with encounter. Interpretation of such information and data should be left to medically-trained personnel. Information provided to patient can be located elsewhere in the medical record under "Patient Instructions". Document created using STT-dictation technology, any transcriptional errors that may result from process are unintentional.    Patient: Shannon [name]  Service Category: E/M  Provider: Oswaldo Done, MD  DOB: 13-Aug-1955  DOS: 07/21/2023  Referring Provider: Vernard Gambles, MD  MRN: [id number]  Specialty: Interventional Pain Management  PCP: Vernard Gambles, MD  Type: Established Patient  Setting: Ambulatory outpatient    Location: Office  Delivery: Face-to-face     HPI  Shannon [name], a 68 y.o. year old female, is here today because of her No primary diagnosis found.. [name] primary complain today is No chief complaint on file.  Pertinent problems: Shannon [name] has Steroid-induced diabetes (HCC); Systemic lupus erythematosus (HCC); Lumbosacral radiculopathy at L5; Osteoarthritis of knee; Sacroiliac joint pain; Arthritis; Chronic pain syndrome; Primary osteoarthritis of shoulders (Bilateral); Myalgia due to statin; Seronegative spondylitis (HCC); Abnormal MRI, lumbar spine (12/16/2022) [facility]); Lumbar Grade 1 Anterolisthesis of L3/L4 & L4/L5; Chronic lower extremity pain (2ry area of Pain) (Bilateral) (R>L); Chronic low back pain (1ry area of Pain) (Bilateral) w/ sciatica (Bilateral); Lower extremity weakness (Left); Lumbar facet joint pain; Chronic low back pain (Bilateral) w/o sciatica; Vertebrogenic low back pain (L5-S1); Lumbar facet hypertrophy; Lumbosacral facet hypertrophy; Lumbar spinal stenosis, w/o neurogenic claudication; Lumbar foraminal stenosis; and Spondylosis without myelopathy or radiculopathy, lumbosacral region on their pertinent problem list. Pain Assessment:  Severity of   is reported as a  /10. Location:    / . Onset:  . Quality:  . Timing:  . Modifying factor(s):  Marland Kitchen Vitals:  vitals were not taken for this visit.  BMI: Estimated body mass index is 29.29 kg/m as calculated from the following:   Height as of 07/07/23: 5\' 5"  (1.651 m).   Weight as of 07/07/23: 176 lb (79.8 kg). Last encounter: 06/23/2023. Last procedure: 07/07/2023.  Reason for encounter: post-procedure evaluation and assessment. ***  Discussed the use of AI scribe software for clinical note transcription with the patient, who gave verbal consent to proceed.  History of Present Illness          Post-procedure evaluation   Type: Lumbar Facet, Medial Branch Block(s) (w/ fluoroscopic mapping) #2  Laterality: Bilateral  Level: L2, L3, L4, L5, and S1 Medial Branch Level(s). Injecting these levels blocks the L3-4, L4-5, and L5-S1 lumbar facet joints. Imaging: Fluoroscopic guidance Spinal ([id number]) Anesthesia: Local anesthesia (1-2% Lidocaine) Anxiolysis: IV Versed 2.0 mg Sedation: Moderate Sedation Fentanyl 1 mL (50 mcg) DOS: 07/07/2023 Performed by: Oswaldo Done, MD  Primary Purpose: Diagnostic/Therapeutic Indications: Low back pain severe enough to impact quality of life or function. 1. Chronic low back pain (Bilateral) w/o sciatica   2. Lumbar facet joint pain   3. Lumbar facet hypertrophy   4. Lumbar Grade 1 Anterolisthesis of L3/L4 & L4/L5   5. Lumbosacral facet hypertrophy   6. Spondylosis without myelopathy or radiculopathy, lumbosacral region   7. Vertebrogenic low back pain (L5-S1)    Steroid-induced diabetes mellitus, sequela (HCC)    Chronic anticoagulation (Eliquis)    Cirrhosis of liver without ascites, unspecified hepatic cirrhosis type (HCC)    Portal hypertension (HCC)    NAS-11 Pain score:   Pre-procedure: 4 /10   Post-procedure: 0-No pain/10     Effectiveness:  Initial hour after procedure:   ***. Subsequent 4-6 hours post-procedure:    ***. Analgesia past initial 6 hours:   ***. Ongoing improvement:  Analgesic:  *** Function:    ***    ROM:    ***      Pharmacotherapy Assessment  Analgesic: No chronic opioid analgesics therapy prescribed by our practice. None MME/day: 0 mg/day   Monitoring: [place] PMP: PDMP reviewed during this encounter.       Pharmacotherapy: No side-effects or adverse reactions reported. Compliance: No problems identified. Effectiveness: Clinically acceptable.  No notes on file  No results found for: "CBDTHCR" No results found for: "D8THCCBX" No results found for: "D9THCCBX"  UDS:  Summary  Date Value Ref Range Status  04/20/2023 FINAL  Final    Comment:    ==================================================================== Compliance Drug Analysis, Ur ==================================================================== Test                             Result       Flag       Units  Drug Present and Declared for Prescription Verification   Acetaminophen                  PRESENT      EXPECTED  Drug Present not Declared for Prescription Verification   Dextromethorphan               PRESENT      UNEXPECTED   Dextrorphan/Levorphanol        PRESENT      UNEXPECTED    Dextrorphan is an expected metabolite of dextromethorphan, an over-    the-counter or prescription cough suppressant. Dextrorphan cannot be    distinguished from the scheduled prescription medication levorphanol    by the method used for analysis.  Drug Absent but Declared for Prescription Verification   Tramadol                       Not Detected UNEXPECTED ng/mg creat   Trazodone                      Not Detected UNEXPECTED   Lidocaine                      Not Detected UNEXPECTED    Lidocaine, as indicated in the declared medication list, is not    always detected even when used as directed.    Metoprolol                     Not Detected  UNEXPECTED ==================================================================== Test                      Result    Flag   Units      Ref Range   Creatinine              150              mg/dL      >=86 ==================================================================== Declared Medications:  The flagging and interpretation on this report are based on the  following declared medications.  Unexpected results may arise from  inaccuracies in the declared medications.   **Note: The testing scope of this panel includes these medications:   Metoprolol  Tramadol (Ultram)  Trazodone (Desyrel)   **Note: The testing scope of this panel does not include small to  moderate amounts  of these reported medications:   Acetaminophen (Tylenol)  Topical Lidocaine (Lidoderm)   **Note: The testing scope of this panel does not include the  following reported medications:   Albuterol (Ventolin HFA)  Apixaban (Eliquis)  Azathioprine (Imuran)  Celecoxib (Celebrex)  Empagliflozin (Jardiance)  Furosemide (Lasix)  Insulin Evaristo Bury)  Lactulose  Loratadine (Claritin)  Losartan (Cozaar)  Magnesium (Mag-Ox)  Metformin  Miconazole  Nystatin (Mycostatin)  Omeprazole (Prilosec)  Prednisone (Deltasone)  Simethicone  Spironolactone (Aldactone)  Vitamin B12  Vitamin D3 ==================================================================== For clinical consultation, please call [phone]. ====================================================================       ROS  Constitutional: Denies any fever or chills Gastrointestinal: No reported hemesis, hematochezia, vomiting, or acute GI distress Musculoskeletal: Denies any acute onset joint swelling, redness, loss of ROM, or weakness Neurological: No reported episodes of acute onset apraxia, aphasia, dysarthria, agnosia, amnesia, paralysis, loss of coordination, or loss of consciousness  Medication Review  Magnesium Oxide -Mg Supplement, ONE TOUCH  ULTRA SYSTEM KIT, Pen Needles, Simethicone, Vitamin D3, acetaminophen, albuterol, apixaban, azaTHIOprine, blood glucose meter kit and supplies, celecoxib, cyanocobalamin, furosemide, glucose blood, insulin degludec, lactulose, lidocaine, loratadine, losartan, metFORMIN, metoprolol tartrate, miconazole, nystatin, omeprazole, spironolactone, traMADol, and traZODone  History Review  Allergy: Shannon [name] is allergic to hydrocodone, lipitor [atorvastatin calcium], milk-related compounds, oxycodone, wellbutrin [bupropion], and adhesive [tape]. Drug: Shannon [name]  reports no history of drug use. Alcohol:  reports no history of alcohol use. Tobacco:  reports that she has quit smoking. Her smoking use included cigarettes. She has a 15 pack-year smoking history. She has never used smokeless tobacco. Social: Shannon [name]  reports that she has quit smoking. Her smoking use included cigarettes. She has a 15 pack-year smoking history. She has never used smokeless tobacco. She reports that she does not drink alcohol and does not use drugs. Medical:  has a past medical history of Abdominal bloating (08/16/2013), Adjustment disorder (11/04/2010), Asthma, Atrial fibrillation with RVR (HCC), Autoimmune hepatitis (HCC), Bradycardia, Depression, Diabetes mellitus without complication (HCC), Dilated cardiomyopathy (HCC), DUB (dysfunctional uterine bleeding), Elevated liver enzymes (04/11/2020), Exposure to TB (2005), Fatigue (01/23/2015), Gallstones, Gastric ulcer, GERD (gastroesophageal reflux disease), HFrEF (heart failure with reduced ejection fraction) (HCC), History of asthma (02/26/2018), History of chicken pox, migraines, Leukopenia (04/11/2020), Liver disease, Motion sickness, Osteoarthritis, Systemic lupus erythematosus (HCC) (05/31/2010), Thoracic radiculitis (03/05/2012), and Vitamin D deficiency. Surgical: Shannon [name]  has a past surgical history that includes Laparoscopic hysterectomy; Tubal ligation; Back surgery;  Abdominal hysterectomy; Hallux valgus austin (Left, 02/09/2019); and Hammer toe surgery (Left, 02/09/2019). Family: family history includes Alcohol abuse in her brother; Cancer in her mother; Diabetes in her sister, sister, and son; Heart disease in her brother and father; Hypertension in her father, sister, sister, and son; Kidney disease in her sister; Rheum arthritis in her sister; Sarcoidosis in her sister.  Laboratory Chemistry Profile   Renal Lab Results  Component Value Date   BUN 13 04/20/2023   CREATININE 0.88 04/20/2023   BCR 15 04/20/2023   GFR 90.32 12/18/2021    Hepatic Lab Results  Component Value Date   AST 29 04/20/2023   ALT 29 12/18/2021   ALBUMIN 3.9 04/20/2023   ALKPHOS 131 (H) 04/20/2023   LIPASE 31.0 01/23/2015    Electrolytes Lab Results  Component Value Date   NA 144 04/20/2023   K 4.7 04/20/2023   CL 106 04/20/2023   CALCIUM 9.2 04/20/2023   MG 1.8 04/20/2023    Bone Lab Results  Component Value Date   VD25OH 65.28 12/18/2021  25OHVITD1 40 04/20/2023   25OHVITD2 1.1 04/20/2023   25OHVITD3 39 04/20/2023    Inflammation (CRP: Acute Phase) (ESR: Chronic Phase) Lab Results  Component Value Date   CRP <1 04/20/2023   ESRSEDRATE 58 (H) 04/20/2023         Note: Above Lab results reviewed.  Recent Imaging Review  DG PAIN CLINIC C-ARM 1-60 MIN NO REPORT Fluoro was used, but no Radiologist interpretation will be provided.  Please refer to "NOTES" tab for provider progress note. Note: Reviewed        Physical Exam  General appearance: Well nourished, well developed, and well hydrated. In no apparent acute distress Mental status: Alert, oriented x 3 (person, place, & time)       Respiratory: No evidence of acute respiratory distress Eyes: PERLA Vitals: There were no vitals taken for this visit. BMI: Estimated body mass index is 29.29 kg/m as calculated from the following:   Height as of 07/07/23: 5\' 5"  (1.651 m).   Weight as of 07/07/23: 176 lb  (79.8 kg). Ideal: Ideal body weight: 57 kg (125 lb 10.6 oz) Adjusted ideal body weight: 66.1 kg (145 lb 12.8 oz)  Assessment   Diagnosis Status  No diagnosis found. Controlled Controlled Controlled   Updated Problems: No problems updated.  Plan of Care  Problem-specific:  Assessment and Plan            Shannon [name] has a current medication list which includes the following long-term medication(s): albuterol, apixaban, furosemide, loratadine, metformin, metoprolol tartrate, omeprazole, simethicone, and spironolactone.  Pharmacotherapy (Medications Ordered): No orders of the defined types were placed in this encounter.  Orders:  No orders of the defined types were placed in this encounter.  Follow-up plan:   No follow-ups on file.      Interventional Therapies  Risk Factors  Considerations  Medical Comorbidities:  Eliquis Anticoagulation: (Stop: 3 days  Restart: 6 hours)  A-Fib  BA  IDDM w/ hx. Coma  steroid-induced MD  Autoimmune hepatitis  liver cirrhosis  Hx. Portal Encephalopathy  Portal-HTN  GERD  Esophageal varices  Hx. Adrenal insuficiency  SLE (Lupus)  dilated cardiomyopathy  Thrombocytopoenia  BA     Planned  Pending:   Diagnostic bilateral lumbar facet (L2-S1) MBB #2    Under consideration:   Therapeutic bilateral lumbar facet RFA #1  Therapeutic L5-S1 Intracept procedure    Completed:   Diagnostic bilateral lumbar facet (L2-S1) MBB x1 (06/09/2023) (100/100/80/75)    Therapeutic  Palliative (PRN) options:   None established   Completed by other providers:   Therapeutic left sacroiliac joint inj. x3 (03/29/2015, 04/16/2020, 09/08/2022) by Redge Gainer, DO ([facility])  Therapeutic knee injections by orthopedics Therapeutic shoulder injections by [facility]      Recent Visits Date Type Provider Dept  07/07/23 Procedure visit Delano Metz, MD [facility]  06/23/23 Office Visit Delano Metz, MD [facility]  06/09/23 Procedure visit Delano Metz, MD [facility]  05/11/23 Office Visit Delano Metz, MD [facility]  04/20/23 Office Visit Delano Metz, MD [facility]  Showing recent visits within past 90 days and meeting all other requirements Future Appointments Date Type Provider Dept  07/21/23 Appointment Delano Metz, MD [facility]  Showing future appointments within next 90 days and meeting all other requirements  I discussed the assessment and treatment plan with the patient. The patient was provided an opportunity to ask questions and all were answered. The  patient agreed with the plan and demonstrated an understanding of the instructions.  Patient advised to call back or seek an in-person evaluation if the symptoms or condition worsens.  Duration of encounter: *** minutes.  Total time on encounter, as per AMA guidelines included both the face-to-face and non-face-to-face time personally spent by the physician and/or other qualified health care professional(s) on the day of the encounter (includes time in activities that require the physician or other qualified health care professional and does not include time in activities normally performed by clinical staff). Physician's time may include the following activities when performed: Preparing to see the patient (e.g., pre-charting review of records, searching for previously ordered imaging, lab work, and nerve conduction tests) Review of prior analgesic pharmacotherapies. Reviewing PMP Interpreting ordered tests (e.g., lab work, imaging, nerve conduction tests) Performing post-procedure evaluations, including interpretation of diagnostic procedures Obtaining and/or reviewing separately obtained history Performing a medically appropriate examination and/or evaluation Counseling and educating the patient/family/caregiver Ordering medications,  tests, or procedures Referring and communicating with other health care professionals (when not separately reported) Documenting clinical information in the electronic or other health record Independently interpreting results (not separately reported) and communicating results to the patient/ family/caregiver Care coordination (not separately reported)  Note by: Oswaldo Done, MD Date: 07/21/2023; Time: 3:02 PM

## 2023-07-21 ENCOUNTER — Encounter: Payer: Self-pay | Admitting: Pain Medicine

## 2023-07-21 ENCOUNTER — Ambulatory Visit: Payer: Medicare HMO | Attending: Pain Medicine | Admitting: Pain Medicine

## 2023-07-21 VITALS — BP 110/73 | HR 70 | Temp 97.2°F | Resp 16 | Ht 65.0 in | Wt 176.0 lb

## 2023-07-21 DIAGNOSIS — Z7901 Long term (current) use of anticoagulants: Secondary | ICD-10-CM

## 2023-07-21 DIAGNOSIS — M47817 Spondylosis without myelopathy or radiculopathy, lumbosacral region: Secondary | ICD-10-CM

## 2023-07-21 DIAGNOSIS — Z09 Encounter for follow-up examination after completed treatment for conditions other than malignant neoplasm: Secondary | ICD-10-CM | POA: Diagnosis present

## 2023-07-21 DIAGNOSIS — G8929 Other chronic pain: Secondary | ICD-10-CM

## 2023-07-21 DIAGNOSIS — M5459 Other low back pain: Secondary | ICD-10-CM

## 2023-07-21 DIAGNOSIS — M4316 Spondylolisthesis, lumbar region: Secondary | ICD-10-CM

## 2023-07-21 DIAGNOSIS — M545 Low back pain, unspecified: Secondary | ICD-10-CM | POA: Diagnosis present

## 2023-07-21 DIAGNOSIS — M5451 Vertebrogenic low back pain: Secondary | ICD-10-CM

## 2023-07-21 DIAGNOSIS — M47816 Spondylosis without myelopathy or radiculopathy, lumbar region: Secondary | ICD-10-CM | POA: Diagnosis present

## 2023-07-21 NOTE — Progress Notes (Signed)
 Safety precautions to be maintained throughout the outpatient stay will include: orient to surroundings, keep bed in low position, maintain call bell within reach at all times, provide assistance with transfer out of bed and ambulation.

## 2023-07-21 NOTE — Patient Instructions (Addendum)
 ______________________________________________________________________    Procedure instructions  Stop blood-thinners (Stop Eliquis for 3 full days)   Do not eat or drink fluids (other than water) for 6 hours before your procedure  No water for 2 hours before your procedure  Take your blood pressure medicine with a sip of water  Arrive 30 minutes before your appointment  If sedation is planned, bring suitable driver. Pennie Banter, Benedetto Goad, & public transportation are NOT APPROVED)  Carefully read the "Preparing for your procedure" detailed instructions  If you have questions call us at [phone]  Procedure appointments are for procedures only. NO medication refills or new problem evaluations.   ______________________________________________________________________      ______________________________________________________________________    Preparing for your procedure  Appointments: If you think you may not be able to keep your appointment, call 24-48 hours in advance to cancel. We need time to make it available to others.  Procedure visits are for procedures only. During your procedure appointment there will be: NO Prescription Refills*. NO medication changes or discussions*. NO discussion of disability issues*. NO unrelated pain problem evaluations*. NO evaluations to order other pain procedures*. *These will be addressed at a separate and distinct evaluation encounter on the provider's evaluation schedule and not during procedure days.  Instructions: Food intake: Avoid eating anything solid for at least 8 hours prior to your procedure. Clear liquid intake: You may take clear liquids such as water up to 2 hours prior to your procedure. (No carbonated drinks. No soda.) Transportation: Unless otherwise stated by your physician, bring a driver. (Driver cannot be a Market researcher, Pharmacist, community, or any other form of public transportation.) Morning Medicines: Except for blood thinners, take all  of your other morning medications with a sip of water. Make sure to take your heart and blood pressure medicines. If your blood pressure's lower number is above 100, the case will be rescheduled. Blood thinners: Make sure to stop your blood thinners as instructed.  If you take a blood thinner, but were not instructed to stop it, call our office [phone] and ask to talk to a nurse. Not stopping a blood thinner prior to certain procedures could lead to serious complications. Diabetics on insulin: Notify the staff so that you can be scheduled 1st case in the morning. If your diabetes requires high dose insulin, take only  of your normal insulin dose the morning of the procedure and notify the staff that you have done so. Preventing infections: Shower with an antibacterial soap the morning of your procedure.  Build-up your immune system: Take 1000 mg of Vitamin C with every meal (3 times a day) the day prior to your procedure. Antibiotics: Inform the nursing staff if you are taking any antibiotics or if you have any conditions that may require antibiotics prior to procedures. (Example: recent joint implants)   Pregnancy: If you are pregnant make sure to notify the nursing staff. Not doing so may result in injury to the fetus, including death.  Sickness: If you have a cold, fever, or any active infections, call and cancel or reschedule your procedure. Receiving steroids while having an infection may result in complications. Arrival: You must be in the facility at least 30 minutes prior to your scheduled procedure. Tardiness: Your scheduled time is also the cutoff time. If you do not arrive at least 15 minutes prior to your procedure, you will be rescheduled.  Children: Do not bring any children with you. Make arrangements to keep them home. Dress appropriately: There is always  a possibility that your clothing may get soiled. Avoid long dresses. Valuables: Do not bring any jewelry or  valuables.  Reasons to call and reschedule or cancel your procedure: (Following these recommendations will minimize the risk of a serious complication.) Surgeries: Avoid having procedures within 2 weeks of any surgery. (Avoid for 2 weeks before or after any surgery). Flu Shots: Avoid having procedures within 2 weeks of a flu shots or . (Avoid for 2 weeks before or after immunizations). Barium: Avoid having a procedure within 7-10 days after having had a radiological study involving the use of radiological contrast. (Myelograms, Barium swallow or enema study). Heart attacks: Avoid any elective procedures or surgeries for the initial 6 months after a "Myocardial Infarction" (Heart Attack). Blood thinners: It is imperative that you stop these medications before procedures. Let us know if you if you take any blood thinner.  Infection: Avoid procedures during or within two weeks of an infection (including chest colds or gastrointestinal problems). Symptoms associated with infections include: Localized redness, fever, chills, night sweats or profuse sweating, burning sensation when voiding, cough, congestion, stuffiness, runny nose, sore throat, diarrhea, nausea, vomiting, cold or Flu symptoms, recent or current infections. It is specially important if the infection is over the area that we intend to treat. Heart and lung problems: Symptoms that may suggest an active cardiopulmonary problem include: cough, chest pain, breathing difficulties or shortness of breath, dizziness, ankle swelling, uncontrolled high or unusually low blood pressure, and/or palpitations. If you are experiencing any of these symptoms, cancel your procedure and contact your primary care physician for an evaluation.  Remember:  Regular Business hours are:  Monday to Thursday 8:00 AM to 4:00 PM  Provider's Schedule: Delano Metz, MD:  Procedure days: Tuesday and Thursday 7:30 AM to 4:00 PM  Edward Jolly, MD:  Procedure days:  Monday and Wednesday 7:30 AM to 4:00 PM Last  Updated: 04/21/2023 ______________________________________________________________________      ______________________________________________________________________    General Risks and Possible Complications  Patient Responsibilities: It is important that you read this as it is part of your informed consent. It is our duty to inform you of the risks and possible complications associated with treatments offered to you. It is your responsibility as a patient to read this and to ask questions about anything that is not clear or that you believe was not covered in this document.  Patient's Rights: You have the right to refuse treatment. You also have the right to change your mind, even after initially having agreed to have the treatment done. However, under this last option, if you wait until the last second to change your mind, you may be charged for the materials used up to that point.  Introduction: Medicine is not an Visual merchandiser. Everything in Medicine, including the lack of treatment(s), carries the potential for danger, harm, or loss (which is by definition: Risk). In Medicine, a complication is a secondary problem, condition, or disease that can aggravate an already existing one. All treatments carry the risk of possible complications. The fact that a side effects or complications occurs, does not imply that the treatment was conducted incorrectly. It must be clearly understood that these can happen even when everything is done following the highest safety standards.  No treatment: You can choose not to proceed with the proposed treatment alternative. The "PRO(s)" would include: avoiding the risk of complications associated with the therapy. The "CON(s)" would include: not getting any of the treatment benefits. These benefits fall under  one of three categories: diagnostic; therapeutic; and/or palliative. Diagnostic benefits include: getting  information which can ultimately lead to improvement of the disease or symptom(s). Therapeutic benefits are those associated with the successful treatment of the disease. Finally, palliative benefits are those related to the decrease of the primary symptoms, without necessarily curing the condition (example: decreasing the pain from a flare-up of a chronic condition, such as incurable terminal cancer).  General Risks and Complications: These are associated to most interventional treatments. They can occur alone, or in combination. They fall under one of the following six (6) categories: no benefit or worsening of symptoms; bleeding; infection; nerve damage; allergic reactions; and/or death. No benefits or worsening of symptoms: In Medicine there are no guarantees, only probabilities. No healthcare provider can ever guarantee that a medical treatment will work, they can only state the probability that it may. Furthermore, there is always the possibility that the condition may worsen, either directly, or indirectly, as a consequence of the treatment. Bleeding: This is more common if the patient is taking a blood thinner, either prescription or over the counter (example: Goody Powders, Fish oil, Aspirin, Garlic, etc.), or if suffering a condition associated with impaired coagulation (example: Hemophilia, cirrhosis of the liver, low platelet counts, etc.). However, even if you do not have one on these, it can still happen. If you have any of these conditions, or take one of these drugs, make sure to notify your treating physician. Infection: This is more common in patients with a compromised immune system, either due to disease (example: diabetes, cancer, human immunodeficiency virus [HIV], etc.), or due to medications or treatments (example: therapies used to treat cancer and rheumatological diseases). However, even if you do not have one on these, it can still happen. If you have any of these conditions, or take  one of these drugs, make sure to notify your treating physician. Nerve Damage: This is more common when the treatment is an invasive one, but it can also happen with the use of medications, such as those used in the treatment of cancer. The damage can occur to small secondary nerves, or to large primary ones, such as those in the spinal cord and brain. This damage may be temporary or permanent and it may lead to impairments that can range from temporary numbness to permanent paralysis and/or brain death. Allergic Reactions: Any time a substance or material comes in contact with our body, there is the possibility of an allergic reaction. These can range from a mild skin rash (contact dermatitis) to a severe systemic reaction (anaphylactic reaction), which can result in death. Death: In general, any medical intervention can result in death, most of the time due to an unforeseen complication. ______________________________________________________________________      ______________________________________________________________________    Blood Thinners  IMPORTANT NOTICE:  If you take any of these, make sure to notify the nursing staff.  Failure to do so may result in serious injury.  Recommended time intervals to stop and restart blood-thinners, before & after invasive procedures  Generic Name Brand Name Pre-procedure: Stop medication for this amount of time before your procedure: Post-procedure: Wait this amount of time after the procedure before restarting your medication:  Abciximab Reopro 15 days 2 hrs  Alteplase Activase 10 days 10 days  Anagrelide Agrylin    Apixaban Eliquis 3 days 6 hrs  Cilostazol Pletal 3 days 5 hrs  Clopidogrel Plavix 7-10 days 2 hrs  Dabigatran Pradaxa 5 days 6 hrs  Dalteparin Fragmin 24 hours  4 hrs  Dipyridamole Aggrenox 11days 2 hrs  Edoxaban Lixiana; Savaysa 3 days 2 hrs  Enoxaparin  Lovenox 24 hours 4 hrs  Eptifibatide Integrillin 8 hours 2 hrs   Fondaparinux  Arixtra 72 hours 12 hrs  Hydroxychloroquine Plaquenil 11 days   Prasugrel Effient 7-10 days 6 hrs  Reteplase Retavase 10 days 10 days  Rivaroxaban Xarelto 3 days 6 hrs  Ticagrelor Brilinta 5-7 days 6 hrs  Ticlopidine Ticlid 10-14 days 2 hrs  Tinzaparin Innohep 24 hours 4 hrs  Tirofiban Aggrastat 8 hours 2 hrs  Warfarin Coumadin 5 days 2 hrs   Other medications with blood-thinning effects  NOTE: Consider stopping these if you have prolonged bleeding despite not taking any of the above blood thinners. Otherwise ask your provider and this will be decided on a case-by-case basis.  Product indications Generic (Brand) names Note  Cholesterol Lipitor Stop 4 days before procedure  Blood thinner (injectable) Heparin (LMW or LMWH Heparin) Stop 24 hours before procedure  Cancer Ibrutinib (Imbruvica) Stop 7 days before procedure  Malaria/Rheumatoid Hydroxychloroquine (Plaquenil) Stop 11 days before procedure  Thrombolytics  10 days before or after procedures   Over-the-counter (OTC) Products with blood-thinning effects  NOTE: Consider stopping these if you have prolonged bleeding despite not taking any of the above blood thinners. Otherwise ask your provider and this will be decided on a case-by-case basis.  Product Common names Stop Time  Aspirin > 325 mg Goody Powders, Excedrin, etc. 11 days  Aspirin <= 81 mg  7 days  Fish oil  4 days  Garlic supplements  7 days  Ginkgo biloba  36 hours  Ginseng  24 hours  NSAIDs Ibuprofen, Naprosyn, etc. 3 days  Vitamin E  4 days   ______________________________________________________________________

## 2023-08-20 ENCOUNTER — Telehealth: Payer: Self-pay | Admitting: Pain Medicine

## 2023-08-20 NOTE — Telephone Encounter (Signed)
 Patient called and wants to proceed with the RFA ordered at last visit. Please check to see if needs PA. Thanks [employer]

## 2023-09-15 ENCOUNTER — Ambulatory Visit
Admission: RE | Admit: 2023-09-15 | Discharge: 2023-09-15 | Disposition: A | Discharge status: Home / Self Care | Source: Ambulatory Visit | Attending: Pain Medicine | Admitting: Pain Medicine

## 2023-09-15 ENCOUNTER — Ambulatory Visit: Attending: Pain Medicine | Admitting: Pain Medicine

## 2023-09-15 ENCOUNTER — Encounter: Payer: Self-pay | Admitting: Pain Medicine

## 2023-09-15 VITALS — BP 121/80 | HR 71 | Temp 97.5°F | Resp 18 | Ht 65.0 in | Wt 170.0 lb

## 2023-09-15 DIAGNOSIS — M47816 Spondylosis without myelopathy or radiculopathy, lumbar region: Secondary | ICD-10-CM

## 2023-09-15 DIAGNOSIS — G8929 Other chronic pain: Secondary | ICD-10-CM | POA: Diagnosis present

## 2023-09-15 DIAGNOSIS — M47817 Spondylosis without myelopathy or radiculopathy, lumbosacral region: Secondary | ICD-10-CM | POA: Diagnosis present

## 2023-09-15 DIAGNOSIS — M5459 Other low back pain: Secondary | ICD-10-CM

## 2023-09-15 DIAGNOSIS — G8918 Other acute postprocedural pain: Secondary | ICD-10-CM | POA: Diagnosis present

## 2023-09-15 DIAGNOSIS — M545 Low back pain, unspecified: Secondary | ICD-10-CM

## 2023-09-15 DIAGNOSIS — M4316 Spondylolisthesis, lumbar region: Secondary | ICD-10-CM | POA: Diagnosis present

## 2023-09-15 DIAGNOSIS — Z7901 Long term (current) use of anticoagulants: Secondary | ICD-10-CM | POA: Diagnosis present

## 2023-09-15 MED ORDER — TRAMADOL HCL 50 MG PO TABS: 50.0000 mg | ORAL_TABLET | Freq: Four times a day (QID) | ORAL | 0 refills | Status: AC | PRN

## 2023-09-15 MED ORDER — TIZANIDINE HCL 2 MG PO CAPS: 2.0000 mg | ORAL_CAPSULE | Freq: Three times a day (TID) | ORAL | 0 refills | Status: AC | PRN

## 2023-09-15 MED ORDER — ROPIVACAINE HCL 2 MG/ML IJ SOLN
10.0000 mL | Freq: Once | INTRAMUSCULAR | Status: AC
Start: 2023-09-15 — End: 2023-09-15
  Administered 2023-09-15: 10 mL via PERINEURAL

## 2023-09-15 MED ORDER — LIDOCAINE HCL 2 % IJ SOLN
INTRAMUSCULAR | Status: AC
  Filled 2023-09-15: qty 20

## 2023-09-15 MED ORDER — ROPIVACAINE HCL 2 MG/ML IJ SOLN
INTRAMUSCULAR | Status: AC
  Filled 2023-09-15: qty 20

## 2023-09-15 MED ORDER — LIDOCAINE HCL 2 % IJ SOLN
20.0000 mL | Freq: Once | INTRAMUSCULAR | Status: AC
  Administered 2023-09-15: 400 mg

## 2023-09-15 MED ORDER — FENTANYL CITRATE (PF) 100 MCG/2ML IJ SOLN
25.0000 ug | INTRAMUSCULAR | Status: DC | PRN
  Administered 2023-09-15: 50 ug via INTRAVENOUS

## 2023-09-15 MED ORDER — PENTAFLUOROPROP-TETRAFLUOROETH EX AERO
INHALATION_SPRAY | Freq: Once | CUTANEOUS | Status: AC
Start: 2023-09-15 — End: 2023-09-15
  Administered 2023-09-15: 30 via TOPICAL

## 2023-09-15 MED ORDER — MIDAZOLAM HCL 5 MG/5ML IJ SOLN
0.5000 mg | Freq: Once | INTRAMUSCULAR | Status: AC
  Administered 2023-09-15 (×3): 1 mg via INTRAVENOUS

## 2023-09-15 MED ORDER — GABAPENTIN 100 MG PO CAPS: 100.0000 mg | ORAL_CAPSULE | Freq: Every day | ORAL | 0 refills | Status: AC

## 2023-09-15 MED ORDER — TRIAMCINOLONE ACETONIDE 40 MG/ML IJ SUSP
INTRAMUSCULAR | Status: AC
  Filled 2023-09-15: qty 1

## 2023-09-15 MED ORDER — MIDAZOLAM HCL 5 MG/5ML IJ SOLN
INTRAMUSCULAR | Status: AC
  Filled 2023-09-15: qty 5

## 2023-09-15 MED ORDER — FENTANYL CITRATE (PF) 100 MCG/2ML IJ SOLN
INTRAMUSCULAR | Status: AC
Start: 2023-09-15 — End: ?
  Filled 2023-09-15: qty 2

## 2023-09-15 NOTE — Progress Notes (Signed)
 Safety precautions to be maintained throughout the outpatient stay will include: orient to surroundings, keep bed in low position, maintain call bell within reach at all times, provide assistance with transfer out of bed and ambulation.

## 2023-09-15 NOTE — Patient Instructions (Addendum)
 ______________________________________________________________________    Post-Radiofrequency (RF) Discharge Instructions  You have just completed a Radiofrequency Neurotomy.  The following instructions will provide you with information and guidelines for self-care upon discharge.  If at any time you have questions or concerns please call your physician. DO NOT DRIVE YOURSELF!!  Instructions: Apply ice: Fill a plastic sandwich bag with crushed ice. Cover it with a small towel and apply to injection site. Apply for 15 minutes then remove x 15 minutes. Repeat sequence on day of procedure, until you go to bed. The purpose is to minimize swelling and discomfort after procedure. Apply heat: Apply heat to procedure site starting the day following the procedure. The purpose is to treat any soreness and discomfort from the procedure. Food intake: No eating limitations, unless stipulated above.  Nevertheless, if you have had sedation, you may experience some nausea.  In this case, it may be wise to wait at least two hours prior to resuming regular diet. Physical activities: Keep activities to a minimum for the first 8 hours after the procedure. For the first 24 hours after the procedure, do not drive a motor vehicle,  Operate heavy machinery, power tools, or handle any weapons.  Consider walking with the use of an assistive device or accompanied by an adult for the first 24 hours.  Do not drink alcoholic beverages including beer.  Do not make any important decisions or sign any legal documents. Go home and rest today.  Resume activities tomorrow, as tolerated.  Use caution in moving about as you may experience mild leg weakness.  Use caution in cooking, use of household electrical appliances and climbing steps. Driving: If you have received any sedation, you are not allowed to drive for 24 hours after your procedure. Blood thinner: Restart your blood thinner 6 hours after your procedure. (Only for those taking  blood thinners) Insulin : As soon as you can eat, you may resume your normal dosing schedule. (Only for those taking insulin ) Medications: May resume pre-procedure medications.  Do not take any drugs, other than what has been prescribed to you. Infection prevention: Keep procedure site clean and dry. Post-procedure Pain Diary: Extremely important that this be done correctly and accurately. Recorded information will be used to determine the next step in treatment. Pain evaluated is that of treated area only. Do not include pain from an untreated area. Complete every hour, on the hour, for the initial 8 hours. Set an alarm to help you do this part accurately. Do not go to sleep and have it completed later. It will not be accurate. Follow-up appointment: Keep your follow-up appointment after the procedure. Usually 2-6 weeks after radiofrequency. Bring you pain diary. The information collected will be essential for your long-term care.   Expect: From numbing medicine (AKA: Local Anesthetics): Numbness or decrease in pain. Onset: Full effect within 15 minutes of injected. Duration: It will depend on the type of local anesthetic used. On the average, 1 to 8 hours.  From steroids (when added): Decrease in swelling or inflammation. Once inflammation is improved, relief of the pain will follow. Onset of benefits: Depends on the amount of swelling present. The more swelling, the longer it will take for the benefits to be seen. In some cases, up to 10 days. Duration: Steroids will stay in the system x 2 weeks. Duration of benefits will depend on multiple posibilities including persistent irritating factors. From procedure: Some discomfort is to be expected once the numbing medicine wears off. In the case of  radiofrequency procedures, this may last as long as 6 weeks. Additional post-procedure pain medication is provided for this. Discomfort is minimized if ice and heat are applied as instructed.  Call  if: You experience numbness and weakness that gets worse with time, as opposed to wearing off. He experience any unusual bleeding, difficulty breathing, or loss of the ability to control your bowel and bladder. (This applies to Spinal procedures only) You experience any redness, swelling, heat, red streaks, elevated temperature, fever, or any other signs of a possible infection.  Emergency Numbers: Durning business hours (Monday - Thursday, 8:00 AM - 4:00 PM) (Friday, 9:00 AM - 12:00 Noon): [phone] After hours: [phone] ______________________________________________________________________     ______________________________________________________________________    Procedure instructions  Stop blood-thinners  Do not eat or drink fluids (other than water) for 6 hours before your procedure  No water for 2 hours before your procedure  Take your blood pressure medicine with a sip of water  Arrive 30 minutes before your appointment  If sedation is planned, bring suitable driver. Teddie Favre, [place], & public transportation are NOT APPROVED)  Carefully read the "Preparing for your procedure" detailed instructions  If you have questions call us  at [phone]  Procedure appointments are for procedures only. NO medication refills or new problem evaluations.   ______________________________________________________________________      ______________________________________________________________________    Preparing for your procedure  Appointments: If you think you may not be able to keep your appointment, call 24-48 hours in advance to cancel. We need time to make it available to others.  Procedure visits are for procedures only. During your procedure appointment there will be: NO Prescription Refills*. NO medication changes or discussions*. NO discussion of disability issues*. NO unrelated pain problem evaluations*. NO evaluations to order other pain  procedures*. *These will be addressed at a separate and distinct evaluation encounter on the provider's evaluation schedule and not during procedure days.  Instructions: Food intake: Avoid eating anything solid for at least 8 hours prior to your procedure. Clear liquid intake: You may take clear liquids such as water up to 2 hours prior to your procedure. (No carbonated drinks. No soda.) Transportation: Unless otherwise stated by your physician, bring a driver. (Driver cannot be a Market researcher, Pharmacist, community, or any other form of public transportation.) Morning Medicines: Except for blood thinners, take all of your other morning medications with a sip of water. Make sure to take your heart and blood pressure medicines. If your blood pressure's lower number is above 100, the case will be rescheduled. Blood thinners: Make sure to stop your blood thinners as instructed.  If you take a blood thinner, but were not instructed to stop it, call our office [phone] and ask to talk to a nurse. Not stopping a blood thinner prior to certain procedures could lead to serious complications. Diabetics on insulin : Notify the staff so that you can be scheduled 1st case in the morning. If your diabetes requires high dose insulin , take only  of your normal insulin  dose the morning of the procedure and notify the staff that you have done so. Preventing infections: Shower with an antibacterial soap the morning of your procedure.  Build-up your immune system: Take 1000 mg of Vitamin C with every meal (3 times a day) the day prior to your procedure. Antibiotics: Inform the nursing staff if you are taking any antibiotics or if you have any conditions that may require antibiotics prior to procedures. (Example: recent joint implants)  Pregnancy: If you are pregnant make sure to notify the nursing staff. Not doing so may result in injury to the fetus, including death.  Sickness: If you have a cold, fever, or any active infections, call  and cancel or reschedule your procedure. Receiving steroids while having an infection may result in complications. Arrival: You must be in the facility at least 30 minutes prior to your scheduled procedure. Tardiness: Your scheduled time is also the cutoff time. If you do not arrive at least 15 minutes prior to your procedure, you will be rescheduled.  Children: Do not bring any children with you. Make arrangements to keep them home. Dress appropriately: There is always a possibility that your clothing may get soiled. Avoid long dresses. Valuables: Do not bring any jewelry or valuables.  Reasons to call and reschedule or cancel your procedure: (Following these recommendations will minimize the risk of a serious complication.) Surgeries: Avoid having procedures within 2 weeks of any surgery. (Avoid for 2 weeks before or after any surgery). Flu Shots: Avoid having procedures within 2 weeks of a flu shots or . (Avoid for 2 weeks before or after immunizations). Barium: Avoid having a procedure within 7-10 days after having had a radiological study involving the use of radiological contrast. (Myelograms, Barium swallow or enema study). Heart attacks: Avoid any elective procedures or surgeries for the initial 6 months after a "Myocardial Infarction" (Heart Attack). Blood thinners: It is imperative that you stop these medications before procedures. Let us  know if you if you take any blood thinner.  Infection: Avoid procedures during or within two weeks of an infection (including chest colds or gastrointestinal problems). Symptoms associated with infections include: Localized redness, fever, chills, night sweats or profuse sweating, burning sensation when voiding, cough, congestion, stuffiness, runny nose, sore throat, diarrhea, nausea, vomiting, cold or Flu symptoms, recent or current infections. It is specially important if the infection is over the area that we intend to treat. Heart and lung problems:  Symptoms that may suggest an active cardiopulmonary problem include: cough, chest pain, breathing difficulties or shortness of breath, dizziness, ankle swelling, uncontrolled high or unusually low blood pressure, and/or palpitations. If you are experiencing any of these symptoms, cancel your procedure and contact your primary care physician for an evaluation.  Remember:  Regular Business hours are:  Monday to Thursday 8:00 AM to 4:00 PM  Provider's Schedule: Renaldo Caroli, MD:  Procedure days: Tuesday and Thursday 7:30 AM to 4:00 PM  Cephus Collin, MD:  Procedure days: Monday and Wednesday 7:30 AM to 4:00 PM Last  Updated: 04/21/2023 ______________________________________________________________________      ______________________________________________________________________    General Risks and Possible Complications  Patient Responsibilities: It is important that you read this as it is part of your informed consent. It is our duty to inform you of the risks and possible complications associated with treatments offered to you. It is your responsibility as a patient to read this and to ask questions about anything that is not clear or that you believe was not covered in this document.  Patient's Rights: You have the right to refuse treatment. You also have the right to change your mind, even after initially having agreed to have the treatment done. However, under this last option, if you wait until the last second to change your mind, you may be charged for the materials used up to that point.  Introduction: Medicine is not an Visual merchandiser. Everything in Medicine, including the lack of treatment(s), carries the potential  for danger, harm, or loss (which is by definition: Risk). In Medicine, a complication is a secondary problem, condition, or disease that can aggravate an already existing one. All treatments carry the risk of possible complications. The fact that a side effects or  complications occurs, does not imply that the treatment was conducted incorrectly. It must be clearly understood that these can happen even when everything is done following the highest safety standards.  No treatment: You can choose not to proceed with the proposed treatment alternative. The "PRO(s)" would include: avoiding the risk of complications associated with the therapy. The "CON(s)" would include: not getting any of the treatment benefits. These benefits fall under one of three categories: diagnostic; therapeutic; and/or palliative. Diagnostic benefits include: getting information which can ultimately lead to improvement of the disease or symptom(s). Therapeutic benefits are those associated with the successful treatment of the disease. Finally, palliative benefits are those related to the decrease of the primary symptoms, without necessarily curing the condition (example: decreasing the pain from a flare-up of a chronic condition, such as incurable terminal cancer).  General Risks and Complications: These are associated to most interventional treatments. They can occur alone, or in combination. They fall under one of the following six (6) categories: no benefit or worsening of symptoms; bleeding; infection; nerve damage; allergic reactions; and/or death. No benefits or worsening of symptoms: In Medicine there are no guarantees, only probabilities. No healthcare provider can ever guarantee that a medical treatment will work, they can only state the probability that it may. Furthermore, there is always the possibility that the condition may worsen, either directly, or indirectly, as a consequence of the treatment. Bleeding: This is more common if the patient is taking a blood thinner, either prescription or over the counter (example: Goody Powders, Fish oil, Aspirin, Garlic, etc.), or if suffering a condition associated with impaired coagulation (example: Hemophilia, cirrhosis of the liver, low  platelet counts, etc.). However, even if you do not have one on these, it can still happen. If you have any of these conditions, or take one of these drugs, make sure to notify your treating physician. Infection: This is more common in patients with a compromised immune system, either due to disease (example: diabetes, cancer, human immunodeficiency virus [HIV], etc.), or due to medications or treatments (example: therapies used to treat cancer and rheumatological diseases). However, even if you do not have one on these, it can still happen. If you have any of these conditions, or take one of these drugs, make sure to notify your treating physician. Nerve Damage: This is more common when the treatment is an invasive one, but it can also happen with the use of medications, such as those used in the treatment of cancer. The damage can occur to small secondary nerves, or to large primary ones, such as those in the spinal cord and brain. This damage may be temporary or permanent and it may lead to impairments that can range from temporary numbness to permanent paralysis and/or brain death. Allergic Reactions: Any time a substance or material comes in contact with our body, there is the possibility of an allergic reaction. These can range from a mild skin rash (contact dermatitis) to a severe systemic reaction (anaphylactic reaction), which can result in death. Death: In general, any medical intervention can result in death, most of the time due to an unforeseen complication. ______________________________________________________________________      ______________________________________________________________________    Blood Thinners  IMPORTANT NOTICE:  If you take  any of these, make sure to notify the nursing staff.  Failure to do so may result in serious injury.  Recommended time intervals to stop and restart blood-thinners, before & after invasive procedures  Generic Name Brand Name  Pre-procedure: Stop medication for this amount of time before your procedure: Post-procedure: Wait this amount of time after the procedure before restarting your medication:  Abciximab Reopro 15 days 2 hrs  Alteplase Activase 10 days 10 days  Anagrelide Agrylin    Apixaban  Eliquis  3 days 6 hrs  Cilostazol Pletal 3 days 5 hrs  Clopidogrel Plavix 7-10 days 2 hrs  Dabigatran Pradaxa 5 days 6 hrs  Dalteparin Fragmin 24 hours 4 hrs  Dipyridamole Aggrenox 11days 2 hrs  Edoxaban Lixiana; Savaysa 3 days 2 hrs  Enoxaparin  Lovenox 24 hours 4 hrs  Eptifibatide Integrillin 8 hours 2 hrs  Fondaparinux  Arixtra 72 hours 12 hrs  Hydroxychloroquine Plaquenil 11 days   Prasugrel Effient 7-10 days 6 hrs  Reteplase Retavase 10 days 10 days  Rivaroxaban Xarelto 3 days 6 hrs  Ticagrelor Brilinta 5-7 days 6 hrs  Ticlopidine Ticlid 10-14 days 2 hrs  Tinzaparin Innohep 24 hours 4 hrs  Tirofiban Aggrastat 8 hours 2 hrs  Warfarin Coumadin 5 days 2 hrs   Other medications with blood-thinning effects  NOTE: Consider stopping these if you have prolonged bleeding despite not taking any of the above blood thinners. Otherwise ask your provider and this will be decided on a case-by-case basis.  Product indications Generic (Brand) names Note  Cholesterol Lipitor Stop 4 days before procedure  Blood thinner (injectable) Heparin (LMW or LMWH Heparin) Stop 24 hours before procedure  Cancer Ibrutinib (Imbruvica) Stop 7 days before procedure  Malaria/Rheumatoid Hydroxychloroquine (Plaquenil) Stop 11 days before procedure  Thrombolytics  10 days before or after procedures   Over-the-counter (OTC) Products with blood-thinning effects  NOTE: Consider stopping these if you have prolonged bleeding despite not taking any of the above blood thinners. Otherwise ask your provider and this will be decided on a case-by-case basis.  Product Common names Stop Time  Aspirin > 325 mg Goody Powders, Excedrin, etc. 11 days  Aspirin  <= 81 mg  7 days  Fish oil  4 days  Garlic supplements  7 days  Ginkgo biloba  36 hours  Ginseng  24 hours  NSAIDs Ibuprofen , Naprosyn, etc. 3 days  Vitamin E  4 days   ______________________________________________________________________

## 2023-09-15 NOTE — Progress Notes (Signed)
 PROVIDER NOTE: Interpretation of information contained herein should be left to medically-trained personnel. Specific patient instructions are provided elsewhere under "Patient Instructions" section of medical record. This document was created in part using STT-dictation technology, any transcriptional errors that may result from this process are unintentional.  Patient: Shannon [name] Type: Established DOB: [birth year] MRN: [id number] PCP: Britta Candy, MD  Service: Procedure DOS: 09/15/2023 Setting: Ambulatory Location: Ambulatory outpatient facility Delivery: Face-to-face Provider: Candi Chafe, MD Specialty: Interventional Pain Management Specialty designation: 09 Location: Outpatient facility Ref. Prov.: Renaldo Caroli, MD       Interventional Therapy   Procedure: Lumbar Facet, Medial Branch Radiofrequency Ablation (RFA) #1 (w/o STEROIDS) Laterality: Left (-LT)  Level: L2, L3, L4, L5, and S1 Medial Branch Level(s). These levels will denervate the L3-4, L4-5, and L5-S1 lumbar facet joints.  Imaging: Fluoroscopy-guided Spinal ([id number]) Anesthesia: Local anesthesia (1-2% Lidocaine ) Anxiolysis: IV Versed  3.0 mg Sedation: Moderate Sedation Fentanyl  1 mL (50 mcg) DOS: 09/15/2023  Performed by: Candi Chafe, MD  Purpose: Therapeutic/Palliative Indications: Low back pain severe enough to impact quality of life or function. Indications: 1. Chronic low back pain (Bilateral) w/o sciatica   2. Lumbar facet joint pain   3. Lumbosacral facet hypertrophy   4. Lumbar facet hypertrophy   5. Lumbar Grade 1 Anterolisthesis of L3/L4 & L4/L5   6. Lumbar facet joint syndrome   7. Low back pain of over 3 months duration   8. Mechanical low back pain   9. Multifactorial low back pain   10. Chronic anticoagulation (Eliquis )    Shannon [name] has been dealing with the above chronic pain for longer than three months and has either failed to respond, was unable to tolerate, or simply  did not get enough benefit from other more conservative therapies including, but not limited to: 1. Over-the-counter medications 2. Anti-inflammatory medications 3. Muscle relaxants 4. Membrane stabilizers 5. Opioids 6. Physical therapy and/or chiropractic manipulation 7. Modalities (Heat, ice, etc.) 8. Invasive techniques such as nerve blocks. Ms. Hem has attained more than 50% relief of the pain from a series of diagnostic injections conducted in separate occasions.  Pain Score: Pre-procedure: 4 /10 Post-procedure: 0-No pain/10     Position / Prep / Materials:  Position: Prone  Prep solution: ChloraPrep (2% chlorhexidine gluconate and 70% isopropyl alcohol) Prep Area: Entire Lumbosacral Region (Lower back from mid-thoracic region to end of tailbone and from flank to flank.) Materials:  Tray: RFA (Radiofrequency) tray Needle(s):  Type: RFA (Teflon-coated radiofrequency ablation needles) Gauge (G): 22  Length: Regular (10cm) Qty: 5     H&P (Pre-op Assessment):  Shannon [name] is a 68 y.o. (year old), female patient, seen today for interventional treatment. She  has a past surgical history that includes Laparoscopic hysterectomy; Tubal ligation; Back surgery; Abdominal hysterectomy; Hallux valgus [place] (Left, 02/09/2019); and Hammer toe surgery (Left, 02/09/2019). Shannon [name] has a current medication list which includes the following prescription(s): acetaminophen , albuterol , apixaban , azathioprine , blood glucose meter kit and supplies, blood glucose meter kit and supplies, one touch ultra system kit, celecoxib , vitamin d3, furosemide , gabapentin, onetouch verio, tresiba  flextouch, pen needles, lactulose, lidocaine , loratadine, losartan, magnesium oxide -mg supplement, metformin , metoprolol  tartrate, miconazole  3, nystatin , omeprazole , simethicone, spironolactone , tizanidine, tramadol , [START ON 09/22/2023] tramadol , trazodone, and cyanocobalamin , and the following Facility-Administered  Medications: fentanyl . Her primarily concern today is the Back Pain  Initial Vital Signs:  Pulse/HCG Rate: 70ECG Heart Rate: 70 (nsr) Temp: 97.8 F (36.6 C) Resp: 16 BP: 131/82 SpO2: 100 %  BMI: Estimated body mass index is 28.29 kg/m as calculated from the following:   Height as of this encounter: 5\' 5"  (1.651 m).   Weight as of this encounter: 170 lb (77.1 kg).  Risk Assessment: Allergies: Reviewed. She is allergic to hydrocodone , lipitor [atorvastatin  calcium ], milk-related compounds, oxycodone, wellbutrin [bupropion], and adhesive [tape].  Allergy Precautions: None required Coagulopathies: Reviewed. None identified.  Blood-thinner therapy: None at this time Active Infection(s): Reviewed. None identified. Shannon [name] is afebrile  Site Confirmation: Shannon [name] was asked to confirm the procedure and laterality before marking the site Procedure checklist: Completed Consent: Before the procedure and under the influence of no sedative(s), amnesic(s), or anxiolytics, the patient was informed of the treatment options, risks and possible complications. To fulfill our ethical and legal obligations, as recommended by the American Medical Association's Code of Ethics, I have informed the patient of my clinical impression; the nature and purpose of the treatment or procedure; the risks, benefits, and possible complications of the intervention; the alternatives, including doing nothing; the risk(s) and benefit(s) of the alternative treatment(s) or procedure(s); and the risk(s) and benefit(s) of doing nothing. The patient was provided information about the general risks and possible complications associated with the procedure. These may include, but are not limited to: failure to achieve desired goals, infection, bleeding, organ or nerve damage, allergic reactions, paralysis, and death. In addition, the patient was informed of those risks and complications associated to Spine-related procedures, such  as failure to decrease pain; infection (i.e.: Meningitis, epidural or intraspinal abscess); bleeding (i.e.: epidural hematoma, subarachnoid hemorrhage, or any other type of intraspinal or peri-dural bleeding); organ or nerve damage (i.e.: Any type of peripheral nerve, nerve root, or spinal cord injury) with subsequent damage to sensory, motor, and/or autonomic systems, resulting in permanent pain, numbness, and/or weakness of one or several areas of the body; allergic reactions; (i.e.: anaphylactic reaction); and/or death. Furthermore, the patient was informed of those risks and complications associated with the medications. These include, but are not limited to: allergic reactions (i.e.: anaphylactic or anaphylactoid reaction(s)); adrenal axis suppression; blood sugar elevation that in diabetics may result in ketoacidosis or comma; water retention that in patients with history of congestive heart failure may result in shortness of breath, pulmonary edema, and decompensation with resultant heart failure; weight gain; swelling or edema; medication-induced neural toxicity; particulate matter embolism and blood vessel occlusion with resultant organ, and/or nervous system infarction; and/or aseptic necrosis of one or more joints. Finally, the patient was informed that Medicine is not an exact science; therefore, there is also the possibility of unforeseen or unpredictable risks and/or possible complications that may result in a catastrophic outcome. The patient indicated having understood very clearly. We have given the patient no guarantees and we have made no promises. Enough time was given to the patient to ask questions, all of which were answered to the patient's satisfaction. Ms. Reda has indicated that she wanted to continue with the procedure. Attestation: I, the ordering provider, attest that I have discussed with the patient the benefits, risks, side-effects, alternatives, likelihood of achieving goals,  and potential problems during recovery for the procedure that I have provided informed consent. Date  Time: 09/15/2023  8:36 AM  Pre-Procedure Preparation:  Monitoring: As per clinic protocol. Respiration, ETCO2, SpO2, BP, heart rate and rhythm monitor placed and checked for adequate function Safety Precautions: Patient was assessed for positional comfort and pressure points before starting the procedure. Time-out: I initiated and conducted the "Time-out" before starting the  procedure, as per protocol. The patient was asked to participate by confirming the accuracy of the "Time Out" information. Verification of the correct person, site, and procedure were performed and confirmed by me, the nursing staff, and the patient. "Time-out" conducted as per Joint Commission's Universal Protocol (UP.01.01.01). Time: 0911 Start Time: 0912 hrs.  Description of Procedure:          Laterality: See above. Levels:  See above. Safety Precautions: Aspiration looking for blood return was conducted prior to all injections. At no point did we inject any substances, as a needle was being advanced. Before injecting, the patient was told to immediately notify me if she was experiencing any new onset of "ringing in the ears, or metallic taste in the mouth". No attempts were made at seeking any paresthesias. Safe injection practices and needle disposal techniques used. Medications properly checked for expiration dates. SDV (single dose vial) medications used. After the completion of the procedure, all disposable equipment used was discarded in the proper designated medical waste containers. Local Anesthesia: Protocol guidelines were followed. The patient was positioned over the fluoroscopy table. The area was prepped in the usual manner. The time-out was completed. The target area was identified using fluoroscopy. A 12-in long, straight, sterile hemostat was used with fluoroscopic guidance to locate the targets for each level  blocked. Once located, the skin was marked with an approved surgical skin marker. Once all sites were marked, the skin (epidermis, dermis, and hypodermis), as well as deeper tissues (fat, connective tissue and muscle) were infiltrated with a small amount of a short-acting local anesthetic, loaded on a 10cc syringe with a 25G, 1.5-in  Needle. An appropriate amount of time was allowed for local anesthetics to take effect before proceeding to the next step. Technical description of process:  Radiofrequency Ablation (RFA) L2 Medial Branch Nerve RFA: The target area for the L2 medial branch is at the junction of the postero-lateral aspect of the superior articular process and the superior, posterior, and medial edge of the transverse process of L3. Under fluoroscopic guidance, a Radiofrequency needle was inserted until contact was made with os over the superior postero-lateral aspect of the pedicular shadow (target area). Sensory and motor testing was conducted to properly adjust the position of the needle. Once satisfactory placement of the needle was achieved, the numbing solution was slowly injected after negative aspiration for blood. 2.0 mL of the nerve block solution was injected without difficulty or complication. After waiting for at least 3 minutes, the ablation was performed. Once completed, the needle was removed intact. L3 Medial Branch Nerve RFA: The target area for the L3 medial branch is at the junction of the postero-lateral aspect of the superior articular process and the superior, posterior, and medial edge of the transverse process of L4. Under fluoroscopic guidance, a Radiofrequency needle was inserted until contact was made with os over the superior postero-lateral aspect of the pedicular shadow (target area). Sensory and motor testing was conducted to properly adjust the position of the needle. Once satisfactory placement of the needle was achieved, the numbing solution was slowly injected after  negative aspiration for blood. 2.0 mL of the nerve block solution was injected without difficulty or complication. After waiting for at least 3 minutes, the ablation was performed. Once completed, the needle was removed intact. L4 Medial Branch Nerve RFA: The target area for the L4 medial branch is at the junction of the postero-lateral aspect of the superior articular process and the superior, posterior, and medial  edge of the transverse process of L5. Under fluoroscopic guidance, a Radiofrequency needle was inserted until contact was made with os over the superior postero-lateral aspect of the pedicular shadow (target area). Sensory and motor testing was conducted to properly adjust the position of the needle. Once satisfactory placement of the needle was achieved, the numbing solution was slowly injected after negative aspiration for blood. 2.0 mL of the nerve block solution was injected without difficulty or complication. After waiting for at least 3 minutes, the ablation was performed. Once completed, the needle was removed intact. L5 Medial Branch Nerve RFA: The target area for the L5 medial branch is at the junction of the postero-lateral aspect of the superior articular process of S1 and the superior, posterior, and medial edge of the sacral ala. Under fluoroscopic guidance, a Radiofrequency needle was inserted until contact was made with os over the superior postero-lateral aspect of the pedicular shadow (target area). Sensory and motor testing was conducted to properly adjust the position of the needle. Once satisfactory placement of the needle was achieved, the numbing solution was slowly injected after negative aspiration for blood. 2.0 mL of the nerve block solution was injected without difficulty or complication. After waiting for at least 3 minutes, the ablation was performed. Once completed, the needle was removed intact. S1 Medial Branch Nerve RFA: The target area for the S1 medial branch is  located inferior to the junction of the S1 superior articular process and the L5 inferior articular process, posterior, inferior, and lateral to the 6 o'clock position of the L5-S1 facet joint, just superior to the S1 posterior foramen. Under fluoroscopic guidance, the Radiofrequency needle was advanced until contact was made with os over the Target area. Sensory and motor testing was conducted to properly adjust the position of the needle. Once satisfactory placement of the needle was achieved, the numbing solution was slowly injected after negative aspiration for blood. 2.0 mL of the nerve block solution was injected without difficulty or complication. After waiting for at least 3 minutes, the ablation was performed. Once completed, the needle was removed intact. Radiofrequency lesioning (ablation):  Radiofrequency Generator: Medtronic AccurianTM AG 1000 RF Generator Sensory Stimulation Parameters: 50 Hz was used to locate & identify the nerve, making sure that the needle was positioned such that there was no sensory stimulation below 0.3 V or above 0.7 V. Motor Stimulation Parameters: 2 Hz was used to evaluate the motor component. Care was taken not to lesion any nerves that demonstrated motor stimulation of the lower extremities at an output of less than 2.5 times that of the sensory threshold, or a maximum of 2.0 V. Lesioning Technique Parameters: Standard Radiofrequency settings. (Not bipolar or pulsed.) Temperature Settings: 80 degrees C Lesioning time: 60 seconds Stationary intra-operative compliance: Compliant  Once the entire procedure was completed, the treated area was cleaned, making sure to leave some of the prepping solution back to take advantage of its long term bactericidal properties.    Illustration of the posterior view of the lumbar spine and the posterior neural structures. Laminae of L2 through S1 are labeled. DPRL5, dorsal primary ramus of L5; DPRS1, dorsal primary ramus of S1;  DPR3, dorsal primary ramus of L3; FJ, facet (zygapophyseal) joint L3-L4; I, inferior articular process of L4; LB1, lateral branch of dorsal primary ramus of L1; IAB, inferior articular branches from L3 medial branch (supplies L4-L5 facet joint); IBP, intermediate branch plexus; MB3, medial branch of dorsal primary ramus of L3; NR3, third lumbar nerve root; S,  superior articular process of L5; SAB, superior articular branches from L4 (supplies L4-5 facet joint also); TP3, transverse process of L3.  Facet Joint Innervation (* possible contribution)  L1-2 T12, L1 (L2*)  Medial Branch  L2-3 L1, L2 (L3*)         "          "  L3-4 L2, L3 (L4*)         "          "  L4-5 L3, L4 (L5*)         "          "  L5-S1 L4, L5, S1          "          "    Vitals:   09/15/23 0941 09/15/23 0946 09/15/23 0956 09/15/23 1006  BP: (!) 142/93 (!) 131/95 130/81 124/80  Pulse:      Resp: 16 16 14 13   Temp:      TempSrc:      SpO2: 100% 100% 98% 97%  Weight:      Height:        Start Time: 0912 hrs. End Time: 0945 hrs.  Imaging Guidance (Spinal):          Type of Imaging Technique: Fluoroscopy Guidance (Spinal) Indication(s): Fluoroscopy guidance for needle placement to enhance accuracy in procedures requiring precise needle localization for targeted delivery of medication in or near specific anatomical locations not easily accessible without such real-time imaging assistance. Exposure Time: Please see nurses notes. Contrast: None used. Fluoroscopic Guidance: I was personally present during the use of fluoroscopy. "Tunnel Vision Technique" used to obtain the best possible view of the target area. Parallax error corrected before commencing the procedure. "Direction-depth-direction" technique used to introduce the needle under continuous pulsed fluoroscopy. Once target was reached, antero-posterior, oblique, and lateral fluoroscopic projection used confirm needle placement in all planes. Images permanently stored  in EMR. Interpretation: No contrast injected. I personally interpreted the imaging intraoperatively. Adequate needle placement confirmed in multiple planes. Permanent images saved into the patient's record.  Antibiotic Prophylaxis:   Anti-infectives (From admission, onward)    None      Indication(s): None identified  Post-operative Assessment:  Post-procedure Vital Signs:  Pulse/HCG Rate: 7170 Temp: 97.8 F (36.6 C) Resp: 13 BP: 124/80 SpO2: 97 %  EBL: None  Complications: No immediate post-treatment complications observed by team, or reported by patient.  Note: The patient tolerated the entire procedure well. A repeat set of vitals were taken after the procedure and the patient was kept under observation following institutional policy, for this type of procedure. Post-procedural neurological assessment was performed, showing return to baseline, prior to discharge. The patient was provided with post-procedure discharge instructions, including a section on how to identify potential problems. Should any problems arise concerning this procedure, the patient was given instructions to immediately contact us , at any time, without hesitation. In any case, we plan to contact the patient by telephone for a follow-up status report regarding this interventional procedure.  Comments:  No additional relevant information.  Plan of Care (POC)  Orders:  Orders Placed This Encounter  Procedures   Radiofrequency,Lumbar    Scheduling Instructions:     Side(s): Left-sided     Level: L3-4, L4-5, and L5-S1 Facets (L2, L3, L4, L5, and S1 Medial Branch)     Sedation: With Sedation.     Timeframe: Today    Where will this procedure be performed?:  [facility]   DG [facility] C-ARM 1-60 MIN NO REPORT    Intraoperative interpretation by procedural physician at [facility].    Standing Status:   Standing    Number of Occurrences:   1    Reason for exam::   Assistance in needle  guidance and placement for procedures requiring needle placement in or near specific anatomical locations not easily accessible without such assistance.   Informed Consent Details: Physician/Practitioner Attestation; Transcribe to consent form and obtain patient signature    Nursing Order: Transcribe to consent form and obtain patient signature. Note: Always confirm laterality of pain with Ms. Iannelli, before procedure.    Physician/Practitioner attestation of informed consent for procedure/surgical case:   I, the physician/practitioner, attest that I have discussed with the patient the benefits, risks, side effects, alternatives, likelihood of achieving goals and potential problems during recovery for the procedure that I have provided informed consent.    Procedure:   Lumbar Facet Radiofrequency Ablation    Physician/Practitioner performing the procedure:   Mesa Janus A. Barth Borne, MD    Indication/Reason:   Low Back Pain, with our without leg pain, due to Facet Joint Arthralgia (Joint Pain) known as Lumbar Facet Syndrome, secondary to Lumbar, and/or Lumbosacral Spondylosis (Arthritis of the Spine), without myelopathy or radiculopathy (Nerve Damage).   Care order/instruction: Please confirm that the patient has stopped the Eliquis  (Apixaban ) x 3 days prior to procedure or surgery.    Please confirm that the patient has stopped the Eliquis  (Apixaban ) x 3 days prior to procedure or surgery.    Standing Status:   Standing    Number of Occurrences:   1   Provide equipment / supplies at bedside    Procedure tray: "Radiofrequency Tray" Additional material: Large hemostat (x1); Small hemostat (x1); Towels (x8); 4x4 sterile sponge pack (x1) Needle type: Teflon-coated Radiofrequency Needle (Disposable  single use) Size: Regular Quantity: 5    Standing Status:   Standing    Number of Occurrences:   1    Specify:   Radiofrequency Tray   Saline lock IV    Have LR [phone] mL available and administer at 125  mL/hr if patient becomes hypotensive.    Standing Status:   Standing    Number of Occurrences:   1   Bleeding precautions    Standing Status:   Standing    Number of Occurrences:   1   Chronic Opioid Analgesic:  No chronic opioid analgesics therapy prescribed by our practice. None MME/day: 0 mg/day   Medications ordered for procedure: Meds ordered this encounter  Medications   lidocaine  (XYLOCAINE ) 2 % (with pres) injection 400 mg   pentafluoroprop-tetrafluoroeth (GEBAUERS) aerosol   midazolam  (VERSED ) 5 MG/5ML injection 0.5-2 mg    Make sure Flumazenil is available in the pyxis when using this medication. If oversedation occurs, administer 0.2 mg IV over 15 sec. If after 45 sec no response, administer 0.2 mg again over 1 min; may repeat at 1 min intervals; not to exceed 4 doses (1 mg)   fentaNYL  (SUBLIMAZE ) injection 25-50 mcg    Make sure Narcan is available in the pyxis when using this medication. In the event of respiratory depression (RR< 8/min): Titrate NARCAN (naloxone) in increments of 0.1 to 0.2 mg IV at 2-3 minute intervals, until desired degree of reversal.   ropivacaine  (PF) 2 mg/mL (0.2%) (NAROPIN ) injection 10 mL   gabapentin (NEURONTIN) 100 MG capsule    Sig: Take 1-2 capsules (100-200 mg total)  by mouth at bedtime. Alternatively, take 1 cap PO BID    Dispense:  85 capsule    Refill:  0   tizanidine (ZANAFLEX) 2 MG capsule    Sig: Take 1 capsule (2 mg total) by mouth 3 (three) times daily as needed for up to 14 days for muscle spasms (for pain).    Dispense:  43 capsule    Refill:  0   traMADol  (ULTRAM ) 50 MG tablet    Sig: Take 1 tablet (50 mg total) by mouth every 6 (six) hours as needed for up to 7 days for severe pain (pain score 7-10). Must last 7 days.    Dispense:  28 tablet    Refill:  0    For acute post-operative pain. Not to be refilled.  Must last 7 days.   traMADol  (ULTRAM ) 50 MG tablet    Sig: Take 1 tablet (50 mg total) by mouth every 6 (six) hours as  needed for up to 7 days for severe pain (pain score 7-10). Must last 7 days.    Dispense:  28 tablet    Refill:  0    For acute post-operative pain. Not to be refilled.  Must last 7 days.   Medications administered: We administered lidocaine , pentafluoroprop-tetrafluoroeth, midazolam , fentaNYL , and ropivacaine  (PF) 2 mg/mL (0.2%).  See the medical record for exact dosing, route, and time of administration.  Follow-up plan:   Return in about 2 weeks (around 09/29/2023) for (ECT): (R) L-FCT RFA #1.       Interventional Therapies  Risk Factors  Considerations  Medical Comorbidities:  Eliquis  Anticoagulation: (Stop: 3 days  Restart: 6 hours)  A-Fib  BA  IDDM w/ hx. Coma  steroid-induced MD  Autoimmune hepatitis  liver cirrhosis  Hx. Portal Encephalopathy  Portal-HTN  GERD  Esophageal varices  Hx. Adrenal insuficiency  SLE (Lupus)  dilated cardiomyopathy  Thrombocytopoenia  BA     Planned  Pending:   Diagnostic bilateral lumbar facet (L2-S1) RFA #1 the patient has indicated being interested in starting with the left side.   Under consideration:   Therapeutic bilateral lumbar facet RFA #1 (starting with the left) Therapeutic L5-S1 Intracept procedure    Completed:   Diagnostic bilateral lumbar facet (L2-S1) MBB x2 (07/07/2023) (100/100/100/85)    Therapeutic  Palliative (PRN) options:   None established   Completed by other providers:   Therapeutic left sacroiliac joint inj. x3 (03/29/2015, 04/16/2020, 09/08/2022) by Herbert Loh, DO [facility])  Therapeutic knee injections by orthopedics Therapeutic shoulder injections by [facility]      Recent Visits Date Type Provider Dept  07/21/23 Office Visit Renaldo Caroli, MD [facility]  07/07/23 Procedure visit Renaldo Caroli, MD [facility]  06/23/23 Office Visit Renaldo Caroli, MD [facility]  Showing recent visits within past 90 days and meeting all  other requirements Today's Visits Date Type Provider Dept  09/15/23 Procedure visit Renaldo Caroli, MD [facility]  Showing today's visits and meeting all other requirements Future Appointments No visits were found meeting these conditions. Showing future appointments within next 90 days and meeting all other requirements  Disposition: Discharge home  Discharge (Date  Time): 09/15/2023; 1020 hrs.   Primary Care Physician: Britta Candy, MD Location: [facility] Note by: Candi Chafe, MD (TTS technology used. I apologize for any typographical errors that were not detected and corrected.) Date: 09/15/2023; Time: 10:17 AM  Disclaimer:  Medicine is not an  exact science. The only guarantee in medicine is that nothing is guaranteed. It is important to note that the decision to proceed with this intervention was based on the information collected from the patient. The Data and conclusions were drawn from the patient's questionnaire, the interview, and the physical examination. Because the information was provided in large part by the patient, it cannot be guaranteed that it has not been purposely or unconsciously manipulated. Every effort has been made to obtain as much relevant data as possible for this evaluation. It is important to note that the conclusions that lead to this procedure are derived in large part from the available data. Always take into account that the treatment will also be dependent on availability of resources and existing treatment guidelines, considered by other Pain Management Practitioners as being common knowledge and practice, at the time of the intervention. For Medico-Legal purposes, it is also important to point out that variation in procedural techniques and pharmacological choices are the acceptable norm. The indications, contraindications, technique, and results of the above procedure should only be interpreted and judged by a  Board-Certified Interventional Pain Specialist with extensive familiarity and expertise in the same exact procedure and technique.

## 2023-09-16 ENCOUNTER — Telehealth: Payer: Self-pay

## 2023-09-16 NOTE — Telephone Encounter (Signed)
 Post procedure follow up.  LM

## 2023-09-29 ENCOUNTER — Telehealth: Payer: Self-pay

## 2023-09-29 NOTE — Telephone Encounter (Signed)
 All questions answered. She wasn't sure why she could not stand up, states it wasn't from pain. I informed her it could be from the local anesthetic or from the inflammation caused by the procedure.  She reported symptoms were better at about 1 a.m. following the procedure.

## 2023-09-29 NOTE — Telephone Encounter (Signed)
 I called the patient to schedule her RFA on the opposite side and she said after her first one that no one told her how bad it would be. She couldn't walk, she had to be carried from her car. She wants to know if this is normal. Can someone call her to discuss?

## 2023-10-08 ENCOUNTER — Ambulatory Visit
Admission: EM | Admit: 2023-10-08 | Discharge: 2023-10-08 | Disposition: A | Discharge status: Home / Self Care | Attending: Emergency Medicine | Admitting: Emergency Medicine

## 2023-10-08 DIAGNOSIS — J22 Unspecified acute lower respiratory infection: Secondary | ICD-10-CM | POA: Diagnosis not present

## 2023-10-08 DIAGNOSIS — R0602 Shortness of breath: Secondary | ICD-10-CM | POA: Diagnosis not present

## 2023-10-08 DIAGNOSIS — R051 Acute cough: Secondary | ICD-10-CM | POA: Insufficient documentation

## 2023-10-08 MED ORDER — FLUCONAZOLE 150 MG PO TABS: ORAL_TABLET | ORAL | 0 refills | Status: AC

## 2023-10-08 MED ORDER — AMOXICILLIN-POT CLAVULANATE 875-125 MG PO TABS: 1.0000 | ORAL_TABLET | Freq: Two times a day (BID) | ORAL | 0 refills | Status: AC

## 2023-10-08 MED ORDER — DOXYCYCLINE HYCLATE 100 MG PO CAPS: 100.0000 mg | ORAL_CAPSULE | Freq: Two times a day (BID) | ORAL | 0 refills | Status: AC

## 2023-10-08 NOTE — Discharge Instructions (Addendum)
 Take antibiotic as directed(doxycycline  and Augmentin ) Also prescribed you Diflucan  as you requested for yeast treatment with antibiotics Drink plenty of water If you develop chest pain palpitations worsening shortness of breath please call 911 or go to the emergency room for further evaluation

## 2023-10-08 NOTE — ED Triage Notes (Signed)
 Pt c/o cough,congestion & sob x1 wk. Hx of asthma & CHF. Has tried inhaler w/o relief.

## 2023-10-08 NOTE — ED Provider Notes (Signed)
 [facility]    CSN: [id number] Arrival date & time: 10/08/23  1441      History   Chief Complaint Chief Complaint  Patient presents with   Cough   Shortness of Breath    HPI Shannon [name] is a 68 y.o. female.   68 year old female, Shannon [name], presents to urgent care for evaluation of cough, congestion and shortness of breath for 1 week.  Patient recently had iron infusion therapy last week. Pt has tried inhalers without relief.  Patient states she was recently around her family who may have been sick and did not tell her. Pt is eating well, voiding well.  PMH: Diabetes, iron deficiency, asthma, CHF, A fib, previous smoker  The history is provided by the patient. No language interpreter was used.    Past Medical History:  Diagnosis Date   Abdominal bloating 08/16/2013   Adjustment disorder 11/04/2010   Asthma    Atrial fibrillation with RVR (HCC)    as of 11/2021   Autoimmune hepatitis (HCC)    Bradycardia    Depression    Diabetes mellitus without complication (HCC)    Dilated cardiomyopathy (HCC)    DUB (dysfunctional uterine bleeding)    2008 s/p hysterectomy   Elevated liver enzymes 04/11/2020   Exposure to TB 2005   tx'ed x 6 months   Fatigue 01/23/2015   Gallstones    Gastric ulcer    GERD (gastroesophageal reflux disease)    HFrEF (heart failure with reduced ejection fraction) (HCC)    [facility] cards as of 11/2021   History of asthma 02/26/2018   History of chicken pox    Hx of migraines    Leukopenia 04/11/2020   Liver disease    very low liver function, auto-immune hepatitis   Motion sickness    Osteoarthritis    Systemic lupus erythematosus (HCC) 05/31/2010   Qualifier: Diagnosis of  By: Kirke Pepper MD, Talia     Thoracic radiculitis 03/05/2012   Vitamin D  deficiency     Patient Active Problem List   Diagnosis Date Noted   Acute respiratory infection 10/08/2023   Shortness of breath 10/08/2023   Lumbar facet joint syndrome 09/15/2023    Low back pain of over 3 months duration 09/15/2023   Mechanical low back pain 09/15/2023   Multifactorial low back pain 09/15/2023   Chronic systolic heart failure (HCC) 07/14/2023   Iron deficiency 07/14/2023   Chronic low back pain (Bilateral) w/o sciatica 06/09/2023   Vertebrogenic low back pain (L5-S1) 06/09/2023   Lumbar facet hypertrophy 06/09/2023   Lumbosacral facet hypertrophy 06/09/2023   Lumbar spinal stenosis, w/o neurogenic claudication 06/09/2023   Lumbar foraminal stenosis 06/09/2023   Spondylosis without myelopathy or radiculopathy, lumbosacral region 06/09/2023   Lumbar facet joint pain 05/19/2023   Lower extremity weakness (Left) 05/11/2023   Frequent falls 05/11/2023   Chronic anticoagulation (Eliquis ) 04/20/2023   Abnormal MRI, lumbar spine (12/16/2022) ([facility]) 04/20/2023   Lumbar Grade 1 Anterolisthesis of L3/L4 & L4/L5 04/20/2023   Chronic lower extremity pain (2ry area of Pain) (Bilateral) (R>L) 04/20/2023   Chronic low back pain (1ry area of Pain) (Bilateral) w/ sciatica (Bilateral) 04/20/2023   Long term (current) use of systemic steroids 04/20/2023   Pharmacologic therapy 04/19/2023   Disorder of skeletal system 04/19/2023   Problems influencing health status 04/19/2023   Chronic cholecystitis 10/30/2022   Constipation 09/15/2022   Thrombocytopenia, unspecified (HCC) 08/27/2022   Depression 08/24/2022   Secondary esophageal varices without bleeding (HCC) 03/13/2022  Dilated cardiomyopathy (HCC) 12/30/2021   Myalgia due to statin 12/27/2021   Low serum vitamin B12 12/27/2021   Bradycardia 12/08/2021   Symptomatic PVCs 12/08/2021   HFrEF (heart failure with reduced ejection fraction) (HCC) 11/13/2021   Atrial fibrillation with RVR (HCC) 11/08/2021   Acute pulmonary edema (HCC) 11/06/2021   Anemia 11/06/2021   Diarrhea 11/06/2021   Hypomagnesemia 11/06/2021   Pleural effusion 11/06/2021   Abdominal pain 11/06/2021   Elevated troponin 11/06/2021    Portal hypertension (HCC) 09/03/2021   Anxiety 03/27/2021   Diabetes mellitus due to underlying condition with hyperosmolarity without coma, without long-term current use of insulin  (HCC) 04/23/2020   Pancreas cyst 03/02/2020   Adrenal insufficiency due to corticosteroid withdrawal (HCC) 01/02/2020   Hirsutism 09/27/2019   Primary osteoarthritis of shoulders (Bilateral) 09/27/2019   Type 2 diabetes mellitus without complication, with long-term current use of insulin  (HCC) 03/25/2019   Calculus of gallbladder without cholecystitis without obstruction 10/05/2018   Mild intermittent asthma with exacerbation 04/07/2018   Seronegative spondylitis (HCC) 03/10/2018   History of colon polyps 03/03/2018   Cirrhosis of liver (HCC) 02/26/2018   Sacroiliac joint pain 02/26/2018   Arthritis 02/26/2018   Chronic pain syndrome 02/26/2018   Bronchitis 08/23/2017   Portal hypertensive gastropathy (HCC) 01/23/2015   HLD (hyperlipidemia) 11/23/2013   Tobacco abuse 11/23/2013   Acute cough 12/23/2012   Lumbosacral radiculopathy at L5 03/26/2012   Osteoarthritis of knee 03/26/2012   Candidiasis 10/08/2011   Hyperpigmentation 11/04/2010   Dyschromia 11/04/2010   Steroid-induced diabetes (HCC) 05/31/2010   Vitamin D  deficiency 05/31/2010   Systemic lupus erythematosus (HCC) 05/31/2010   Disorder of pancreatic internal secretion 05/31/2010   Encephalopathy, portal systemic (HCC) 05/07/2010   Autoimmune hepatitis (HCC) 03/19/2010    Past Surgical History:  Procedure Laterality Date   ABDOMINAL HYSTERECTOMY     BACK SURGERY     L4/5 herniated disc repair 1992    HALLUX VALGUS AUSTIN Left 02/09/2019   Procedure: HALLUX VALGUS AUSTIN (MITCHELL);  Surgeon: Anell Baptist, DPM;  Location: [facility];  Service: Podiatry;  Laterality: Left;  General with local   HAMMER TOE SURGERY Left 02/09/2019   Procedure: HAMMER TOE CORRECTION;  Surgeon: Anell Baptist, DPM;  Location: [facility];   Service: Podiatry;  Laterality: Left;  Diabetic - oral meds   LAPAROSCOPIC HYSTERECTOMY     TUBAL LIGATION      OB History   No obstetric history on file.      Home Medications    Prior to Admission medications   Medication Sig Start Date End Date Taking? Authorizing Provider  acetaminophen  (TYLENOL ) 650 MG CR tablet Take by mouth.   Yes [provider]  albuterol  (VENTOLIN  HFA) 108 (90 Base) MCG/ACT inhaler USE 1 TO 2 INHALATIONS BY  MOUTH INTO THE LUNGS EVERY  6 HOURS AS NEEDED FOR  WHEEZING OR SHORTNESS OF  BREATH 10/02/21  Yes McLean-Scocuzza, Karon Packer, MD  amoxicillin -clavulanate (AUGMENTIN ) 875-125 MG tablet Take 1 tablet by mouth every 12 (twelve) hours for 5 days. 10/08/23 10/13/23 Yes Joaopedro Eschbach, Eveleen Hinds, NP  apixaban  (ELIQUIS ) 5 MG TABS tablet Take 1 tablet (5 mg total) by mouth 2 (two) times daily. 12/18/21  Yes Valli Gaw, MD  azaTHIOprine  (IMURAN ) 50 MG tablet Take 4 tablets (200 mg total) by mouth daily. 12/18/21  Yes Valli Gaw, MD  blood glucose meter kit and supplies KIT Dispense based on patient and insurance preference. Use up to four times daily as directed. (FOR ICD-9 250.00,  250.01). 01/02/20  Yes Rhina Center, NP  blood glucose meter kit and supplies Dispense based on patient and insurance preference. Use up to four times daily as directed. (FOR ICD-10 E10.9, E11.9). 01/02/20  Yes Rhina Center, NP  Blood Glucose Monitoring Suppl (ONE TOUCH ULTRA SYSTEM KIT) W/DEVICE KIT Test blood sugar as directed Dx E09.9 04/28/14  Yes Aron, Talia M, MD  celecoxib  (CELEBREX ) 100 MG capsule Take 100 mg by mouth 2 (two) times a week. 01/03/22  Yes [provider]  Cholecalciferol (VITAMIN D3) 5000 units TABS Take by mouth.    Yes [provider]  doxycycline  (VIBRAMYCIN ) 100 MG capsule Take 1 capsule (100 mg total) by mouth 2 (two) times daily for 5 days. 10/08/23 10/13/23 Yes Larysa Pall, Eveleen Hinds, NP  fluconazole  (DIFLUCAN ) 150 MG tablet Take 1 tab po  today,repeat day 3, day 7 10/08/23  Yes Anatasia Tino, Eveleen Hinds, NP  furosemide  (LASIX ) 40 MG tablet Take 1 tablet (40 mg total) by mouth daily. 12/18/21  Yes Valli Gaw, MD  gabapentin  (NEURONTIN ) 100 MG capsule Take 1-2 capsules (100-200 mg total) by mouth at bedtime. Alternatively, take 1 cap PO BID 09/15/23 10/30/23 Yes Naveira, Francisco, MD  glucose blood (ONETOUCH VERIO) test strip 1 each by Other route 4 (four) times daily. Use as instructed 05/15/20  Yes McLean-Scocuzza, Karon Packer, MD  insulin  degludec (TRESIBA  FLEXTOUCH) 100 UNIT/ML FlexTouch Pen Inject 10 Units into the skin daily. 04/24/21  Yes McLean-Scocuzza, Karon Packer, MD  Insulin  Pen Needle (PEN NEEDLES) 31G X 6 MM MISC Use with Tresiba  as instructed daily 09/13/20  Yes McLean-Scocuzza, Karon Packer, MD  lactulose (CHRONULAC) 10 GM/15ML solution TAKE 30 ML BY MOUTH 4 TIMES DAILY AS NEEDED Patient taking differently: Take 30 g by mouth. 50-60 g 1x per day not 30 g qid 09/26/14  Yes Aron, Talia M, MD  lidocaine  (LIDODERM ) 5 % Place 1 patch onto the skin daily. Remove & Discard patch within 12 hours or as directed by MD 09/22/12  Yes Copland, Jolena Nay, MD  loratadine (CLARITIN) 10 MG tablet Take 10 mg by mouth every other day as needed for allergies.   Yes [provider]  losartan (COZAAR) 25 MG tablet Take 1 tablet by mouth daily. 02/17/22  Yes [provider]  Magnesium Oxide 500 MG TABS Take by mouth.   Yes [provider]  metFORMIN  (GLUCOPHAGE -XR) 500 MG 24 hr tablet TAKE 2 TABLETS BY MOUTH TWICE  DAILY WITH A MEAL 01/07/22  Yes Webb, Padonda B, FNP  metoprolol  tartrate (LOPRESSOR ) 50 MG tablet Take 1 tablet (50 mg total) by mouth 2 (two) times daily. 12/18/21  Yes Valli Gaw, MD  miconazole  (MICONAZOLE  3) 200 MG vaginal suppository Place 1 suppository (200 mg total) vaginally at bedtime. 12/18/21  Yes Valli Gaw, MD  omeprazole  (PRILOSEC) 40 MG capsule TAKE 1 CAPSULE BY MOUTH  DAILY 1/2 HOUR BEFORE A  MEAL 09/27/21  Yes  McLean-Scocuzza, Karon Packer, MD  Simethicone 180 MG CAPS Take by mouth.   Yes [provider]  spironolactone  (ALDACTONE ) 25 MG tablet Take 1 tablet (25 mg total) by mouth daily. 12/18/21  Yes Valli Gaw, MD  traZODone (DESYREL) 50 MG tablet Take 25 mg by mouth at bedtime as needed. 11/13/21  Yes [provider]  vitamin B-12 (CYANOCOBALAMIN ) 100 MCG tablet Take 100 mcg by mouth daily.   Yes [provider]  nystatin  (MYCOSTATIN ) 100000 UNIT/ML suspension Take 5 mLs (500,000 Units total) by mouth 4 (four) times daily. Hold and  spit 05/08/22   Nancy Axon B, PA-C  tizanidine  (ZANAFLEX ) 2 MG capsule Take 1 capsule (2 mg total) by mouth 3 (three) times daily as needed for up to 14 days for muscle spasms (for pain). 09/15/23 09/29/23  Renaldo Caroli, MD  traMADol  (ULTRAM ) 50 MG tablet Take 1 tablet (50 mg total) by mouth every 6 (six) hours as needed for up to 7 days for severe pain (pain score 7-10). Must last 7 days. 09/15/23 09/22/23  Renaldo Caroli, MD  traMADol  (ULTRAM ) 50 MG tablet Take 1 tablet (50 mg total) by mouth every 6 (six) hours as needed for up to 7 days for severe pain (pain score 7-10). Must last 7 days. 09/22/23 09/29/23  Renaldo Caroli, MD    Family History Family History  Problem Relation Age of Onset   Cancer Mother        lung cancer smoker   Heart disease Father    Hypertension Father    Hypertension Sister    Sarcoidosis Sister        lung transplant failure   Heart disease Brother    Alcohol abuse Brother    Diabetes Sister    Diabetes Sister    Kidney disease Sister    Hypertension Sister    Rheum arthritis Sister    Diabetes Son    Hypertension Son     Social History Social History   Tobacco Use   Smoking status: Former    Current packs/day: 0.50    Average packs/day: 0.5 packs/day for 30.0 years (15.0 ttl pk-yrs)    Types: Cigarettes   Smokeless tobacco: Never   Tobacco comments:    Aware of need to quit  has decreased  number of cigarettes per day. Has reached out to [facility] and has patches. No quit date yet.   Vaping Use   Vaping status: Never Used  Substance Use Topics   Alcohol use: No   Drug use: No     Allergies   Hydrocodone , Lipitor [atorvastatin  calcium ], Milk-related compounds, Oxycodone, Wellbutrin [bupropion], and Adhesive [tape]   Review of Systems Review of Systems  Constitutional:  Positive for fever.  HENT:  Positive for congestion.   Respiratory:  Positive for cough and shortness of breath.   Cardiovascular:  Negative for chest pain and palpitations.  All other systems reviewed and are negative.    Physical Exam Triage Vital Signs ED Triage Vitals  Encounter Vitals Group     BP 10/08/23 1456 120/78     Systolic BP Percentile --      Diastolic BP Percentile --      Pulse Rate 10/08/23 1456 78     Resp 10/08/23 1456 20     Temp 10/08/23 1456 99.5 F (37.5 C)     Temp Source 10/08/23 1456 Oral     SpO2 10/08/23 1456 98 %     Weight 10/08/23 1455 177 lb 9.6 oz (80.6 kg)     Height --      Head Circumference --      Peak Flow --      Pain Score 10/08/23 1458 0     Pain Loc --      Pain Education --      Exclude from Growth Chart --    No data found.  Updated Vital Signs BP 120/78 (BP Location: Left Arm)   Pulse 78   Temp 99.5 F (37.5 C) (Oral)   Resp 20   Wt 177 lb 9.6 oz (80.6  kg)   SpO2 98%   BMI 29.55 kg/m   Visual Acuity Right Eye Distance:   Left Eye Distance:   Bilateral Distance:    Right Eye Near:   Left Eye Near:    Bilateral Near:     Physical Exam Vitals and nursing note reviewed.  Constitutional:      General: She is not in acute distress.    Appearance: She is well-developed and well-groomed.  HENT:     Head: Normocephalic.     Right Ear: Tympanic membrane is retracted.     Left Ear: Tympanic membrane is retracted.     Nose: Mucosal edema and congestion present.     Right Sinus: Maxillary sinus tenderness present.     Left  Sinus: Maxillary sinus tenderness present.     Mouth/Throat:     Lips: Pink.     Mouth: Mucous membranes are moist.     Pharynx: Oropharynx is clear. Postnasal drip present.  Eyes:     General: Lids are normal.     Conjunctiva/sclera: Conjunctivae normal.     Pupils: Pupils are equal, round, and reactive to light.  Neck:     Trachea: No tracheal deviation.  Cardiovascular:     Rate and Rhythm: Normal rate and regular rhythm.     Heart sounds: Normal heart sounds. No murmur heard.    Comments: HR 78 and regular Pulmonary:     Effort: Pulmonary effort is normal. Tachypnea present.     Breath sounds: No stridor. No wheezing.     Comments: Coarse throughout Abdominal:     General: Bowel sounds are normal.     Palpations: Abdomen is soft.     Tenderness: There is no abdominal tenderness.  Musculoskeletal:        General: Normal range of motion.     Cervical back: Normal range of motion.  Lymphadenopathy:     Cervical: No cervical adenopathy.  Skin:    General: Skin is warm and dry.     Findings: No rash.  Neurological:     General: No focal deficit present.     Mental Status: She is alert and oriented to person, place, and time.     GCS: GCS eye subscore is 4. GCS verbal subscore is 5. GCS motor subscore is 6.  Psychiatric:        Attention and Perception: Attention normal.        Mood and Affect: Mood normal.        Speech: Speech normal.        Behavior: Behavior normal. Behavior is cooperative.      UC Treatments / Results  Labs (all labs ordered are listed, but only abnormal results are displayed) Labs Reviewed - No data to display  EKG   Radiology No results found.  Procedures Procedures (including critical care time)  Medications Ordered in UC Medications - No data to display  Initial Impression / Assessment and Plan / UC Course  I have reviewed the triage vital signs and the nursing notes.  Pertinent labs & imaging results that were available during  my care of the patient were reviewed by me and considered in my medical decision making (see chart for details).    Discussed exam findings and plan of care with patient, will treat with doxycycline  and Augmentin  to cover respiratory infection upper and lower , pt requesting diflucan  as abx give her a yeast infection, will script. strict go to ER precautions given.   Patient  verbalized understanding to this provider.  Ddx: Acute respiratory infection, acute cough, SOB, pneumonia, sinusitis,  viral illness, allergies Final Clinical Impressions(s) / UC Diagnoses   Final diagnoses:  Acute respiratory infection  Acute cough  Shortness of breath     Discharge Instructions      Take antibiotic as directed(doxycycline  and Augmentin ) Also prescribed you Diflucan  as you requested for yeast treatment with antibiotics Drink plenty of water If you develop chest pain palpitations worsening shortness of breath please call 911 or go to the emergency room for further evaluation  ED Prescriptions     Medication Sig Dispense Auth. Provider   doxycycline  (VIBRAMYCIN ) 100 MG capsule Take 1 capsule (100 mg total) by mouth 2 (two) times daily for 5 days. 10 capsule Shannon Ryle, NP   amoxicillin -clavulanate (AUGMENTIN ) 875-125 MG tablet Take 1 tablet by mouth every 12 (twelve) hours for 5 days. 10 tablet Shannon Triplett, NP   fluconazole  (DIFLUCAN ) 150 MG tablet Take 1 tab po today,repeat day 3, day 7 3 tablet Shannon Mcnew, NP      PDMP not reviewed this encounter.   Peter Brands, NP 10/08/23 [phone]

## 2023-10-20 ENCOUNTER — Ambulatory Visit: Attending: Pain Medicine | Admitting: Pain Medicine

## 2023-10-20 ENCOUNTER — Encounter: Payer: Self-pay | Admitting: Pain Medicine

## 2023-10-20 VITALS — BP 120/79 | HR 69 | Temp 97.5°F | Resp 18 | Ht 65.0 in | Wt 170.0 lb

## 2023-10-20 DIAGNOSIS — M545 Low back pain, unspecified: Secondary | ICD-10-CM | POA: Insufficient documentation

## 2023-10-20 DIAGNOSIS — M5459 Other low back pain: Secondary | ICD-10-CM | POA: Diagnosis present

## 2023-10-20 DIAGNOSIS — M791 Myalgia, unspecified site: Secondary | ICD-10-CM | POA: Diagnosis not present

## 2023-10-20 DIAGNOSIS — G8929 Other chronic pain: Secondary | ICD-10-CM | POA: Diagnosis present

## 2023-10-20 DIAGNOSIS — M549 Dorsalgia, unspecified: Secondary | ICD-10-CM | POA: Diagnosis present

## 2023-10-20 DIAGNOSIS — Z7901 Long term (current) use of anticoagulants: Secondary | ICD-10-CM | POA: Diagnosis present

## 2023-10-20 MED ORDER — PENTAFLUOROPROP-TETRAFLUOROETH EX AERO: INHALATION_SPRAY | Freq: Once | CUTANEOUS | Status: DC

## 2023-10-20 MED ORDER — MIDAZOLAM HCL 5 MG/5ML IJ SOLN
INTRAMUSCULAR | Status: AC
  Filled 2023-10-20: qty 5

## 2023-10-20 MED ORDER — ROPIVACAINE HCL 2 MG/ML IJ SOLN
INTRAMUSCULAR | Status: AC
  Filled 2023-10-20: qty 20

## 2023-10-20 MED ORDER — MIDAZOLAM HCL 5 MG/5ML IJ SOLN
0.5000 mg | Freq: Once | INTRAMUSCULAR | Status: AC
  Administered 2023-10-20: 2 mg via INTRAVENOUS

## 2023-10-20 MED ORDER — FENTANYL CITRATE (PF) 100 MCG/2ML IJ SOLN
INTRAMUSCULAR | Status: AC
Start: 2023-10-20 — End: ?
  Filled 2023-10-20: qty 2

## 2023-10-20 MED ORDER — LIDOCAINE HCL 2 % IJ SOLN
INTRAMUSCULAR | Status: AC
  Filled 2023-10-20: qty 20

## 2023-10-20 MED ORDER — LIDOCAINE HCL 2 % IJ SOLN
10.0000 mL | Freq: Once | INTRAMUSCULAR | Status: AC
  Administered 2023-10-20: 400 mg

## 2023-10-20 MED ORDER — TRIAMCINOLONE ACETONIDE 40 MG/ML IJ SUSP
INTRAMUSCULAR | Status: AC
Start: 2023-10-20 — End: ?
  Filled 2023-10-20: qty 1

## 2023-10-20 MED ORDER — ROPIVACAINE HCL 2 MG/ML IJ SOLN
9.0000 mL | Freq: Once | INTRAMUSCULAR | Status: AC
  Administered 2023-10-20: 9 mL via PERINEURAL

## 2023-10-20 NOTE — Progress Notes (Signed)
 PROVIDER NOTE: Interpretation of information contained herein should be left to medically-trained personnel. Specific patient instructions are provided elsewhere under "Patient Instructions" section of medical record. This document was created in part using STT-dictation technology, any transcriptional errors that may result from this process are unintentional.  Patient: Shannon [name] Type: Established DOB: Sep 06, 1955 MRN: [id number] PCP: Britta Candy, MD  Service: Procedure DOS: 10/20/2023 Setting: Ambulatory Location: Ambulatory outpatient facility Delivery: Face-to-face Provider: Candi Chafe, MD Specialty: Interventional Pain Management Specialty designation: 09 Location: Outpatient facility Ref. Prov.: Britta Candy, MD       Interventional Therapy   Type:  ESM & QLM Trigger Point Injection (Myoneural Block) (1-2 muscle groups)  #1 (w/o steroids)  CPT: 20552 Laterality: Left (-LT)   Imaging: N/A. Landmark-guided",           Anesthesia: Local anesthesia (1-2% Lidocaine ) Anxiolysis: IV Versed  2.0 mg Sedation: No Sedation None required. No Fentanyl  administered.         DOS: 10/20/2023  Performed by: Candi Chafe, MD  Medical Necessity (reasoning)  Purpose: Diagnostic/Therapeutic Rationale (medical necessity): procedure needed and proper for the diagnosis and/or treatment of Shannon [name]'s medical symptoms and needs. Indications: Left low back pain severe enough to impact quality of life and/or function. 1. Chronic low back pain (Bilateral) w/o sciatica   2. Trigger point with back pain   3. Trigger point   4. Lumbar trigger point syndrome   5. Mechanical low back pain   6. Multifactorial low back pain   7. Low back pain of over 3 months duration   8. Chronic anticoagulation (Eliquis )    NAS-11 Pain score:   Pre-procedure: 5 /10   Post-procedure: 0-No pain/10       Muscle: Left erector spinae muscle and quadratus lumborum muscle Target: Myoneural mass  of trigger point. Region: Lower back Anatomic Surface: Posterior Anatomic Area: Lumbosacral Level: PSIS  Approach: Percutaneous  Type of procedure: Myoneural injection   Position  Prep  Materials  Position: Prone. Patient assisted into a comfortable position. Pressure points checked.  Prep solution: ChloraPrep (2% chlorhexidine gluconate and 70% isopropyl alcohol) The target area was identified and the area prepped in the usual manner.  Prep Area: Left posterior lumbosacral region  Materials:   Tray: Block Needle(s):  Type: Regular  Gauge (G): 25  Length: 1.5-in  Qty: 1  H&P (Pre-op Assessment):  Shannon [name] is a 68 y.o. (year old), female patient, seen today for interventional treatment. She  has a past surgical history that includes Laparoscopic hysterectomy; Tubal ligation; Back surgery; Abdominal hysterectomy; Hallux valgus austin (Left, 02/09/2019); and Hammer toe surgery (Left, 02/09/2019). Shannon [name] has a current medication list which includes the following prescription(s): acetaminophen , albuterol , apixaban , azathioprine , blood glucose meter kit and supplies, blood glucose meter kit and supplies, one touch ultra system kit, celecoxib , vitamin d3, fluconazole , furosemide , gabapentin , onetouch verio, tresiba  flextouch, pen needles, lactulose, lidocaine , loratadine, losartan, magnesium oxide -mg supplement, metformin , metoprolol  tartrate, miconazole  3, nystatin , omeprazole , pantoprazole , prednisone , simethicone, spironolactone , trazodone, cyanocobalamin , tizanidine , tramadol , and tramadol , and the following Facility-Administered Medications: pentafluoroprop-tetrafluoroeth. Her primarily concern today is the Back Pain (Left)  Initial Vital Signs:  Pulse/HCG Rate: 69ECG Heart Rate: 70 Temp: (!) 97.3 F (36.3 C) Resp: 16 BP: 131/80 SpO2: 100 %  BMI: Estimated body mass index is 28.29 kg/m as calculated from the following:   Height as of this encounter: 5\' 5"  (1.651 m).   Weight  as of this encounter: 170 lb (77.1 kg).  Risk Assessment: Allergies: Reviewed. She is allergic to hydrocodone , lipitor [atorvastatin  calcium ], milk-related compounds, oxycodone, wellbutrin [bupropion], and adhesive [tape].  Allergy Precautions: None required Coagulopathies: Reviewed. None identified.  Blood-thinner therapy: None at this time Active Infection(s): Reviewed. None identified. Shannon [name] is afebrile  Site Confirmation: Shannon [name] was asked to confirm the procedure and laterality before marking the site Procedure checklist: Completed Consent: Before the procedure and under the influence of no sedative(s), amnesic(s), or anxiolytics, the patient was informed of the treatment options, risks and possible complications. To fulfill our ethical and legal obligations, as recommended by the American Medical Association's Code of Ethics, I have informed the patient of my clinical impression; the nature and purpose of the treatment or procedure; the risks, benefits, and possible complications of the intervention; the alternatives, including doing nothing; the risk(s) and benefit(s) of the alternative treatment(s) or procedure(s); and the risk(s) and benefit(s) of doing nothing. The patient was provided information about the general risks and possible complications associated with the procedure. These may include, but are not limited to: failure to achieve desired goals, infection, bleeding, organ or nerve damage, allergic reactions, paralysis, and death. In addition, the patient was informed of those risks and complications associated to the procedure, such as failure to decrease pain; infection; bleeding; organ or nerve damage with subsequent damage to sensory, motor, and/or autonomic systems, resulting in permanent pain, numbness, and/or weakness of one or several areas of the body; allergic reactions; (i.e.: anaphylactic reaction); and/or death. Furthermore, the patient was informed of those  risks and complications associated with the medications. These include, but are not limited to: allergic reactions (i.e.: anaphylactic or anaphylactoid reaction(s)); adrenal axis suppression; blood sugar elevation that in diabetics may result in ketoacidosis or comma; water retention that in patients with history of congestive heart failure may result in shortness of breath, pulmonary edema, and decompensation with resultant heart failure; weight gain; swelling or edema; medication-induced neural toxicity; particulate matter embolism and blood vessel occlusion with resultant organ, and/or nervous system infarction; and/or aseptic necrosis of one or more joints. Finally, the patient was informed that Medicine is not an exact science; therefore, there is also the possibility of unforeseen or unpredictable risks and/or possible complications that may result in a catastrophic outcome. The patient indicated having understood very clearly. We have given the patient no guarantees and we have made no promises. Enough time was given to the patient to ask questions, all of which were answered to the patient's satisfaction. Ms. Callaway has indicated that she wanted to continue with the procedure. Attestation: I, the ordering provider, attest that I have discussed with the patient the benefits, risks, side-effects, alternatives, likelihood of achieving goals, and potential problems during recovery for the procedure that I have provided informed consent. Date  Time:    Pre-Procedure Preparation:  Monitoring: As per clinic protocol. Respiration, ETCO2, SpO2, BP, heart rate and rhythm monitor placed and checked for adequate function Safety Precautions: Patient was assessed for positional comfort and pressure points before starting the procedure. Time-out: I initiated and conducted the "Time-out" before starting the procedure, as per protocol. The patient was asked to participate by confirming the accuracy of the "Time  Out" information. Verification of the correct person, site, and procedure were performed and confirmed by me, the nursing staff, and the patient. "Time-out" conducted as per Joint Commission's Universal Protocol (UP.01.01.01). Time: 1101 Start Time: 1101 hrs.  Narrative  Start Time: 1101 hrs.  Standard Safety Precautions: Protocol guidelines were followed. Aspiration was conducted prior to injection. At no point did I inject any substances, as a needle was being advanced. No attempts were made at seeking a paresthesia. Safe injection practices and needle disposal techniques used. Medications properly checked for expiration dates. SDV (single dose vial) medications used.  Local Anesthesia: Skin & deeper tissues infiltrated with local anesthetic. Appropriate amount of time allowed for local anesthetics to take effect.   Technical description:  The target area was identified and the area prepped in the usual manner. The procedure needles were then advanced to the target area. Proper needle placement secured. Negative aspiration confirmed. Solution injected in intermittent fashion, asking for systemic symptoms every 0.5cc of injectate. The needles were then removed and the area cleansed, making sure to leave some of the prepping solution back to take advantage of its long term bactericidal properties.  Vitals:   10/20/23 1053 10/20/23 1059 10/20/23 1105 10/20/23 1110  BP: (!) 126/94 111/87 124/73 120/79  Pulse:      Resp: 20 (!) 25 (!) 22 18  Temp:    (!) 97.5 F (36.4 C)  TempSrc:    Temporal  SpO2: 100% 100% 100% 100%  Weight:      Height:         End Time: 1103 hrs.  Imaging Guidance                Type of Imaging Technique: None used Indication(s): N/A Exposure Time: No patient exposure Contrast: None used. Fluoroscopic Guidance: N/A Ultrasound Guidance: N/A Interpretation: N/A   Post-operative Assessment:  Post-procedure Vital Signs:  Pulse/HCG Rate: 6970 Temp:  (!) 97.5 F (36.4 C) Resp: 18 BP: 120/79 SpO2: 100 %  EBL: None  Complications: No immediate post-treatment complications observed by team, or reported by patient.  Note: The patient tolerated the entire procedure well. A repeat set of vitals were taken after the procedure and the patient was kept under observation following institutional policy, for this type of procedure. Post-procedural neurological assessment was performed, showing return to baseline, prior to discharge. The patient was provided with post-procedure discharge instructions, including a section on how to identify potential problems. Should any problems arise concerning this procedure, the patient was given instructions to immediately contact us , at any time, without hesitation. In any case, we plan to contact the patient by telephone for a follow-up status report regarding this interventional procedure.  Comments:  No additional relevant information.   Plan of Care (POC)  Orders:  Orders Placed This Encounter  Procedures   TRIGGER POINT INJECTION    Scheduling Instructions:     Area: Lower Back     Side: Right     Sedation: With Sedation.     Timeframe: 10/20/2023    Where will this procedure be performed?:   [facility]   Blood Thinner Instructions to Nursing    Always make sure patient has clearance from prescribing physician to stop blood thinners for interventional therapies. If the patient requires a Lovenox-bridge therapy, make sure arrangements are made to institute it with the assistance of the PCP.    Scheduling Instructions:     Have Ms. Sevin stop the Eliquis  (Apixaban ) x 3 days prior to procedure or surgery.   Care order/instruction: Please confirm that the patient has stopped the Eliquis  (Apixaban ) x 3 days prior to procedure or surgery.    Please confirm that the patient has stopped the Eliquis  (Apixaban ) x  3 days prior to procedure or surgery.    Standing Status:   Standing    Number of  Occurrences:   1   Provide equipment / supplies at bedside    Procedure tray: "Procedure Tray" Additional material: Large hemostat (x1); Small hemostat (x1); Towels (x8); 4x4 sterile sponge pack (x1) Needle type: regular 1.5" needle Size: 1.5-inch Quantity: 1    Standing Status:   Standing    Number of Occurrences:   1    Specify:   Radiofrequency Tray   Informed Consent Details: Physician/Practitioner Attestation; Transcribe to consent form and obtain patient signature    Provider Attestation: I, Carney Saxton A. Barth Borne, MD, (Pain Management Specialist), the physician/practitioner, attest that I have discussed with the patient the benefits, risks, side effects, alternatives, likelihood of achieving goals and potential problems during recovery for the procedure that I have provided informed consent.    Scheduling Instructions:     Note: Always confirm laterality of pain with Ms. Rios, before procedure.     Transcribe to consent form and obtain patient signature.    Physician/Practitioner attestation of informed consent for procedure/surgical case:   I, the physician/practitioner, attest that I have discussed with the patient the benefits, risks, side effects, alternatives, likelihood of achieving goals and potential problems during recovery for the procedure that I have provided informed consent.    Procedure:   Myoneural Block (Trigger Point injection)    Physician/Practitioner performing the procedure:   Janice Seales A. Barth Borne MD    Indication/Reason:   Musculoskeletal pain/myofascial pain secondary to trigger point   Saline lock IV    Have LR [phone] available and administer at 125 mL/hr if patient becomes hypotensive.    Standing Status:   Standing    Number of Occurrences:   1   Bleeding precautions    Standing Status:   Standing    Number of Occurrences:   1     Opioid Analgesic(s): No chronic opioid analgesics therapy prescribed by our practice. None MME/day: 0 mg/day     Medications ordered for procedure: Meds ordered this encounter  Medications   lidocaine  (XYLOCAINE ) 2 % (with pres) injection 200 mg   pentafluoroprop-tetrafluoroeth (GEBAUERS) aerosol   midazolam  (VERSED ) 5 MG/5ML injection 0.5-2 mg    Make sure Flumazenil is available in the pyxis when using this medication. If oversedation occurs, administer 0.2 mg IV over 15 sec. If after 45 sec no response, administer 0.2 mg again over 1 min; may repeat at 1 min intervals; not to exceed 4 doses (1 mg)   ropivacaine  (PF) 2 mg/mL (0.2%) (NAROPIN ) injection 9 mL   Medications administered: We administered lidocaine , midazolam , and ropivacaine  (PF) 2 mg/mL (0.2%).  See the medical record for exact dosing, route, and time of administration.    Interventional Therapies  Risk Factors  Considerations  Medical Comorbidities:  Eliquis  Anticoagulation: (Stop: 3 days  Restart: 6 hours)  A-Fib  BA  IDDM w/ hx. Coma  steroid-induced MD  Autoimmune hepatitis  liver cirrhosis  Hx. Portal Encephalopathy  Portal-HTN  GERD  Esophageal varices  Hx. Adrenal insuficiency  SLE (Lupus)  dilated cardiomyopathy  Thrombocytopoenia  BA     Planned  Pending:   Diagnostic bilateral lumbar facet (L2-S1) RFA #1 the patient has indicated being interested in starting with the left side.   Under consideration:   Therapeutic bilateral lumbar facet RFA #1 (starting with the left) Therapeutic L5-S1 Intracept procedure    Completed:   Diagnostic bilateral lumbar facet (L2-S1)  MBB x2 (07/07/2023) (100/100/100/85)    Therapeutic  Palliative (PRN) options:   None established   Completed by other providers:   Therapeutic left sacroiliac joint inj. x3 (03/29/2015, 04/16/2020, 09/08/2022) by Herbert Loh, DO [facility])  Therapeutic knee injections by orthopedics Therapeutic shoulder injections by [facility]      Follow-up plan:   Return in about 2 weeks (around 11/03/2023) for (Face2F),  (PPE).     Recent Visits Date Type Provider Dept  09/15/23 Procedure visit Renaldo Caroli, MD [facility]  Showing recent visits within past 90 days and meeting all other requirements Today's Visits Date Type Provider Dept  10/20/23 Procedure visit Renaldo Caroli, MD [facility]  Showing today's visits and meeting all other requirements Future Appointments Date Type Provider Dept  11/04/23 Appointment Renaldo Caroli, MD [facility]  Showing future appointments within next 90 days and meeting all other requirements   Disposition: Discharge home  Discharge (Date  Time): 10/20/2023; 1114 hrs.   Primary Care Physician: Britta Candy, MD Location: [facility] Note by: Candi Chafe, MD (TTS technology used. I apologize for any typographical errors that were not detected and corrected.) Date: 10/20/2023; Time: 12:48 PM  Disclaimer:  Medicine is not an Visual merchandiser. The only guarantee in medicine is that nothing is guaranteed. It is important to note that the decision to proceed with this intervention was based on the information collected from the patient. The Data and conclusions were drawn from the patient's questionnaire, the interview, and the physical examination. Because the information was provided in large part by the patient, it cannot be guaranteed that it has not been purposely or unconsciously manipulated. Every effort has been made to obtain as much relevant data as possible for this evaluation. It is important to note that the conclusions that lead to this procedure are derived in large part from the available data. Always take into account that the treatment will also be dependent on availability of resources and existing treatment guidelines, considered by other Pain Management Practitioners as being common knowledge and practice, at the time of the intervention. For Medico-Legal purposes, it is also  important to point out that variation in procedural techniques and pharmacological choices are the acceptable norm. The indications, contraindications, technique, and results of the above procedure should only be interpreted and judged by a Board-Certified Interventional Pain Specialist with extensive familiarity and expertise in the same exact procedure and technique.

## 2023-10-20 NOTE — Patient Instructions (Addendum)
 ______________________________________________________________________    TENS (Device can be purchased "online", without prescription. Search: "TENS 7000".) Transcutaneous electrical nerve stimulation (TENS) is a method of pain relief involving the use of a mild electrical current. A TENS machine is a small, battery-operated device that has leads connected to sticky pads called electrodes.   Rechargeable 9V batteries:     Electrode placement:   TENS UNIT SAFETY WARNING SHEET and INFORMATION INDICATIONS AND CONTRAINDICTIONS Read the operation manual before using the device. Federal law ([place] ) restricts this device to sale by or on the order of a physician. Observe your physician's precise instructions and let him show you where to apply the electrodes. For a successful therapy, the correct application of the electrodes is an important factor. Carefully write down the settings your physician recommended. Indications for use This device is a prescription device and only for symptomatic relief of chronic intractable pain. Contraindications:   Any electrode placement that applies current to the carotid sinus (neck) region.   Patients with implanted electronic devices (for example, a pacemaker) or metallic implants should not undertake.   Any electrode placement that causes current to flow transcerebrally (through the head). The use of unit whenever pain symptoms are undiagnosed, unit etiology is determined.   The use of TENS whenever pain syndromes are undiagnosed, until etiology is established.  WARNINGS AND PRECAUTIONS  Warnings:   The device must be kept out of reach of children.   The safety of device for use during pregnancy or delivery has not been established.   Do not place electrodes on front of the throat. This may result in spasms of the laryngeal and pharyngeal muscles.   Do not place the electrodes over the carotid nerve (side of neck below ear).   The device is not effective for pain of  central origin (headaches).   The device may interfere with electronic monitoring equipment (such as ECG monitors and ECG alarms).   Electrodes should not be placed over the eyes, in the mouth, or internally.   These devices have no curative value.   TENS devices should be used only under the continued supervision of a physician.   TENS is a symptomatic treatment and as such suppresses the sensation of pain which would otherwise serve as a protective mechanism. Precautions/Adverse Reactions   Isolated cases of skin irritation may occur at the site of electrode placement following long-term application.   Stimulation should be stopped and electrodes removed until the cause of the irritation can be determined.   Effectiveness is highly dependent upon patient selection by a person qualified in the management of pain patients.   If the device treatment becomes ineffective or unpleasant, stimulation should be discontinued until reevaluation by a physician/clinician.   Always turn the device off before applying or removing electrodes.   Skin irritation and electrode burns are potential adverse reactions.  PURPOSE: A Transcutaneous Electrical Nerve Stimulator, or TENS, unit is designed to relieve post-operative, acute and chronic pain. It is used for pain caused by peripheral nerves and not central. TENS units are prescription-only devices.  OPERATION: TENS units work in a couple of ways. The first way they are thought to work is by a method called the [employer]. The [employer] states that our brains can only handle one stimulus at a time. When you have chronic pain, this pain signal is constantly being sent to your brain and recognized as pain. When an electrical stimulus is added to the area of pain the body feels  this electrical stimulus, and since the brain can only handle one thing at a time, the pain is not transmitted to the brain. The second method thought to be part of TENS unit's success is by way of  stimulating our own bodies to release their own natural painkillers. TENS units do not work for everyone and results may vary. Always follow the instructions and warnings in your user's manual.  USE: One of the most important tasks that must be performed is battery maintenance. If you are using a [employer], always fully charge it and fully deplete it before charging it again. These batteries can develop memories and by not performing this charging task correctly, your battery's life can be greatly diminished. If your battery does develop a memory you can help expand the memory by charging for 12 - 13 hours and then completely depleting the battery. Always prepare the skin before applying electrodes. Your skin should be clean and free of any lotions or creams. If you are using electrodes that use conductive gel, apply a small, even layer over the electrode. For carbon, self-adhesive electrodes, apply a drop of water to the electrodes before applying to the skin. The electrodes attach to the lead wires and then the TENS unit. Always grasp the connector and not the cord when inserting or removing. When making adjustments, always make sure the unit's channels (1 and 2) are in the OFF position. The actual settings should be recommended and prescribed by your physician. Medical equipment suppliers don'tset or instruct users as to user settings. When you are using the BURST mode, the unit delivers a series of quick pulses followed by a rest. This cycle repeats itself frequently. Always have channels OFF before changing modes.  For MODULATION mode, the stimulation automatically varies the width of the pulse.  For CONVENTIONAL mode, the stimulation is constant. After the settings have been fine-tuned, set the timer to 30 or 60 minutes. Your physician should also prescribe the use time. When the lights become dim, it means your batteries should be replaced or recharged.  ACCESSORIES: The  electrodes and lead wires can be obtained from your medical equipment supplier. Your medical equipment supplier can set up a recurring delivery to accommodate your needs. Electrodes should be replaced once a month and lead wires once every 6 months.  Video Tutorial [email or web address]://[email or web address]/[email or web address]  ______________________________________________________________________    ______________________________________________________________________    Post-Procedure Discharge Instructions  Instructions: Apply ice:  Purpose: This will minimize any swelling and discomfort after procedure.  When: Day of procedure, as soon as you get home. How: Fill a plastic sandwich bag with crushed ice. Cover it with a small towel and apply to injection site. How long: (15 min on, 15 min off) Apply for 15 minutes then remove x 15 minutes.  Repeat sequence on day of procedure, until you go to bed. Apply heat:  Purpose: To treat any soreness and discomfort from the procedure. When: Starting the next day after the procedure. How: Apply heat to procedure site starting the day following the procedure. How long: May continue to repeat daily, until discomfort goes away. Food intake: Start with clear liquids (like water) and advance to regular food, as tolerated.  Physical activities: Keep activities to a minimum for the first 8 hours after the procedure. After that, then as tolerated. Driving: If you have received any sedation, be responsible and do not drive. You are not allowed to drive for 24 hours after having sedation. Blood thinner: (Applies only to  those taking blood thinners) You may restart your blood thinner 6 hours after your procedure. Insulin : (Applies only to Diabetic patients taking insulin ) As soon as you can eat, you may resume your normal dosing schedule. Infection prevention: Keep procedure site clean and dry. Shower daily and clean area with soap and water. Post-procedure Pain Diary:  Extremely important that this be done correctly and accurately. Recorded information will be used to determine the next step in treatment. For the purpose of accuracy, follow these rules: Evaluate only the area treated. Do not report or include pain from an untreated area. For the purpose of this evaluation, ignore all other areas of pain, except for the treated area. After your procedure, avoid taking a long nap and attempting to complete the pain diary after you wake up. Instead, set your alarm clock to go off every hour, on the hour, for the initial 8 hours after the procedure. Document the duration of the numbing medicine, and the relief you are getting from it. Do not go to sleep and attempt to complete it later. It will not be accurate. If you received sedation, it is likely that you were given a medication that may cause amnesia. Because of this, completing the diary at a later time may cause the information to be inaccurate. This information is needed to plan your care. Follow-up appointment: Keep your post-procedure follow-up evaluation appointment after the procedure (usually 2 weeks for most procedures, 6 weeks for radiofrequencies). DO NOT FORGET to bring you pain diary with you.   Expect: (What should I expect to see with my procedure?) From numbing medicine (AKA: Local Anesthetics): Numbness or decrease in pain. You may also experience some weakness, which if present, could last for the duration of the local anesthetic. Onset: Full effect within 15 minutes of injected. Duration: It will depend on the type of local anesthetic used. On the average, 1 to 8 hours.  From steroids None given From procedure: Some discomfort is to be expected once the numbing medicine wears off. This should be minimal if ice and heat are applied as instructed.  Call if: (When should I call?) You experience numbness and weakness that gets worse with time, as opposed to wearing off. New onset bowel or bladder  incontinence. (Applies only to procedures done in the spine)  Emergency Numbers: Durning business hours (Monday - Thursday, 8:00 AM - 4:00 PM) (Friday, 9:00 AM - 12:00 Noon): [phone] After hours: [phone] NOTE: If you are having a problem and are unable connect with, or to talk to a provider, then go to your nearest urgent care or emergency department. If the problem is serious and urgent, please call 911. ______________________________________________________________________

## 2023-10-21 ENCOUNTER — Telehealth: Payer: Self-pay | Admitting: *Deleted

## 2023-10-21 NOTE — Telephone Encounter (Signed)
 No problems post procedure.

## 2023-11-01 NOTE — Progress Notes (Unsigned)
 PROVIDER NOTE: Interpretation of information contained herein should be left to medically-trained personnel. Specific patient instructions are provided elsewhere under Patient Instructions section of medical record. This document was created in part using AI and STT-dictation technology, any transcriptional errors that may result from this process are unintentional.  Patient: Shannon [name]  Service: E/M   PCP: Sedalia Velma RAMAN, MD  DOB: 07/22/1955  DOS: 11/04/2023  Provider: Eric DELENA Como, MD  MRN: [id number]  Delivery: Face-to-face  Specialty: Interventional Pain Management  Type: Established Patient  Setting: Ambulatory outpatient facility  Specialty designation: 09  Referring Prov.: Sedalia Velma RAMAN, MD  Location: Outpatient office facility       History of present illness (HPI) Shannon [name], a 69 y.o. year old female, is here today because of her No primary diagnosis found.. Shannon [name] primary complain today is No chief complaint on file.  Pertinent problems: Shannon [name] has Systemic lupus erythematosus (HCC); Lumbosacral radiculopathy at L5; Osteoarthritis of knee; Sacroiliac joint pain; Arthritis; Chronic pain syndrome; Primary osteoarthritis of shoulders (Bilateral); Myalgia due to statin; Seronegative spondylitis (HCC); Abnormal MRI, lumbar spine (12/16/2022) [facility]); Lumbar Grade 1 Anterolisthesis of L3/L4 & L4/L5; Chronic lower extremity pain (2ry area of Pain) (Bilateral) (R>L); Chronic low back pain (1ry area of Pain) (Bilateral) w/ sciatica (Bilateral); Lower extremity weakness (Left); Lumbar facet joint pain; Chronic low back pain (Bilateral) w/o sciatica; Vertebrogenic low back pain (L5-S1); Lumbar facet hypertrophy; Lumbosacral facet hypertrophy; Lumbar spinal stenosis, w/o neurogenic claudication; Lumbar foraminal stenosis; Spondylosis without myelopathy or radiculopathy, lumbosacral region; Lumbar facet joint syndrome; Low back pain of over 3 months duration; Mechanical low  back pain; Multifactorial low back pain; Trigger point; Trigger point with back pain; and Lumbar trigger point syndrome on their pertinent problem list.  Pain Assessment: Severity of   is reported as a  /10. Location:    / . Onset:  . Quality:  . Timing:  . Modifying factor(s):  SABRA Vitals:  vitals were not taken for this visit.  BMI: Estimated body mass index is 28.29 kg/m as calculated from the following:   Height as of 10/20/23: 5' 5 (1.651 m).   Weight as of 10/20/23: 170 lb (77.1 kg).  Last encounter: 07/21/2023. Last procedure: 10/20/2023.  Reason for encounter: post-procedure evaluation and assessment.   Discussed the use of AI scribe software for clinical note transcription with the patient, who gave verbal consent to proceed.  History of Present Illness         Post-Procedure Evaluation   Type:  ESM & QLM Trigger Point Injection (Myoneural Block) (1-2 muscle groups)  #1 (w/o steroids)  CPT: 20552 Laterality: Left (-LT)   Imaging: N/A. Landmark-guided,           Anesthesia: Local anesthesia (1-2% Lidocaine ) Anxiolysis: IV Versed  2.0 mg Sedation: No Sedation None required. No Fentanyl  administered.         DOS: 10/20/2023  Performed by: Eric DELENA Como, MD  Medical Necessity (reasoning)  Purpose: Diagnostic/Therapeutic Rationale (medical necessity): procedure needed and proper for the diagnosis and/or treatment of Shannon [name]'s medical symptoms and needs. Indications: Left low back pain severe enough to impact quality of life and/or function. 1. Chronic low back pain (Bilateral) w/o sciatica   2. Trigger point with back pain   3. Trigger point   4. Lumbar trigger point syndrome   5. Mechanical low back pain   6. Multifactorial low back pain   7. Low back pain of over 3 months duration  8. Chronic anticoagulation (Eliquis )    NAS-11 Pain score:   Pre-procedure: 5 /10   Post-procedure: 0-No pain/10       Effectiveness:  Initial hour after procedure:    ***. Subsequent 4-6 hours post-procedure:   ***. Analgesia past initial 6 hours:   ***. Ongoing improvement:  Analgesic:  *** Function:    ***    ROM:    ***      Pharmacotherapy Assessment   Opioid Analgesic(s): No chronic opioid analgesics therapy prescribed by our practice. None MME/day: 0 mg/day   Monitoring: [place] PMP: PDMP reviewed during this encounter.       Pharmacotherapy: No side-effects or adverse reactions reported. Compliance: No problems identified. Effectiveness: Clinically acceptable.  No notes on file  UDS:  Summary  Date Value Ref Range Status  04/20/2023 FINAL  Final    Comment:    ==================================================================== Compliance Drug Analysis, Ur ==================================================================== Test                             Result       Flag       Units  Drug Present and Declared for Prescription Verification   Acetaminophen                   PRESENT      EXPECTED  Drug Present not Declared for Prescription Verification   Dextromethorphan               PRESENT      UNEXPECTED   Dextrorphan/Levorphanol        PRESENT      UNEXPECTED    Dextrorphan is an expected metabolite of dextromethorphan, an over-    the-counter or prescription cough suppressant. Dextrorphan cannot be    distinguished from the scheduled prescription medication levorphanol    by the method used for analysis.  Drug Absent but Declared for Prescription Verification   Tramadol                        Not Detected UNEXPECTED ng/mg creat   Trazodone                      Not Detected UNEXPECTED   Lidocaine                       Not Detected UNEXPECTED    Lidocaine , as indicated in the declared medication list, is not    always detected even when used as directed.    Metoprolol                      Not Detected UNEXPECTED ==================================================================== Test                      Result    Flag   Units       Ref Range   Creatinine              150              mg/dL      >=79 ==================================================================== Declared Medications:  The flagging and interpretation on this report are based on the  following declared medications.  Unexpected results may arise from  inaccuracies in the declared medications.   **Note: The testing scope of this panel includes these medications:   Metoprolol   Tramadol  (Ultram )  Trazodone (Desyrel)   **Note:  The testing scope of this panel does not include small to  moderate amounts of these reported medications:   Acetaminophen  (Tylenol )  Topical Lidocaine  (Lidoderm )   **Note: The testing scope of this panel does not include the  following reported medications:   Albuterol  (Ventolin  HFA)  Apixaban  (Eliquis )  Azathioprine  (Imuran )  Celecoxib  (Celebrex )  Empagliflozin  (Jardiance )  Furosemide  (Lasix )  Insulin  (Tresiba )  Lactulose  Loratadine (Claritin)  Losartan (Cozaar)  Magnesium (Mag-Ox)  Metformin   Miconazole   Nystatin  (Mycostatin )  Omeprazole  (Prilosec)  Prednisone  (Deltasone )  Simethicone  Spironolactone  (Aldactone )  Vitamin B12  Vitamin D3 ==================================================================== For clinical consultation, please call [phone]. ====================================================================     No results found for: CBDTHCR No results found for: D8THCCBX No results found for: D9THCCBX  ROS  Constitutional: Denies any fever or chills Gastrointestinal: No reported hemesis, hematochezia, vomiting, or acute GI distress Musculoskeletal: Denies any acute onset joint swelling, redness, loss of ROM, or weakness Neurological: No reported episodes of acute onset apraxia, aphasia, dysarthria, agnosia, amnesia, paralysis, loss of coordination, or loss of consciousness  Medication Review  Magnesium Oxide -Mg Supplement, ONE TOUCH ULTRA SYSTEM KIT, Pen Needles,  Simethicone, Vitamin D3, acetaminophen , albuterol , apixaban , azaTHIOprine , blood glucose meter kit and supplies, celecoxib , cyanocobalamin , fluconazole , furosemide , gabapentin , glucose blood, insulin  degludec, lactulose, lidocaine , loratadine, losartan, metFORMIN , metoprolol  tartrate, miconazole , nystatin , omeprazole , pantoprazole , predniSONE , spironolactone , tizanidine , traMADol , and traZODone  History Review  Allergy: Shannon [name] is allergic to hydrocodone , lipitor [atorvastatin  calcium ], milk-related compounds, oxycodone, wellbutrin [bupropion], and adhesive [tape]. Drug: Shannon [name]  reports no history of drug use. Alcohol:  reports no history of alcohol use. Tobacco:  reports that she has quit smoking. Her smoking use included cigarettes. She has a 15 pack-year smoking history. She has never used smokeless tobacco. Social: Shannon [name]  reports that she has quit smoking. Her smoking use included cigarettes. She has a 15 pack-year smoking history. She has never used smokeless tobacco. She reports that she does not drink alcohol and does not use drugs. Medical:  has a past medical history of Abdominal bloating (08/16/2013), Adjustment disorder (11/04/2010), Asthma, Atrial fibrillation with RVR (HCC), Autoimmune hepatitis (HCC), Bradycardia, Depression, Diabetes mellitus without complication (HCC), Dilated cardiomyopathy (HCC), DUB (dysfunctional uterine bleeding), Elevated liver enzymes (04/11/2020), Exposure to TB (2005), Fatigue (01/23/2015), Gallstones, Gastric ulcer, GERD (gastroesophageal reflux disease), HFrEF (heart failure with reduced ejection fraction) (HCC), History of asthma (02/26/2018), History of chicken pox, migraines, Leukopenia (04/11/2020), Liver disease, Motion sickness, Osteoarthritis, Systemic lupus erythematosus (HCC) (05/31/2010), Thoracic radiculitis (03/05/2012), and Vitamin D  deficiency. Surgical: Shannon [name]  has a past surgical history that includes Laparoscopic hysterectomy;  Tubal ligation; Back surgery; Abdominal hysterectomy; Hallux valgus austin (Left, 02/09/2019); and Hammer toe surgery (Left, 02/09/2019). Family: family history includes Alcohol abuse in her brother; Cancer in her mother; Diabetes in her sister, sister, and son; Heart disease in her brother and father; Hypertension in her father, sister, sister, and son; Kidney disease in her sister; Rheum arthritis in her sister; Sarcoidosis in her sister.  Laboratory Chemistry Profile   Renal Lab Results  Component Value Date   BUN 13 04/20/2023   CREATININE 0.88 04/20/2023   BCR 15 04/20/2023   GFR 90.32 12/18/2021    Hepatic Lab Results  Component Value Date   AST 29 04/20/2023   ALT 29 12/18/2021   ALBUMIN 3.9 04/20/2023   ALKPHOS 131 (H) 04/20/2023   LIPASE 31.0 01/23/2015    Electrolytes Lab Results  Component Value Date   NA 144 04/20/2023   K  4.7 04/20/2023   CL 106 04/20/2023   CALCIUM  9.2 04/20/2023   MG 1.8 04/20/2023    Bone Lab Results  Component Value Date   VD25OH 65.28 12/18/2021   25OHVITD1 40 04/20/2023   25OHVITD2 1.1 04/20/2023   25OHVITD3 39 04/20/2023    Inflammation (CRP: Acute Phase) (ESR: Chronic Phase) Lab Results  Component Value Date   CRP <1 04/20/2023   ESRSEDRATE 58 (H) 04/20/2023         Note: Above Lab results reviewed.  Recent Imaging Review  DG PAIN CLINIC C-ARM 1-60 MIN NO REPORT Fluoro was used, but no Radiologist interpretation will be provided.  Please refer to NOTES tab for provider progress note. Note: Reviewed        Physical Exam  General appearance: Well nourished, well developed, and well hydrated. In no apparent acute distress Mental status: Alert, oriented x 3 (person, place, & time)       Respiratory: No evidence of acute respiratory distress Eyes: PERLA Vitals: There were no vitals taken for this visit. BMI: Estimated body mass index is 28.29 kg/m as calculated from the following:   Height as of 10/20/23: 5' 5 (1.651 m).    Weight as of 10/20/23: 170 lb (77.1 kg). Ideal: Ideal body weight: 57 kg (125 lb 10.6 oz) Adjusted ideal body weight: 65 kg (143 lb 6.4 oz)  Assessment   Diagnosis Status  No diagnosis found. Controlled Controlled Controlled   Updated Problems: No problems updated.  Plan of Care  Problem-specific:  Assessment and Plan            Shannon [name] has a current medication list which includes the following long-term medication(s): albuterol , apixaban , furosemide , gabapentin , loratadine, metformin , metoprolol  tartrate, omeprazole , simethicone, spironolactone , tizanidine , tramadol , and tramadol .  Pharmacotherapy (Medications Ordered): No orders of the defined types were placed in this encounter.  Orders:  No orders of the defined types were placed in this encounter.    Interventional Therapies  Risk Factors  Considerations  Medical Comorbidities:  Eliquis  Anticoagulation: (Stop: 3 days  Restart: 6 hours)  A-Fib  BA  IDDM w/ hx. Coma  steroid-induced MD  Autoimmune hepatitis  liver cirrhosis  Hx. Portal Encephalopathy  Portal-HTN  GERD  Esophageal varices  Hx. Adrenal insuficiency  SLE (Lupus)  dilated cardiomyopathy  Thrombocytopoenia  BA     Planned  Pending:   Diagnostic bilateral lumbar facet (L2-S1) RFA #1 the patient has indicated being interested in starting with the left side.   Under consideration:   Therapeutic bilateral lumbar facet RFA #1 (starting with the left) Therapeutic L5-S1 Intracept procedure    Completed:   Diagnostic bilateral lumbar facet (L2-S1) MBB x2 (07/07/2023) (100/100/100/85)    Therapeutic  Palliative (PRN) options:   None established   Completed by other providers:   Therapeutic left sacroiliac joint inj. x3 (03/29/2015, 04/16/2020, 09/08/2022) by Franky Curtistine Ambrosia, DO [facility])  Therapeutic knee injections by orthopedics Therapeutic shoulder injections by [facility]     No follow-ups on file.     Recent Visits Date Type Provider Dept  10/20/23 Procedure visit Tanya Glisson, MD [facility]  09/15/23 Procedure visit Tanya Glisson, MD [facility]  Showing recent visits within past 90 days and meeting all other requirements Future Appointments Date Type Provider Dept  11/04/23 Appointment Tanya Glisson, MD [facility]  Showing future appointments within next 90 days and meeting all other requirements  I discussed the assessment and treatment plan  with the patient. The patient was provided an opportunity to ask questions and all were answered. The patient agreed with the plan and demonstrated an understanding of the instructions.  Patient advised to call back or seek an in-person evaluation if the symptoms or condition worsens.  Duration of encounter: *** minutes.  Total time on encounter, as per AMA guidelines included both the face-to-face and non-face-to-face time personally spent by the physician and/or other qualified health care professional(s) on the day of the encounter (includes time in activities that require the physician or other qualified health care professional and does not include time in activities normally performed by clinical staff). Physician's time may include the following activities when performed: Preparing to see the patient (e.g., pre-charting review of records, searching for previously ordered imaging, lab work, and nerve conduction tests) Review of prior analgesic pharmacotherapies. Reviewing PMP Interpreting ordered tests (e.g., lab work, imaging, nerve conduction tests) Performing post-procedure evaluations, including interpretation of diagnostic procedures Obtaining and/or reviewing separately obtained history Performing a medically appropriate examination and/or evaluation Counseling and educating the patient/family/caregiver Ordering medications, tests, or procedures Referring and communicating with other  health care professionals (when not separately reported) Documenting clinical information in the electronic or other health record Independently interpreting results (not separately reported) and communicating results to the patient/ family/caregiver Care coordination (not separately reported)  Note by: Eric DELENA Como, MD (TTS and AI technology used. I apologize for any typographical errors that were not detected and corrected.) Date: 11/04/2023; Time: 5:09 PM

## 2023-11-04 ENCOUNTER — Encounter: Payer: Self-pay | Admitting: Pain Medicine

## 2023-11-04 ENCOUNTER — Ambulatory Visit: Attending: Pain Medicine | Admitting: Pain Medicine

## 2023-11-04 VITALS — BP 101/72 | HR 73 | Temp 97.3°F | Ht 65.0 in | Wt 170.0 lb

## 2023-11-04 DIAGNOSIS — Z09 Encounter for follow-up examination after completed treatment for conditions other than malignant neoplasm: Secondary | ICD-10-CM | POA: Insufficient documentation

## 2023-11-04 DIAGNOSIS — M791 Myalgia, unspecified site: Secondary | ICD-10-CM | POA: Insufficient documentation

## 2023-11-04 DIAGNOSIS — M5459 Other low back pain: Secondary | ICD-10-CM | POA: Insufficient documentation

## 2023-11-04 DIAGNOSIS — M549 Dorsalgia, unspecified: Secondary | ICD-10-CM | POA: Diagnosis present

## 2023-11-04 DIAGNOSIS — G8929 Other chronic pain: Secondary | ICD-10-CM | POA: Diagnosis not present

## 2023-11-04 DIAGNOSIS — M545 Low back pain, unspecified: Secondary | ICD-10-CM | POA: Insufficient documentation

## 2023-11-04 NOTE — Patient Instructions (Signed)
 ______________________________________________________________________    TENS (Device can be purchased online, without prescription. Search: TENS 7000.) Transcutaneous electrical nerve stimulation (TENS) is a method of pain relief involving the use of a mild electrical current. A TENS machine is a small, battery-operated device that has leads connected to sticky pads called electrodes.   Rechargeable 9V batteries:     Electrode placement:   TENS UNIT SAFETY WARNING SHEET and INFORMATION INDICATIONS AND CONTRAINDICTIONS Read the operation manual before using the device. Freight forwarder ([place] ) restricts this device to sale by or on the order of a physician. Observe your physician's precise instructions and let him show you where to apply the electrodes. For a successful therapy, the correct application of the electrodes is an important factor. Carefully write down the settings your physician recommended. Indications for use This device is a prescription device and only for symptomatic relief of chronic intractable pain. Contraindications:   Any electrode placement that applies current to the carotid sinus (neck) region.   Patients with implanted electronic devices (for example, a pacemaker) or metallic implants should not undertake.   Any electrode placement that causes current to flow transcerebrally (through the head). The use of unit whenever pain symptoms are undiagnosed, unit etiology is determined.   The use of TENS whenever pain syndromes are undiagnosed, until etiology is established.  WARNINGS AND PRECAUTIONS  Warnings:   The device must be kept out of reach of children.   The safety of device for use during pregnancy or delivery has not been established.   Do not place electrodes on front of the throat. This may result in spasms of the laryngeal and pharyngeal muscles.   Do not place the electrodes over the carotid nerve (side of neck below ear).   The device is not effective for pain of  central origin (headaches).   The device may interfere with electronic monitoring equipment (such as ECG monitors and ECG alarms).   Electrodes should not be placed over the eyes, in the mouth, or internally.   These devices have no curative value.   TENS devices should be used only under the continued supervision of a physician.   TENS is a symptomatic treatment and as such suppresses the sensation of pain which would otherwise serve as a protective mechanism. Precautions/Adverse Reactions   Isolated cases of skin irritation may occur at the site of electrode placement following long-term application.   Stimulation should be stopped and electrodes removed until the cause of the irritation can be determined.   Effectiveness is highly dependent upon patient selection by a person qualified in the management of pain patients.   If the device treatment becomes ineffective or unpleasant, stimulation should be discontinued until reevaluation by a physician/clinician.   Always turn the device off before applying or removing electrodes.   Skin irritation and electrode burns are potential adverse reactions.  PURPOSE: A Transcutaneous Electrical Nerve Stimulator, or TENS, unit is designed to relieve post-operative, acute and chronic pain. It is used for pain caused by peripheral nerves and not central. TENS units are prescription-only devices.  OPERATION: TENS units work in a couple of ways. The first way they are thought to work is by a method called the [employer]. The [employer] states that our brains can only handle one stimulus at a time. When you have chronic pain, this pain signal is constantly being sent to your brain and recognized as pain. When an electrical stimulus is added to the area of pain the body feels  this electrical stimulus, and since the brain can only handle one thing at a time, the pain is not transmitted to the brain. The second method thought to be part of TENS unit's success is by way of  stimulating our own bodies to release their own natural painkillers. TENS units do not work for everyone and results may vary. Always follow the instructions and warnings in your user's manual.  USE: One of the most important tasks that must be performed is battery maintenance. If you are using a Engineering geologist, always fully charge it and fully deplete it before charging it again. These batteries can develop memories and by not performing this charging task correctly, your battery's life can be greatly diminished. If your battery does develop a memory you can help expand the memory by charging for 12 - 13 hours and then completely depleting the battery. Always prepare the skin before applying electrodes. Your skin should be clean and free of any lotions or creams. If you are using electrodes that use conductive gel, apply a small, even layer over the electrode. For carbon, self-adhesive electrodes, apply a drop of water to the electrodes before applying to the skin. The electrodes attach to the lead wires and then the TENS unit. Always grasp the connector and not the cord when inserting or removing. When making adjustments, always make sure the unit's channels (1 and 2) are in the OFF position. The actual settings should be recommended and prescribed by your physician. Medical equipment suppliers don'tset or instruct users as to user settings. When you are using the BURST mode, the unit delivers a series of quick pulses followed by a rest. This cycle repeats itself frequently. Always have channels OFF before changing modes.  For MODULATION mode, the stimulation automatically varies the width of the pulse.  For CONVENTIONAL mode, the stimulation is constant. After the settings have been fine-tuned, set the timer to 30 or 60 minutes. Your physician should also prescribe the use time. When the lights become dim, it means your batteries should be replaced or recharged.  ACCESSORIES: The  electrodes and lead wires can be obtained from your medical equipment supplier. Your medical equipment supplier can set up a recurring delivery to accommodate your needs. Electrodes should be replaced once a month and lead wires once every 6 months.  Video Tutorial [email or web address]://[email or web address]  ______________________________________________________________________

## 2024-04-27 ENCOUNTER — Ambulatory Visit: Attending: Pain Medicine | Admitting: Pain Medicine

## 2024-04-27 VITALS — BP 131/77 | HR 78 | Temp 97.2°F | Resp 16 | Ht 65.0 in | Wt 170.0 lb

## 2024-04-27 DIAGNOSIS — M545 Low back pain, unspecified: Secondary | ICD-10-CM | POA: Diagnosis present

## 2024-04-27 DIAGNOSIS — R937 Abnormal findings on diagnostic imaging of other parts of musculoskeletal system: Secondary | ICD-10-CM | POA: Diagnosis present

## 2024-04-27 DIAGNOSIS — M48061 Spinal stenosis, lumbar region without neurogenic claudication: Secondary | ICD-10-CM | POA: Insufficient documentation

## 2024-04-27 DIAGNOSIS — M4316 Spondylolisthesis, lumbar region: Secondary | ICD-10-CM | POA: Insufficient documentation

## 2024-04-27 DIAGNOSIS — Z7901 Long term (current) use of anticoagulants: Secondary | ICD-10-CM | POA: Diagnosis present

## 2024-04-27 DIAGNOSIS — G8929 Other chronic pain: Secondary | ICD-10-CM | POA: Diagnosis present

## 2024-04-27 DIAGNOSIS — M47817 Spondylosis without myelopathy or radiculopathy, lumbosacral region: Secondary | ICD-10-CM | POA: Diagnosis present

## 2024-04-27 DIAGNOSIS — M47816 Spondylosis without myelopathy or radiculopathy, lumbar region: Secondary | ICD-10-CM | POA: Diagnosis present

## 2024-04-27 DIAGNOSIS — M5459 Other low back pain: Secondary | ICD-10-CM | POA: Diagnosis present

## 2024-04-27 NOTE — Progress Notes (Signed)
 Safety precautions to be maintained throughout the outpatient stay will include: orient to surroundings, keep bed in low position, maintain call bell within reach at all times, provide assistance with transfer out of bed and ambulation.

## 2024-04-27 NOTE — Progress Notes (Signed)
 PROVIDER NOTE: Interpretation of information contained herein should be left to medically-trained personnel. Specific patient instructions are provided elsewhere under Patient Instructions section of medical record. This document was created in part using AI and STT-dictation technology, any transcriptional errors that may result from this process are unintentional.  Patient: Shannon [name]  Service: E/M   PCP: Sedalia Velma RAMAN, MD  DOB: Feb 14, 1956  DOS: 04/27/2024  Provider: Eric DELENA Como, MD  MRN: [id number]  Delivery: Face-to-face  Specialty: Interventional Pain Management  Type: Established Patient  Setting: Ambulatory outpatient facility  Specialty designation: 09  Referring Prov.: Sedalia Velma RAMAN, MD  Location: Outpatient office facility       History of present illness (HPI) Shannon [name], a 67 y.o. year old female, is here today because of her Acute exacerbation of chronic low back pain [M54.50, G89.29]. Ms. Kinkade primary complain today is Back Pain (lower)  Pertinent problems: Ms. Mode has Systemic lupus erythematosus (HCC); Lumbosacral radiculopathy at L5; Osteoarthritis of knee; Sacroiliac joint pain; Arthritis; Chronic pain syndrome; Primary osteoarthritis of shoulders (Bilateral); Myalgia due to statin; Seronegative spondylitis; Abnormal MRI, lumbar spine (12/16/2022) [facility]); Lumbar Grade 1 Anterolisthesis of L3/L4 & L4/L5; Chronic lower extremity pain (2ry area of Pain) (Bilateral) (R>L); Chronic low back pain (1ry area of Pain) (Bilateral) w/ sciatica (Bilateral); Lower extremity weakness (Left); Lumbar facet joint pain; Chronic low back pain (Bilateral) w/o sciatica; Vertebrogenic low back pain (L5-S1); Lumbar facet hypertrophy; Lumbosacral facet hypertrophy; Lumbar spinal stenosis, w/o neurogenic claudication; Lumbar foraminal stenosis; Spondylosis without myelopathy or radiculopathy, lumbosacral region; Lumbar facet joint syndrome; Low back pain of over 3 months duration;  Mechanical low back pain; Multifactorial low back pain; Trigger point; Trigger point with back pain; Lumbar trigger point syndrome; Intermittent low back pain; Intractable low back pain; Low back pain potentially associated with radiculopathy; and Acute exacerbation of chronic low back pain on their pertinent problem list.  Pain Assessment: Severity of Chronic pain is reported as a 7 /10. Location: Back Right, Left/L45 left right lower. Onset: More than a month ago. Quality: Aching, Restless, Moaning, Burning, Stabbing, Discomfort, Tiring. Timing: Constant. Modifying factor(s): rest. Vitals:  height is 5' 5 (1.651 m) and weight is 170 lb (77.1 kg). Her temperature is 97.2 F (36.2 C) (abnormal). Her blood pressure is 131/77 and her pulse is 78. Her respiration is 16 and oxygen saturation is 100%.  BMI: Estimated body mass index is 28.29 kg/m as calculated from the following:   Height as of this encounter: 5' 5 (1.651 m).   Weight as of this encounter: 170 lb (77.1 kg).  Last encounter: 11/04/2023. Last procedure: 11/04/2023.  Reason for encounter: evaluation of worsening, or previously known (established) problem.   Discussed the use of AI scribe software for clinical note transcription with the patient, who gave verbal consent to proceed.  History of Present Illness   Shannon [name] is a 69 year old female with chronic back pain who presents with a flare-up of her usual pain.  She had a flare of chronic low back pain initially treated with left lumbar facet radiofrequency ablation on 09/15/2023 and trigger point injections on 10/20/2023. The trigger point injections gave complete relief while the local anesthetic was active and about 85% improvement for 5 days before the pain returned.  Her current pain is sharp and piercing in the lower back, similar to her preprocedure pain. It began to recur about 30 days after the last procedure and has progressively worsened. She denies  recent weight  change, medication changes, falls, leg pain, numbness, or weakness. She sometimes feels the pain almost breaking but it does not radiate fully down her legs.  She has steroid-induced diabetes, portal hypertension, and cirrhosis and takes Eliquis  for anticoagulation.       Pharmacotherapy Assessment   Opioid Analgesic(s): No chronic opioid analgesics therapy prescribed by our practice. None MME/day: 0 mg/day   Monitoring: [place] PMP: PDMP reviewed during this encounter.       Pharmacotherapy: No side-effects or adverse reactions reported. Compliance: No problems identified. Effectiveness: Clinically acceptable.  Dorlene Montie FALCON, RN  04/27/2024 11:13 AM  Sign when Signing Visit Safety precautions to be maintained throughout the outpatient stay will include: orient to surroundings, keep bed in low position, maintain call bell within reach at all times, provide assistance with transfer out of bed and ambulation.     UDS:  Summary  Date Value Ref Range Status  04/20/2023 FINAL  Final    Comment:    ==================================================================== Compliance Drug Analysis, Ur ==================================================================== Test                             Result       Flag       Units  Drug Present and Declared for Prescription Verification   Acetaminophen                   PRESENT      EXPECTED  Drug Present not Declared for Prescription Verification   Dextromethorphan               PRESENT      UNEXPECTED   Dextrorphan/Levorphanol        PRESENT      UNEXPECTED    Dextrorphan is an expected metabolite of dextromethorphan, an over-    the-counter or prescription cough suppressant. Dextrorphan cannot be    distinguished from the scheduled prescription medication levorphanol    by the method used for analysis.  Drug Absent but Declared for Prescription Verification   Tramadol                        Not Detected UNEXPECTED ng/mg creat    Trazodone                      Not Detected UNEXPECTED   Lidocaine                       Not Detected UNEXPECTED    Lidocaine , as indicated in the declared medication list, is not    always detected even when used as directed.    Metoprolol                      Not Detected UNEXPECTED ==================================================================== Test                      Result    Flag   Units      Ref Range   Creatinine              150              mg/dL      >=79 ==================================================================== Declared Medications:  The flagging and interpretation on this report are based on the  following declared medications.  Unexpected results may arise from  inaccuracies in the declared medications.   **Note:  The testing scope of this panel includes these medications:   Metoprolol   Tramadol  (Ultram )  Trazodone (Desyrel)   **Note: The testing scope of this panel does not include small to  moderate amounts of these reported medications:   Acetaminophen  (Tylenol )  Topical Lidocaine  (Lidoderm )   **Note: The testing scope of this panel does not include the  following reported medications:   Albuterol  (Ventolin  HFA)  Apixaban  (Eliquis )  Azathioprine  (Imuran )  Celecoxib  (Celebrex )  Empagliflozin  (Jardiance )  Furosemide  (Lasix )  Insulin  (Tresiba )  Lactulose  Loratadine (Claritin)  Losartan (Cozaar)  Magnesium (Mag-Ox)  Metformin   Miconazole   Nystatin  (Mycostatin )  Omeprazole  (Prilosec)  Prednisone  (Deltasone )  Simethicone  Spironolactone  (Aldactone )  Vitamin B12  Vitamin D3 ==================================================================== For clinical consultation, please call [phone]. ====================================================================     No results found for: CBDTHCR No results found for: D8THCCBX No results found for: D9THCCBX  ROS  Constitutional: Denies any fever or chills Gastrointestinal:  No reported hemesis, hematochezia, vomiting, or acute GI distress Musculoskeletal: Denies any acute onset joint swelling, redness, loss of ROM, or weakness Neurological: No reported episodes of acute onset apraxia, aphasia, dysarthria, agnosia, amnesia, paralysis, loss of coordination, or loss of consciousness  Medication Review  Magnesium Oxide -Mg Supplement, ONE TOUCH ULTRA SYSTEM KIT, Pen Needles, Simethicone, Vitamin D3, acetaminophen , albuterol , apixaban , azaTHIOprine , blood glucose meter kit and supplies, celecoxib , cyanocobalamin , fluconazole , furosemide , gabapentin , glucose blood, insulin  degludec, lactulose, lidocaine , loratadine, losartan, metFORMIN , metoprolol  tartrate, miconazole , nystatin , omeprazole , spironolactone , tizanidine , traMADol , and traZODone  History Review  Allergy: Ms. Duzan is allergic to hydrocodone , lipitor [atorvastatin  calcium ], milk-related compounds, oxycodone, wellbutrin [bupropion], and adhesive [tape]. Drug: Ms. Boal  reports no history of drug use. Alcohol:  reports no history of alcohol use. Tobacco:  reports that she has quit smoking. Her smoking use included cigarettes. She has a 15 pack-year smoking history. She has never used smokeless tobacco. Social: Ms. Bachtel  reports that she has quit smoking. Her smoking use included cigarettes. She has a 15 pack-year smoking history. She has never used smokeless tobacco. She reports that she does not drink alcohol and does not use drugs. Medical:  has a past medical history of Abdominal bloating (08/16/2013), Adjustment disorder (11/04/2010), Asthma, Atrial fibrillation with RVR (HCC), Autoimmune hepatitis (HCC), Bradycardia, Depression, Diabetes mellitus without complication (HCC), Dilated cardiomyopathy (HCC), DUB (dysfunctional uterine bleeding), Elevated liver enzymes (04/11/2020), Exposure to TB (2005), Fatigue (01/23/2015), Gallstones, Gastric ulcer, GERD (gastroesophageal reflux disease), HFrEF (heart  failure with reduced ejection fraction) (HCC), History of asthma (02/26/2018), History of chicken pox, migraines, Leukopenia (04/11/2020), Liver disease, Motion sickness, Osteoarthritis, Systemic lupus erythematosus (HCC) (05/31/2010), Thoracic radiculitis (03/05/2012), and Vitamin D  deficiency. Surgical: Ms. Heindl  has a past surgical history that includes Laparoscopic hysterectomy; Tubal ligation; Back surgery; Abdominal hysterectomy; Hallux valgus austin (Left, 02/09/2019); and Hammer toe surgery (Left, 02/09/2019). Family: family history includes Alcohol abuse in her brother; Cancer in her mother; Diabetes in her sister, sister, and son; Heart disease in her brother and father; Hypertension in her father, sister, sister, and son; Kidney disease in her sister; Rheum arthritis in her sister; Sarcoidosis in her sister.  Laboratory Chemistry Profile   Renal Lab Results  Component Value Date   BUN 13 04/20/2023   CREATININE 0.88 04/20/2023   BCR 15 04/20/2023   GFR 90.32 12/18/2021    Hepatic Lab Results  Component Value Date   AST 29 04/20/2023   ALT 29 12/18/2021   ALBUMIN 3.9 04/20/2023   ALKPHOS 131 (H) 04/20/2023   LIPASE 31.0  01/23/2015    Electrolytes Lab Results  Component Value Date   NA 144 04/20/2023   K 4.7 04/20/2023   CL 106 04/20/2023   CALCIUM  9.2 04/20/2023   MG 1.8 04/20/2023    Bone Lab Results  Component Value Date   VD25OH 65.28 12/18/2021   25OHVITD1 40 04/20/2023   25OHVITD2 1.1 04/20/2023   25OHVITD3 39 04/20/2023    Inflammation (CRP: Acute Phase) (ESR: Chronic Phase) Lab Results  Component Value Date   CRP <1 04/20/2023   ESRSEDRATE 58 (H) 04/20/2023         Note: Above Lab results reviewed.  Recent Imaging Review  DG PAIN CLINIC C-ARM 1-60 MIN NO REPORT Fluoro was used, but no Radiologist interpretation will be provided.  Please refer to NOTES tab for provider progress note. Note: Reviewed        Physical Exam  Vitals: BP 131/77    Pulse 78   Temp (!) 97.2 F (36.2 C)   Resp 16   Ht 5' 5 (1.651 m)   Wt 170 lb (77.1 kg)   SpO2 100%   BMI 28.29 kg/m  BMI: Estimated body mass index is 28.29 kg/m as calculated from the following:   Height as of this encounter: 5' 5 (1.651 m).   Weight as of this encounter: 170 lb (77.1 kg). Ideal: Ideal body weight: 57 kg (125 lb 10.6 oz) Adjusted ideal body weight: 65 kg (143 lb 6.4 oz) General appearance: Well nourished, well developed, and well hydrated. In no apparent acute distress Mental status: Alert, oriented x 3 (person, place, & time)       Respiratory: No evidence of acute respiratory distress Eyes: PERLA   Assessment   Diagnosis Status  1. Acute exacerbation of chronic low back pain   2. Low back pain of over 3 months duration   3. Lumbar Grade 1 Anterolisthesis of L3/L4 & L4/L5   4. Lumbar spinal stenosis, w/o neurogenic claudication   5. Low back pain potentially associated with radiculopathy   6. Intermittent low back pain   7. Intractable low back pain   8. Multifactorial low back pain   9. Lumbar facet hypertrophy   10. Lumbar facet joint pain   11. Lumbar facet joint syndrome   12. Abnormal MRI, lumbar spine (12/16/2022) ([facility])   13. Spondylosis without myelopathy or radiculopathy, lumbosacral region   14. Chronic anticoagulation (Eliquis )    Controlled Controlled Controlled   Updated Problems: Problem  Intermittent Low Back Pain  Intractable Low Back Pain  Low Back Pain Potentially Associated With Radiculopathy  Acute Exacerbation of Chronic Low Back Pain  Abnormal MRI, lumbar spine (12/16/2022) [facility])   (12/16/2022) LUMBAR MRI FINDINGS: [facility]) Modic type II degenerative endplate changes at L5-S1. Trace anterolisthesis at L3-4. Grade I anterolisthesis at L4-5. Multilevel disc desiccation and height loss, most pronounced at L5-S1.   DISC LEVELS: T12-L1: Mild diffuse disc bulge and ligamentum flavum thickening. L1-2: Diffuse disc  bulge with ligamentum flavum thickening and facet hypertrophy results in mild spinal canal stenosis.  L2-3: Diffuse disc bulge with ligamentum flavum thickening and facet hypertrophy, results in mild spinal canal stenosis.  L3-4: Diffuse disc bulge with ligamentum flavum thickening and facet hypertrophy results in mild to moderate spinal canal stenosis and mild right neural foraminal narrowing. L4-5: Diffuse disc bulge with ligamentum flavum thickening and prominent bilateral facet arthropathy, results in mild to moderate spinal canal stenosis, and mild right neural foraminal narrowing. L5-S1: Prominent intervertebral disc height loss.  Diffuse disc bulge with facet hypertrophy and posterior osteophyte formation, contributes to moderate right and mild to moderate left neural foraminal narrowing.   The paraspinal tissues demonstrate moderate paraspinal muscle atrophy.  For the purposes of this dictation, the lowest well formed intervertebral disc space is assumed to be the L5-S1 level, and there are presumed to be five lumbar-type vertebral bodies.  IMPRESSION: Degenerative changes in the lumbar spine with low-grade spinal canal and neuroforaminal stenoses, including: 1.  Mild to moderate spinal canal and mild right neural foraminal stenosis at L3-4 and L4-5. 2. Moderate right and mild to moderate left neural foraminal stenosis at L5-S1. 3. Grade 1 anterolisthesis of L4 on L5 with trace anterolisthesis at L3-L4   Hepatic Encephalopathy (Hcc)  Enteritis    Plan of Care  Problem-specific:  Assessment and Plan    Chronic low back pain due to lumbar facet arthropathy   Chronic low back pain has flared up, initially managed with radiofrequency ablation and trigger point injections. Pain returned after 30 days, now bilateral with a sharp, piercing quality. There is no leg pain, numbness, or weakness, and no recent falls or trauma. Anxiety and depression exacerbate the pain. Management focuses on  controlling flare-ups due to underlying arthritis. Scheduled bilateral facet joint injections to manage the current flare-up. X-rays were reviewed to assess potential upper lumbar spine involvement. She is advised to report pain flare-ups promptly to prevent exacerbation and the need for repeated treatments.       Ms. Janiene S Massimo has a current medication list which includes the following long-term medication(s): albuterol , apixaban , furosemide , gabapentin , loratadine, metformin , metoprolol  tartrate, omeprazole , simethicone, spironolactone , tizanidine , tramadol , and tramadol .  Pharmacotherapy (Medications Ordered): No orders of the defined types were placed in this encounter.  Orders:  Orders Placed This Encounter  Procedures   Lumbar Epidural Injection    Standing Status:   Future    Expiration Date:   07/26/2024    Scheduling Instructions:     Procedure: Interlaminar Lumbar Epidural Steroid injection (LESI)  L1-2     Laterality: Right-sided     Sedation: With Sedation.     Timeframe: see Blood-thinner protocol.    Where will this procedure be performed?:   [facility]   Blood Thinner Instructions to Nursing    Always make sure patient has clearance from prescribing physician to stop blood thinners for interventional therapies. If the patient requires a Lovenox-bridge therapy, make sure arrangements are made to institute it with the assistance of the PCP.    Scheduling Instructions:     Have Ms. Kerchner stop the Eliquis  (Apixaban ) x 3 days prior to procedure or surgery.     Interventional Therapies  Risk Factors  Considerations  Medical Comorbidities:  Eliquis  Anticoagulation: (Stop: 3 days  Restart: 6 hours)  A-Fib  BA  IDDM w/ hx. Coma  steroid-induced MD  Autoimmune hepatitis  liver cirrhosis  Hx. Portal Encephalopathy  Portal-HTN  GERD  Esophageal varices  Hx. Adrenal insuficiency  SLE (Lupus)  dilated cardiomyopathy  Thrombocytopoenia  BA      Planned  Pending:   Diagnostic/therapeutic right L1-2 LESI #1    Under consideration:  (Avoid Steroids 2ry to DM) (if possible) Therapeutic left ESM & QLM MNB/TPI #2 (w/o steroids)  Therapeutic left lumbar facet RFA #2  Therapeutic L5-S1 Intracept procedure    Completed:   Diagnostic/therapeutic left ESM & QLM MNB/TPI x1 (10/20/2023) (100/100/85/75)  Therapeutic left lumbar facet (L2-S1) MB RFA x1 (09/15/2023) 100/100/85/75)  Diagnostic bilateral  lumbar facet (L2-S1) MBB x2 (07/07/2023) (100/100/100/85)  Patient set up with the TENS 7000 unit (11/04/2023)    Therapeutic  Palliative (PRN) options:   None established   Completed by other providers:   Therapeutic left sacroiliac joint inj. x3 (03/29/2015, 04/16/2020, 09/08/2022) by Franky Curtistine Ambrosia, DO [facility])  Therapeutic knee injections by orthopedics Therapeutic shoulder injections by [facility]     Return for (R) L1-2 LESI #1 , (Blood Thinner Protocol).    Recent Visits No visits were found meeting these conditions. Showing recent visits within past 90 days and meeting all other requirements Today's Visits Date Type Provider Dept  04/27/24 Office Visit Tanya Glisson, MD [facility]  Showing today's visits and meeting all other requirements Future Appointments No visits were found meeting these conditions. Showing future appointments within next 90 days and meeting all other requirements  I discussed the assessment and treatment plan with the patient. The patient was provided an opportunity to ask questions and all were answered. The patient agreed with the plan and demonstrated an understanding of the instructions.  Patient advised to call back or seek an in-person evaluation if the symptoms or condition worsens.  Duration of encounter: 30 minutes.  Total time on encounter, as per AMA guidelines included both the face-to-face and non-face-to-face time personally spent by the physician  and/or other qualified health care professional(s) on the day of the encounter (includes time in activities that require the physician or other qualified health care professional and does not include time in activities normally performed by clinical staff). Physician's time may include the following activities when performed: Preparing to see the patient (e.g., pre-charting review of records, searching for previously ordered imaging, lab work, and nerve conduction tests) Review of prior analgesic pharmacotherapies. Reviewing PMP Interpreting ordered tests (e.g., lab work, imaging, nerve conduction tests) Performing post-procedure evaluations, including interpretation of diagnostic procedures Obtaining and/or reviewing separately obtained history Performing a medically appropriate examination and/or evaluation Counseling and educating the patient/family/caregiver Ordering medications, tests, or procedures Referring and communicating with other health care professionals (when not separately reported) Documenting clinical information in the electronic or other health record Independently interpreting results (not separately reported) and communicating results to the patient/ family/caregiver Care coordination (not separately reported)  Note by: Glisson DELENA Tanya, MD (TTS and AI technology used. I apologize for any typographical errors that were not detected and corrected.) Date: 04/27/2024; Time: 11:53 AM

## 2024-04-27 NOTE — Patient Instructions (Signed)
 ______________________________________________________________________    Blood Thinners  IMPORTANT NOTICE:  If you take any of these, make sure to notify the nursing staff.  Failure to do so may result in serious injury.  Recommended time intervals to stop and restart blood-thinners, before & after invasive procedures  Generic Name Brand Name Pre-procedure: Stop medication for this amount of time before your procedure: Post-procedure: Wait this amount of time after the procedure before restarting your medication:  Abciximab Reopro 15 days 2 hrs  Alteplase Activase 10 days 10 days  Anagrelide Agrylin    Apixaban Eliquis 3 days 6 hrs  Cilostazol Pletal 3 days 5 hrs  Clopidogrel Plavix 7-10 days 2 hrs  Dabigatran Pradaxa 5 days 6 hrs  Dalteparin Fragmin 24 hours 4 hrs  Dipyridamole Aggrenox 11days 2 hrs  Edoxaban Lixiana; Savaysa 3 days 2 hrs  Enoxaparin  Lovenox 24 hours 4 hrs  Eptifibatide Integrillin 8 hours 2 hrs  Fondaparinux  Arixtra 72 hours 12 hrs  Hydroxychloroquine Plaquenil 11 days   Prasugrel Effient 7-10 days 6 hrs  Reteplase Retavase 10 days 10 days  Rivaroxaban  Xarelto  3 days 6 hrs  Ticagrelor Brilinta 5-7 days 6 hrs  Ticlopidine Ticlid 10-14 days 2 hrs  Tinzaparin Innohep 24 hours 4 hrs  Tirofiban Aggrastat 8 hours 2 hrs  Warfarin Coumadin  5 days 2 hrs   Other medications with blood-thinning effects  NOTE: Consider stopping these if you have prolonged bleeding despite not taking any of the above blood thinners. Otherwise ask your provider and this will be decided on a case-by-case basis.  Product indications Generic (Brand) names Note  Cholesterol Lipitor Stop 4 days before procedure  Blood thinner (injectable) Heparin  (LMW or LMWH Heparin ) Stop 24 hours before procedure  Cancer Ibrutinib (Imbruvica) Stop 7 days before procedure  Malaria/Rheumatoid Hydroxychloroquine (Plaquenil) Stop 11 days before procedure  Thrombolytics  10 days before or after procedures    Over-the-counter (OTC) Products with blood-thinning effects  NOTE: Consider stopping these if you have prolonged bleeding despite not taking any of the above blood thinners. Otherwise ask your provider and this will be decided on a case-by-case basis.  Product Common names Stop Time  Aspirin > 325 mg Goody Powders, Excedrin, etc. 11 days  Aspirin <= 81 mg  7 days  Fish oil  4 days  Garlic supplements  7 days  Ginkgo biloba  36 hours  Ginseng  24 hours  NSAIDs Ibuprofen, Naprosyn , etc. 3 days  Vitamin E  4 days   ______________________________________________________________________     ______________________________________________________________________    Procedure instructions  Stop blood-thinners  Do not eat or drink fluids (other than water) for 6 hours before your procedure  No water for 2 hours before your procedure  Take your blood pressure medicine with a sip of water  Arrive 30 minutes before your appointment  If sedation is planned, bring suitable driver. [place], [place], & public transportation are NOT APPROVED)  Carefully read the Preparing for your procedure detailed instructions  If you have questions call us  at [phone]  Procedure appointments are for procedures only.   NO medication refills or new problem evaluations will be done on procedure days.   Only the scheduled, pre-approved procedure and side will be done.   ______________________________________________________________________     ______________________________________________________________________    Preparing for your procedure  Appointments: If you think you may not be able to keep your appointment, call 24-48 hours in advance to cancel. We need time to make it available to others.  Procedure visits are for procedures only. During your procedure appointment there will be: NO Prescription Refills*. NO medication changes or discussions*. NO discussion of disability  issues*. NO unrelated pain problem evaluations*. NO evaluations to order other pain procedures*. *These will be addressed at a separate and distinct evaluation encounter on the provider's evaluation schedule and not during procedure days.  Instructions: Food intake: Avoid eating anything solid for at least 8 hours prior to your procedure. Clear liquid intake: You may take clear liquids such as water up to 2 hours prior to your procedure. (No carbonated drinks. No soda.) Transportation: Unless otherwise stated by your physician, bring a driver. (Driver cannot be a Market researcher, Pharmacist, community, or any other form of public transportation.) Morning Medicines: Except for blood thinners, take all of your other morning medications with a sip of water. Make sure to take your heart and blood pressure medicines. If your blood pressure's lower number is above 100, the case will be rescheduled. Blood thinners: Make sure to stop your blood thinners as instructed.  If you take a blood thinner, but were not instructed to stop it, call our office [phone] and ask to talk to a nurse. Not stopping a blood thinner prior to certain procedures could lead to serious complications. Diabetics on insulin: Notify the staff so that you can be scheduled 1st case in the morning. If your diabetes requires high dose insulin, take only  of your normal insulin dose the morning of the procedure and notify the staff that you have done so. Preventing infections: Shower with an antibacterial soap the morning of your procedure.  Build-up your immune system: Take 1000 mg of Vitamin C with every meal (3 times a day) the day prior to your procedure. Antibiotics: Inform the nursing staff if you are taking any antibiotics or if you have any conditions that may require antibiotics prior to procedures. (Example: recent joint implants)   Pregnancy: If you are pregnant make sure to notify the nursing staff. Not doing so may result in injury to the fetus,  including death.  Sickness: If you have a cold, fever, or any active infections, call and cancel or reschedule your procedure. Receiving steroids while having an infection may result in complications. Arrival: You must be in the facility at least 30 minutes prior to your scheduled procedure. Tardiness: Your scheduled time is also the cutoff time. If you do not arrive at least 15 minutes prior to your procedure, you will be rescheduled.  Children: Do not bring any children with you. Make arrangements to keep them home. Dress appropriately: There is always a possibility that your clothing may get soiled. Avoid long dresses. Valuables: Do not bring any jewelry or valuables.  Reasons to call and reschedule or cancel your procedure: (Following these recommendations will minimize the risk of a serious complication.) Surgeries: Avoid having procedures within 2 weeks of any surgery. (Avoid for 2 weeks before or after any surgery). Flu Shots: Avoid having procedures within 2 weeks of a flu shots or . (Avoid for 2 weeks before or after immunizations). Barium: Avoid having a procedure within 7-10 days after having had a radiological study involving the use of radiological contrast. (Myelograms, Barium swallow or enema study). Heart attacks: Avoid any elective procedures or surgeries for the initial 6 months after a Myocardial Infarction (Heart Attack). Blood thinners: It is imperative that you stop these medications before procedures. Let us  know if you if you take any blood thinner.  Infection: Avoid procedures during or  within two weeks of an infection (including chest colds or gastrointestinal problems). Symptoms associated with infections include: Localized redness, fever, chills, night sweats or profuse sweating, burning sensation when voiding, cough, congestion, stuffiness, runny nose, sore throat, diarrhea, nausea, vomiting, cold or Flu symptoms, recent or current infections. It is specially important if  the infection is over the area that we intend to treat. Heart and lung problems: Symptoms that may suggest an active cardiopulmonary problem include: cough, chest pain, breathing difficulties or shortness of breath, dizziness, ankle swelling, uncontrolled high or unusually low blood pressure, and/or palpitations. If you are experiencing any of these symptoms, cancel your procedure and contact your primary care physician for an evaluation.  Remember:  Regular Business hours are:  Monday to Thursday 8:00 AM to 4:00 PM  Provider's Schedule: Eric Como, MD:  Procedure days: Tuesday and Thursday 7:30 AM to 4:00 PM  Wallie Sherry, MD:  Procedure days: Monday and Wednesday 7:30 AM to 4:00 PM Last  Updated: 04/21/2023 ______________________________________________________________________     ______________________________________________________________________    General Risks and Possible Complications  Patient Responsibilities: It is important that you read this as it is part of your informed consent. It is our duty to inform you of the risks and possible complications associated with treatments offered to you. It is your responsibility as a patient to read this and to ask questions about anything that is not clear or that you believe was not covered in this document.  Patient's Rights: You have the right to refuse treatment. You also have the right to change your mind, even after initially having agreed to have the treatment done. However, under this last option, if you wait until the last second to change your mind, you may be charged for the materials used up to that point.  Introduction: Medicine is not an Visual merchandiser. Everything in Medicine, including the lack of treatment(s), carries the potential for danger, harm, or loss (which is by definition: Risk). In Medicine, a complication is a secondary problem, condition, or disease that can aggravate an already existing one. All  treatments carry the risk of possible complications. The fact that a side effects or complications occurs, does not imply that the treatment was conducted incorrectly. It must be clearly understood that these can happen even when everything is done following the highest safety standards.  No treatment: You can choose not to proceed with the proposed treatment alternative. The "PRO(s)" would include: avoiding the risk of complications associated with the therapy. The "CON(s)" would include: not getting any of the treatment benefits. These benefits fall under one of three categories: diagnostic; therapeutic; and/or palliative. Diagnostic benefits include: getting information which can ultimately lead to improvement of the disease or symptom(s). Therapeutic benefits are those associated with the successful treatment of the disease. Finally, palliative benefits are those related to the decrease of the primary symptoms, without necessarily curing the condition (example: decreasing the pain from a flare-up of a chronic condition, such as incurable terminal cancer).  General Risks and Complications: These are associated to most interventional treatments. They can occur alone, or in combination. They fall under one of the following six (6) categories: no benefit or worsening of symptoms; bleeding; infection; nerve damage; allergic reactions; and/or death. No benefits or worsening of symptoms: In Medicine there are no guarantees, only probabilities. No healthcare provider can ever guarantee that a medical treatment will work, they can only state the probability that it may. Furthermore, there is always the possibility that the  condition may worsen, either directly, or indirectly, as a consequence of the treatment. Bleeding: This is more common if the patient is taking a blood thinner, either prescription or over the counter (example: Goody Powders, Fish oil, Aspirin, Garlic, etc.), or if suffering a condition  associated with impaired coagulation (example: Hemophilia, cirrhosis of the liver, low platelet counts, etc.). However, even if you do not have one on these, it can still happen. If you have any of these conditions, or take one of these drugs, make sure to notify your treating physician. Infection: This is more common in patients with a compromised immune system, either due to disease (example: diabetes, cancer, human immunodeficiency virus [HIV], etc.), or due to medications or treatments (example: therapies used to treat cancer and rheumatological diseases). However, even if you do not have one on these, it can still happen. If you have any of these conditions, or take one of these drugs, make sure to notify your treating physician. Nerve Damage: This is more common when the treatment is an invasive one, but it can also happen with the use of medications, such as those used in the treatment of cancer. The damage can occur to small secondary nerves, or to large primary ones, such as those in the spinal cord and brain. This damage may be temporary or permanent and it may lead to impairments that can range from temporary numbness to permanent paralysis and/or brain death. Allergic Reactions: Any time a substance or material comes in contact with our body, there is the possibility of an allergic reaction. These can range from a mild skin rash (contact dermatitis) to a severe systemic reaction (anaphylactic reaction), which can result in death. Death: In general, any medical intervention can result in death, most of the time due to an unforeseen complication. ______________________________________________________________________      ______________________________________________________________________    Steroid injections  Common steroids for injections Triamcinolone : Used by many sports medicine physicians for large joint and bursal injections, often combined with a local anesthetic like lidocaine . A  study focusing on coccydynia (tailbone pain) found triamcinolone  was more effective than betamethasone, suggesting it may also be preferable for other localized inflammation conditions. Methylprednisolone : A common alternative to triamcinolone  that is also a strong anti-inflammatory. It is available in different formulations, with the acetate suspension being the long-acting option for intra-articular injections. Dexamethasone: This is a non-particulate steroid, meaning it has a lower risk of tissue damage compared to particulate steroids like triamcinolone  and methylprednisolone . While less common for this specific use, it is an option for targeted injections.   Considerations for physicians Particulate vs. non-particulate steroids: Triamcinolone  and methylprednisolone  are particulate, meaning they can clump together. Dexamethasone is non-particulate. Particulate steroids are often preferred for their longer-lasting effects but carry a theoretical higher risk for certain injections (though this is less of a concern in the costochondral joints). Combined injectate: Corticosteroids are typically mixed with a local anesthetic like lidocaine  to provide both immediate pain relief (from the anesthetic) and longer-term inflammation reduction (from the steroid). Imaging guidance: To ensure accurate placement of the needle and medication, physicians may use ultrasound or fluoroscopic guidance for the injection, especially in complex or refractory cases.   Patient guidance Before undergoing a steroid injection, discuss the options with your physician. They will determine the best steroid, dosage, and procedure for your specific case based on factors like: Severity of your condition History of response to other treatments Your overall health status Experience and preference of the physician  Last  Updated: 01/05/2024 ______________________________________________________________________

## 2024-05-31 ENCOUNTER — Ambulatory Visit
Admission: RE | Admit: 2024-05-31 | Discharge: 2024-05-31 | Disposition: A | Discharge status: Home / Self Care | Source: Ambulatory Visit | Attending: Pain Medicine | Admitting: Pain Medicine

## 2024-05-31 ENCOUNTER — Ambulatory Visit (HOSPITAL_BASED_OUTPATIENT_CLINIC_OR_DEPARTMENT_OTHER): Admitting: Pain Medicine

## 2024-05-31 ENCOUNTER — Encounter: Payer: Self-pay | Admitting: Pain Medicine

## 2024-05-31 VITALS — BP 131/99 | HR 77 | Temp 97.0°F | Resp 16 | Ht 65.0 in | Wt 172.0 lb

## 2024-05-31 DIAGNOSIS — M545 Low back pain, unspecified: Secondary | ICD-10-CM | POA: Diagnosis present

## 2024-05-31 DIAGNOSIS — M4316 Spondylolisthesis, lumbar region: Secondary | ICD-10-CM

## 2024-05-31 DIAGNOSIS — M5442 Lumbago with sciatica, left side: Secondary | ICD-10-CM | POA: Insufficient documentation

## 2024-05-31 DIAGNOSIS — I4891 Unspecified atrial fibrillation: Secondary | ICD-10-CM | POA: Diagnosis present

## 2024-05-31 DIAGNOSIS — M5417 Radiculopathy, lumbosacral region: Secondary | ICD-10-CM | POA: Insufficient documentation

## 2024-05-31 DIAGNOSIS — Z7901 Long term (current) use of anticoagulants: Secondary | ICD-10-CM

## 2024-05-31 DIAGNOSIS — M5441 Lumbago with sciatica, right side: Secondary | ICD-10-CM | POA: Diagnosis not present

## 2024-05-31 DIAGNOSIS — M79604 Pain in right leg: Secondary | ICD-10-CM | POA: Insufficient documentation

## 2024-05-31 DIAGNOSIS — G8929 Other chronic pain: Secondary | ICD-10-CM | POA: Insufficient documentation

## 2024-05-31 DIAGNOSIS — M48061 Spinal stenosis, lumbar region without neurogenic claudication: Secondary | ICD-10-CM | POA: Insufficient documentation

## 2024-05-31 DIAGNOSIS — M5451 Vertebrogenic low back pain: Secondary | ICD-10-CM | POA: Insufficient documentation

## 2024-05-31 DIAGNOSIS — M79605 Pain in left leg: Secondary | ICD-10-CM | POA: Diagnosis present

## 2024-05-31 DIAGNOSIS — M5459 Other low back pain: Secondary | ICD-10-CM

## 2024-05-31 DIAGNOSIS — R937 Abnormal findings on diagnostic imaging of other parts of musculoskeletal system: Secondary | ICD-10-CM | POA: Insufficient documentation

## 2024-05-31 MED ORDER — LIDOCAINE HCL 2 % IJ SOLN
20.0000 mL | Freq: Once | INTRAMUSCULAR | Status: AC
  Administered 2024-05-31: 100 mg
  Filled 2024-05-31: qty 40

## 2024-05-31 MED ORDER — TRIAMCINOLONE ACETONIDE 40 MG/ML IJ SUSP
40.0000 mg | Freq: Once | INTRAMUSCULAR | Status: AC
  Administered 2024-05-31: 40 mg
  Filled 2024-05-31: qty 1

## 2024-05-31 MED ORDER — ROPIVACAINE HCL 2 MG/ML IJ SOLN
2.0000 mL | Freq: Once | INTRAMUSCULAR | Status: AC
  Administered 2024-05-31: 2 mL via EPIDURAL
  Filled 2024-05-31: qty 20

## 2024-05-31 MED ORDER — IOHEXOL 180 MG/ML  SOLN
10.0000 mL | Freq: Once | INTRAMUSCULAR | Status: AC
  Administered 2024-05-31: 10 mL via EPIDURAL
  Filled 2024-05-31: qty 20

## 2024-05-31 MED ORDER — MIDAZOLAM HCL (PF) 2 MG/2ML IJ SOLN
0.5000 mg | Freq: Once | INTRAMUSCULAR | Status: AC
  Administered 2024-05-31: 2 mg via INTRAVENOUS
  Filled 2024-05-31: qty 2

## 2024-05-31 MED ORDER — PENTAFLUOROPROP-TETRAFLUOROETH EX AERO
INHALATION_SPRAY | Freq: Once | CUTANEOUS | Status: AC
  Administered 2024-05-31: 30 via TOPICAL

## 2024-05-31 MED ORDER — FENTANYL CITRATE (PF) 100 MCG/2ML IJ SOLN: 25.0000 ug | INTRAMUSCULAR | Status: DC | PRN

## 2024-05-31 MED ORDER — MIDAZOLAM HCL 5 MG/5ML IJ SOLN: 0.5000 mg | Freq: Once | INTRAMUSCULAR | Status: DC

## 2024-05-31 MED ORDER — SODIUM CHLORIDE 0.9% FLUSH
2.0000 mL | Freq: Once | INTRAVENOUS | Status: AC
  Administered 2024-05-31: 2 mL

## 2024-05-31 NOTE — Progress Notes (Signed)
 PROVIDER NOTE: Interpretation of information contained herein should be left to medically-trained personnel. Specific patient instructions are provided elsewhere under Patient Instructions section of medical record. This document was created in part using STT-dictation technology, any transcriptional errors that may result from this process are unintentional.  Patient: Shannon [name] Type: Established DOB: 04/03/1956 MRN: [id number] PCP: Sedalia Velma RAMAN, MD  Service: Procedure DOS: 05/31/2024 Setting: Ambulatory Location: Ambulatory outpatient facility Delivery: Face-to-face Provider: Eric DELENA Como, MD Specialty: Interventional Pain Management Specialty designation: 09 Location: Outpatient facility Ref. Prov.: Como Eric, MD       Interventional Therapy   Type: Lumbar epidural steroid injection (LESI) (interlaminar) #1    Laterality: Right   Level:  L1-2 Level.  Imaging: Fluoroscopic guidance Spinal ([id number]) Anesthesia: Local anesthesia (1-2% Lidocaine ) Anxiolysis: IV Versed   2.0 mg           Analgesia: No Sedation                       DOS: 05/31/2024  Performed by: Eric DELENA Como, MD  Purpose: Diagnostic/Therapeutic Indications: Lumbar radicular pain of intraspinal etiology of more than 4 weeks that has failed to respond to conservative therapy and is severe enough to impact quality of life or function. 1. Chronic low back pain (1ry area of Pain) (Bilateral) w/ sciatica (Bilateral)   2. Chronic lower extremity pain (2ry area of Pain) (Bilateral) (R>L)   3. Low back pain of over 3 months duration   4. Low back pain potentially associated with radiculopathy   5. Lumbar spinal stenosis, w/o neurogenic claudication   6. Lumbar Grade 1 Anterolisthesis of L3/L4 & L4/L5   7. Lumbosacral radiculopathy at L5   8. Multifactorial low back pain   9. Vertebrogenic low back pain (L5-S1)   10. Abnormal MRI, lumbar spine (12/16/2022) ([facility])   11. Chronic anticoagulation  (Eliquis )   12. Atrial fibrillation with RVR (HCC)    NAS-11 Pain score:   Pre-procedure: 7 /10   Post-procedure: 0-No pain (standing and getting in wheelchair)/10      Position / Prep / Materials:  Position: Prone w/ head of the table raised (slight reverse trendelenburg) to facilitate breathing.  Prep solution: ChloraPrep (2% chlorhexidine gluconate and 70% isopropyl alcohol) Prep Area: Entire Posterior Lumbar Region from lower scapular tip down to mid buttocks area and from flank to flank. Materials:  Tray: Epidural tray Needle(s):  Type: Epidural needle (Tuohy) Gauge (G):  17 Length: Regular (3.5-in) Qty: 1  H&P (Pre-op Assessment):  Shannon [name] is a 69 y.o. (year old), female patient, seen today for interventional treatment. She  has a past surgical history that includes Laparoscopic hysterectomy; Tubal ligation; Back surgery; Abdominal hysterectomy; Hallux valgus austin (Left, 02/09/2019); and Hammer toe surgery (Left, 02/09/2019). Shannon [name] has a current medication list which includes the following prescription(s): acetaminophen , albuterol , apixaban , azathioprine , blood glucose meter kit and supplies, blood glucose meter kit and supplies, one touch ultra system kit, celecoxib , vitamin d3, fluconazole , furosemide , gabapentin , onetouch verio, tresiba  flextouch, pen needles, lactulose, lidocaine , loratadine, losartan, magnesium oxide -mg supplement, metformin , metoprolol  tartrate, miconazole  3, nystatin , omeprazole , simethicone, spironolactone , tizanidine , tramadol , tramadol , trazodone, and cyanocobalamin . Her primarily concern today is the Back Pain (lower)  Initial Vital Signs:  Pulse/HCG Rate: 77ECG Heart Rate: 81 Temp: (!) 97 F (36.1 C) Resp: 16 BP: 124/76 SpO2: 100 %  BMI: Estimated body mass index is 28.62 kg/m as calculated from the following:   Height as of this encounter: 5'  5 (1.651 m).   Weight as of this encounter: 172 lb (78 kg).  Risk Assessment: Allergies:  Reviewed. She is allergic to hydrocodone , lipitor [atorvastatin  calcium ], milk-related compounds, oxycodone, wellbutrin [bupropion], and adhesive [tape].  Allergy Precautions: None required Coagulopathies: Reviewed. None identified.  Blood-thinner therapy: None at this time Active Infection(s): Reviewed. None identified. Shannon [name] is afebrile  Site Confirmation: Shannon [name] was asked to confirm the procedure and laterality before marking the site Procedure checklist: Completed Consent: Before the procedure and under the influence of no sedative(s), amnesic(s), or anxiolytics, the patient was informed of the treatment options, risks and possible complications. To fulfill our ethical and legal obligations, as recommended by the American Medical Association's Code of Ethics, I have informed the patient of my clinical impression; the nature and purpose of the treatment or procedure; the risks, benefits, and possible complications of the intervention; the alternatives, including doing nothing; the risk(s) and benefit(s) of the alternative treatment(s) or procedure(s); and the risk(s) and benefit(s) of doing nothing. The patient was provided information about the general risks and possible complications associated with the procedure. These may include, but are not limited to: failure to achieve desired goals, infection, bleeding, organ or nerve damage, allergic reactions, paralysis, and death. In addition, the patient was informed of those risks and complications associated to Spine-related procedures, such as failure to decrease pain; infection (i.e.: Meningitis, epidural or intraspinal abscess); bleeding (i.e.: epidural hematoma, subarachnoid hemorrhage, or any other type of intraspinal or peri-dural bleeding); organ or nerve damage (i.e.: Any type of peripheral nerve, nerve root, or spinal cord injury) with subsequent damage to sensory, motor, and/or autonomic systems, resulting in permanent pain, numbness,  and/or weakness of one or several areas of the body; allergic reactions; (i.e.: anaphylactic reaction); and/or death. Furthermore, the patient was informed of those risks and complications associated with the medications. These include, but are not limited to: allergic reactions (i.e.: anaphylactic or anaphylactoid reaction(s)); adrenal axis suppression; blood sugar elevation that in diabetics may result in ketoacidosis or comma; water retention that in patients with history of congestive heart failure may result in shortness of breath, pulmonary edema, and decompensation with resultant heart failure; weight gain; swelling or edema; medication-induced neural toxicity; particulate matter embolism and blood vessel occlusion with resultant organ, and/or nervous system infarction; and/or aseptic necrosis of one or more joints. Finally, the patient was informed that Medicine is not an exact science; therefore, there is also the possibility of unforeseen or unpredictable risks and/or possible complications that may result in a catastrophic outcome. The patient indicated having understood very clearly. We have given the patient no guarantees and we have made no promises. Enough time was given to the patient to ask questions, all of which were answered to the patient's satisfaction. Ms. Bento has indicated that she wanted to continue with the procedure. Attestation: I, the ordering provider, attest that I have discussed with the patient the benefits, risks, side-effects, alternatives, likelihood of achieving goals, and potential problems during recovery for the procedure that I have provided informed consent. Date  Time: 05/31/2024 11:49 AM  Pre-Procedure Preparation:  Monitoring: As per clinic protocol. Respiration, ETCO2, SpO2, BP, heart rate and rhythm monitor placed and checked for adequate function Safety Precautions: Patient was assessed for positional comfort and pressure points before starting the  procedure. Time-out: I initiated and conducted the Time-out before starting the procedure, as per protocol. The patient was asked to participate by confirming the accuracy of the Time Out information. Verification of  the correct person, site, and procedure were performed and confirmed by me, the nursing staff, and the patient. Time-out conducted as per Joint Commission's Universal Protocol (UP.01.01.01). Time: 1245 Start Time: 1245 hrs.  Description/Narrative of Procedure:          Target: Epidural space via interlaminar opening, initially targeting the lower laminar border of the superior vertebral body. Region: Lumbar Approach: Percutaneous paravertebral  Rationale (medical necessity): procedure needed and proper for the diagnosis and/or treatment of the patient's medical symptoms and needs. Procedural Technique Safety Precautions: Aspiration looking for blood return was conducted prior to all injections. At no point did we inject any substances, as a needle was being advanced. No attempts were made at seeking any paresthesias. Safe injection practices and needle disposal techniques used. Medications properly checked for expiration dates. SDV (single dose vial) medications used. Description of the Procedure: Protocol guidelines were followed. The procedure needle was introduced through the skin, ipsilateral to the reported pain, and advanced to the target area. Bone was contacted and the needle walked caudad, until the lamina was cleared. The epidural space was identified using loss-of-resistance technique with 2-3 ml of PF-NaCl (0.9% NSS), in a 5cc LOR glass syringe.  Vitals:   05/31/24 1147 05/31/24 1245 05/31/24 1250 05/31/24 1255  BP: 124/76 (!) 140/109 (!) 124/100 (!) 131/99  Pulse: 77     Resp: 16 18 18 16   Temp: (!) 97 F (36.1 C)     TempSrc: Temporal     SpO2: 100% 100% 100% 100%  Weight: 172 lb (78 kg)     Height: 5' 5 (1.651 m)       Start Time: 1245 hrs. End Time:  1253 hrs.  Imaging Guidance (Spinal):          Type of Imaging Technique: Fluoroscopy Guidance (Spinal) Indication(s): Fluoroscopy guidance for needle placement to enhance accuracy in procedures requiring precise needle localization for targeted delivery of medication in or near specific anatomical locations not easily accessible without such real-time imaging assistance. Exposure Time: Please see nurses notes. Contrast: Before injecting any contrast, we confirmed that the patient did not have an allergy to iodine , shellfish, or radiological contrast. Once satisfactory needle placement was completed at the desired level, radiological contrast was injected. Contrast injected under live fluoroscopy. No contrast complications. See chart for type and volume of contrast used. Fluoroscopic Guidance: I was personally present during the use of fluoroscopy. Tunnel Vision Technique used to obtain the best possible view of the target area. Parallax error corrected before commencing the procedure. Direction-depth-direction technique used to introduce the needle under continuous pulsed fluoroscopy. Once target was reached, antero-posterior, oblique, and lateral fluoroscopic projection used confirm needle placement in all planes. Images permanently stored in EMR. Interpretation: I personally interpreted the imaging intraoperatively. Adequate needle placement confirmed in multiple planes. Appropriate spread of contrast into desired area was observed. No evidence of afferent or efferent intravascular uptake. No intrathecal or subarachnoid spread observed. Permanent images saved into the patient's record.  Antibiotic Prophylaxis:   Anti-infectives (From admission, onward)    None      Indication(s): None identified  Post-operative Assessment:  Post-procedure Vital Signs:  Pulse/HCG Rate: 7781 Temp: (!) 97 F (36.1 C) Resp: 16 BP: (!) 131/99 SpO2: 100 %  EBL: None  Complications: No immediate  post-treatment complications observed by team, or reported by patient.  Note: The patient tolerated the entire procedure well. A repeat set of vitals were taken after the procedure and the patient was kept under  observation following institutional policy, for this type of procedure. Post-procedural neurological assessment was performed, showing return to baseline, prior to discharge. The patient was provided with post-procedure discharge instructions, including a section on how to identify potential problems. Should any problems arise concerning this procedure, the patient was given instructions to immediately contact us , at any time, without hesitation. In any case, we plan to contact the patient by telephone for a follow-up status report regarding this interventional procedure.  Comments:  No additional relevant information.  Plan of Care (POC)  Orders:  Orders Placed This Encounter  Procedures   Lumbar Epidural Injection    Scheduling Instructions:     Procedure: Interlaminar LESI L1-2     Laterality: Right     Sedation: Patient's choice     Date: 05/31/2024    Where will this procedure be performed?:   [facility]   [facility] C-ARM 1-60 MIN NO REPORT    Intraoperative interpretation by procedural physician at [facility].    Standing Status:   Standing    Number of Occurrences:   1    Reason for exam::   Assistance in needle guidance and placement for procedures requiring needle placement in or near specific anatomical locations not easily accessible without such assistance.   Informed Consent Details: Physician/Practitioner Attestation; Transcribe to consent form and obtain patient signature    Note: Always confirm laterality of pain with Ms. Nauta, before procedure. Transcribe to consent form and obtain patient signature.    Physician/Practitioner attestation of informed consent for procedure/surgical case:   I, the physician/practitioner, attest that I have  discussed with the patient the benefits, risks, side effects, alternatives, likelihood of achieving goals and potential problems during recovery for the procedure that I have provided informed consent.    Procedure:   Lumbar epidural steroid injection under fluoroscopic guidance    Physician/Practitioner performing the procedure:   Chaunte Hornbeck A. Tanya, MD    Indication/Reason:   Low back and/or lower extremity pain secondary to lumbar radiculitis   Care order/instruction: Please confirm that the patient has stopped the Eliquis  (Apixaban ) x 3 days prior to procedure or surgery.    Please confirm that the patient has stopped the Eliquis  (Apixaban ) x 3 days prior to procedure or surgery.    Standing Status:   Standing    Number of Occurrences:   1   Provide equipment / supplies at bedside    Procedural tray: Epidural Tray (Disposable  single use) Skin infiltration needle: Regular 1.5-in, 25-G, (x1) Block needle size: Regular standard Catheter: No catheter required    Standing Status:   Standing    Number of Occurrences:   1    Specify:   Epidural Tray   Saline lock IV    Have LR [phone] mL available and administer at 125 mL/hr if patient becomes hypotensive.    Standing Status:   Standing    Number of Occurrences:   1   Bleeding precautions    Standing Status:   Standing    Number of Occurrences:   1     Opioid Analgesic(s): No chronic opioid analgesics therapy prescribed by our practice. None MME/day: 0 mg/day    Medications ordered for procedure: Meds ordered this encounter  Medications   iohexol  (OMNIPAQUE ) 180 MG/ML injection 10 mL    Must be Myelogram-compatible. If not available, you may substitute with a water-soluble, non-ionic, hypoallergenic, myelogram-compatible radiological contrast medium.   lidocaine  (XYLOCAINE ) 2 % (with pres) injection  400 mg   pentafluoroprop-tetrafluoroeth (GEBAUERS) aerosol   DISCONTD: midazolam  (VERSED ) 5 MG/5ML injection 0.5-2 mg    Make sure  Flumazenil is available in the pyxis when using this medication. If oversedation occurs, administer 0.2 mg IV over 15 sec. If after 45 sec no response, administer 0.2 mg again over 1 min; may repeat at 1 min intervals; not to exceed 4 doses (1 mg)   DISCONTD: fentaNYL  (SUBLIMAZE ) injection 25-50 mcg    Make sure Narcan is available in the pyxis when using this medication. In the event of respiratory depression (RR< 8/min): Titrate NARCAN (naloxone) in increments of 0.1 to 0.2 mg IV at 2-3 minute intervals, until desired degree of reversal.   sodium chloride  flush (NS) 0.9 % injection 2 mL   ropivacaine  (PF) 2 mg/mL (0.2%) (NAROPIN ) injection 2 mL   triamcinolone  acetonide (KENALOG -40) injection 40 mg   midazolam  PF (VERSED ) injection 0.5-2 mg    Make sure Flumazenil is available in the pyxis when using this medication. If oversedation occurs, administer 0.2 mg IV over 15 sec. If after 45 sec no response, administer 0.2 mg again over 1 min; may repeat at 1 min intervals; not to exceed 4 doses (1 mg)   Medications administered: We administered iohexol , lidocaine , pentafluoroprop-tetrafluoroeth, sodium chloride  flush, ropivacaine  (PF) 2 mg/mL (0.2%), triamcinolone  acetonide, and midazolam  PF.  See the medical record for exact dosing, route, and time of administration.    Interventional Therapies  Risk Factors  Considerations  Medical Comorbidities:  Eliquis  Anticoagulation: (Stop: 3 days  Restart: 6 hours)  A-Fib  BA  IDDM w/ hx. Coma  steroid-induced MD  Autoimmune hepatitis  liver cirrhosis  Hx. Portal Encephalopathy  Portal-HTN  GERD  Esophageal varices  Hx. Adrenal insuficiency  SLE (Lupus)  dilated cardiomyopathy  Thrombocytopoenia  BA     Planned  Pending:   Diagnostic/therapeutic right L1-2 LESI #1    Under consideration:  (Avoid Steroids 2ry to DM) (if possible) Therapeutic left ESM & QLM MNB/TPI #2 (w/o steroids)  Therapeutic left lumbar facet RFA #2  Therapeutic  L5-S1 Intracept procedure    Completed:   Diagnostic/therapeutic left ESM & QLM MNB/TPI x1 (10/20/2023) (100/100/85/75)  Therapeutic left lumbar facet (L2-S1) MB RFA x1 (09/15/2023) 100/100/85/75)  Diagnostic bilateral lumbar facet (L2-S1) MBB x2 (07/07/2023) (100/100/100/85)  Patient set up with the TENS 7000 unit (11/04/2023)    Therapeutic  Palliative (PRN) options:   None established   Completed by other providers:   Therapeutic left sacroiliac joint inj. x3 (03/29/2015, 04/16/2020, 09/08/2022) by Franky Curtistine Ambrosia, DO [facility])  Therapeutic knee injections by orthopedics Therapeutic shoulder injections by [facility]      Follow-up plan:   Return in about 2 weeks (around 06/14/2024) for (Face2F), (PPE).     Recent Visits Date Type Provider Dept  04/27/24 Office Visit Tanya Glisson, MD [facility]  Showing recent visits within past 90 days and meeting all other requirements Today's Visits Date Type Provider Dept  05/31/24 Procedure visit Tanya Glisson, MD [facility]  Showing today's visits and meeting all other requirements Future Appointments Date Type Provider Dept  06/20/24 Appointment Tanya Glisson, MD [facility]  Showing future appointments within next 90 days and meeting all other requirements   Disposition: Discharge home  Discharge (Date  Time): 05/31/2024; 1301 hrs.   Primary Care Physician: Sedalia Velma RAMAN, MD Location: [facility] Note by: Glisson DELENA Tanya, MD (TTS technology used. I apologize for  any typographical errors that were not detected and corrected.) Date: 05/31/2024; Time: 1:11 PM  Disclaimer:  Medicine is not an visual merchandiser. The only guarantee in medicine is that nothing is guaranteed. It is important to note that the decision to proceed with this intervention was based on the information collected from the patient. The Data and conclusions were drawn from  the patient's questionnaire, the interview, and the physical examination. Because the information was provided in large part by the patient, it cannot be guaranteed that it has not been purposely or unconsciously manipulated. Every effort has been made to obtain as much relevant data as possible for this evaluation. It is important to note that the conclusions that lead to this procedure are derived in large part from the available data. Always take into account that the treatment will also be dependent on availability of resources and existing treatment guidelines, considered by other Pain Management Practitioners as being common knowledge and practice, at the time of the intervention. For Medico-Legal purposes, it is also important to point out that variation in procedural techniques and pharmacological choices are the acceptable norm. The indications, contraindications, technique, and results of the above procedure should only be interpreted and judged by a Board-Certified Interventional Pain Specialist with extensive familiarity and expertise in the same exact procedure and technique.

## 2024-05-31 NOTE — Patient Instructions (Signed)
 ______________________________________________________________________    Blood Thinners  IMPORTANT NOTICE:  If you take any of these, make sure to notify the nursing staff.  Failure to do so may result in serious injury.  Recommended time intervals to stop and restart blood-thinners, before & after invasive procedures  Generic Name Brand Name Pre-procedure: Stop medication for this amount of time before your procedure: Post-procedure: Wait this amount of time after the procedure before restarting your medication:  Abciximab Reopro 15 days 2 hrs  Alteplase Activase 10 days 10 days  Anagrelide Agrylin    Apixaban Eliquis 3 days 6 hrs  Cilostazol Pletal 3 days 5 hrs  Clopidogrel  Plavix  7-10 days 2 hrs  Dabigatran Pradaxa 5 days 6 hrs  Dalteparin Fragmin 24 hours 4 hrs  Dipyridamole Aggrenox 11days 2 hrs  Edoxaban Lixiana; Savaysa 3 days 2 hrs  Enoxaparin   Lovenox  24 hours 4 hrs  Eptifibatide Integrillin 8 hours 2 hrs  Fondaparinux  Arixtra 72 hours 12 hrs  Hydroxychloroquine Plaquenil 11 days   Prasugrel Effient 7-10 days 6 hrs  Reteplase Retavase 10 days 10 days  Rivaroxaban Xarelto 3 days 6 hrs  Ticagrelor Brilinta 5-7 days 6 hrs  Ticlopidine Ticlid 10-14 days 2 hrs  Tinzaparin Innohep 24 hours 4 hrs  Tirofiban Aggrastat 8 hours 2 hrs  Warfarin Coumadin 5 days 2 hrs   Other medications with blood-thinning effects  NOTE: Consider stopping these if you have prolonged bleeding despite not taking any of the above blood thinners. Otherwise ask your provider and this will be decided on a case-by-case basis.  Product indications Generic (Brand) names Note  Cholesterol Lipitor Stop 4 days before procedure  Blood thinner (injectable) Heparin  (LMW or LMWH Heparin ) Stop 24 hours before procedure  Cancer Ibrutinib (Imbruvica) Stop 7 days before procedure  Malaria/Rheumatoid Hydroxychloroquine (Plaquenil) Stop 11 days before procedure  Thrombolytics  10 days before or after procedures    Over-the-counter (OTC) Products with blood-thinning effects  NOTE: Consider stopping these if you have prolonged bleeding despite not taking any of the above blood thinners. Otherwise ask your provider and this will be decided on a case-by-case basis.  Product Common names Stop Time  Aspirin  > 325 mg Goody Powders, Excedrin, etc. 11 days  Aspirin  <= 81 mg  7 days  Fish oil  4 days  Garlic supplements  7 days  Ginkgo biloba  36 hours  Ginseng  24 hours  NSAIDs Ibuprofen , Naprosyn , etc. 3 days  Vitamin E  4 days   ______________________________________________________________________     ______________________________________________________________________    Post-Procedure Discharge Instructions  INSTRUCTIONS Apply ice:  Purpose: This will minimize any swelling and discomfort after procedure.  When: Day of procedure, as soon as you get home. How: Fill a plastic sandwich bag with crushed ice. Cover it with a small towel and apply to injection site. How long: (15 min on, 15 min off) Apply for 15 minutes then remove x 15 minutes.  Repeat sequence on day of procedure, until you go to bed. Apply heat:  Purpose: To treat any soreness and discomfort from the procedure. When: Starting the next day after the procedure. How: Apply heat to procedure site starting the day following the procedure. How long: May continue to repeat daily, until discomfort goes away. Food intake: Start with clear liquids (like water) and advance to regular food, as tolerated.  Physical activities: Keep activities to a minimum for the first 8 hours after the procedure. After that, then as tolerated. Driving: If you have received  any sedation, be responsible and do not drive. You are not allowed to drive for 24 hours after having sedation. Blood thinner: (Applies only to those taking blood thinners) You may restart your blood thinner 6 hours after your procedure. Insulin : (Applies only to Diabetic patients taking  insulin ) As soon as you can eat, you may resume your normal dosing schedule. Infection prevention: Keep procedure site clean and dry. Shower daily and clean area with soap and water.  PAIN DIARY Post-procedure Pain Diary: Extremely important that this be done correctly and accurately. Recorded information will be used to determine the next step in treatment. For the purpose of accuracy, follow these rules: Evaluate only the area treated. Do not report or include pain from an untreated area. For the purpose of this evaluation, ignore all other areas of pain, except for the treated area. After your procedure, avoid taking a long nap and attempting to complete the pain diary after you wake up. Instead, set your alarm clock to go off every hour, on the hour, for the initial 8 hours after the procedure. Document the duration of the numbing medicine, and the relief you are getting from it. Do not go to sleep and attempt to complete it later. It will not be accurate. If you received sedation, it is likely that you were given a medication that may cause amnesia. Because of this, completing the diary at a later time may cause the information to be inaccurate. This information is needed to plan your care. Follow-up appointment: Keep your post-procedure follow-up evaluation appointment after the procedure (usually 2 weeks for most procedures, 6 weeks for radiofrequencies). DO NOT FORGET to bring you pain diary with you.   EXPECT... (What should I expect to see with my procedure?) From numbing medicine (AKA: Local Anesthetics): Numbness or decrease in pain. You may also experience some weakness, which if present, could last for the duration of the local anesthetic. Onset: Full effect within 15 minutes of injected. Duration: It will depend on the type of local anesthetic used. On the average, 1 to 8 hours.  From steroids (Applies only if steroids were used): Decrease in swelling or inflammation. Once inflammation  is improved, relief of the pain will follow. Onset of benefits: Depends on the amount of swelling present. The more swelling, the longer it will take for the benefits to be seen. In some cases, up to 10 days. Duration: Steroids will stay in the system x 2 weeks. Duration of benefits will depend on multiple posibilities including persistent irritating factors. Side-effects: If present, they may typically last 2 weeks (the duration of the steroids). Frequent: Cramps (if they occur, drink Gatorade and take over-the-counter Magnesium  450-500 mg once to twice a day); water retention with temporary weight gain; increases in blood sugar; decreased immune system response; increased appetite. Occasional: Facial flushing (red, warm cheeks); mood swings; menstrual changes. Uncommon: Long-term decrease or suppression of natural hormones; bone thinning. (These are more common with higher doses or more frequent use. This is why we prefer that our patients avoid having any injection therapies in other practices.)  Very Rare: Severe mood changes; psychosis; aseptic necrosis. From procedure: Some discomfort is to be expected once the numbing medicine wears off. This should be minimal if ice and heat are applied as instructed.  CALL IF... (When should I call?) You experience numbness and weakness that gets worse with time, as opposed to wearing off. New onset bowel or bladder incontinence. (Applies only to procedures done in the  spine)  Emergency Numbers: Durning business hours (Monday - Thursday, 8:00 AM - 4:00 PM) (Friday, 9:00 AM - 12:00 Noon): [phone] After hours: [phone] NOTE: If you are having a problem and are unable connect with, or to talk to a provider, then go to your nearest urgent care or emergency department. If the problem is serious and urgent, please call 911. ______________________________________________________________________      ______________________________________________________________________    Steroid injections  Common steroids for injections Triamcinolone : Used by many sports medicine physicians for large joint and bursal injections, often combined with a local anesthetic like lidocaine . A study focusing on coccydynia (tailbone pain) found triamcinolone  was more effective than betamethasone, suggesting it may also be preferable for other localized inflammation conditions. Methylprednisolone : A common alternative to triamcinolone  that is also a strong anti-inflammatory. It is available in different formulations, with the acetate suspension being the long-acting option for intra-articular injections. Dexamethasone : This is a non-particulate steroid, meaning it has a lower risk of tissue damage compared to particulate steroids like triamcinolone  and methylprednisolone . While less common for this specific use, it is an option for targeted injections.   Considerations for physicians Particulate vs. non-particulate steroids: Triamcinolone  and methylprednisolone  are particulate, meaning they can clump together. Dexamethasone  is non-particulate. Particulate steroids are often preferred for their longer-lasting effects but carry a theoretical higher risk for certain injections (though this is less of a concern in the costochondral joints). Combined injectate: Corticosteroids are typically mixed with a local anesthetic like lidocaine  to provide both immediate pain relief (from the anesthetic) and longer-term inflammation reduction (from the steroid). Imaging guidance: To ensure accurate placement of the needle and medication, physicians may use ultrasound or fluoroscopic guidance for the injection, especially in complex or refractory cases.   Patient guidance Before undergoing a steroid injection, discuss the options with your physician. They will determine the best steroid, dosage, and procedure for your specific case  based on factors like: Severity of your condition History of response to other treatments Your overall health status Experience and preference of the physician  Last  Updated: 01/05/2024 ______________________________________________________________________

## 2024-06-01 ENCOUNTER — Telehealth: Payer: Self-pay

## 2024-06-01 NOTE — Telephone Encounter (Signed)
 No issues post-procedure.

## 2024-06-20 ENCOUNTER — Ambulatory Visit: Admitting: Pain Medicine
